# Patient Record
Sex: Female | Born: 1935 | Race: White | Hispanic: No | Marital: Married | Smoking: Never smoker
Health system: Southern US, Community
[De-identification: ages and names within clinical notes are randomized; demographics above are authoritative.]

## PROBLEM LIST (undated history)

## (undated) DIAGNOSIS — E119 Type 2 diabetes mellitus without complications: Secondary | ICD-10-CM

## (undated) DIAGNOSIS — H409 Unspecified glaucoma: Secondary | ICD-10-CM

## (undated) DIAGNOSIS — I519 Heart disease, unspecified: Secondary | ICD-10-CM

## (undated) DIAGNOSIS — E041 Nontoxic single thyroid nodule: Secondary | ICD-10-CM

## (undated) DIAGNOSIS — G459 Transient cerebral ischemic attack, unspecified: Secondary | ICD-10-CM

## (undated) DIAGNOSIS — K219 Gastro-esophageal reflux disease without esophagitis: Secondary | ICD-10-CM

## (undated) DIAGNOSIS — Z9889 Other specified postprocedural states: Secondary | ICD-10-CM

## (undated) DIAGNOSIS — Q211 Atrial septal defect: Secondary | ICD-10-CM

## (undated) DIAGNOSIS — Z8489 Family history of other specified conditions: Secondary | ICD-10-CM

## (undated) DIAGNOSIS — G43909 Migraine, unspecified, not intractable, without status migrainosus: Secondary | ICD-10-CM

## (undated) DIAGNOSIS — R112 Nausea with vomiting, unspecified: Secondary | ICD-10-CM

## (undated) DIAGNOSIS — I639 Cerebral infarction, unspecified: Secondary | ICD-10-CM

## (undated) DIAGNOSIS — I10 Essential (primary) hypertension: Secondary | ICD-10-CM

## (undated) DIAGNOSIS — R413 Other amnesia: Principal | ICD-10-CM

## (undated) DIAGNOSIS — I82409 Acute embolism and thrombosis of unspecified deep veins of unspecified lower extremity: Secondary | ICD-10-CM

## (undated) DIAGNOSIS — M1990 Unspecified osteoarthritis, unspecified site: Secondary | ICD-10-CM

## (undated) DIAGNOSIS — I253 Aneurysm of heart: Secondary | ICD-10-CM

## (undated) DIAGNOSIS — H269 Unspecified cataract: Secondary | ICD-10-CM

## (undated) DIAGNOSIS — E785 Hyperlipidemia, unspecified: Secondary | ICD-10-CM

## (undated) HISTORY — DX: Hyperlipidemia, unspecified: E78.5

## (undated) HISTORY — DX: Aneurysm of heart: I25.3

## (undated) HISTORY — DX: Other amnesia: R41.3

## (undated) HISTORY — PX: LUMBAR DISC SURGERY: SHX700

## (undated) HISTORY — DX: Essential (primary) hypertension: I10

## (undated) HISTORY — PX: BACK SURGERY: SHX140

## (undated) HISTORY — DX: Atrial septal defect: Q21.1

## (undated) HISTORY — PX: TUBAL LIGATION: SHX77

## (undated) HISTORY — PX: TONSILLECTOMY: SUR1361

## (undated) HISTORY — DX: Gastro-esophageal reflux disease without esophagitis: K21.9

## (undated) HISTORY — PX: COLONOSCOPY W/ BIOPSIES AND POLYPECTOMY: SHX1376

## (undated) HISTORY — DX: Transient cerebral ischemic attack, unspecified: G45.9

## (undated) HISTORY — DX: Heart disease, unspecified: I51.9

---

## 1998-02-25 ENCOUNTER — Other Ambulatory Visit: Admission: RE | Admit: 1998-02-25 | Discharge: 1998-02-25 | Payer: Self-pay | Admitting: *Deleted

## 1999-02-08 ENCOUNTER — Other Ambulatory Visit: Admission: RE | Admit: 1999-02-08 | Discharge: 1999-02-08 | Payer: Self-pay | Admitting: *Deleted

## 1999-02-11 ENCOUNTER — Encounter: Payer: Self-pay | Admitting: *Deleted

## 1999-02-11 ENCOUNTER — Ambulatory Visit (HOSPITAL_COMMUNITY): Admission: RE | Admit: 1999-02-11 | Discharge: 1999-02-11 | Payer: Self-pay | Admitting: *Deleted

## 2000-02-21 ENCOUNTER — Encounter: Payer: Self-pay | Admitting: *Deleted

## 2000-02-21 ENCOUNTER — Ambulatory Visit (HOSPITAL_COMMUNITY): Admission: RE | Admit: 2000-02-21 | Discharge: 2000-02-21 | Payer: Self-pay | Admitting: Family Medicine

## 2000-03-21 ENCOUNTER — Encounter: Admission: RE | Admit: 2000-03-21 | Discharge: 2000-06-19 | Payer: Self-pay | Admitting: Family Medicine

## 2001-02-22 ENCOUNTER — Encounter: Payer: Self-pay | Admitting: *Deleted

## 2001-02-22 ENCOUNTER — Ambulatory Visit (HOSPITAL_COMMUNITY): Admission: RE | Admit: 2001-02-22 | Discharge: 2001-02-22 | Payer: Self-pay | Admitting: *Deleted

## 2002-02-19 ENCOUNTER — Emergency Department (HOSPITAL_COMMUNITY): Admission: EM | Admit: 2002-02-19 | Discharge: 2002-02-19 | Payer: Self-pay | Admitting: Emergency Medicine

## 2002-02-19 ENCOUNTER — Encounter: Payer: Self-pay | Admitting: Emergency Medicine

## 2002-03-28 ENCOUNTER — Encounter: Payer: Self-pay | Admitting: *Deleted

## 2002-03-28 ENCOUNTER — Ambulatory Visit (HOSPITAL_COMMUNITY): Admission: RE | Admit: 2002-03-28 | Discharge: 2002-03-28 | Payer: Self-pay | Admitting: *Deleted

## 2002-04-15 ENCOUNTER — Other Ambulatory Visit: Admission: RE | Admit: 2002-04-15 | Discharge: 2002-04-15 | Payer: Self-pay | Admitting: *Deleted

## 2003-06-23 ENCOUNTER — Encounter: Admission: RE | Admit: 2003-06-23 | Discharge: 2003-06-23 | Payer: Self-pay | Admitting: Family Medicine

## 2003-08-25 ENCOUNTER — Other Ambulatory Visit: Admission: RE | Admit: 2003-08-25 | Discharge: 2003-08-25 | Payer: Self-pay | Admitting: Family Medicine

## 2004-05-31 ENCOUNTER — Encounter: Admission: RE | Admit: 2004-05-31 | Discharge: 2004-05-31 | Payer: Self-pay | Admitting: *Deleted

## 2004-10-05 ENCOUNTER — Ambulatory Visit (HOSPITAL_COMMUNITY): Admission: RE | Admit: 2004-10-05 | Discharge: 2004-10-05 | Payer: Self-pay | Admitting: Family Medicine

## 2006-06-12 ENCOUNTER — Ambulatory Visit (HOSPITAL_COMMUNITY): Admission: RE | Admit: 2006-06-12 | Discharge: 2006-06-12 | Payer: Self-pay | Admitting: Family Medicine

## 2007-09-06 ENCOUNTER — Ambulatory Visit (HOSPITAL_COMMUNITY): Admission: RE | Admit: 2007-09-06 | Discharge: 2007-09-06 | Payer: Self-pay | Admitting: Family Medicine

## 2009-03-19 ENCOUNTER — Encounter: Admission: RE | Admit: 2009-03-19 | Discharge: 2009-03-19 | Payer: Self-pay | Admitting: Internal Medicine

## 2009-04-27 ENCOUNTER — Encounter (INDEPENDENT_AMBULATORY_CARE_PROVIDER_SITE_OTHER): Payer: Self-pay | Admitting: Interventional Radiology

## 2009-04-27 ENCOUNTER — Encounter: Admission: RE | Admit: 2009-04-27 | Discharge: 2009-04-27 | Payer: Self-pay | Admitting: Internal Medicine

## 2009-04-27 ENCOUNTER — Other Ambulatory Visit: Admission: RE | Admit: 2009-04-27 | Discharge: 2009-04-27 | Payer: Self-pay | Admitting: Interventional Radiology

## 2009-08-23 ENCOUNTER — Ambulatory Visit (HOSPITAL_COMMUNITY): Admission: RE | Admit: 2009-08-23 | Discharge: 2009-08-23 | Payer: Self-pay | Admitting: Family Medicine

## 2009-09-30 ENCOUNTER — Encounter: Admission: RE | Admit: 2009-09-30 | Discharge: 2009-09-30 | Payer: Self-pay | Admitting: Internal Medicine

## 2009-12-20 ENCOUNTER — Ambulatory Visit: Payer: Self-pay | Admitting: Gastroenterology

## 2010-06-10 ENCOUNTER — Ambulatory Visit: Payer: Self-pay | Admitting: Gastroenterology

## 2010-06-14 ENCOUNTER — Ambulatory Visit: Payer: Self-pay | Admitting: Gastroenterology

## 2010-06-16 LAB — PATHOLOGY REPORT

## 2010-08-17 ENCOUNTER — Encounter
Admission: RE | Admit: 2010-08-17 | Discharge: 2010-08-17 | Payer: Self-pay | Source: Home / Self Care | Attending: Internal Medicine | Admitting: Internal Medicine

## 2010-09-07 ENCOUNTER — Other Ambulatory Visit (HOSPITAL_COMMUNITY): Payer: Self-pay | Admitting: Family Medicine

## 2010-09-07 DIAGNOSIS — Z1231 Encounter for screening mammogram for malignant neoplasm of breast: Secondary | ICD-10-CM

## 2010-09-14 ENCOUNTER — Ambulatory Visit (HOSPITAL_COMMUNITY): Payer: Medicare Other

## 2010-09-21 ENCOUNTER — Other Ambulatory Visit (HOSPITAL_COMMUNITY): Payer: Self-pay | Admitting: Internal Medicine

## 2010-09-21 DIAGNOSIS — E042 Nontoxic multinodular goiter: Secondary | ICD-10-CM

## 2010-09-29 ENCOUNTER — Ambulatory Visit: Payer: Self-pay | Admitting: Gastroenterology

## 2010-09-30 ENCOUNTER — Ambulatory Visit: Payer: Self-pay | Admitting: Gastroenterology

## 2010-10-03 ENCOUNTER — Ambulatory Visit (HOSPITAL_COMMUNITY): Payer: Medicare Other

## 2010-10-04 ENCOUNTER — Other Ambulatory Visit (HOSPITAL_COMMUNITY): Payer: Medicare Other

## 2011-02-15 ENCOUNTER — Other Ambulatory Visit (HOSPITAL_COMMUNITY): Payer: Self-pay | Admitting: Internal Medicine

## 2011-02-15 DIAGNOSIS — E042 Nontoxic multinodular goiter: Secondary | ICD-10-CM

## 2011-02-28 ENCOUNTER — Encounter (HOSPITAL_COMMUNITY)
Admission: RE | Admit: 2011-02-28 | Discharge: 2011-02-28 | Disposition: A | Discharge status: Home / Self Care | Payer: Medicare Other | Source: Ambulatory Visit | Attending: Internal Medicine | Admitting: Internal Medicine

## 2011-02-28 DIAGNOSIS — E042 Nontoxic multinodular goiter: Secondary | ICD-10-CM | POA: Insufficient documentation

## 2011-03-01 ENCOUNTER — Encounter (HOSPITAL_COMMUNITY)
Admission: RE | Admit: 2011-03-01 | Discharge: 2011-03-01 | Disposition: A | Discharge status: Home / Self Care | Payer: Medicare Other | Source: Ambulatory Visit | Attending: Internal Medicine | Admitting: Internal Medicine

## 2011-03-01 MED ORDER — SODIUM IODIDE I 131 CAPSULE
10.0000 | Freq: Once | INTRAVENOUS | Status: AC | PRN
  Administered 2011-03-01: 10 via ORAL

## 2011-03-01 MED ORDER — SODIUM PERTECHNETATE TC 99M INJECTION
10.0000 | Freq: Once | INTRAVENOUS | Status: AC | PRN
  Administered 2011-03-01: 10 via INTRAVENOUS

## 2011-05-11 ENCOUNTER — Emergency Department: Payer: Self-pay | Admitting: *Deleted

## 2011-10-13 ENCOUNTER — Encounter: Payer: Self-pay | Admitting: Cardiovascular Disease

## 2011-10-13 HISTORY — PX: US ECHOCARDIOGRAPHY: HXRAD669

## 2011-10-26 ENCOUNTER — Ambulatory Visit (HOSPITAL_COMMUNITY): Payer: Medicare Other | Attending: Cardiovascular Disease

## 2011-10-26 ENCOUNTER — Encounter: Payer: Self-pay | Admitting: *Deleted

## 2011-10-26 ENCOUNTER — Encounter (HOSPITAL_COMMUNITY): Payer: Self-pay

## 2011-10-26 ENCOUNTER — Other Ambulatory Visit: Payer: Self-pay

## 2011-10-26 ENCOUNTER — Ambulatory Visit (INDEPENDENT_AMBULATORY_CARE_PROVIDER_SITE_OTHER): Payer: Medicare Other | Admitting: Cardiovascular Disease

## 2011-10-26 ENCOUNTER — Other Ambulatory Visit (HOSPITAL_COMMUNITY): Payer: Self-pay | Admitting: Radiology

## 2011-10-26 ENCOUNTER — Encounter (HOSPITAL_COMMUNITY): Payer: Self-pay | Admitting: Cardiovascular Disease

## 2011-10-26 VITALS — BP 142/79 | HR 69 | Ht 65.0 in | Wt 146.8 lb

## 2011-10-26 DIAGNOSIS — Q211 Atrial septal defect: Secondary | ICD-10-CM | POA: Insufficient documentation

## 2011-10-26 DIAGNOSIS — I10 Essential (primary) hypertension: Secondary | ICD-10-CM | POA: Insufficient documentation

## 2011-10-26 DIAGNOSIS — Q2111 Secundum atrial septal defect: Secondary | ICD-10-CM

## 2011-10-26 DIAGNOSIS — E119 Type 2 diabetes mellitus without complications: Secondary | ICD-10-CM | POA: Insufficient documentation

## 2011-10-26 DIAGNOSIS — I253 Aneurysm of heart: Secondary | ICD-10-CM

## 2011-10-26 DIAGNOSIS — R55 Syncope and collapse: Secondary | ICD-10-CM | POA: Insufficient documentation

## 2011-10-26 DIAGNOSIS — E785 Hyperlipidemia, unspecified: Secondary | ICD-10-CM | POA: Insufficient documentation

## 2011-10-26 NOTE — Assessment & Plan Note (Addendum)
I reviewed the patient's echo images. She does have a very hypermobile interatrial septum consistent with an atrial septal aneurysm. There is an association between atrial septal aneurysm and stroke. This has to be considered in the context of the pace at age of 77 years old as well as her concurrent medical problems of diabetes and hypertension, both of which increase her risk of stroke. Fortunately she had no evidence of an embolic event on her MRI. I had the patient undergo a limited echo with bubble study to assess whether there may be intracardiac right-to-left shunting, and the study was positive with early bubbles entering the left heart. Because of this finding, I think the patient should undergo a transesophageal echocardiogram to rule out an atrial septal defect. If she only has a small PFO, I would not plan on any further intervention and would continue with medical therapy. However, she has a significant defect in her interatrial septum, we'll need to consider further treatment. Further plans pending the result of her TEE. I appreciate the opportunity to see this nice patient. I recommended that she continue her current medicines without changes.

## 2011-10-26 NOTE — Progress Notes (Signed)
HPI:  This is a very nice 77 year old woman referred for evaluation of an atrial septal aneurysm. Approximately 2 weeks ago the patient was in her normal state of health until she had an abrupt episode of confusion. She had woken not been taking a shower, then walked downstairs to her daughter's bedroom. That is lasting the patient remembers. Her husband states that the patient was sitting down with unintelligible speech. She did not have focal weakness or clumsiness, nor did she have a facial droop or lose consciousness. She was taken to the hospital and the first thing she remembers is waking up with hospital staff talking to her. She has never had an episode like this in the past. She has no history of stroke or TIA. She reports no visual disturbance. She does complain of a dull left frontal headache ever since the episode occurred. She's had no chest pain, palpitations, dyspnea, lightheadedness, or syncope. The patient has no history of cardiovascular disease, but she does have hypertension and diabetes. An MRI and MRA will was done as part of her evaluation and this showed no significant abnormalities. The patient had a 2-D echocardiogram performed and this demonstrated an atrial septal aneurysm. There were no valvular abnormalities or significant LV dysfunction seen. She presents today for further evaluation of her atrial septal aneurysm considering her recent neurologic event suspicious for a TIA. Of note, the patient has been taking aspirin regularly for many years.  Outpatient Encounter Prescriptions as of 10/26/2011  Medication Sig Dispense Refill  . aspirin 325 MG tablet Take 325 mg by mouth daily.      . Insulin Aspart (NOVOLOG [place]) Inject 12 Units into the skin 3 (three) times daily.      . Insulin Glargine (LANTUS [place]) Inject 25 Units into the skin daily.      Marland Kitchen lisinopril-hydrochlorothiazide (PRINZIDE,ZESTORETIC) 20-25 MG per tablet Take 1 tablet by mouth daily.      . Multiple  Vitamins-Minerals (MULTIVITAMIN PO) Take by mouth daily.      . pantoprazole (PROTONIX) 40 MG tablet Take 40 mg by mouth daily.      . simvastatin (ZOCOR) 40 MG tablet Take 40 mg by mouth every evening.      . ONE TOUCH ULTRA TEST test strip       . POTASSIUM PO Take by mouth daily.        Codeine  Past Medical History  Diagnosis Date  . Transient ischemic attack   . HTN (hypertension)   . Hyperlipidemia   . DM (diabetes mellitus)   . GERD (gastroesophageal reflux disease)     Past Surgical History  Procedure Date  . US echocardiography 10-13-11    EF 60-65% normal  . Back surgery     disc   . Tonsillectomy     History   Social History  . Marital Status: Married    Spouse Name: N/A    Number of Children: N/A  . Years of Education: N/A   Occupational History  . Not on file.   Social History Main Topics  . Smoking status: Never Smoker   . Smokeless tobacco: Not on file  . Alcohol Use:   . Drug Use:   . Sexually Active:    Other Topics Concern  . Not on file   Social History Narrative  . No narrative on file    History reviewed. No pertinent family history.  ROS: General: no fevers/chills/night sweats. Positive for generalized fatigue and positive for high stress  Eyes: no blurry vision, diplopia, or amaurosis ENT: no sore throat or hearing loss Resp: no cough, wheezing, or hemoptysis CV: no edema or palpitations GI: no abdominal pain, nausea, vomiting, diarrhea, or constipation GU: no dysuria, frequency, or hematuria Skin: no rash Neuro: numbness, tingling, or weakness of extremities. Positive for headache Musculoskeletal: no joint pain or swelling Heme: no bleeding, DVT, or easy bruising Endo: no polydipsia or polyuria  BP 142/79  Pulse 69  Ht 5\' 5"  (1.651 m)  Wt 66.588 kg (146 lb 12.8 oz)  BMI 24.43 kg/m2  PHYSICAL EXAM: Pt is alert and oriented, WD, WN, in no distress. HEENT: normal Neck: JVP normal. Carotid upstrokes normal without bruits.  No thyromegaly. Lungs: equal expansion, clear bilaterally CV: Apex is discrete and nondisplaced, RRR without murmur or gallop Abd: soft, NT, +BS, no bruit, no hepatosplenomegaly Back: no CVA tenderness Ext: no C/C/E        Femoral pulses 2+= without bruits        DP/PT pulses intact and = Skin: warm and dry without rash Neuro: CNII-XII intact             Strength intact = bilaterally  EKG:  Normal sinus rhythm heart rate 65 beats per minute, low voltage QRS, otherwise within normal limits.  ASSESSMENT AND PLAN:

## 2011-10-26 NOTE — Progress Notes (Signed)
Patient ID: Christy Simmons, female   DOB: [birth year], 77 y.o.   MRN: [id number] IV 22G (R) hand x 1, tolerated well for Echo Bubble Study. Gratia Disla,RN.

## 2011-10-26 NOTE — Patient Instructions (Signed)
Your physician has requested that you have an echocardiogram. Echocardiography is a painless test that uses sound waves to create images of your heart. It provides your doctor with information about the size and shape of your heart and how well your heart's chambers and valves are working. This procedure takes approximately one hour. There are no restrictions for this procedure.   

## 2011-10-26 NOTE — Progress Notes (Signed)
This encounter was created in error - please disregard.

## 2011-10-30 ENCOUNTER — Encounter (HOSPITAL_COMMUNITY): Payer: Self-pay | Admitting: *Deleted

## 2011-10-30 ENCOUNTER — Ambulatory Visit (HOSPITAL_COMMUNITY)
Admission: RE | Admit: 2011-10-30 | Discharge: 2011-10-30 | Disposition: A | Discharge status: Home / Self Care | Payer: Medicare Other | Source: Ambulatory Visit | Attending: Cardiology | Admitting: Cardiology

## 2011-10-30 ENCOUNTER — Encounter (HOSPITAL_COMMUNITY)
Admission: RE | Disposition: A | Discharge status: Home / Self Care | Payer: Self-pay | Source: Ambulatory Visit | Attending: Cardiology

## 2011-10-30 DIAGNOSIS — E785 Hyperlipidemia, unspecified: Secondary | ICD-10-CM | POA: Insufficient documentation

## 2011-10-30 DIAGNOSIS — G459 Transient cerebral ischemic attack, unspecified: Secondary | ICD-10-CM | POA: Insufficient documentation

## 2011-10-30 DIAGNOSIS — E119 Type 2 diabetes mellitus without complications: Secondary | ICD-10-CM | POA: Insufficient documentation

## 2011-10-30 DIAGNOSIS — I10 Essential (primary) hypertension: Secondary | ICD-10-CM | POA: Insufficient documentation

## 2011-10-30 DIAGNOSIS — I6789 Other cerebrovascular disease: Secondary | ICD-10-CM

## 2011-10-30 DIAGNOSIS — K219 Gastro-esophageal reflux disease without esophagitis: Secondary | ICD-10-CM | POA: Insufficient documentation

## 2011-10-30 HISTORY — PX: TEE WITHOUT CARDIOVERSION: SHX5443

## 2011-10-30 LAB — GLUCOSE, CAPILLARY

## 2011-10-30 SURGERY — ECHOCARDIOGRAM, TRANSESOPHAGEAL
Anesthesia: Moderate Sedation

## 2011-10-30 MED ORDER — FENTANYL CITRATE 0.05 MG/ML IJ SOLN
INTRAMUSCULAR | Status: DC | PRN
  Administered 2011-10-30: 25 ug via INTRAVENOUS

## 2011-10-30 MED ORDER — SODIUM CHLORIDE 0.9 % IV SOLN
250.0000 mL | INTRAVENOUS | Status: DC | PRN
  Administered 2011-10-30: 500 mL via INTRAVENOUS

## 2011-10-30 MED ORDER — FENTANYL CITRATE 0.05 MG/ML IJ SOLN: 250.0000 ug | Freq: Once | INTRAMUSCULAR | Status: DC

## 2011-10-30 MED ORDER — FENTANYL CITRATE 0.05 MG/ML IJ SOLN
INTRAMUSCULAR | Status: AC
  Filled 2011-10-30: qty 2

## 2011-10-30 MED ORDER — SODIUM CHLORIDE 0.9 % IV SOLN: 250.0000 mL | INTRAVENOUS | Status: DC | PRN

## 2011-10-30 MED ORDER — SODIUM CHLORIDE 0.9 % IJ SOLN: 3.0000 mL | Freq: Two times a day (BID) | INTRAMUSCULAR | Status: DC

## 2011-10-30 MED ORDER — SODIUM CHLORIDE 0.9 % IJ SOLN: 3.0000 mL | INTRAMUSCULAR | Status: DC | PRN

## 2011-10-30 MED ORDER — MIDAZOLAM HCL 10 MG/2ML IJ SOLN: 10.0000 mg | Freq: Once | INTRAMUSCULAR | Status: DC

## 2011-10-30 MED ORDER — SODIUM CHLORIDE 0.9 % IJ SOLN
3.0000 mL | Freq: Two times a day (BID) | INTRAMUSCULAR | Status: DC
Start: 2011-10-30 — End: 2011-10-30

## 2011-10-30 MED ORDER — DEXTROSE 50 % IV SOLN
INTRAVENOUS | Status: AC
  Filled 2011-10-30: qty 50

## 2011-10-30 MED ORDER — DEXTROSE 50 % IV SOLN
25.0000 mL | Freq: Once | INTRAVENOUS | Status: AC
  Administered 2011-10-30: 25 mL via INTRAVENOUS

## 2011-10-30 MED ORDER — BENZOCAINE 20 % MT SOLN: 1.0000 "application " | OROMUCOSAL | Status: DC | PRN

## 2011-10-30 MED ORDER — BUTAMBEN-TETRACAINE-BENZOCAINE 2-2-14 % EX AERO
INHALATION_SPRAY | CUTANEOUS | Status: DC | PRN
  Administered 2011-10-30: 2 via TOPICAL

## 2011-10-30 MED ORDER — MIDAZOLAM HCL 10 MG/2ML IJ SOLN
INTRAMUSCULAR | Status: AC
  Filled 2011-10-30: qty 2

## 2011-10-30 MED ORDER — MIDAZOLAM HCL 10 MG/2ML IJ SOLN
INTRAMUSCULAR | Status: DC | PRN
  Administered 2011-10-30: 1 mg via INTRAVENOUS

## 2011-10-30 NOTE — Progress Notes (Signed)
  Echocardiogram Echocardiogram Transesophageal has been performed.  Emelia Loron A 10/30/2011, 3:20 PM

## 2011-10-30 NOTE — H&P (View-Only) (Signed)
HPI:  This is a very nice 77 year old woman referred for evaluation of an atrial septal aneurysm. Approximately 2 weeks ago the patient was in her normal state of health until she had an abrupt episode of confusion. She had woken not been taking a shower, then walked downstairs to her daughter's bedroom. That is lasting the patient remembers. Her husband states that the patient was sitting down with unintelligible speech. She did not have focal weakness or clumsiness, nor did she have a facial droop or lose consciousness. She was taken to the hospital and the first thing she remembers is waking up with hospital staff talking to her. She has never had an episode like this in the past. She has no history of stroke or TIA. She reports no visual disturbance. She does complain of a dull left frontal headache ever since the episode occurred. She's had no chest pain, palpitations, dyspnea, lightheadedness, or syncope. The patient has no history of cardiovascular disease, but she does have hypertension and diabetes. An MRI and MRA will was done as part of her evaluation and this showed no significant abnormalities. The patient had a 2-D echocardiogram performed and this demonstrated an atrial septal aneurysm. There were no valvular abnormalities or significant LV dysfunction seen. She presents today for further evaluation of her atrial septal aneurysm considering her recent neurologic event suspicious for a TIA. Of note, the patient has been taking aspirin regularly for many years.  Outpatient Encounter Prescriptions as of 10/26/2011  Medication Sig Dispense Refill  . aspirin 325 MG tablet Take 325 mg by mouth daily.      . Insulin Aspart (NOVOLOG [place]) Inject 12 Units into the skin 3 (three) times daily.      . Insulin Glargine (LANTUS [place]) Inject 25 Units into the skin daily.      Marland Kitchen lisinopril-hydrochlorothiazide (PRINZIDE,ZESTORETIC) 20-25 MG per tablet Take 1 tablet by mouth daily.      . Multiple  Vitamins-Minerals (MULTIVITAMIN PO) Take by mouth daily.      . pantoprazole (PROTONIX) 40 MG tablet Take 40 mg by mouth daily.      . simvastatin (ZOCOR) 40 MG tablet Take 40 mg by mouth every evening.      . ONE TOUCH ULTRA TEST test strip       . POTASSIUM PO Take by mouth daily.        Codeine  Past Medical History  Diagnosis Date  . Transient ischemic attack   . HTN (hypertension)   . Hyperlipidemia   . DM (diabetes mellitus)   . GERD (gastroesophageal reflux disease)     Past Surgical History  Procedure Date  . US echocardiography 10-13-11    EF 60-65% normal  . Back surgery     disc   . Tonsillectomy     History   Social History  . Marital Status: Married    Spouse Name: N/A    Number of Children: N/A  . Years of Education: N/A   Occupational History  . Not on file.   Social History Main Topics  . Smoking status: Never Smoker   . Smokeless tobacco: Not on file  . Alcohol Use:   . Drug Use:   . Sexually Active:    Other Topics Concern  . Not on file   Social History Narrative  . No narrative on file    History reviewed. No pertinent family history.  ROS: General: no fevers/chills/night sweats. Positive for generalized fatigue and positive for high stress  Eyes: no blurry vision, diplopia, or amaurosis ENT: no sore throat or hearing loss Resp: no cough, wheezing, or hemoptysis CV: no edema or palpitations GI: no abdominal pain, nausea, vomiting, diarrhea, or constipation GU: no dysuria, frequency, or hematuria Skin: no rash Neuro: numbness, tingling, or weakness of extremities. Positive for headache Musculoskeletal: no joint pain or swelling Heme: no bleeding, DVT, or easy bruising Endo: no polydipsia or polyuria  BP 142/79  Pulse 69  Ht 5\' 5"  (1.651 m)  Wt 66.588 kg (146 lb 12.8 oz)  BMI 24.43 kg/m2  PHYSICAL EXAM: Pt is alert and oriented, WD, WN, in no distress. HEENT: normal Neck: JVP normal. Carotid upstrokes normal without bruits.  No thyromegaly. Lungs: equal expansion, clear bilaterally CV: Apex is discrete and nondisplaced, RRR without murmur or gallop Abd: soft, NT, +BS, no bruit, no hepatosplenomegaly Back: no CVA tenderness Ext: no C/C/E        Femoral pulses 2+= without bruits        DP/PT pulses intact and = Skin: warm and dry without rash Neuro: CNII-XII intact             Strength intact = bilaterally  EKG:  Normal sinus rhythm heart rate 65 beats per minute, low voltage QRS, otherwise within normal limits.  ASSESSMENT AND PLAN:

## 2011-10-30 NOTE — Procedures (Signed)
Procedure: TEE  Indication: TIA, positive bubble study on TTE.   Sedation: 1 mg IV Versed, 25 mcg IV Fentanyl.   Findings: See report in echo section for full details.   Normal LV size and systolic function, EF 55-60%.  Normal RV size and systolic function.  There was an atrial septal aneurysm with a moderate to large PFO with bidirectional shunting.    Will discuss with Dr. Excell Seltzer.   No complications.

## 2011-10-30 NOTE — Interval H&P Note (Signed)
History and Physical Interval Note:  10/30/2011 2:29 PM  Christy Simmons  has presented today for surgery, with the diagnosis of positive bubble study in office   The various methods of treatment have been discussed with the patient and family. After consideration of risks, benefits and other options for treatment, the patient has consented to  Procedure(s) (LRB): TRANSESOPHAGEAL ECHOCARDIOGRAM (TEE) (N/A) as a surgical intervention .  The patients' history has been reviewed, patient examined, no change in status, stable for surgery.  I have reviewed the patients' chart and labs.  Questions were answered to the patient's satisfaction.     Danyale Ridinger [employer]

## 2011-10-30 NOTE — Discharge Instructions (Signed)
Transesophageal Echocardiography A transesophageal echocardiogram (TEE) is a special type of test that produces images of the heart by sound waves (echocardiogram). This type of echocardiogram can obtain better images of the heart than a standard echocardiogram. A TEE is done by passing a flexible tube down the esophagus. The heart is located in front of the esophagus. Because the heart and esophagus are close to one another, your caregiver can take very clear, detailed pictures of the heart via ultrasound waves. WHY HAVE A TEE? Your caregiver may need more information based on your medical condition. A TEE is usually performed due to the following:  Your caregiver needs more information based on standard echocardiogram findings.   If you had a stroke, this might have happened because a clot formed in your heart. A TEE can visualize different areas of the heart and check for clots.   To check valve anatomy and function. Your caregiver will especially look at the mitral valve.   To check for redness, soreness, and swelling (inflammation) on the inside lining of the heart (endocarditis).   To evaluate the dividing wall (septum) of the heart and presence of a hole that did not close after birth (patent foramen ovale, PFO).   To help diagnose a tear in the wall of the aorta (aortic dissection).   During cardiac valve surgery, a TEE probe is placed. This allows the surgeon to assess the valve repair before closing the chest.  LET YOUR CAREGIVER KNOW ABOUT:   Swallowing difficulties.   An esophageal obstruction.   Use of aspirin or antiplatelet therapy.  RISKS AND COMPLICATIONS  Though extremely rare, an esophageal tear (rupture) is a potential complication. BEFORE THE PROCEDURE   Arrive at least 1 hour before the procedure or as told by your caregiver.   Do not eat or drink for 6 hours before the procedure or as told by your caregiver.   An intravenous (IV) access tube will be started in  the arm.  PROCEDURE   A medicine to help you relax (sedative) will be given through the IV.   A medicine that numbs the area (local anesthetic) may be sprayed to the back of the throat.   Your blood pressure, heart rate, and breathing (vital signs) will be monitored during the procedure.   The TEE probe is a long, flexible tube. It is about the width of an adult female's index finger. The tip of the probe is placed into the back of the mouth and you will be asked to swallow. This helps to pass the tip of the probe into the esophagus. Once the tip of the probe is in the correct area, your caregiver can take pictures of the heart.   A TEE is usually not a painful procedure. You may feel the probe press against the back of the throat. The probe does not enter the trachea and does not affect your breathing.   Your time spent at the hospital is usually less than 2 hours.  AFTER THE PROCEDURE   You will be in bed, resting until you have fully returned to consciousness.   When you first awaken, your throat may feel slightly sore and will probably still feel numb. This will improve slowly over time.   You will not be allowed to eat or drink until it is clear that numbness has improved.   Once you have been able to drink, urinate, and sit on the edge of the bed without feeling sick to your stomach (nauseous)   or dizzy, you may be cleared to dress and go home.   Do not drive yourself home. You have had medications that can continue to make you feel drowsy and can impair your reflexes.   You should have a friend or family member with you for the next 24 hours after your examination.  Obtaining the test results It is your responsibility to obtain your test results. Ask the lab or department performing the test when and how you will get your results. SEEK IMMEDIATE MEDICAL CARE IF:   There is chest pain.   You have a hard time breathing or have shortness of breath.   You cough or throw up (vomit)  blood.  MAKE SURE YOU:   Understand these instructions.   Will watch this condition.   Will get help right away if you is not doing well or gets worse.  Document Released: 10/07/2002 Document Revised: 07/06/2011 Document Reviewed: 12/29/2008 [employer] Patient Information 2012 [employer]. 

## 2011-10-31 ENCOUNTER — Encounter (HOSPITAL_COMMUNITY): Payer: Self-pay | Admitting: Cardiology

## 2011-11-01 ENCOUNTER — Telehealth: Payer: Self-pay | Admitting: Cardiovascular Disease

## 2011-11-01 NOTE — Telephone Encounter (Signed)
Please return call to patient regarding T.E.E, she can be reached at hm# [phone]

## 2011-11-01 NOTE — Telephone Encounter (Signed)
No answer at home number.

## 2011-11-01 NOTE — Telephone Encounter (Signed)
I reviewed the patient's TEE images. I agree that she has a large PFO with atrial septal aneurysm and bidirectional shunting at rest. I reviewed the results with her on the telephone. She has a strong desire to have the defect closed because of concerns over recurrent stroke risk. I will have her return next week for discussion of transcatheter closure.

## 2011-11-02 NOTE — Telephone Encounter (Signed)
I spoke with the pt and she would like to come into the office on 11/10/11 to discuss closure with Dr Excell Seltzer.

## 2011-11-02 NOTE — Telephone Encounter (Signed)
Left message to call back  

## 2011-11-02 NOTE — Telephone Encounter (Signed)
Fu call °Pt returning your call  °

## 2011-11-10 ENCOUNTER — Ambulatory Visit: Payer: Medicare Other | Admitting: Cardiovascular Disease

## 2011-11-10 ENCOUNTER — Encounter: Payer: Self-pay | Admitting: Cardiovascular Disease

## 2011-11-10 ENCOUNTER — Ambulatory Visit (INDEPENDENT_AMBULATORY_CARE_PROVIDER_SITE_OTHER): Payer: Medicare Other | Admitting: Cardiovascular Disease

## 2011-11-10 VITALS — BP 140/80 | HR 80 | Ht 65.0 in | Wt 150.0 lb

## 2011-11-10 DIAGNOSIS — Q211 Atrial septal defect: Secondary | ICD-10-CM

## 2011-11-10 MED ORDER — CLOPIDOGREL BISULFATE 75 MG PO TABS: 75.0000 mg | ORAL_TABLET | Freq: Every day | ORAL | Status: AC

## 2011-11-10 NOTE — Progress Notes (Signed)
   HPI:  77 year-old woman presents for follow-up evaluation. She had a TIA last month when she had abrupt onset of confusion and headache. As part of her workup, a 2D echo showed an atrial septal aneurysm. This was followed by a echo bubble study which was strongly positive for right-to-left intracardiac shunt. She has previously had a transcranial doppler bubble study which was strongly positive for intracardiac shunt. A TEE was performed on April 1st and showed a large PFO with bidirectional flow and positive bubble study. She returns for further discussion. She has had no further stroke or TIA symptoms. She has no chest pain, dyspnea, or other complaints.  Outpatient Encounter Prescriptions as of 11/10/2011  Medication Sig Dispense Refill  . aspirin 325 MG tablet Take 325 mg by mouth daily.      . Insulin Aspart (NOVOLOG [place]) Inject 12 Units into the skin 3 (three) times daily.      . Insulin Glargine (LANTUS [place]) Inject 25 Units into the skin daily.      Marland Kitchen lisinopril-hydrochlorothiazide (PRINZIDE,ZESTORETIC) 20-25 MG per tablet Take 1 tablet by mouth daily.      . Multiple Vitamins-Minerals (MULTIVITAMIN PO) Take by mouth daily.      . ONE TOUCH ULTRA TEST test strip       . pantoprazole (PROTONIX) 40 MG tablet Take 40 mg by mouth daily.      Marland Kitchen POTASSIUM PO Take by mouth daily.      . simvastatin (ZOCOR) 40 MG tablet Take 40 mg by mouth every evening.      . clopidogrel (PLAVIX) 75 MG tablet Take 1 tablet (75 mg total) by mouth daily.  30 tablet  6    Allergies  Allergen Reactions  . Codeine     Past Medical History  Diagnosis Date  . Transient ischemic attack   . HTN (hypertension)   . Hyperlipidemia   . DM (diabetes mellitus)   . GERD (gastroesophageal reflux disease)     ROS: Negative except as per HPI  BP 140/80  Pulse 80  Ht 5\' 5"  (1.651 m)  Wt 68.04 kg (150 lb)  BMI 24.96 kg/m2  PHYSICAL EXAM: Pt is alert and oriented, NAD HEENT: normal Neck: JVP - normal, carotids  2+= without bruits Lungs: CTA bilaterally CV: RRR without murmur or gallop Abd: soft, NT, Positive BS, no hepatomegaly Ext: no C/C/E, distal pulses intact and equal Skin: warm/dry no rash  ASSESSMENT AND PLAN:

## 2011-11-10 NOTE — Patient Instructions (Signed)
Dr Excell Seltzer has recommended PFO Closure.  Please follow instruction sheet provided.   Your physician recommends that you continue on your current medications as directed. Please refer to the Current Medication list given to you today.

## 2011-11-13 ENCOUNTER — Other Ambulatory Visit: Payer: Self-pay | Admitting: Cardiovascular Disease

## 2011-11-13 NOTE — Assessment & Plan Note (Signed)
I have reviewed the patient's TEE study images in detail. She has a large PFO with both left-to-right and right-to-left flow at rest. The bubble study is strongly positive and there is an associated atrial septal aneurysm. The patient and her family have a strong desire for the defect to be closed. I reviewed the data both for and against transcatheter PFO closure with them in depth, as well as the controversies in this area. They understand the association of PFO and stroke, but also that RCT's have failed to meet their primary endpoints. Despite that, this patient has a large defect and was on antiplatelet Rx with ASA and understandably she doesn't not wish to have a second event before undergoing closure. I believe transcatheter closure is appropriate under these circumstances in a patient who has failed medical therapy. I have reviewed the risks of transcatheter closure of an atrial septal communication including but not limited to infection, bleeding, cardiac perforation, tamponade, emergency surgery, embolization, stroke, MI, late device erosion, and death. She understands and agrees to proceed. She will start plavix prior to the procedure. All questions were answered.

## 2011-11-23 ENCOUNTER — Telehealth: Payer: Self-pay | Admitting: Cardiovascular Disease

## 2011-11-23 NOTE — Telephone Encounter (Signed)
I spoke with the pt and she would like to cancel her PFO closure on 12/04/11.  She said her daughter is in the hosptial and her husband has pneumonia and she feels it is not the right time for procedure.  I have contacted the cath lab and cancelled procedure.

## 2011-11-23 NOTE — Telephone Encounter (Signed)
Pt scheduled for surgery and has some questions

## 2011-11-27 ENCOUNTER — Other Ambulatory Visit: Payer: Medicare Other

## 2011-12-04 ENCOUNTER — Ambulatory Visit (HOSPITAL_COMMUNITY): Admit: 2011-12-04 | Payer: Medicare Other | Admitting: Cardiovascular Disease

## 2011-12-04 ENCOUNTER — Encounter (HOSPITAL_COMMUNITY): Payer: Self-pay

## 2011-12-04 SURGERY — PATENT FORAMEN OVALE CLOSURE
Anesthesia: LOCAL

## 2012-01-06 ENCOUNTER — Emergency Department: Payer: Self-pay | Admitting: *Deleted

## 2012-05-13 ENCOUNTER — Other Ambulatory Visit (HOSPITAL_COMMUNITY): Payer: Self-pay | Admitting: Family Medicine

## 2012-05-13 DIAGNOSIS — Z1231 Encounter for screening mammogram for malignant neoplasm of breast: Secondary | ICD-10-CM

## 2012-06-03 ENCOUNTER — Ambulatory Visit (HOSPITAL_COMMUNITY): Payer: Medicare Other

## 2012-06-11 ENCOUNTER — Ambulatory Visit (HOSPITAL_COMMUNITY)
Admission: RE | Admit: 2012-06-11 | Discharge: 2012-06-11 | Disposition: A | Discharge status: Home / Self Care | Payer: Medicare Other | Source: Ambulatory Visit | Attending: Family Medicine | Admitting: Family Medicine

## 2012-06-11 DIAGNOSIS — Z1231 Encounter for screening mammogram for malignant neoplasm of breast: Secondary | ICD-10-CM | POA: Insufficient documentation

## 2013-01-10 ENCOUNTER — Ambulatory Visit (INDEPENDENT_AMBULATORY_CARE_PROVIDER_SITE_OTHER): Payer: Medicare Other | Admitting: Neurology

## 2013-01-10 ENCOUNTER — Encounter: Payer: Self-pay | Admitting: Neurology

## 2013-01-10 VITALS — BP 130/70 | HR 64 | Ht 64.5 in | Wt 150.0 lb

## 2013-01-10 DIAGNOSIS — R413 Other amnesia: Secondary | ICD-10-CM

## 2013-01-10 HISTORY — DX: Other amnesia: R41.3

## 2013-01-10 NOTE — Progress Notes (Signed)
Reason for visit: Amnesia  Christy Simmons is a 78 y.o. female  History of present illness:  Christy Simmons is a 78 year old right-handed white female with a history of diabetes and hypertension. The patient describes 2 episodes that occurred about one year apart. The first episode occurred in April 2013. The patient was noted to have sudden onset of amnesia, with slurred speech, gait instability, and confusion. This episode lasted 3 hours or so, and fully resolved. The patient has no recollection of any events that occurred during this episode. The patient went to [facility] for an evaluation, and she was apparently found to have an atrial septal aneurysm with a PFO. The patient has been placed on aspirin, and she continues this at this point. The rest of the stroke workup is unavailable to me. The patient had a second event on 12/18/2012. This episode was identical to the first, associated with sudden onset of amnesia, slurred speech, staggering gait. The patient had a blood sugar checked at that time, and it was in the 60s. The patient was taken to the hospital, and she cleared after 4 and one half hours. The patient once again has no recollection of any events during this episode. The patient does not recall any headache, numbness or weakness of the face, arms, or legs. The patient denies any visual disturbance. The patient underwent a CT scan of the brain that was unremarkable. The patient is sent to this office for further evaluation.  Past Medical History  Diagnosis Date  . Transient ischemic attack   . HTN (hypertension)   . Hyperlipidemia   . DM (diabetes mellitus)   . GERD (gastroesophageal reflux disease)   . Heart disease   . Amnesia 01/10/2013  . Patent foramen ovale with atrial septal aneurysm     Past Surgical History  Procedure Laterality Date  . US echocardiography  10-13-11    EF 60-65% normal  . Back surgery      disc   . Tonsillectomy    . Tee without cardioversion   10/30/2011    Procedure: TRANSESOPHAGEAL ECHOCARDIOGRAM (TEE);  Surgeon: Laurey Morale, MD;  Location: [facility] ENDOSCOPY;  Service: Cardiovascular;  Laterality: N/A;    Family History  Problem Relation Age of Onset  . Stroke      No family history of such  . Congenital heart disease      No family history of such  . Anesthesia problems Neg Hx   . Diabetes Maternal Grandfather     Social history:  reports that she has never smoked. She does not have any smokeless tobacco history on file. She reports that she does not drink alcohol or use illicit drugs.  Medications:  Current Outpatient Prescriptions on File Prior to Visit  Medication Sig Dispense Refill  . aspirin 325 MG tablet Take 325 mg by mouth daily.      . Insulin Aspart (NOVOLOG [place]) Inject 12-14 Units into the skin 3 (three) times daily with meals. Pt is on a sliding scale      . Insulin Glargine (LANTUS [place]) Inject 25 Units into the skin at bedtime.       Marland Kitchen lisinopril-hydrochlorothiazide (PRINZIDE,ZESTORETIC) 20-25 MG per tablet Take 1 tablet by mouth daily.      . Multiple Vitamin (MULITIVITAMIN WITH MINERALS) TABS Take 1 tablet by mouth daily.      . simvastatin (ZOCOR) 40 MG tablet Take 40 mg by mouth at bedtime.        No  current facility-administered medications on file prior to visit.    Allergies:  Allergies  Allergen Reactions  . Codeine     Headache    ROS:  Out of a complete 14 system review of symptoms, the patient complains only of the following symptoms, and all other reviewed systems are negative.  Blurred vision Moles Shortness of breath Increased thirst Joint pain, achy muscles Amnesia, occasional headache, weakness, decreased sleep, decreased energy Insomnia, restless legs  Blood pressure 130/70, pulse 64, height 5' 4.5" (1.638 m), weight 150 lb (68.04 kg).  Physical Exam  General: The patient is alert and cooperative at the time of the examination.  Head: Pupils are equal, round, and reactive  to light. Discs are flat bilaterally.  Neck: The neck is supple, no carotid bruits are noted.  Respiratory: The respiratory examination is clear.  Cardiovascular: The cardiovascular examination reveals a regular rate and rhythm, no obvious murmurs or rubs are noted.  Skin: Extremities are without significant edema.  Neurologic Exam  Mental status:  Cranial nerves: Facial symmetry is present. There is good sensation of the face to pinprick and soft touch bilaterally. The strength of the facial muscles and the muscles to head turning and shoulder shrug are normal bilaterally. Speech is well enunciated, no aphasia or dysarthria is noted. Extraocular movements are full. Visual fields are full.  Motor: The motor testing reveals 5 over 5 strength of all 4 extremities. Good symmetric motor tone is noted throughout.  Sensory: Sensory testing is intact to pinprick, soft touch, vibration sensation, and position sense on all 4 extremities. No evidence of extinction is noted.  Coordination: Cerebellar testing reveals good finger-nose-finger and heel-to-shin bilaterally.  Gait and station: Gait is normal. Tandem gait is slightly unsteady. Romberg is negative. No drift is seen.  Reflexes: Deep tendon reflexes are symmetric and normal bilaterally, with the exception that the ankle jerk reflexes are depressed. Toes are downgoing bilaterally.   Assessment/Plan:  1. History of diabetes  2. Hypertension  3. Episodes of amnesia, altered sensorium   The events of amnesia are not consistent with transient global amnesia, and they would be unusual for a true TIA event. An episode of hypoglycemia could cause these events. The patient should be evaluated as well for seizures. The patient will be set up for MRI evaluation the brain, MRA of the head, and carotid Doppler study. An EEG study will be done. If the events recur, the possibility of treating empirically with antiepileptic medications will be  considered. The blood sugar during the event in May of 2014 was low, in the 60s.  Christy Palau MD 01/10/2013 5:52 PM  [facility] [address] [place], [place] [address]  Phone [phone] Fax [phone]

## 2013-01-15 ENCOUNTER — Ambulatory Visit (INDEPENDENT_AMBULATORY_CARE_PROVIDER_SITE_OTHER): Payer: Medicare Other | Admitting: Radiology

## 2013-01-15 ENCOUNTER — Telehealth: Payer: Self-pay | Admitting: Neurology

## 2013-01-15 DIAGNOSIS — R413 Other amnesia: Secondary | ICD-10-CM

## 2013-01-15 NOTE — Telephone Encounter (Signed)
I called patient. The EEG study was unremarkable. MRI of the brain, MRA, carotid Doppler study are all pending.

## 2013-01-15 NOTE — Procedures (Signed)
  History:  Christy Simmons is a 78 year old patient with a history of diabetes and hypertension. The patient has had at least 2 episodes one year apart associated with sudden onset of amnesia, slurred speech, gait instability, and confusion. The episodes last 3 or 4 hours, and fully resolve. The patient is being evaluated for possible seizures.  This is a routine EEG. No skull defects are noted. Medications include aspirin, insulin, Prinzide, multivitamins, and Zocor.  EEG classification: Normal awake and asleep  Description of the recording: The background rhythms of this recording consists of a fairly well modulated medium amplitude background activity of 8 Hz. As the record progresses, the patient initially is in the waking state, but appears to enter the early stage II sleep during the recording, with rudimentary sleep spindles and vertex sharp wave activity seen. During the wakeful state, photic stimulation is performed, and this results in a bilateral and symmetric photic driving response. Hyperventilation was not performed.  At no time during the recording does there appear to be evidence of spike or spike wave discharges or evidence of focal slowing. EKG monitor shows no evidence of cardiac rhythm abnormalities with a heart rate of 72.  Impression: This is a normal EEG recording in the waking and sleeping state. No evidence of ictal or interictal discharges were seen at any time during the recording.

## 2013-01-21 ENCOUNTER — Ambulatory Visit (INDEPENDENT_AMBULATORY_CARE_PROVIDER_SITE_OTHER): Payer: Medicare Other

## 2013-01-21 DIAGNOSIS — R413 Other amnesia: Secondary | ICD-10-CM

## 2013-01-21 DIAGNOSIS — R55 Syncope and collapse: Secondary | ICD-10-CM

## 2013-01-27 ENCOUNTER — Telehealth: Payer: Self-pay | Admitting: Neurology

## 2013-01-27 ENCOUNTER — Ambulatory Visit
Admission: RE | Admit: 2013-01-27 | Discharge: 2013-01-27 | Disposition: A | Discharge status: Home / Self Care | Payer: Medicare Other | Source: Ambulatory Visit | Attending: Neurology | Admitting: Neurology

## 2013-01-27 DIAGNOSIS — R413 Other amnesia: Secondary | ICD-10-CM

## 2013-01-27 NOTE — Telephone Encounter (Signed)
I called patient. The carotid Doppler study is unremarkable. The MRI of the brain was done today, the results are pending.

## 2013-01-29 ENCOUNTER — Telehealth: Payer: Self-pay | Admitting: Neurology

## 2013-01-29 NOTE — Telephone Encounter (Signed)
I called the patient. The MRI the brain shows minimal small vessel disease. MRA of the head is completely normal. The patient continues to have episodes of amnesia, and the blood sugar is not documented below, I would initiate treatment with an anticonvulsant medication.

## 2013-02-06 ENCOUNTER — Telehealth: Payer: Self-pay | Admitting: Neurology

## 2013-02-06 NOTE — Telephone Encounter (Signed)
MRI of the brain results have shown up in my box again, but the patient has already been called. I did not call the patient today.

## 2013-04-15 ENCOUNTER — Ambulatory Visit: Payer: Self-pay | Admitting: Orthopedic Surgery

## 2013-05-05 ENCOUNTER — Other Ambulatory Visit (HOSPITAL_COMMUNITY): Payer: Self-pay | Admitting: Family Medicine

## 2013-05-05 DIAGNOSIS — Z1231 Encounter for screening mammogram for malignant neoplasm of breast: Secondary | ICD-10-CM

## 2013-06-13 ENCOUNTER — Ambulatory Visit (HOSPITAL_COMMUNITY): Payer: Medicare Other

## 2013-06-16 ENCOUNTER — Ambulatory Visit (INDEPENDENT_AMBULATORY_CARE_PROVIDER_SITE_OTHER): Payer: Medicare Other | Admitting: Neurology

## 2013-06-16 ENCOUNTER — Telehealth: Payer: Self-pay | Admitting: Neurology

## 2013-06-16 ENCOUNTER — Encounter: Payer: Self-pay | Admitting: Neurology

## 2013-06-16 VITALS — BP 160/82 | HR 78 | Wt 135.0 lb

## 2013-06-16 DIAGNOSIS — R413 Other amnesia: Secondary | ICD-10-CM

## 2013-06-16 MED ORDER — LEVETIRACETAM 250 MG PO TABS: 250.0000 mg | ORAL_TABLET | Freq: Two times a day (BID) | ORAL | Status: DC

## 2013-06-16 NOTE — Progress Notes (Signed)
Reason for visit: Amnesia  Christy Simmons is an 78 y.o. female  History of present illness:  Christy Simmons is a 78 year old right-handed white female with a history of episodes of transient global amnesia. The patient has had 2 prior events, and she was seen through this office in June of 2014 for an event that occurred on 12/11/2012. The patient had a prior event in April 2013. A cerebrovascular workup was unremarkable, but the patient was found to have a patent foramen ovale. The patient had a normal EEG study, and cerebrovascular circulation studies were unremarkable. The patient has had another event that occurred yesterday. The patient recalls nothing from 8:30 PM until 11:30 PM. During that period time, the patient ran water in the bathtub, and the water overflowed. The patient apparently got out of her nightgown, and got into her pajamas, and she recalls nothing of this. The patient appeared to be talking and walking about normally. The patient denied any headache associated with this event, but she did have a mild headache earlier that day. The patient reports no focal numbness or weakness of the face, arms, or legs. The blood sugars were 200 just before she went to bed around 8:30 PM. The patient had an epidural steroid injection in the low back a week prior, and she had been taking Ultram for pain. The patient returns to this office for an evaluation. At this point, the patient feels back to baseline.  Past Medical History  Diagnosis Date  . Transient ischemic attack   . HTN (hypertension)   . Hyperlipidemia   . DM (diabetes mellitus)   . GERD (gastroesophageal reflux disease)   . Heart disease   . Amnesia 01/10/2013  . Patent foramen ovale with atrial septal aneurysm     Past Surgical History  Procedure Laterality Date  . US echocardiography  10-13-11    EF 60-65% normal  . Back surgery      disc   . Tonsillectomy    . Tee without cardioversion  10/30/2011    Procedure:  TRANSESOPHAGEAL ECHOCARDIOGRAM (TEE);  Surgeon: Laurey Morale, MD;  Location: [facility] ENDOSCOPY;  Service: Cardiovascular;  Laterality: N/A;    Family History  Problem Relation Age of Onset  . Stroke      No family history of such  . Congenital heart disease      No family history of such  . Anesthesia problems Neg Hx   . Diabetes Maternal Grandfather     Social history:  reports that she has never smoked. She has never used smokeless tobacco. She reports that she does not drink alcohol or use illicit drugs.    Allergies  Allergen Reactions  . Codeine     Headache    Medications:  Current Outpatient Prescriptions on File Prior to Visit  Medication Sig Dispense Refill  . aspirin 325 MG tablet Take 325 mg by mouth daily.      . brimonidine (ALPHAGAN) 0.2 % ophthalmic solution Apply 0.2 drops to eye as needed.      . Insulin Aspart (NOVOLOG [place]) Inject 12-14 Units into the skin 3 (three) times daily with meals. Pt is on a sliding scale      . Insulin Glargine (LANTUS [place]) Inject 25 Units into the skin at bedtime.       Marland Kitchen lisinopril-hydrochlorothiazide (PRINZIDE,ZESTORETIC) 20-25 MG per tablet Take 1 tablet by mouth daily.      . Multiple Vitamin (MULITIVITAMIN WITH MINERALS) TABS Take 1 tablet  by mouth daily.       No current facility-administered medications on file prior to visit.    ROS:  Out of a complete 14 system review of symptoms, the patient complains only of the following symptoms, and all other reviewed systems are negative.  Weight loss Transient global amnesia Low back pain  Blood pressure 160/82, pulse 78, weight 135 lb (61.236 kg).  Physical Exam  General: The patient is alert and cooperative at the time of the examination.  Skin: No significant peripheral edema is noted.   Neurologic Exam  Mental status: The Mini-Mental status examination done today shows a total score of 28/30.  Cranial nerves: Facial symmetry is present. Speech is normal, no aphasia  or dysarthria is noted. Extraocular movements are full. Visual fields are full.  Motor: The patient has good strength in all 4 extremities.  Sensory examination: Soft touch sensation on the face, arms, and legs is symmetric.  Coordination: The patient has good finger-nose-finger and heel-to-shin bilaterally.  Gait and station: The patient has a normal gait. Tandem gait is slightly unsteady. Romberg is negative. No drift is seen.  Reflexes: Deep tendon reflexes are symmetric.   Assessment/Plan:  1. Transient global amnesia  The patient has had 3 similar events of transient amnesia lasting several hours. The last event was associated with a blood sugar of 60, but this most recent event started shortly after she had checked her blood sugars, and they were 200. The possibility of seizures needs to be entertained. The patient will have a repeat EEG study, and she will be placed on low-dose Keppra. The patient will followup in 6 months.  Christy Palau MD 06/16/2013 8:57 PM  [facility] [address] [place], [place] [address]  Phone [phone] Fax [phone]

## 2013-06-16 NOTE — Patient Instructions (Signed)
Transient Global Amnesia Your exam shows you may have a rare problem that causes temporary amnesia, an inability to remember what has happened in the past several hours or day. Transient global amnesia (TGA) means you cannot remember recent events, even though you may look and act normally. There are no physical problems in TGA; your vision, strength, coordination, and sensations are all normal. TGA occurs most often in older patients, and in patients with high blood pressure. The exact cause of TGA is not known, although it is thought to be due to vascular disease in your brain. There is usually a complete return to normal memory capacity after an episode is over. About 20-30% of patients with TGA will have more than one episode, and some studies show a slight increased risk for stroke. Although no special treatment is needed, taking up to one adult aspirin daily reduces the risk of having a stroke. You should consider taking aspirin daily if you are not allergic to it. Medical evaluation may require specialized scans to check for stroke or other brain problems, an EEG (brain wave test), or blood tests. Avoid alcohol or any sedating medicines until you are completely recovered. Call your doctor right away if your memory is not fully recovered after 24 hours, or if you have any other serious problems including:  Severe headache, nausea, vomiting, fever, or other symptoms of an infection.  Weakness, numbness, difficulty with movement, or incoordination.  Blurred or double vision, unusual sleepiness, seizures, or fainting. Document Released: 08/24/2004 Document Revised: 10/09/2011 Document Reviewed: 07/17/2005 [facility] Patient Information 2014 [place], [place]. Transient Global Amnesia Your exam shows you may have a rare problem that causes temporary amnesia, an inability to remember what has happened in the past several hours or day. Transient global amnesia (TGA) means you cannot remember recent events,  even though you may look and act normally. There are no physical problems in TGA; your vision, strength, coordination, and sensations are all normal. TGA occurs most often in older patients, and in patients with high blood pressure. The exact cause of TGA is not known, although it is thought to be due to vascular disease in your brain. There is usually a complete return to normal memory capacity after an episode is over. About 20-30% of patients with TGA will have more than one episode, and some studies show a slight increased risk for stroke. Although no special treatment is needed, taking up to one adult aspirin daily reduces the risk of having a stroke. You should consider taking aspirin daily if you are not allergic to it. Medical evaluation may require specialized scans to check for stroke or other brain problems, an EEG (brain wave test), or blood tests. Avoid alcohol or any sedating medicines until you are completely recovered. Call your doctor right away if your memory is not fully recovered after 24 hours, or if you have any other serious problems including:  Severe headache, nausea, vomiting, fever, or other symptoms of an infection.  Weakness, numbness, difficulty with movement, or incoordination.  Blurred or double vision, unusual sleepiness, seizures, or fainting. Document Released: 08/24/2004 Document Revised: 10/09/2011 Document Reviewed: 07/17/2005 [facility] Patient Information 2014 [place], [place].

## 2013-06-17 ENCOUNTER — Ambulatory Visit (INDEPENDENT_AMBULATORY_CARE_PROVIDER_SITE_OTHER): Payer: Medicare Other

## 2013-06-17 DIAGNOSIS — R413 Other amnesia: Secondary | ICD-10-CM

## 2013-06-17 NOTE — Procedures (Signed)
    History:  Christy Simmons is a 78 year old patient with 3 separate episodes of transient global amnesia. Last such event occurred on 06/15/2013. The patient is being evaluated for possible seizures as an etiology for the events.  This is a routine EEG. No skull defects are noted. Medications include aspirin, insulin, lisinopril, hydrochlorothiazide, tramadol and multivitamins.   EEG classification: Normal awake and asleep  Description of the recording: The background rhythms of this recording consists of a fairly well modulated medium amplitude background activity of 8 Hz. As the record progresses, the patient initially is in the waking state, but appears to enter the early stage II sleep during the recording, with rudimentary sleep spindles and vertex sharp wave activity seen. During the wakeful state, photic stimulation is performed, and this results in a bilateral and symmetric photic driving response. Hyperventilation was also performed, and this results in a minimal buildup of the background rhythm activities without significant slowing seen. At no time during the recording does there appear to be evidence of spike or spike wave discharges or evidence of focal slowing. EKG monitor shows no evidence of cardiac rhythm abnormalities with a heart rate of 60.  Impression: This is a normal EEG recording in the waking and sleeping state. No evidence of ictal or interictal discharges were seen at any time during the recording.

## 2013-06-17 NOTE — Telephone Encounter (Signed)
I called the patient. The EEG study was unremarkable. The patient will start Keppra empirically, we will follow her over the next several months on this medication.

## 2013-07-22 ENCOUNTER — Telehealth: Payer: Self-pay | Admitting: Neurology

## 2013-07-22 NOTE — Telephone Encounter (Signed)
Spoke to patient and she started Keppra 250mg  twice a day about 3 weeks ago.  She has just seen the neurosurgeon and she will need back surgery, and they advised her to come off the seizure medication before surgery.  She would like to know how to wean off.  [phone]

## 2013-07-22 NOTE — Telephone Encounter (Signed)
The patient will be having back surgery in the near future. Apparently, her neurosurgeon start her on gabapentin, and one of her come off of the Keppra. I'm not sure why she has come off of Keppra, but the use of Keppra is empiric for the episodes of transient global amnesia. The patient will go to 1 tablet daily for a week, and then stop the medication. The patient could go back on Keppra 1 she is off of the gabapentin. Gabapentin is a seizure medication, but it is not a very potent seizure medication.

## 2013-07-23 ENCOUNTER — Ambulatory Visit: Payer: Medicare Other | Admitting: Neurology

## 2013-08-11 ENCOUNTER — Ambulatory Visit: Payer: Medicare Other | Admitting: Neurology

## 2013-08-12 ENCOUNTER — Encounter (HOSPITAL_COMMUNITY): Payer: Self-pay | Admitting: Pharmacy Technician

## 2013-08-12 ENCOUNTER — Other Ambulatory Visit: Payer: Self-pay | Admitting: Neurosurgery

## 2013-08-14 ENCOUNTER — Encounter (HOSPITAL_COMMUNITY)
Admission: RE | Admit: 2013-08-14 | Discharge: 2013-08-14 | Disposition: A | Discharge status: Home / Self Care | Payer: Medicare Other | Source: Ambulatory Visit | Attending: Neurosurgery | Admitting: Neurosurgery

## 2013-08-14 ENCOUNTER — Encounter (HOSPITAL_COMMUNITY): Payer: Self-pay

## 2013-08-14 ENCOUNTER — Encounter (HOSPITAL_COMMUNITY)
Admission: RE | Admit: 2013-08-14 | Discharge: 2013-08-14 | Disposition: A | Discharge status: Home / Self Care | Payer: Medicare Other | Source: Ambulatory Visit | Attending: Anesthesiology | Admitting: Anesthesiology

## 2013-08-14 DIAGNOSIS — Z01812 Encounter for preprocedural laboratory examination: Secondary | ICD-10-CM | POA: Insufficient documentation

## 2013-08-14 DIAGNOSIS — Z01818 Encounter for other preprocedural examination: Secondary | ICD-10-CM | POA: Insufficient documentation

## 2013-08-14 HISTORY — DX: Family history of other specified conditions: Z84.89

## 2013-08-14 HISTORY — DX: Unspecified glaucoma: H40.9

## 2013-08-14 HISTORY — DX: Other specified postprocedural states: R11.2

## 2013-08-14 HISTORY — DX: Unspecified cataract: H26.9

## 2013-08-14 HISTORY — DX: Other specified postprocedural states: Z98.890

## 2013-08-14 HISTORY — DX: Unspecified osteoarthritis, unspecified site: M19.90

## 2013-08-14 LAB — CBC
HCT: 38.2 % (ref 36.0–46.0)
Hemoglobin: 12.9 g/dL (ref 12.0–15.0)
MCH: 30.2 pg (ref 26.0–34.0)
MCHC: 33.8 g/dL (ref 30.0–36.0)
MCV: 89.5 fL (ref 78.0–100.0)
PLATELETS: 262 10*3/uL (ref 150–400)
RBC: 4.27 MIL/uL (ref 3.87–5.11)
RDW: 12.9 % (ref 11.5–15.5)
WBC: 9.6 10*3/uL (ref 4.0–10.5)

## 2013-08-14 LAB — BASIC METABOLIC PANEL
BUN: 20 mg/dL (ref 6–23)
CALCIUM: 9.7 mg/dL (ref 8.4–10.5)
CO2: 29 mEq/L (ref 19–32)
CREATININE: 0.92 mg/dL (ref 0.50–1.10)
Chloride: 98 mEq/L (ref 96–112)
GFR calc non Af Amer: 59 mL/min — ABNORMAL LOW (ref >90)
GFR, EST AFRICAN AMERICAN: 68 mL/min — AB (ref >90)
Glucose, Bld: 285 mg/dL — ABNORMAL HIGH (ref 70–99)
Potassium: 4.3 mEq/L (ref 3.7–5.3)
Sodium: 138 mEq/L (ref 137–147)

## 2013-08-14 LAB — SURGICAL PCR SCREEN
MRSA, PCR: NEGATIVE
STAPHYLOCOCCUS AUREUS: NEGATIVE

## 2013-08-14 NOTE — Progress Notes (Signed)
Pt denies SOB. Chest pain and being under the care of a cardiologist. 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 Pt PCP is Dr. Cyndi Bender, MD of [facility]. A request was made for recent EKG, any cardiac studies and latest office notes.

## 2013-08-14 NOTE — Pre-Procedure Instructions (Signed)
LASHAYLA ARMES  08/14/2013   Your procedure is scheduled on:  Monday, August 18, 2013 @ 1:00 PM  Report to [facility] Short Stay (use Main Entrance "A'') at 10:00AM.  Call this number if you have problems the morning of surgery: [phone]   Remember:   Do not eat food or drink liquids after midnight.   Take these medicines the morning of surgery with A SIP OF WATER: levETIRAcetam (KEPPRA) 250 MG tablet, brimonidine (ALPHAGAN) 0.2 % ophthalmic solution if needed: HYDROcodone-acetaminophen (NORCO/VICODIN) 5-325 MG per tablet  for moderate pain. Stop taking Aspirin, vitamins and herbal medications. Do not take any NSAIDs ie: Ibuprofen, Advil, Naproxen or any medication containing Aspirin.  Do not wear jewelry, make-up or nail polish.  Do not wear lotions, powders, or perfumes. You may wear deodorant.  Do not shave 48 hours prior to surgery.  Do not bring valuables to the hospital.  [facility] is not responsible for any belongings or valuables.               Contacts, dentures or bridgework may not be worn into surgery.  Leave suitcase in the car. After surgery it may be brought to your room.  For patients admitted to the hospital, discharge time is determined by your treatment team.               Patients discharged the day of surgery will not be allowed to drive home.  Name and phone number of your driver:   Special Instructions: Shower using CHG 2 nights before surgery and the night before surgery.  If you shower the day of surgery use CHG.  Use special wash - you have one bottle of CHG for all showers.  You should use approximately 1/3 of the bottle for each shower.   Please read over the following fact sheets that you were given: Pain Booklet, Coughing and Deep Breathing, MRSA Information and Surgical Site Infection Prevention

## 2013-08-15 ENCOUNTER — Encounter (HOSPITAL_COMMUNITY): Payer: Self-pay | Admitting: Vascular Surgery

## 2013-08-15 ENCOUNTER — Encounter (HOSPITAL_COMMUNITY): Payer: Self-pay

## 2013-08-15 ENCOUNTER — Telehealth: Payer: Self-pay | Admitting: Cardiovascular Disease

## 2013-08-15 NOTE — Progress Notes (Signed)
Anesthesia Chart Review:  Patient is a 79 year old female scheduled for L4-5 laminectomy with redo L5-S1 diskectomy on 08/18/13 by Dr. Arnoldo Morale.  History includes TIA '13 with work-up revealing an atrial septal aneurysm with moderate to large PFO (saw Dr. Burt Knack for transcatheter closure, but she ultimately declined), three episodes of transient global amnesia with last event 06/15/13 (normal EEG; being followed by neurologist Dr. Floyde Parkins), HLD, HTN, GERD, DM on insulin, glaucoma, arthritis, non-smoker, post-operative N/V, her brother had trouble waking up after anesthesia. PCP is Cyndi Bender, PA-C.  Her most recent EKG was requested from [facility], but is still pending.   TEE on 10/30/11 showed: Normal LV size and systolic function, EF 19-37%. Normal RV size and systolic function. There was an atrial septal aneurysm with a moderate to large PFO with bidirectional shunting. Trivial MR.  Carotid duplex on 01/21/13 was negative for hemodynamically significant extracranial carotid artery stenosis.  CXR on 08/14/13 showed no active cardiopulmonary disease.  Preoperative labs noted.  Non-fasting glucose 285.  CBC WNL.    I reviewed above with anesthesiologists Dr. Ola Spurr and Dr. Oletta Lamas.  Both agreed that it was be best for patient to get re-evaluated perioperatively by cardiology regarding management of PFO.  With three neurologic events since 2013, with last one being three months ago, perhaps patient would be more likely to reconsider her decision for repair--especially now with her wanting back surgery which could further increase her risk for CVA. I have notified Manuela Schwartz at Dr. Arnoldo Morale' office about need for cardiology evaluation and about patient's hyperglycemia.  She will discuss with Dr. Arnoldo Morale.  George Hugh [facility] Stay [facility]/Anesthesiology Phone [phone] 08/15/2013 11:26 AM

## 2013-08-15 NOTE — Telephone Encounter (Signed)
Pt called because she states needs back surgery and because the cardiologist had recommended for pt to have PFO closure in 2013 pt decided not to have it . The back surgery scheduled for next week has been cancelled because DR. Cooper need to re-evaluate pt. Pt has an appointment for 10/02/13 at 3:30 Pm pt would like to have an earlier appointment.

## 2013-08-15 NOTE — Telephone Encounter (Signed)
New problem    Pt need to speak to nurse concerning her having sx and she need to see dr cooper b/f sx. Please call pt

## 2013-08-15 NOTE — Telephone Encounter (Deleted)
Error

## 2013-08-18 ENCOUNTER — Encounter (HOSPITAL_COMMUNITY): Admission: RE | Payer: Self-pay | Source: Ambulatory Visit

## 2013-08-18 ENCOUNTER — Inpatient Hospital Stay (HOSPITAL_COMMUNITY): Admission: RE | Admit: 2013-08-18 | Payer: Medicare Other | Source: Ambulatory Visit | Admitting: Neurosurgery

## 2013-08-18 SURGERY — LUMBAR LAMINECTOMY/DECOMPRESSION MICRODISCECTOMY 2 LEVELS
Anesthesia: General

## 2013-08-29 ENCOUNTER — Encounter: Payer: Self-pay | Admitting: Nurse Practitioner

## 2013-08-29 ENCOUNTER — Ambulatory Visit (INDEPENDENT_AMBULATORY_CARE_PROVIDER_SITE_OTHER): Payer: Medicare Other | Admitting: Nurse Practitioner

## 2013-08-29 VITALS — BP 130/72 | HR 64 | Ht 66.0 in | Wt 133.8 lb

## 2013-08-29 DIAGNOSIS — Z01818 Encounter for other preprocedural examination: Secondary | ICD-10-CM

## 2013-08-29 NOTE — Patient Instructions (Addendum)
Stay on your current medicines but get back on your aspirin post op  We will send a note to Dr. Arnoldo Morale and Dr. Jannifer Franklin - you may proceed with having your surgery with our recommendations  We will see you back as needed  Call the [place] office at [phone] if you have any questions, problems or concerns.

## 2013-08-29 NOTE — Progress Notes (Signed)
Marylouise Stacks Date of Birth: 06-May-1935 Medical Record #[id number]  History of Present Illness: Ms. Lipsey is seen back today for a pre op visit. Seen for Dr. Burt Knack. She has a known atrial septal aneurysm - with bubble study in the past showing a strongly positive right to left intracardiac shunt and prior TEE showing a large PFO with bidirectional flow. She has had prior TIA - on Plavix. Closure was recommended back in 2013 but the patient decided to not proceed. Her other issues include DM, HTN, HLD and GERD.  She has not been seen back in this office since 2013.   Called earlier this month - wanting pre op clearance for back surgery but this was cancelled due to her cardiac issue by anesthesia. Thus added to my schedule for today.   Comes in today. Here alone. Doing ok. No chest pain. Not short of breath. Says she has been diagnosed with transient global amnesia. Given Keppra but now off due to surgery. Also off of her aspirin for surgery. Her surgery has been cancelled. No actual known stroke. Very limited by her back pain - "can't do anything". Does not wish to have her PFO closed.    Current Outpatient Prescriptions  Medication Sig Dispense Refill  . brimonidine (ALPHAGAN) 0.2 % ophthalmic solution Place 1 drop into both eyes 3 (three) times daily.       Marland Kitchen HYDROcodone-acetaminophen (NORCO/VICODIN) 5-325 MG per tablet Take 1 tablet by mouth every 6 (six) hours as needed for moderate pain.      Marland Kitchen insulin aspart (NOVOLOG) 100 UNIT/ML injection Inject 12-14 Units into the skin 2 (two) times daily. Per sliding scale *takes in am and at noon*      . insulin glargine (LANTUS) 100 UNIT/ML injection Inject 20 Units into the skin every morning.      . levETIRAcetam (KEPPRA) 250 MG tablet Take 250 mg by mouth 2 (two) times daily.      Marland Kitchen lisinopril-hydrochlorothiazide (PRINZIDE,ZESTORETIC) 20-25 MG per tablet Take 1 tablet by mouth daily.      . Multiple Vitamin (MULITIVITAMIN WITH MINERALS) TABS Take  1 tablet by mouth daily.      . traMADol (ULTRAM) 50 MG tablet        No current facility-administered medications for this visit.    Allergies  Allergen Reactions  . Gabapentin     Caused depression  . Codeine     Headache    Past Medical History  Diagnosis Date  . Transient ischemic attack   . HTN (hypertension)   . Hyperlipidemia   . DM (diabetes mellitus)   . GERD (gastroesophageal reflux disease)   . Heart disease     PFO  . Amnesia 01/10/2013  . Patent foramen ovale with atrial septal aneurysm   . Complication of anesthesia   . PONV (postoperative nausea and vomiting)   . Family history of anesthesia complication     "My brother has trouble waking up."  . Glaucoma     Hx: of  . Early cataracts, bilateral     Hx: of  . Arthritis     Past Surgical History  Procedure Laterality Date  . US echocardiography  10-13-11    EF 60-65% normal  . Back surgery      disc   . Tonsillectomy    . Tee without cardioversion  10/30/2011    Procedure: TRANSESOPHAGEAL ECHOCARDIOGRAM (TEE);  Surgeon: Larey Dresser, MD;  Location: [place];  Service: Cardiovascular;  Laterality:  N/A;  . Colonoscopy w/ biopsies and polypectomy      HX;of  . Tonsillectomy    . Tubal ligation      History  Smoking status  . Never Smoker   Smokeless tobacco  . Never Used    History  Alcohol Use No    Family History  Problem Relation Age of Onset  . Stroke      No family history of such  . Congenital heart disease      No family history of such  . Anesthesia problems Neg Hx   . Diabetes Maternal Grandfather     Review of Systems: The review of systems is per the HPI.  All other systems were reviewed and are negative.  Physical Exam: BP 130/72  Pulse 64  Ht 5\' 6"  (1.676 m)  Wt 133 lb 12.8 oz (60.691 kg)  BMI 21.61 kg/m2 Patient is very pleasant and in no acute distress. Skin is warm and dry. Color is normal.  HEENT is unremarkable. Normocephalic/atraumatic. PERRL. Sclera are  nonicteric. Neck is supple. No masses. No JVD. Lungs are clear. Cardiac exam shows a regular rate and rhythm. Abdomen is soft. Extremities are without edema. Gait and ROM are intact. No gross neurologic deficits noted.  LABORATORY DATA:  EKG today shows sinus rhythm today. Reviewed with Dr. Burt Knack today.   Lab Results  Component Value Date   WBC 9.6 08/14/2013   HGB 12.9 08/14/2013   HCT 38.2 08/14/2013   PLT 262 08/14/2013   GLUCOSE 285* 08/14/2013   NA 138 08/14/2013   K 4.3 08/14/2013   CL 98 08/14/2013   CREATININE 0.92 08/14/2013   BUN 20 08/14/2013   CO2 29 08/14/2013   Echo Study Conclusions from 2013  - Left ventricle: The cavity size was normal. There was mild concentric hypertrophy. Systolic function was normal. The estimated ejection fraction was in the range of 60% to 65%. Wall motion was normal; there were no regional wall motion abnormalities. - Aortic valve: There was no stenosis. - Mitral valve: Trivial regurgitation. - Left atrium: The atrium was mildly dilated. No evidence of thrombus in the atrial cavity or appendage. - Right atrium: No evidence of thrombus in the atrial cavity or appendage. - Atrial septum: Atrial septal aneurysm with moderate to large PFO. Positive bubble study.   Assessment / Plan:  1. Known large PFO - has declined prior closure back in 2013 - still does not wish to pursue. She has been seen by Dr. Burt Knack today. She has had no actual embolic event documented. There is valid concern warranted in light of her PFO for a DVT. She will need to have aggressive DVT prophylaxis post op. She is given the ok to proceed with her surgery. We will be available as needed. Have also asked her to resume her aspirin post op.   2. Back surgery - need for preop clearance - ok to proceed with aggressive DVT prophylaxis post op per Dr. Burt Knack.   3. HTN  4. HLD  5. Transient global amnesia - followed by Dr. Jannifer Franklin  Patient is agreeable to this plan and will call  if any problems develop in the interim.   Burtis Junes, RN, [place] [address] [place], [place]  [phone]

## 2013-09-02 ENCOUNTER — Other Ambulatory Visit: Payer: Self-pay | Admitting: Neurosurgery

## 2013-09-03 NOTE — Progress Notes (Signed)
Anesthesia follow-up:  See my note from 08/14/13.  Surgery was postponed at that time to allow for cardiology re-evaluation for her ASD/moderate to large PFO found during a work-up for TIA in 2013.  Patient has since been evaluated at [facility] by Truitt Merle, NP with Dr. Burt Knack on 08/29/13.  She still does not wish to proceed with PFO closure.  She was evaluated for three episodes of transient global amnesia (last documented 06/15/13) but apparently no definitive embolic event.  Aggressive post-operative DVT prophylaxis and resuming ASA post-operatively were recommended.  I have updated anesthesiologist Dr. Glennon Mac.  Patient will be at increased risk for perioperative CVA/DVT, but at this point declines PFO closure.  She is scheduled for PAT on 09/08/13 and will get repeat labs at that time.  Surgery has been rescheduled for 09/11/13.  George Hugh [facility]/Anesthesiology Phone [phone] 09/03/2013 1:38 PM

## 2013-09-05 ENCOUNTER — Encounter (HOSPITAL_COMMUNITY): Payer: Self-pay | Admitting: Pharmacy Technician

## 2013-09-05 NOTE — Pre-Procedure Instructions (Addendum)
Christy Simmons  09/05/2013   Your procedure is scheduled on:  09/11/13  Report to [facility] short stay admitting at 3 AM.  Call this number if you have problems the morning of surgery: [phone]   Remember:   Do not eat food or drink liquids after midnight.   Take these medicines the morning of surgery with A SIP OF WATER: eye drops,pain med if needed       STOP all herbel meds, nsaids (aleve,naproxen,advil,ibuprofen) now including vitamins ,aspirin     NO INSULIN AM OF SURGERY   Do not wear jewelry, make-up or nail polish.  Do not wear lotions, powders, or perfumes. You may wear deodorant.  Do not shave 48 hours prior to surgery. Men may shave face and neck.  Do not bring valuables to the hospital.  [facility] is not responsible                  for any belongings or valuables.               Contacts, dentures or bridgework may not be worn into surgery.  Leave suitcase in the car. After surgery it may be brought to your room.  For patients admitted to the hospital, discharge time is determined by your                treatment team.               Patients discharged the day of surgery will not be allowed to drive  home.  Name and phone number of your driver:   Special Instructions:  Special Instructions: [place] - Preparing for Surgery  Before surgery, you can play an important role.  Because skin is not sterile, your skin needs to be as free of germs as possible.  You can reduce the number of germs on you skin by washing with CHG (chlorahexidine gluconate) soap before surgery.  CHG is an antiseptic cleaner which kills germs and bonds with the skin to continue killing germs even after washing.  Please DO NOT use if you have an allergy to CHG or antibacterial soaps.  If your skin becomes reddened/irritated stop using the CHG and inform your nurse when you arrive at Short Stay.  Do not shave (including legs and underarms) for at least 48 hours prior to the first CHG shower.  You  may shave your face.  Please follow these instructions carefully:   1.  Shower with CHG Soap the night before surgery and the morning of Surgery.  2.  If you choose to wash your hair, wash your hair first as usual with your normal shampoo.  3.  After you shampoo, rinse your hair and body thoroughly to remove the Shampoo.  4.  Use CHG as you would any other liquid soap.  You can apply chg directly  to the skin and wash gently with scrungie or a clean washcloth.  5.  Apply the CHG Soap to your body ONLY FROM THE NECK DOWN.  Do not use on open wounds or open sores.  Avoid contact with your eyes ears, mouth and genitals (private parts).  Wash genitals (private parts)       with your normal soap.  6.  Wash thoroughly, paying special attention to the area where your surgery will be performed.  7.  Thoroughly rinse your body with warm water from the neck down.  8.  DO NOT shower/wash with your normal soap after using  and rinsing off the CHG Soap.  9.  Pat yourself dry with a clean towel.            10.  Wear clean pajamas.            11.  Place clean sheets on your bed the night of your first shower and do not sleep with pets.  Day of Surgery  Do not apply any lotions/deodorants the morning of surgery.  Please wear clean clothes to the hospital/surgery center.   Please read over the following fact sheets that you were given: Pain Booklet, Coughing and Deep Breathing, MRSA Information and Surgical Site Infection Prevention

## 2013-09-08 ENCOUNTER — Encounter (HOSPITAL_COMMUNITY): Payer: Self-pay

## 2013-09-08 ENCOUNTER — Encounter (HOSPITAL_COMMUNITY)
Admission: RE | Admit: 2013-09-08 | Discharge: 2013-09-08 | Disposition: A | Discharge status: Home / Self Care | Payer: Medicare Other | Source: Ambulatory Visit | Attending: Neurosurgery | Admitting: Neurosurgery

## 2013-09-08 ENCOUNTER — Other Ambulatory Visit (HOSPITAL_COMMUNITY): Payer: Self-pay | Admitting: *Deleted

## 2013-09-08 LAB — BASIC METABOLIC PANEL
BUN: 19 mg/dL (ref 6–23)
CHLORIDE: 100 meq/L (ref 96–112)
CO2: 29 mEq/L (ref 19–32)
CREATININE: 0.98 mg/dL (ref 0.50–1.10)
Calcium: 9.7 mg/dL (ref 8.4–10.5)
GFR calc non Af Amer: 54 mL/min — ABNORMAL LOW (ref >90)
GFR, EST AFRICAN AMERICAN: 63 mL/min — AB (ref >90)
GLUCOSE: 222 mg/dL — AB (ref 70–99)
POTASSIUM: 4.2 meq/L (ref 3.7–5.3)
Sodium: 139 mEq/L (ref 137–147)

## 2013-09-08 LAB — CBC
HEMATOCRIT: 36.3 % (ref 36.0–46.0)
HEMOGLOBIN: 11.9 g/dL — AB (ref 12.0–15.0)
MCH: 29.3 pg (ref 26.0–34.0)
MCHC: 32.8 g/dL (ref 30.0–36.0)
MCV: 89.4 fL (ref 78.0–100.0)
Platelets: 229 10*3/uL (ref 150–400)
RBC: 4.06 MIL/uL (ref 3.87–5.11)
RDW: 12.7 % (ref 11.5–15.5)
WBC: 12.9 10*3/uL — AB (ref 4.0–10.5)

## 2013-09-08 MED ORDER — CEFAZOLIN SODIUM-DEXTROSE 2-3 GM-% IV SOLR: 2.0000 g | INTRAVENOUS | Status: DC

## 2013-09-10 MED ORDER — CEFAZOLIN SODIUM-DEXTROSE 2-3 GM-% IV SOLR
2.0000 g | INTRAVENOUS | Status: AC
  Administered 2013-09-11: 2 g via INTRAVENOUS
  Filled 2013-09-10: qty 50

## 2013-09-11 ENCOUNTER — Encounter (HOSPITAL_COMMUNITY): Payer: Medicare Other | Admitting: Vascular Surgery

## 2013-09-11 ENCOUNTER — Ambulatory Visit (HOSPITAL_COMMUNITY): Payer: Medicare Other

## 2013-09-11 ENCOUNTER — Encounter (HOSPITAL_COMMUNITY)
Admission: RE | Disposition: A | Discharge status: Home / Self Care | Payer: Self-pay | Source: Ambulatory Visit | Attending: Neurosurgery

## 2013-09-11 ENCOUNTER — Inpatient Hospital Stay (HOSPITAL_COMMUNITY)
Admission: RE | Admit: 2013-09-11 | Discharge: 2013-09-13 | DRG: 519 | Disposition: A | Discharge status: Home / Self Care | Payer: Medicare Other | Source: Ambulatory Visit | Attending: Neurosurgery | Admitting: Neurosurgery

## 2013-09-11 ENCOUNTER — Ambulatory Visit (HOSPITAL_COMMUNITY): Payer: Medicare Other | Admitting: Anesthesiology

## 2013-09-11 ENCOUNTER — Encounter (HOSPITAL_COMMUNITY): Payer: Self-pay | Admitting: Anesthesiology

## 2013-09-11 DIAGNOSIS — M5126 Other intervertebral disc displacement, lumbar region: Secondary | ICD-10-CM | POA: Diagnosis present

## 2013-09-11 DIAGNOSIS — M48062 Spinal stenosis, lumbar region with neurogenic claudication: Secondary | ICD-10-CM | POA: Diagnosis present

## 2013-09-11 DIAGNOSIS — Z8673 Personal history of transient ischemic attack (TIA), and cerebral infarction without residual deficits: Secondary | ICD-10-CM

## 2013-09-11 DIAGNOSIS — R339 Retention of urine, unspecified: Secondary | ICD-10-CM | POA: Diagnosis present

## 2013-09-11 DIAGNOSIS — Z79899 Other long term (current) drug therapy: Secondary | ICD-10-CM

## 2013-09-11 DIAGNOSIS — I10 Essential (primary) hypertension: Secondary | ICD-10-CM | POA: Diagnosis present

## 2013-09-11 DIAGNOSIS — Z794 Long term (current) use of insulin: Secondary | ICD-10-CM

## 2013-09-11 DIAGNOSIS — E119 Type 2 diabetes mellitus without complications: Secondary | ICD-10-CM | POA: Diagnosis present

## 2013-09-11 DIAGNOSIS — Q211 Atrial septal defect: Secondary | ICD-10-CM

## 2013-09-11 DIAGNOSIS — Z01812 Encounter for preprocedural laboratory examination: Secondary | ICD-10-CM

## 2013-09-11 DIAGNOSIS — E785 Hyperlipidemia, unspecified: Secondary | ICD-10-CM | POA: Diagnosis present

## 2013-09-11 DIAGNOSIS — K219 Gastro-esophageal reflux disease without esophagitis: Secondary | ICD-10-CM | POA: Diagnosis present

## 2013-09-11 DIAGNOSIS — Z7982 Long term (current) use of aspirin: Secondary | ICD-10-CM

## 2013-09-11 DIAGNOSIS — Q2111 Secundum atrial septal defect: Secondary | ICD-10-CM

## 2013-09-11 HISTORY — PX: LUMBAR LAMINECTOMY/DECOMPRESSION MICRODISCECTOMY: SHX5026

## 2013-09-11 LAB — GLUCOSE, CAPILLARY
GLUCOSE-CAPILLARY: 175 mg/dL — AB (ref 70–99)
Glucose-Capillary: 171 mg/dL — ABNORMAL HIGH (ref 70–99)
Glucose-Capillary: 198 mg/dL — ABNORMAL HIGH (ref 70–99)

## 2013-09-11 SURGERY — LUMBAR LAMINECTOMY/DECOMPRESSION MICRODISCECTOMY 2 LEVELS
Anesthesia: General | Site: Back

## 2013-09-11 MED ORDER — HYDROMORPHONE HCL PF 1 MG/ML IJ SOLN
INTRAMUSCULAR | Status: AC
  Administered 2013-09-11 (×2): 0.5 mg
  Filled 2013-09-11: qty 1

## 2013-09-11 MED ORDER — DIAZEPAM 5 MG PO TABS
ORAL_TABLET | ORAL | Status: AC
  Administered 2013-09-11: 5 mg via ORAL
  Filled 2013-09-11: qty 1

## 2013-09-11 MED ORDER — OXYCODONE-ACETAMINOPHEN 5-325 MG PO TABS
ORAL_TABLET | ORAL | Status: AC
  Administered 2013-09-11: 2 via ORAL
  Filled 2013-09-11: qty 2

## 2013-09-11 MED ORDER — OXYCODONE HCL 5 MG PO TABS: 5.0000 mg | ORAL_TABLET | Freq: Once | ORAL | Status: DC | PRN

## 2013-09-11 MED ORDER — ACETAMINOPHEN 325 MG PO TABS: 650.0000 mg | ORAL_TABLET | ORAL | Status: DC | PRN

## 2013-09-11 MED ORDER — OXYCODONE-ACETAMINOPHEN 5-325 MG PO TABS
1.0000 | ORAL_TABLET | ORAL | Status: DC | PRN
  Administered 2013-09-11 – 2013-09-13 (×7): 2 via ORAL
  Filled 2013-09-11 (×7): qty 2

## 2013-09-11 MED ORDER — ADULT MULTIVITAMIN W/MINERALS CH
1.0000 | ORAL_TABLET | Freq: Every day | ORAL | Status: DC
  Administered 2013-09-11 – 2013-09-13 (×3): 1 via ORAL
  Filled 2013-09-11 (×3): qty 1

## 2013-09-11 MED ORDER — OXYCODONE HCL 5 MG/5ML PO SOLN: 5.0000 mg | Freq: Once | ORAL | Status: DC | PRN

## 2013-09-11 MED ORDER — BRIMONIDINE TARTRATE 0.2 % OP SOLN
1.0000 [drp] | Freq: Three times a day (TID) | OPHTHALMIC | Status: DC
  Administered 2013-09-11 – 2013-09-13 (×6): 1 [drp] via OPHTHALMIC
  Filled 2013-09-11: qty 5

## 2013-09-11 MED ORDER — LACTATED RINGERS IV SOLN
INTRAVENOUS | Status: DC
  Administered 2013-09-11: 17:00:00 via INTRAVENOUS

## 2013-09-11 MED ORDER — LACTATED RINGERS IV SOLN
INTRAVENOUS | Status: DC | PRN
  Administered 2013-09-11 (×2): via INTRAVENOUS

## 2013-09-11 MED ORDER — LIDOCAINE HCL (CARDIAC) 20 MG/ML IV SOLN
INTRAVENOUS | Status: DC | PRN
  Administered 2013-09-11: 100 mg via INTRAVENOUS

## 2013-09-11 MED ORDER — HYDROMORPHONE HCL PF 1 MG/ML IJ SOLN: 0.2500 mg | INTRAMUSCULAR | Status: DC | PRN

## 2013-09-11 MED ORDER — GLYCOPYRROLATE 0.2 MG/ML IJ SOLN
INTRAMUSCULAR | Status: DC | PRN
  Administered 2013-09-11: 0.6 mg via INTRAVENOUS

## 2013-09-11 MED ORDER — PHENOL 1.4 % MT LIQD: 1.0000 | OROMUCOSAL | Status: DC | PRN

## 2013-09-11 MED ORDER — LIDOCAINE HCL (CARDIAC) 20 MG/ML IV SOLN
INTRAVENOUS | Status: AC
  Filled 2013-09-11: qty 5

## 2013-09-11 MED ORDER — ONDANSETRON HCL 4 MG/2ML IJ SOLN: 4.0000 mg | INTRAMUSCULAR | Status: DC | PRN

## 2013-09-11 MED ORDER — EPHEDRINE SULFATE 50 MG/ML IJ SOLN
INTRAMUSCULAR | Status: AC
  Filled 2013-09-11: qty 1

## 2013-09-11 MED ORDER — FENTANYL CITRATE 0.05 MG/ML IJ SOLN
INTRAMUSCULAR | Status: AC
  Filled 2013-09-11: qty 5

## 2013-09-11 MED ORDER — MENTHOL 3 MG MT LOZG: 1.0000 | LOZENGE | OROMUCOSAL | Status: DC | PRN

## 2013-09-11 MED ORDER — ROCURONIUM BROMIDE 100 MG/10ML IV SOLN
INTRAVENOUS | Status: DC | PRN
  Administered 2013-09-11: 50 mg via INTRAVENOUS

## 2013-09-11 MED ORDER — GLYCOPYRROLATE 0.2 MG/ML IJ SOLN: INTRAMUSCULAR | Status: DC | PRN

## 2013-09-11 MED ORDER — PROPOFOL 10 MG/ML IV BOLUS
INTRAVENOUS | Status: AC
  Filled 2013-09-11: qty 20

## 2013-09-11 MED ORDER — HYDROCODONE-ACETAMINOPHEN 5-325 MG PO TABS
1.0000 | ORAL_TABLET | ORAL | Status: DC | PRN
  Administered 2013-09-13: 1 via ORAL
  Filled 2013-09-11: qty 1

## 2013-09-11 MED ORDER — CEFAZOLIN SODIUM-DEXTROSE 2-3 GM-% IV SOLR
2.0000 g | Freq: Three times a day (TID) | INTRAVENOUS | Status: AC
  Administered 2013-09-11 – 2013-09-12 (×2): 2 g via INTRAVENOUS
  Filled 2013-09-11 (×2): qty 50

## 2013-09-11 MED ORDER — NEOSTIGMINE METHYLSULFATE 1 MG/ML IJ SOLN
INTRAMUSCULAR | Status: DC | PRN
  Administered 2013-09-11: 3 mg via INTRAVENOUS

## 2013-09-11 MED ORDER — LACTATED RINGERS IV SOLN
INTRAVENOUS | Status: DC
  Administered 2013-09-11 – 2013-09-12 (×2): via INTRAVENOUS

## 2013-09-11 MED ORDER — BACITRACIN ZINC 500 UNIT/GM EX OINT
TOPICAL_OINTMENT | CUTANEOUS | Status: DC | PRN
  Administered 2013-09-11: 1 via TOPICAL

## 2013-09-11 MED ORDER — ONDANSETRON HCL 4 MG/2ML IJ SOLN: 4.0000 mg | Freq: Once | INTRAMUSCULAR | Status: DC | PRN

## 2013-09-11 MED ORDER — PHENYLEPHRINE 40 MCG/ML (10ML) SYRINGE FOR IV PUSH (FOR BLOOD PRESSURE SUPPORT)
PREFILLED_SYRINGE | INTRAVENOUS | Status: AC
  Filled 2013-09-11: qty 10

## 2013-09-11 MED ORDER — EPHEDRINE SULFATE 50 MG/ML IJ SOLN
INTRAMUSCULAR | Status: DC | PRN
  Administered 2013-09-11: 10 mg via INTRAVENOUS

## 2013-09-11 MED ORDER — ROCURONIUM BROMIDE 50 MG/5ML IV SOLN
INTRAVENOUS | Status: AC
  Filled 2013-09-11: qty 1

## 2013-09-11 MED ORDER — HYDROCHLOROTHIAZIDE 25 MG PO TABS
25.0000 mg | ORAL_TABLET | Freq: Every day | ORAL | Status: DC
  Administered 2013-09-12 – 2013-09-13 (×2): 25 mg via ORAL
  Filled 2013-09-11 (×2): qty 1

## 2013-09-11 MED ORDER — ALUM & MAG HYDROXIDE-SIMETH 200-200-20 MG/5ML PO SUSP: 30.0000 mL | Freq: Four times a day (QID) | ORAL | Status: DC | PRN

## 2013-09-11 MED ORDER — PHENYLEPHRINE HCL 10 MG/ML IJ SOLN
INTRAMUSCULAR | Status: DC | PRN
  Administered 2013-09-11: 120 ug via INTRAVENOUS

## 2013-09-11 MED ORDER — STERILE WATER FOR INJECTION IJ SOLN
INTRAMUSCULAR | Status: AC
  Filled 2013-09-11: qty 10

## 2013-09-11 MED ORDER — PHENYLEPHRINE HCL 10 MG/ML IJ SOLN
10.0000 mg | INTRAVENOUS | Status: DC | PRN
  Administered 2013-09-11: 25 ug/min via INTRAVENOUS

## 2013-09-11 MED ORDER — ONDANSETRON HCL 4 MG/2ML IJ SOLN
INTRAMUSCULAR | Status: DC | PRN
  Administered 2013-09-11 (×2): 4 mg via INTRAVENOUS

## 2013-09-11 MED ORDER — GLYCOPYRROLATE 0.2 MG/ML IJ SOLN
INTRAMUSCULAR | Status: AC
  Filled 2013-09-11: qty 3

## 2013-09-11 MED ORDER — DIAZEPAM 5 MG PO TABS
5.0000 mg | ORAL_TABLET | Freq: Four times a day (QID) | ORAL | Status: DC | PRN
  Administered 2013-09-11: 5 mg via ORAL
  Filled 2013-09-11: qty 1

## 2013-09-11 MED ORDER — DEXAMETHASONE SODIUM PHOSPHATE 10 MG/ML IJ SOLN: INTRAMUSCULAR | Status: DC | PRN

## 2013-09-11 MED ORDER — HEMOSTATIC AGENTS (NO CHARGE) OPTIME
TOPICAL | Status: DC | PRN
  Administered 2013-09-11: 1 via TOPICAL

## 2013-09-11 MED ORDER — SODIUM CHLORIDE 0.9 % IR SOLN
Status: DC | PRN
  Administered 2013-09-11: 13:00:00

## 2013-09-11 MED ORDER — LISINOPRIL 20 MG PO TABS
20.0000 mg | ORAL_TABLET | Freq: Every day | ORAL | Status: DC
  Administered 2013-09-12 – 2013-09-13 (×2): 20 mg via ORAL
  Filled 2013-09-11 (×2): qty 1

## 2013-09-11 MED ORDER — LISINOPRIL-HYDROCHLOROTHIAZIDE 20-25 MG PO TABS: 1.0000 | ORAL_TABLET | Freq: Every day | ORAL | Status: DC

## 2013-09-11 MED ORDER — THROMBIN 5000 UNITS EX SOLR
CUTANEOUS | Status: DC | PRN
  Administered 2013-09-11 (×2): 5000 [IU] via TOPICAL

## 2013-09-11 MED ORDER — 0.9 % SODIUM CHLORIDE (POUR BTL) OPTIME
TOPICAL | Status: DC | PRN
  Administered 2013-09-11: 1000 mL

## 2013-09-11 MED ORDER — ACETAMINOPHEN 650 MG RE SUPP: 650.0000 mg | RECTAL | Status: DC | PRN

## 2013-09-11 MED ORDER — FENTANYL CITRATE 0.05 MG/ML IJ SOLN
INTRAMUSCULAR | Status: DC | PRN
  Administered 2013-09-11: 50 ug via INTRAVENOUS
  Administered 2013-09-11 (×2): 100 ug via INTRAVENOUS

## 2013-09-11 MED ORDER — DOCUSATE SODIUM 100 MG PO CAPS
100.0000 mg | ORAL_CAPSULE | Freq: Two times a day (BID) | ORAL | Status: DC
  Administered 2013-09-11 – 2013-09-13 (×4): 100 mg via ORAL
  Filled 2013-09-11 (×4): qty 1

## 2013-09-11 MED ORDER — BUPIVACAINE-EPINEPHRINE PF 0.5-1:200000 % IJ SOLN
INTRAMUSCULAR | Status: DC | PRN
  Administered 2013-09-11: 10 mL via PERINEURAL

## 2013-09-11 MED ORDER — PROPOFOL 10 MG/ML IV BOLUS
INTRAVENOUS | Status: DC | PRN
  Administered 2013-09-11: 120 mg via INTRAVENOUS

## 2013-09-11 MED ORDER — INSULIN ASPART 100 UNIT/ML ~~LOC~~ SOLN
0.0000 [IU] | SUBCUTANEOUS | Status: DC
  Administered 2013-09-12: 15 [IU] via SUBCUTANEOUS
  Administered 2013-09-12: 4 [IU] via SUBCUTANEOUS
  Administered 2013-09-13: 7 [IU] via SUBCUTANEOUS
  Administered 2013-09-13: 4 [IU] via SUBCUTANEOUS

## 2013-09-11 MED ORDER — MEPERIDINE HCL 25 MG/ML IJ SOLN: 6.2500 mg | INTRAMUSCULAR | Status: DC | PRN

## 2013-09-11 MED ORDER — MORPHINE SULFATE 2 MG/ML IJ SOLN
1.0000 mg | INTRAMUSCULAR | Status: DC | PRN
  Administered 2013-09-12: 2 mg via INTRAVENOUS
  Administered 2013-09-12: 1 mg via INTRAVENOUS
  Filled 2013-09-11 (×2): qty 1

## 2013-09-11 SURGICAL SUPPLY — 64 items
APL SKNCLS STERI-STRIP NONHPOA (GAUZE/BANDAGES/DRESSINGS) ×1
APL SRG 60D 8 XTD TIP BNDBL (TIP) ×1
BAG DECANTER FOR FLEXI CONT (MISCELLANEOUS) ×3 IMPLANT
BENZOIN TINCTURE PRP APPL 2/3 (GAUZE/BANDAGES/DRESSINGS) ×3 IMPLANT
BLADE SURG ROTATE 9660 (MISCELLANEOUS) IMPLANT
BRUSH SCRUB EZ PLAIN DRY (MISCELLANEOUS) ×3 IMPLANT
BUR ACORN 6.0 (BURR) ×2 IMPLANT
BUR ACORN 6.0MM (BURR) ×1
BUR MATCHSTICK NEURO 3.0 LAGG (BURR) ×3 IMPLANT
CANISTER SUCT 3000ML (MISCELLANEOUS) ×3 IMPLANT
CLOSURE WOUND 1/2 X4 (GAUZE/BANDAGES/DRESSINGS) ×1
CONT SPEC 4OZ CLIKSEAL STRL BL (MISCELLANEOUS) ×3 IMPLANT
DRAPE LAPAROTOMY 100X72X124 (DRAPES) ×3 IMPLANT
DRAPE MICROSCOPE LEICA (MISCELLANEOUS) ×3 IMPLANT
DRAPE POUCH INSTRU U-SHP 10X18 (DRAPES) ×3 IMPLANT
DRAPE SURG 17X23 STRL (DRAPES) ×12 IMPLANT
DURASEAL APPLICATOR TIP (TIP) ×2 IMPLANT
DURASEAL SPINE SEALANT 3ML (MISCELLANEOUS) ×2 IMPLANT
ELECT BLADE 4.0 EZ CLEAN MEGAD (MISCELLANEOUS) ×3
ELECT REM PT RETURN 9FT ADLT (ELECTROSURGICAL) ×3
ELECTRODE BLDE 4.0 EZ CLN MEGD (MISCELLANEOUS) ×1 IMPLANT
ELECTRODE REM PT RTRN 9FT ADLT (ELECTROSURGICAL) ×1 IMPLANT
GAUZE SPONGE 4X4 16PLY XRAY LF (GAUZE/BANDAGES/DRESSINGS) IMPLANT
GLOVE BIO SURGEON STRL SZ8.5 (GLOVE) ×3 IMPLANT
GLOVE ECLIPSE 7.5 STRL STRAW (GLOVE) ×2 IMPLANT
GLOVE EXAM NITRILE LRG STRL (GLOVE) ×2 IMPLANT
GLOVE EXAM NITRILE MD LF STRL (GLOVE) IMPLANT
GLOVE EXAM NITRILE XL STR (GLOVE) IMPLANT
GLOVE EXAM NITRILE XS STR PU (GLOVE) IMPLANT
GLOVE INDICATOR 7.5 STRL GRN (GLOVE) ×2 IMPLANT
GLOVE INDICATOR 8.5 STRL (GLOVE) ×2 IMPLANT
GLOVE SS BIOGEL STRL SZ 8 (GLOVE) ×1 IMPLANT
GLOVE SUPERSENSE BIOGEL SZ 8 (GLOVE) ×2
GLOVE SURG SS PI 7.0 STRL IVOR (GLOVE) ×4 IMPLANT
GOWN BRE IMP SLV AUR LG STRL (GOWN DISPOSABLE) IMPLANT
GOWN BRE IMP SLV AUR XL STRL (GOWN DISPOSABLE) ×1 IMPLANT
GOWN STRL REIN 2XL LVL4 (GOWN DISPOSABLE) IMPLANT
GOWN STRL REUS W/ TWL LRG LVL3 (GOWN DISPOSABLE) IMPLANT
GOWN STRL REUS W/ TWL XL LVL3 (GOWN DISPOSABLE) IMPLANT
GOWN STRL REUS W/TWL LRG LVL3 (GOWN DISPOSABLE) ×6
GOWN STRL REUS W/TWL XL LVL3 (GOWN DISPOSABLE) ×3
KIT BASIN OR (CUSTOM PROCEDURE TRAY) ×3 IMPLANT
KIT ROOM TURNOVER OR (KITS) ×3 IMPLANT
NDL HYPO 21X1.5 SAFETY (NEEDLE) IMPLANT
NEEDLE HYPO 21X1.5 SAFETY (NEEDLE) IMPLANT
NEEDLE HYPO 22GX1.5 SAFETY (NEEDLE) ×3 IMPLANT
NS IRRIG 1000ML POUR BTL (IV SOLUTION) ×3 IMPLANT
PACK LAMINECTOMY NEURO (CUSTOM PROCEDURE TRAY) ×3 IMPLANT
PAD ARMBOARD 7.5X6 YLW CONV (MISCELLANEOUS) ×9 IMPLANT
PATTIES SURGICAL .5 X1 (DISPOSABLE) IMPLANT
RUBBERBAND STERILE (MISCELLANEOUS) ×6 IMPLANT
SPONGE GAUZE 4X4 12PLY (GAUZE/BANDAGES/DRESSINGS) ×3 IMPLANT
SPONGE SURGIFOAM ABS GEL SZ50 (HEMOSTASIS) ×3 IMPLANT
STRIP CLOSURE SKIN 1/2X4 (GAUZE/BANDAGES/DRESSINGS) ×2 IMPLANT
SUT PROLENE 6 0 BV (SUTURE) ×6 IMPLANT
SUT VIC AB 1 CT1 18XBRD ANBCTR (SUTURE) ×2 IMPLANT
SUT VIC AB 1 CT1 8-18 (SUTURE) ×6
SUT VIC AB 2-0 CP2 18 (SUTURE) ×6 IMPLANT
SYR 20CC LL (SYRINGE) IMPLANT
SYR 20ML ECCENTRIC (SYRINGE) ×3 IMPLANT
TAPE CLOTH SURG 4X10 WHT LF (GAUZE/BANDAGES/DRESSINGS) ×2 IMPLANT
TOWEL OR 17X24 6PK STRL BLUE (TOWEL DISPOSABLE) ×3 IMPLANT
TOWEL OR 17X26 10 PK STRL BLUE (TOWEL DISPOSABLE) ×3 IMPLANT
WATER STERILE IRR 1000ML POUR (IV SOLUTION) ×3 IMPLANT

## 2013-09-11 NOTE — Progress Notes (Signed)
Patient ID: Christy Simmons, female   DOB: February 06, 1935, 79 y.o.   MRN: [id number] Subjective:  The patient is alert and pleasant. She looks well. She is in no apparent distress.  Objective: Vital signs in last 24 hours: Temp:  [97.9 F (36.6 C)-98.1 F (36.7 C)] 97.9 F (36.6 C) (02/12 1412) Pulse Rate:  [61-66] 66 (02/12 1412) Resp:  [14-18] 14 (02/12 1412) BP: (119-145)/(51-69) 119/51 mmHg (02/12 1412) SpO2:  [100 %] 100 % (02/12 0837)  Intake/Output from previous day:   Intake/Output this shift: Total I/O In: 1000 [I.V.:1000] Out: -   Physical exam patient is alert and pleasant. She is moving her lower extremities well.  Lab Results: No results found for this basename: WBC, HGB, HCT, PLT,  in the last 72 hours BMET No results found for this basename: NA, K, CL, CO2, GLUCOSE, BUN, CREATININE, CALCIUM,  in the last 72 hours  Studies/Results: No results found.  Assessment/Plan: The patient is doing well.  LOS: 0 days     Christy Simmons D 09/11/2013, 2:22 PM

## 2013-09-11 NOTE — Op Note (Signed)
Brief history: The patient is a 79 year old white female who has a prior history of the lumbar ruptured disc. She has developed recurrent back, buttock, and leg pain consistent with neurogenic claudication. The patient failed medical management and was worked up with a lumbar MRI. This demonstrated spinal stenosis at L 34 and L4-5 with a recurrent ruptured disc at L5-S1. I discussed the various treatment options including surgery. The patient has weighed the risks, benefits, and alternatives surgery and decided proceed with a decompressive laminectomy and redo discectomy.  Preoperative diagnosis: Lumbar spinal stenosis, recurrent ruptured disc, lumbago, lumbar radiculopathy  Postoperative diagnosis: Same  Procedure: Redo right L5-S1 laminotomy foraminotomy, L4 laminectomy, bilateral L3 laminotomies to decompress the bilateral L4 and L5 nerve roots and the right S1 nerve root using microdissection  Surgeon: Dr. Earle Gell  Asst.: Dr. Erline Levine  Anesthesia: Gen. endotracheal  Estimated blood loss: 100 cc  Drains: None  Complications: None  Description of procedure: The patient was brought to the operating room by the anesthesia team. General endotracheal anesthesia was induced. The patient was turned to the prone position on the Wilson frame. The patient's lumbosacral region was then prepared with Betadine scrub and Betadine solution. Sterile drapes were applied.  I then injected the area to be incised with Marcaine with epinephrine solution. I then used a scalpel to make a linear midline incision over the L3-4, L4-5 and L5-S1 intervertebral disc space, incising through her old surgical scar. I then used electrocautery to perform a bilateral  subperiosteal dissection exposing the spinous process and lamina of L3, L4 and L5. We obtained intraoperative radiograph to confirm our location. I then inserted the [facility] retractor for exposure. I used the scalpel to incise the interspinous  ligament at L3-4 and L4-5. I used Leksell  to remove the spinous process of L4.  We then brought the operative microscope into the field. Under its magnification and illumination we completed the microdissection. I used a high-speed drill to perform bilateral laminotomies at L3, L4 and on the right at L5. I then used a Kerrison punches to complete the L4 laminectomy, widen the L3 laminotomies and widen the right L5 laminotomy. I removed the ligamentum flavum at L3-4 and L4-5 with a Kerrison punches. I removed the epidural scar tissue at L5-S1 with a Kerrison punch. We then used microdissection to free up the thecal sac and the bilateral L4 and L5 nerve root from the epidural tissue. I then used a Kerrison punch to perform a foraminotomy at about the bilateral L4 and L5 nerve root. We then using the nerve root retractor to gently retract the thecal sac and the right S1 nerve root medially. This exposed the intervertebral disc. This was bulging mildly but did not appear to be causing any significant neural compression. We therefore did not perform any and intervertebral discectomy.  I then palpated along the ventral surface of the thecal sac and along exit route of the bilateral L4 and L5 and right S1 nerve root and noted that the neural structures were well decompressed. This completed the decompression.  We then obtained hemostasis using bipolar electrocautery. We irrigated the wound out with bacitracin solution. We then removed the retractor. We then reapproximated the patient's thoracolumbar fascia with interrupted #1 Vicryl suture. We then reapproximated the patient's subcutaneous tissue with interrupted 2-0 Vicryl suture. We then reapproximated patient's skin with Steri-Strips and benzoin. The was then coated with bacitracin ointment. The drapes were removed. The patient was subsequently returned to the supine position  where they were extubated by the anesthesia team. The patient was then transported to  the postanesthesia care unit in stable condition. All sponge instrument and needle counts were reportedly correct at the end of this case.

## 2013-09-11 NOTE — Anesthesia Postprocedure Evaluation (Signed)
Anesthesia Post Note  Patient: Christy Simmons  Procedure(s) Performed: Procedure(s) (LRB): LUMBAR LAMINECTOMY/DECOMPRESSION MICRODISCECTOMY 2 LEVELS (N/A)  Anesthesia type: general  Patient location: PACU  Post pain: Pain level controlled  Post assessment: Patient's Cardiovascular Status Stable  Last Vitals:  Filed Vitals:   09/11/13 1600  BP: 150/61  Pulse: 63  Temp: 35.6 C  Resp: 16    Post vital signs: Reviewed and stable  Level of consciousness: sedated  Complications: No apparent anesthesia complications

## 2013-09-11 NOTE — Anesthesia Procedure Notes (Signed)
Procedure Name: Intubation Date/Time: 09/11/2013 11:38 AM Performed by: Daisy Lazar Pre-anesthesia Checklist: Patient identified, Patient being monitored, Emergency Drugs available and Suction available Patient Re-evaluated:Patient Re-evaluated prior to inductionOxygen Delivery Method: Circle system utilized Preoxygenation: Pre-oxygenation with 100% oxygen Intubation Type: IV induction Ventilation: Mask ventilation without difficulty Laryngoscope Size: Mac and 3 Grade View: Grade II Tube type: Oral Tube size: 7.5 mm Number of attempts: 1 Airway Equipment and Method: Stylet Placement Confirmation: ETT inserted through vocal cords under direct vision,  breath sounds checked- equal and bilateral and positive ETCO2 Secured at: 20 cm Tube secured with: Tape Dental Injury: Teeth and Oropharynx as per pre-operative assessment  Difficulty Due To: Difficulty was anticipated and Difficult Airway- due to limited oral opening

## 2013-09-11 NOTE — H&P (Signed)
Subjective: The patient is a 79 year old white female who's had prior back surgery by another physician many years ago. She has developed back pain with pain into her legs. She has failed medical management. She was worked up with a lumbar MRI which demonstrated spinal stenosis at L4-5 without ruptured disc at L5-S1. I discussed the various treatment options with the patient including surgery. She has weighed the risks, benefits, and alternatives surgery and decided proceed with a L4-5 laminectomy with L5-S1 discectomy.   Past Medical History  Diagnosis Date  . Transient ischemic attack   . HTN (hypertension)   . Hyperlipidemia   . DM (diabetes mellitus)   . GERD (gastroesophageal reflux disease)   . Heart disease     PFO  . Amnesia 01/10/2013  . Patent foramen ovale with atrial septal aneurysm   . Complication of anesthesia   . PONV (postoperative nausea and vomiting)   . Family history of anesthesia complication     "My brother has trouble waking up."  . Glaucoma     Hx: of  . Early cataracts, bilateral     Hx: of  . Arthritis     Past Surgical History  Procedure Laterality Date  . US echocardiography  10-13-11    EF 60-65% normal  . Back surgery      disc   . Tonsillectomy    . Tee without cardioversion  10/30/2011    Procedure: TRANSESOPHAGEAL ECHOCARDIOGRAM (TEE);  Surgeon: Larey Dresser, MD;  Location: [facility];  Service: Cardiovascular;  Laterality: N/A;  . Colonoscopy w/ biopsies and polypectomy      HX;of  . Tonsillectomy    . Tubal ligation      Allergies  Allergen Reactions  . Gabapentin     Caused depression  . Codeine     Headache    History  Substance Use Topics  . Smoking status: Never Smoker   . Smokeless tobacco: Never Used  . Alcohol Use: No    Family History  Problem Relation Age of Onset  . Stroke      No family history of such  . Congenital heart disease      No family history of such  . Anesthesia problems Neg Hx   . Diabetes  Maternal Grandfather    Prior to Admission medications   Medication Sig Start Date End Date Taking? Authorizing Provider  aluminum-magnesium hydroxide-simethicone (MAALOX) 242-353-61 MG/5ML SUSP Take 200 mLs by mouth 2 (two) times daily with a meal.   Yes Historical Provider, MD  aspirin 81 MG tablet Take 81 mg by mouth daily.   Yes Historical Provider, MD  brimonidine (ALPHAGAN) 0.2 % ophthalmic solution Place 1 drop into both eyes 3 (three) times daily.  11/18/12  Yes Historical Provider, MD  Cholecalciferol (VITAMIN D3) 1000 UNITS CAPS Take 1,000 Units by mouth daily.   Yes Historical Provider, MD  HYDROcodone-acetaminophen (NORCO/VICODIN) 5-325 MG per tablet Take 1 tablet by mouth every 6 (six) hours as needed for moderate pain.   Yes Historical Provider, MD  ibuprofen (ADVIL,MOTRIN) 200 MG tablet Take 200 mg by mouth every 6 (six) hours as needed.   Yes Historical Provider, MD  insulin aspart (NOVOLOG) 100 UNIT/ML injection Inject 12-14 Units into the skin 2 (two) times daily. Per sliding scale *takes in am and at noon*   Yes Historical Provider, MD  insulin glargine (LANTUS) 100 UNIT/ML injection Inject 20 Units into the skin every morning.   Yes Historical Provider, MD  lisinopril-hydrochlorothiazide Reita May)  20-25 MG per tablet Take 1 tablet by mouth daily.   Yes Historical Provider, MD  Multiple Vitamin (MULITIVITAMIN WITH MINERALS) TABS Take 1 tablet by mouth daily.   Yes Historical Provider, MD  traMADol (ULTRAM) 50 MG tablet Take 50 mg by mouth every 6 (six) hours as needed for moderate pain.  06/03/13  Yes Historical Provider, MD     Review of Systems  Positive ROS: As above  All other systems have been reviewed and were otherwise negative with the exception of those mentioned in the HPI and as above.  Objective: Vital signs in last 24 hours: Temp:  [98.1 F (36.7 C)] 98.1 F (36.7 C) (02/12 0837) Pulse Rate:  [61] 61 (02/12 0837) Resp:  [18] 18 (02/12 0837) BP:  (145)/(69) 145/69 mmHg (02/12 0837) SpO2:  [100 %] 100 % (02/12 0837)  General Appearance: Alert, cooperative, no distress, Head: Normocephalic, without obvious abnormality, atraumatic Eyes: PERRL, conjunctiva/corneas clear, EOM's intact,    Ears: Normal  Throat: Normal  Neck: Supple, symmetrical, trachea midline, no adenopathy; thyroid: No enlargement/tenderness/nodules; no carotid bruit or JVD Back: Symmetric, no curvature, ROM normal, no CVA tenderness. The patient's incision is well-healed. Lungs: Clear to auscultation bilaterally, respirations unlabored Heart: Regular rate and rhythm, no murmur, rub or gallop Abdomen: Soft, non-tender,, no masses, no organomegaly Extremities: Extremities normal, atraumatic, no cyanosis or edema Pulses: 2+ and symmetric all extremities Skin: Skin color, texture, turgor normal, no rashes or lesions  NEUROLOGIC:   Mental status: alert and oriented, no aphasia, good attention span, Fund of knowledge/ memory ok Motor Exam - grossly normal Sensory Exam - grossly normal Reflexes:  Coordination - grossly normal Gait - grossly normal Balance - grossly normal Cranial Nerves: I: smell Not tested  II: visual acuity  OS: Normal  OD: Normal   II: visual fields Full to confrontation  II: pupils Equal, round, reactive to light  III,VII: ptosis None  III,IV,VI: extraocular muscles  Full ROM  V: mastication Normal  V: facial light touch sensation  Normal  V,VII: corneal reflex  Present  VII: facial muscle function - upper  Normal  VII: facial muscle function - lower Normal  VIII: hearing Not tested  IX: soft palate elevation  Normal  IX,X: gag reflex Present  XI: trapezius strength  5/5  XI: sternocleidomastoid strength 5/5  XI: neck flexion strength  5/5  XII: tongue strength  Normal    Data Review Lab Results  Component Value Date   WBC 12.9* 09/08/2013   HGB 11.9* 09/08/2013   HCT 36.3 09/08/2013   MCV 89.4 09/08/2013   PLT 229 09/08/2013   Lab  Results  Component Value Date   NA 139 09/08/2013   K 4.2 09/08/2013   CL 100 09/08/2013   CO2 29 09/08/2013   BUN 19 09/08/2013   CREATININE 0.98 09/08/2013   GLUCOSE 222* 09/08/2013   No results found for this basename: INR, PROTIME    Assessment/Plan: L4-5 spinal stenosis L5-S1 herniated disc, lumbago, lumbar radiculopathy, neurogenic claudication : I discussed the situation with the patient. I reviewed her MR scan with her and pointed out the abnormalities. We have discussed the various treatment options including surgery. I described the surgical treatment option of an L4 decompressive laminectomy with a redo L5-S1 right discectomy. I described the surgery to her. I've shown her surgical models. We have discussed the risks, benefits, alternatives, and likelihood of achieving our goals with surgery. I've answered all the patient's questions. She has decided proceed with  surgery.   Zala Degrasse D 09/11/2013 10:53 AM

## 2013-09-11 NOTE — Preoperative (Signed)
Beta Blockers   Reason not to administer Beta Blockers:Not Applicable 

## 2013-09-11 NOTE — Transfer of Care (Signed)
Immediate Anesthesia Transfer of Care Note  Patient: Christy Simmons  Procedure(s) Performed: Procedure(s) with comments: LUMBAR LAMINECTOMY/DECOMPRESSION MICRODISCECTOMY 2 LEVELS (N/A) - Lumbar Four-Five Laminectomy and Foraminotomy with redo Lumbar Five-Sacral One diskectomy  Patient Location: PACU  Anesthesia Type:General  Level of Consciousness: awake, alert  and oriented  Airway & Oxygen Therapy: Patient Spontanous Breathing and Patient connected to nasal cannula oxygen  Post-op Assessment: Report given to PACU RN and Post -op Vital signs reviewed and stable  Post vital signs: Reviewed and stable  Complications: No apparent anesthesia complications

## 2013-09-11 NOTE — Anesthesia Preprocedure Evaluation (Signed)
Anesthesia Evaluation  Patient identified by MRN, date of birth, ID band Patient awake    Reviewed: Allergy & Precautions, H&P , NPO status , Patient's Chart, lab work & pertinent test results  History of Anesthesia Complications (+) PONV  Airway Mallampati: I TM Distance: >3 FB Neck ROM: Full    Dental   Pulmonary          Cardiovascular hypertension, Pt. on medications + Valvular Problems/Murmurs  Pt has ASD. Has been recommended to having it closed, but Pt does not want to.   Neuro/Psych    GI/Hepatic GERD-  Medicated and Controlled,  Endo/Other  diabetes, Type 2, Oral Hypoglycemic Agents  Renal/GU      Musculoskeletal   Abdominal   Peds  Hematology   Anesthesia Other Findings   Reproductive/Obstetrics                           Anesthesia Physical Anesthesia Plan  ASA: II  Anesthesia Plan: General   Post-op Pain Management:    Induction: Intravenous  Airway Management Planned: Oral ETT  Additional Equipment:   Intra-op Plan:   Post-operative Plan: Extubation in OR  Informed Consent: I have reviewed the patients History and Physical, chart, labs and discussed the procedure including the risks, benefits and alternatives for the proposed anesthesia with the patient or authorized representative who has indicated his/her understanding and acceptance.     Plan Discussed with: CRNA and Surgeon  Anesthesia Plan Comments:         Anesthesia Quick Evaluation

## 2013-09-12 ENCOUNTER — Encounter (HOSPITAL_COMMUNITY): Payer: Self-pay | Admitting: Neurosurgery

## 2013-09-12 LAB — GLUCOSE, CAPILLARY
GLUCOSE-CAPILLARY: 286 mg/dL — AB (ref 70–99)
GLUCOSE-CAPILLARY: 107 mg/dL — AB (ref 70–99)
Glucose-Capillary: 169 mg/dL — ABNORMAL HIGH (ref 70–99)
Glucose-Capillary: 173 mg/dL — ABNORMAL HIGH (ref 70–99)
Glucose-Capillary: 310 mg/dL — ABNORMAL HIGH (ref 70–99)
Glucose-Capillary: 310 mg/dL — ABNORMAL HIGH (ref 70–99)
Glucose-Capillary: 312 mg/dL — ABNORMAL HIGH (ref 70–99)

## 2013-09-12 MED ORDER — INSULIN GLARGINE 100 UNIT/ML ~~LOC~~ SOLN
20.0000 [IU] | Freq: Every morning | SUBCUTANEOUS | Status: DC
  Administered 2013-09-12 – 2013-09-13 (×2): 20 [IU] via SUBCUTANEOUS
  Filled 2013-09-12 (×3): qty 0.2

## 2013-09-12 NOTE — Progress Notes (Signed)
   CARE MANAGEMENT NOTE 09/12/2013  Patient:  Christy Simmons, Christy Simmons   Account Number:  [id number]  Date Initiated:  09/12/2013  Documentation initiated by:  Olga Coaster  Subjective/Objective Assessment:   ADMITTED FOR BACK SURGERY     Action/Plan:   CM FOLLOWING FOR DCP   Anticipated DC Date:  09/13/2013   Anticipated DC Plan:  [place] [facility]/ AWAITING PT/OT EVALS FOR DISPOSITION NEEDS WITH MD APPROVAL    DC Planning Services  CM consult              Status of service:  In process, will continue to follow Medicare Important Message given?  NA - LOS <3 / Initial given by admissions (If response is "NO", the following Medicare IM given date fields will be blank)  Per UR Regulation:  Reviewed for med. necessity/level of care/duration of stay  Comments:  2/13/2015Mindi Slicker RN,BSN,MHA [phone]

## 2013-09-12 NOTE — Progress Notes (Signed)
Patient ID: Christy Simmons, female   DOB: January 22, 1935, 79 y.o.   MRN: [id number] Subjective:  The patient is alert and pleasant. She looks well. She has some urinary retention last night.  Objective: Vital signs in last 24 hours: Temp:  [96 F (35.6 C)-98.9 F (37.2 C)] 98.9 F (37.2 C) (02/13 0546) Pulse Rate:  [61-75] 75 (02/13 0546) Resp:  [14-20] 18 (02/13 0546) BP: (106-150)/(49-90) 108/49 mmHg (02/13 0546) SpO2:  [99 %-100 %] 100 % (02/13 0546) Weight:  [64.4 kg (141 lb 15.6 oz)] 64.4 kg (141 lb 15.6 oz) (02/12 1600)  Intake/Output from previous day: 02/12 0701 - 02/13 0700 In: 1500 [I.V.:1500] Out: 905 [Urine:905] Intake/Output this shift:    Physical exam the patient is alert and pleasant. Her strength is grossly normal in her lower extremities. Her dressing is clean and dry.  Lab Results: No results found for this basename: WBC, HGB, HCT, PLT,  in the last 72 hours BMET No results found for this basename: NA, K, CL, CO2, GLUCOSE, BUN, CREATININE, CALCIUM,  in the last 72 hours  Studies/Results: Dg Lumbar Spine 1 View  09/11/2013   CLINICAL DATA:  79 year old female undergoing lumbar spine surgery. Initial encounter.  EXAM: LUMBAR SPINE - 1 VIEW  COMPARISON:  [facility] lumbar MRI 04/15/2013.  FINDINGS: Lumbar segmentation appears to be normal on the comparison and will be designated as such for this report.  Intraoperative portable cross-table lateral view of the lumbar spine labeled #1 at 1235 hrs. Surgical probe directed at the L5-S1 disc space.  IMPRESSION: Intraoperative localization at L5-S1.   Electronically Signed   By: Lars Pinks M.D.   On: 09/11/2013 14:57    Assessment/Plan: Postop day 1: The patient is doing well. We will mobilize her with PT and OT. She will likely go home tomorrow. I gave her her discharge instructions and answered all her questions.  Urinary retention: This seems to be resolving.  LOS: 1 day     Tyaire Odem  D 09/12/2013, 8:02 AM

## 2013-09-12 NOTE — Evaluation (Signed)
Occupational Therapy Evaluation Patient Details Name: Christy Simmons MRN: [id number] DOB: 05-24-1935 Today's Date: 09/12/2013 Time: 5366-4403 OT Time Calculation (min): 32 min  OT Assessment / Plan / Recommendation History of present illness Patient is 79 yo female s/p LUMBAR LAMINECTOMY/DECOMPRESSION MICRODISCECTOMY 2 LEVELS.   Clinical Impression   Pt admitted with above. She demonstrates the below listed deficits and will benefit from continued OT to maximize safety and independence with BADLs.  Currently, she requires mod - total A with BADLs as pain limited her ability to participate this pm.  She requires max cues to recall and maintain back precautions      OT Assessment  Patient needs continued OT Services    Follow Up Recommendations  Home health OT;Supervision/Assistance - 24 hour    Barriers to Discharge      Equipment Recommendations  Tub/shower seat    Recommendations for Other Services    Frequency  Min 2X/week    Precautions / Restrictions Precautions Precautions: Back Precaution Comments: Pt unable to state any back precautions.  Pt re-instructed in back precautions and needs mod A to adhere to them.  Restrictions Weight Bearing Restrictions: No   Pertinent Vitals/Pain     ADL  Eating/Feeding: Set up Where Assessed - Eating/Feeding: Chair Grooming: Wash/dry hands;Wash/dry face;Brushing hair;Teeth care;Minimal assistance Where Assessed - Grooming: Supported sitting Upper Body Bathing: Moderate assistance Where Assessed - Upper Body Bathing: Supported sitting Lower Body Bathing: Maximal assistance Where Assessed - Lower Body Bathing: Supported sit to stand Upper Body Dressing: Moderate assistance Where Assessed - Upper Body Dressing: Unsupported sitting;Supported sitting Lower Body Dressing: +1 Total assistance Where Assessed - Lower Body Dressing: Supported sit to Lobbyist: Moderate assistance Toilet Transfer Method: Sit to stand;Stand  pivot Toilet Transfer Equipment: Comfort height toilet Toileting - Clothing Manipulation and Hygiene: Maximal assistance Where Assessed - Toileting Clothing Manipulation and Hygiene: Standing Equipment Used: Rolling walker Transfers/Ambulation Related to ADLs: mod A for sit to stand; min a for ambulation with RW ADL Comments: Pt moving very slowly.  Complaint of back pain and something in throat.  Requires cues and encouragement to fully participate    OT Diagnosis: Generalized weakness;Acute pain  OT Problem List: Decreased strength;Decreased activity tolerance;Impaired balance (sitting and/or standing);Decreased safety awareness;Decreased knowledge of use of DME or AE;Decreased knowledge of precautions;Pain OT Treatment Interventions: Self-care/ADL training;DME and/or AE instruction;Patient/family education;Therapeutic activities   OT Goals(Current goals can be found in the care plan section) Acute Rehab OT Goals Patient Stated Goal: to go home with family OT Goal Formulation: With patient Time For Goal Achievement: 09/19/13 Potential to Achieve Goals: Good ADL Goals Pt Will Perform Grooming: with supervision;standing Pt Will Perform Upper Body Bathing: with supervision;sitting Pt Will Perform Lower Body Bathing: with supervision;sit to/from stand Pt Will Perform Upper Body Dressing: with supervision;sitting Pt Will Perform Lower Body Dressing: with supervision;sit to/from stand Pt Will Transfer to Toilet: with supervision;ambulating;regular height toilet;bedside commode;grab bars Pt Will Perform Toileting - Clothing Manipulation and hygiene: with supervision;sit to/from stand Additional ADL Goal #1: Pt will be independent with back precautions during BADLs and functional mobility   Visit Information  Last OT Received On: 09/12/13 Assistance Needed: +1 History of Present Illness: Patient is 79 yo female s/p LUMBAR LAMINECTOMY/DECOMPRESSION MICRODISCECTOMY 2 LEVELS.       Prior  [place] expects to be discharged to:: Private residence Living Arrangements: Spouse/significant other Type of Home: House Home Access: Stairs to enter [employer] of Steps:  2 Entrance Stairs-Rails: None Home Layout: One level Home Equipment: Cane - single point;Bedside commode Additional Comments: walk in and tub shower, standard Prior Function Level of Independence: Independent;Needs assistance ADL's / Homemaking Assistance Needed: She reports family has been assisting her with donning socks x 9 mos Communication Communication:  (reading glassed) Dominant Hand: Right         Vision/Perception     Cognition  Cognition Arousal/Alertness: Awake/alert Behavior During Therapy: Flat affect Overall Cognitive Status: Impaired/Different from baseline Area of Impairment: Memory Memory: Decreased recall of precautions General Comments: deficits may be due to medications    Extremity/Trunk Assessment Upper Extremity Assessment Upper Extremity Assessment: RUE deficits/detail RUE Deficits / Details: muscular pain bicep and deltoid region.  Pt instructed to perform AROM rt. UE and neck  Lower Extremity Assessment Lower Extremity Assessment: Defer to PT evaluation Cervical / Trunk Assessment Cervical / Trunk Assessment: Kyphotic     Mobility Bed Mobility Overal bed mobility: Needs Assistance Bed Mobility: Sidelying to Sit Sidelying to sit: Mod assist General bed mobility comments: Pt unable to recall technique from PT session.  Pt requires max verbal cues and assist for safe techniqu.  Pt lying in bed with torso twisted and in flexed position.  Pt and dtr instructed in precautions and positioning Transfers Overall transfer level: Needs assistance Equipment used: Rolling walker (2 wheeled) Transfers: Sit to/from Stand Sit to Stand: Mod assist General transfer comment: Verbal cues for hand placement.   Assist to move into standing  and assist to keep pt from bending as she attempts to maintain a very flexed posture     Exercise Other Exercises Other Exercises: Pt instructed to perform neck rotation and lateral flexion x 5;  Other Exercises: Shoulder flexion AAROM x 5   Balance Balance Overall balance assessment: Needs assistance Sitting-balance support: Bilateral upper extremity supported;Feet supported Sitting balance-Leahy Scale: Fair Standing balance support: Bilateral upper extremity supported Standing balance-Leahy Scale: Poor   End of Session OT - End of Session Equipment Utilized During Treatment: Rolling walker Activity Tolerance: Patient limited by pain Patient left: in chair;with call bell/phone within reach;with family/visitor present Nurse Communication: Mobility status;Patient requests pain meds  GO     Marqueze Ramcharan, Ellard Artis M 09/12/2013, 4:20 PM

## 2013-09-12 NOTE — Progress Notes (Signed)
Pt. Has been complaining of urinary urgency and inability to control a constant slow urination. Bladder scanner showed >400 ml. Pt was in and out catheterized and voided 468ml. She is resting comfortably now. Will continue to monitor. Antron Seth, Rande Brunt, RN

## 2013-09-12 NOTE — Evaluation (Signed)
Physical Therapy Evaluation Patient Details Name: Christy Simmons MRN: [id number] DOB: [birth year] Today's Date: 09/12/2013 Time: 4081-4481 PT Time Calculation (min): 25 min  PT Assessment / Plan / Recommendation History of Present Illness  Patient is 79 yo female s/p LUMBAR LAMINECTOMY/DECOMPRESSION MICRODISCECTOMY 2 LEVELS.  Clinical Impression  Patient demonstrates deficits in functional mobility as indicated below. Will benefit from continued skilled PT to address deficits and maximize function. Will see as indicated and progress as tolerated. Recommend supervision and assist from family and HHPT upon discharge.    PT Assessment  Patient needs continued PT services    Follow Up Recommendations  Home health PT;Supervision/Assistance - 24 hour    Does the patient have the potential to tolerate intense rehabilitation      Barriers to Discharge        Equipment Recommendations  Rolling walker with 5" wheels;3in1 (PT)    Recommendations for Other Services     Frequency Min 5X/week    Precautions / Restrictions Precautions Precautions: Back Precaution Comments: educated on movements for comfort Restrictions Weight Bearing Restrictions: No   Pertinent Vitals/Pain 7/10      Mobility  Bed Mobility Overal bed mobility: Needs Assistance Bed Mobility: Rolling;Sidelying to Sit Rolling: Min guard Sidelying to sit: Mod assist General bed mobility comments: VCs for positioning, assist to elevate to sitting and rotate hips to EOB Transfers Overall transfer level: Needs assistance Equipment used: Rolling walker (2 wheeled) Transfers: Sit to/from Stand Sit to Stand: Mod assist General transfer comment: VCs for hand placement assist for standing and stability Ambulation/Gait Ambulation/Gait assistance: Min guard Ambulation Distance (Feet): 12 Feet Assistive device: Rolling walker (2 wheeled) Gait Pattern/deviations: Step-to pattern;Decreased stride length Gait velocity: decreased     Exercises     PT Diagnosis: Difficulty walking;Abnormality of gait;Generalized weakness;Acute pain  PT Problem List: Decreased strength;Decreased range of motion;Decreased activity tolerance;Decreased balance;Decreased mobility;Pain PT Treatment Interventions: DME instruction;Gait training;Stair training;Functional mobility training;Therapeutic activities;Therapeutic exercise;Balance training;Patient/family education     PT Goals(Current goals can be found in the care plan section) Acute Rehab PT Goals Patient Stated Goal: to go home with family PT Goal Formulation: With patient/family Time For Goal Achievement: 09/26/13 Potential to Achieve Goals: Good  Visit Information  Last PT Received On: 09/12/13 Assistance Needed: +1 History of Present Illness: Patient is 79 yo female s/p LUMBAR LAMINECTOMY/DECOMPRESSION MICRODISCECTOMY 2 LEVELS.       Prior [place] expects to be discharged to:: Private residence Living Arrangements: Spouse/significant other Type of Home: House Home Access: Stairs to enter [employer] of Steps: 2 Entrance Stairs-Rails: None Home Layout: One level [place]: [place] - single point Additional Comments: walk in and tub shower, standard Prior Function Level of Independence: Independent Communication Communication:  (reading glassed) Dominant Hand: Right    Cognition  Cognition Arousal/Alertness: Awake/alert Behavior During Therapy: Flat affect Overall Cognitive Status: Within Functional Limits for tasks assessed    Extremity/Trunk Assessment Upper Extremity Assessment Upper Extremity Assessment: Defer to OT evaluation Lower Extremity Assessment Lower Extremity Assessment: Generalized weakness   Balance  Guarded  End of Session PT - End of Session Equipment Utilized During Treatment: Gait belt Activity Tolerance: Patient limited by fatigue;Patient limited by pain Patient left: in chair;with call  bell/phone within reach;with family/visitor present Nurse Communication: Mobility status  GP     [name] 09/12/2013, 12:03 PM [name], [place] DPT  [phone]

## 2013-09-13 LAB — GLUCOSE, CAPILLARY
GLUCOSE-CAPILLARY: 140 mg/dL — AB (ref 70–99)
GLUCOSE-CAPILLARY: 180 mg/dL — AB (ref 70–99)
Glucose-Capillary: 180 mg/dL — ABNORMAL HIGH (ref 70–99)
Glucose-Capillary: 202 mg/dL — ABNORMAL HIGH (ref 70–99)

## 2013-09-13 MED ORDER — OXYCODONE-ACETAMINOPHEN 5-325 MG PO TABS: 1.0000 | ORAL_TABLET | ORAL | Status: DC | PRN

## 2013-09-13 MED ORDER — DIAZEPAM 5 MG PO TABS: 5.0000 mg | ORAL_TABLET | Freq: Four times a day (QID) | ORAL | Status: DC | PRN

## 2013-09-13 NOTE — Progress Notes (Signed)
I have reviewed and agree with this note. [place], [place] [phone] 09/13/2013

## 2013-09-13 NOTE — Progress Notes (Signed)
Physical Therapy Treatment Patient Details Name: LYNNETTE POTE MRN: [id number] DOB: [birth year] Today's Date: 09/13/2013 Time: 9147-8295 PT Time Calculation (min): 19 min  PT Assessment / Plan / Recommendation  History of Present Illness Patient is 79 yo female s/p LUMBAR LAMINECTOMY/DECOMPRESSION MICRODISCECTOMY 2 LEVELS.   PT Comments   Pt with improved ambulation tolerance despite increased back pain and fatigue. Pt's daughter assisted and present to assist with stair negotiation. Pt safe to d/c home with 24/7 assist of family and use of RW once medically stable.   Follow Up Recommendations  Home health PT;Supervision/Assistance - 24 hour     Does the patient have the potential to tolerate intense rehabilitation     Barriers to Discharge        Equipment Recommendations  Rolling walker with 5" wheels;3in1 (PT)    Recommendations for Other Services    Frequency Min 5X/week   Progress towards PT Goals Progress towards PT goals: Progressing toward goals  Plan Current plan remains appropriate    Precautions / Restrictions Precautions Precautions: Back Precaution Comments: unable to recall precautions, reviewed 3/3 and had pt. and dtr. repeat Restrictions Weight Bearing Restrictions: No   Pertinent Vitals/Pain C/o surgical back pain but did not rate    Mobility  Bed Mobility General bed mobility comments: pt up in chair upon PT arrival Transfers Overall transfer level: Needs assistance Equipment used: Rolling walker (2 wheeled) Transfers: Sit to/from Stand Sit to Stand: Min guard;Min assist General transfer comment: v/c's for safe hand placement Ambulation/Gait Ambulation/Gait assistance: Min guard Ambulation Distance (Feet): 100 Feet Assistive device: Rolling walker (2 wheeled) Gait Pattern/deviations: Step-through pattern General Gait Details: v/c's to stand up straight Stairs: Yes Stairs assistance: +2 physical assistance Number of Stairs: 2 General stair comments:  2 to mimic home set up, bilat HHA due to no handrails at home    Exercises     PT Diagnosis:    PT Problem List:   PT Treatment Interventions:     PT Goals (current goals can now be found in the care plan section) Acute Rehab PT Goals Patient Stated Goal: to go home with family  Visit Information  Last PT Received On: 09/13/13 Assistance Needed: +1 History of Present Illness: Patient is 79 yo female s/p LUMBAR LAMINECTOMY/DECOMPRESSION MICRODISCECTOMY 2 LEVELS.    Subjective Data  Patient Stated Goal: to go home with family   Cognition  Cognition Arousal/Alertness: Awake/alert Behavior During Therapy: WFL for tasks assessed/performed Overall Cognitive Status: Within Functional Limits for tasks assessed    Balance  Balance Overall balance assessment: Needs assistance Standing balance support: Bilateral upper extremity supported Standing balance-Leahy Scale: Poor Standing balance comment: pt requires use of RW for safe standing/amb  End of Session PT - End of Session Equipment Utilized During Treatment: Gait belt Activity Tolerance: Patient limited by fatigue;Patient limited by pain Patient left: in chair;with call bell/phone within reach;with family/visitor present Nurse Communication: Mobility status   GP     Kingsley Callander 09/13/2013, 11:36 AM  Kittie Plater, PT, DPT Pager #: [phone] Office #: [phone]

## 2013-09-13 NOTE — Discharge Summary (Addendum)
  Physician Discharge Summary  Patient ID: Christy Simmons MRN: [id number] DOB/AGE: 77/20/1936 77 y.o.  Admit date: 09/11/2013 Discharge date: 09/13/2013  Admission Diagnoses: Lumbar stenosis with claudication  Discharge Diagnoses: Same Active Problems:   Lumbar stenosis with neurogenic claudication   Discharged Condition: Stable  Hospital Course:  Mrs. Christy Simmons is a 79 y.o. female admitted after elective multi-level decompression for claudication. Initially postoperatively she had significant back pain which improved on POD#2. She reports resolution of her preoperative leg pain. She was ambulating with PT/OT, voiding normally, with pain under control with oral meds. She was tolerating regular diet and therefore deemed stable for d/c on POD#2.  Treatments: Surgery - R L5-S1 laminotomy/foraminotomy, L4 laminectomy, bilateral L3 laminotomy  Discharge Exam: Blood pressure 125/65, pulse 66, temperature 98.1 F (36.7 C), temperature source Oral, resp. rate 20, height 5\' 5"  (1.651 m), weight 64.4 kg (141 lb 15.6 oz), SpO2 99.00%. Awake, alert, oriented Speech fluent, appropriate CN grossly intact 5/5 BUE/BLE slight proximal give away weakness BLE Wound c/d/i  Follow-up: Follow-up in Dr. Arnoldo Morale' office [facility] [phone]) in 2-3 weeks  Disposition: 01-Home or Self Care   Future Appointments Provider Department Dept Phone   12/17/2013 10:00 AM Kathrynn Ducking, MD [facility] [phone]       Medication List    STOP taking these medications       aspirin 81 MG tablet     HYDROcodone-acetaminophen 5-325 MG per tablet  Commonly known as:  NORCO/VICODIN     traMADol 50 MG tablet  Commonly known as:  ULTRAM      TAKE these medications       aluminum-magnesium hydroxide-simethicone 030-092-33 MG/5ML Susp  Commonly known as:  MAALOX  Take 200 mLs by mouth 2 (two) times daily with a meal.     brimonidine 0.2 % ophthalmic  solution  Commonly known as:  ALPHAGAN  Place 1 drop into both eyes 3 (three) times daily.     diazepam 5 MG tablet  Commonly known as:  VALIUM  Take 1 tablet (5 mg total) by mouth every 6 (six) hours as needed for muscle spasms.     ibuprofen 200 MG tablet  Commonly known as:  ADVIL,MOTRIN  Take 200 mg by mouth every 6 (six) hours as needed.     insulin aspart 100 UNIT/ML injection  Commonly known as:  novoLOG  Inject 12-14 Units into the skin 2 (two) times daily. Per sliding scale *takes in am and at noon*     insulin glargine 100 UNIT/ML injection  Commonly known as:  LANTUS  Inject 20 Units into the skin every morning.     lisinopril-hydrochlorothiazide 20-25 MG per tablet  Commonly known as:  PRINZIDE,ZESTORETIC  Take 1 tablet by mouth daily.     multivitamin with minerals Tabs tablet  Take 1 tablet by mouth daily.     oxyCODONE-acetaminophen 5-325 MG per tablet  Commonly known as:  PERCOCET/ROXICET  Take 1-2 tablets by mouth every 4 (four) hours as needed for moderate pain.     Vitamin D3 1000 UNITS Caps  Take 1,000 Units by mouth daily.         SignedConsuella Lose, C 09/13/2013, 9:32 AM

## 2013-09-13 NOTE — Plan of Care (Signed)
Problem: Acute Rehab OT Goals (only OT should resolve) Goal: Pt. Will Perform Lower Body Bathing Outcome: Not Met (add Reason) Family to assist

## 2013-09-13 NOTE — Progress Notes (Signed)
Occupational Therapy Treatment Patient Details Name: Christy Simmons MRN: [id number] DOB: [birth year] Today's Date: 09/13/2013 Time: 0211-1735 OT Time Calculation (min): 25 min  OT Assessment / Plan / Recommendation  History of present illness Patient is 79 yo female s/p LUMBAR LAMINECTOMY/DECOMPRESSION MICRODISCECTOMY 2 LEVELS.   OT comments  Pt. Eager for participation in skilled OT today.  dtr present and assisted with family education.  Dtr. Confirms pt. Will have assistance with LB adls and states a/e not needed.  Pt. Able to complete all toileting and sim. Shower stall transfer. MD present during session and agrees with d/c home this p.m. After PT. OT to sign off, goals met.  Follow Up Recommendations  Home health OT;Supervision/Assistance - 24 hour           Equipment Recommendations  Tub/shower seat        Frequency Min 2X/week   Progress towards OT Goals Progress towards OT goals: Goals met/education completed, patient discharged from [facility] Discharge plan remains appropriate    Precautions / Restrictions Precautions Precautions: Back Precaution Comments: unable to recall precautions, reviewed 3/3 and had pt. and dtr. repeat   Pertinent Vitals/Pain 5/10, repositioned rn present at end of session to provide meds    ADL  Grooming: Simulated;Supervision/safety Where Assessed - Grooming: Unsupported standing Toilet Transfer: Performed;Supervision/safety Toilet Transfer Method: Sit to stand;Stand pivot Science writer: Comfort height toilet;Grab bars Toileting - Clothing Manipulation and Hygiene: Performed;Supervision/safety Where Assessed - Best boy and Hygiene: Standing Tub/Shower Transfer: Simulated;Min guard Tub/Shower Transfer Method: Anterior-posterior;Ambulating Tub/Shower Transfer Equipment: Walk in shower Equipment Used: Rolling walker Transfers/Ambulation Related to ADLs: min guard a with cues for hand placement on arm rests ADL  Comments: moves slow but safely.  dtr. presents and confirms that pt. will have assistance with LB adls, no a/e needs.  completed sim. shower stall transfer min guard a.  dtr. present for entire session and feels good about pt. progess both agreeable to d/c home    OT Goals(current goals can now be found in the care plan section)    Visit Information  Last OT Received On: 09/13/13 History of Present Illness: Patient is 79 yo female s/p LUMBAR LAMINECTOMY/DECOMPRESSION MICRODISCECTOMY 2 LEVELS.          Prior Functioning       Cognition  Cognition Arousal/Alertness: Awake/alert Behavior During Therapy: WFL for tasks assessed/performed Overall Cognitive Status: Within Functional Limits for tasks assessed    Mobility  Transfers Overall transfer level: Needs assistance Equipment used: Rolling walker (2 wheeled) Transfers: Sit to/from Stand Sit to Stand: Min guard;Min assist General transfer comment: cues for hand placement on arm rests and cues for maintaining upright posture during ambulation, corrects with cues              End of Session OT - End of Session Equipment Utilized During Treatment: Rolling walker Activity Tolerance: Patient tolerated treatment well Patient left: in chair;with family/visitor present;with nursing/sitter in room       Janice Coffin, COTA/L 09/13/2013, 9:45 AM

## 2013-10-02 ENCOUNTER — Ambulatory Visit: Payer: Medicare Other | Admitting: Cardiovascular Disease

## 2013-10-29 ENCOUNTER — Other Ambulatory Visit (HOSPITAL_COMMUNITY): Payer: Self-pay | Admitting: Family Medicine

## 2013-10-29 DIAGNOSIS — Z780 Asymptomatic menopausal state: Secondary | ICD-10-CM

## 2013-11-11 ENCOUNTER — Ambulatory Visit (HOSPITAL_COMMUNITY)
Admission: RE | Admit: 2013-11-11 | Discharge: 2013-11-11 | Disposition: A | Discharge status: Home / Self Care | Payer: Medicare Other | Source: Ambulatory Visit | Attending: Family Medicine | Admitting: Family Medicine

## 2013-11-11 DIAGNOSIS — Z1231 Encounter for screening mammogram for malignant neoplasm of breast: Secondary | ICD-10-CM

## 2013-11-19 ENCOUNTER — Ambulatory Visit (HOSPITAL_COMMUNITY)
Admission: RE | Admit: 2013-11-19 | Discharge: 2013-11-19 | Disposition: A | Discharge status: Home / Self Care | Payer: Medicare Other | Source: Ambulatory Visit | Attending: Family Medicine | Admitting: Family Medicine

## 2013-11-19 DIAGNOSIS — Z780 Asymptomatic menopausal state: Secondary | ICD-10-CM | POA: Insufficient documentation

## 2013-11-19 DIAGNOSIS — Z13820 Encounter for screening for osteoporosis: Secondary | ICD-10-CM | POA: Insufficient documentation

## 2013-12-17 ENCOUNTER — Ambulatory Visit: Payer: Medicare Other | Admitting: Neurology

## 2014-01-13 ENCOUNTER — Ambulatory Visit: Payer: Self-pay | Admitting: Gastroenterology

## 2014-01-16 ENCOUNTER — Ambulatory Visit: Payer: Self-pay | Admitting: Gastroenterology

## 2014-01-16 LAB — PATHOLOGY REPORT

## 2014-01-19 ENCOUNTER — Ambulatory Visit: Payer: Self-pay | Admitting: Gastroenterology

## 2014-06-17 ENCOUNTER — Encounter: Payer: Self-pay | Admitting: Neurology

## 2014-06-23 ENCOUNTER — Encounter: Payer: Self-pay | Admitting: Neurology

## 2014-08-10 DIAGNOSIS — H4010X0 Unspecified open-angle glaucoma, stage unspecified: Secondary | ICD-10-CM | POA: Diagnosis not present

## 2014-08-27 DIAGNOSIS — M7551 Bursitis of right shoulder: Secondary | ICD-10-CM | POA: Diagnosis not present

## 2014-08-27 DIAGNOSIS — Z9181 History of falling: Secondary | ICD-10-CM | POA: Diagnosis not present

## 2014-09-02 DIAGNOSIS — Z0000 Encounter for general adult medical examination without abnormal findings: Secondary | ICD-10-CM | POA: Diagnosis not present

## 2014-09-02 DIAGNOSIS — E1139 Type 2 diabetes mellitus with other diabetic ophthalmic complication: Secondary | ICD-10-CM | POA: Diagnosis not present

## 2014-09-02 DIAGNOSIS — E039 Hypothyroidism, unspecified: Secondary | ICD-10-CM | POA: Diagnosis not present

## 2014-09-02 DIAGNOSIS — R0789 Other chest pain: Secondary | ICD-10-CM | POA: Diagnosis not present

## 2014-09-02 DIAGNOSIS — I10 Essential (primary) hypertension: Secondary | ICD-10-CM | POA: Diagnosis not present

## 2014-09-02 DIAGNOSIS — E780 Pure hypercholesterolemia: Secondary | ICD-10-CM | POA: Diagnosis not present

## 2014-09-04 DIAGNOSIS — I639 Cerebral infarction, unspecified: Secondary | ICD-10-CM

## 2014-09-04 HISTORY — DX: Cerebral infarction, unspecified: I63.9

## 2014-09-05 ENCOUNTER — Emergency Department (HOSPITAL_COMMUNITY): Payer: Medicare Other

## 2014-09-05 ENCOUNTER — Inpatient Hospital Stay (HOSPITAL_COMMUNITY)
Admission: EM | Admit: 2014-09-05 | Discharge: 2014-09-10 | DRG: 023 | Disposition: A | Discharge status: Rehabilitation Facility | Payer: Medicare Other | Attending: Neurology | Admitting: Neurology

## 2014-09-05 ENCOUNTER — Inpatient Hospital Stay (HOSPITAL_COMMUNITY): Payer: Medicare Other

## 2014-09-05 ENCOUNTER — Emergency Department (HOSPITAL_COMMUNITY): Payer: Medicare Other | Admitting: Certified Registered Nurse Anesthetist

## 2014-09-05 ENCOUNTER — Encounter (HOSPITAL_COMMUNITY)
Admission: EM | Disposition: A | Discharge status: Rehabilitation Facility | Payer: Medicare Other | Source: Home / Self Care | Attending: Neurology

## 2014-09-05 ENCOUNTER — Encounter (HOSPITAL_COMMUNITY): Payer: Self-pay | Admitting: Radiology

## 2014-09-05 DIAGNOSIS — R402362 Coma scale, best motor response, obeys commands, at arrival to emergency department: Secondary | ICD-10-CM | POA: Diagnosis not present

## 2014-09-05 DIAGNOSIS — R1313 Dysphagia, pharyngeal phase: Secondary | ICD-10-CM | POA: Diagnosis not present

## 2014-09-05 DIAGNOSIS — G8194 Hemiplegia, unspecified affecting left nondominant side: Secondary | ICD-10-CM | POA: Diagnosis present

## 2014-09-05 DIAGNOSIS — Z794 Long term (current) use of insulin: Secondary | ICD-10-CM

## 2014-09-05 DIAGNOSIS — G936 Cerebral edema: Secondary | ICD-10-CM | POA: Diagnosis not present

## 2014-09-05 DIAGNOSIS — S0990XA Unspecified injury of head, initial encounter: Secondary | ICD-10-CM | POA: Diagnosis not present

## 2014-09-05 DIAGNOSIS — M25551 Pain in right hip: Secondary | ICD-10-CM | POA: Diagnosis not present

## 2014-09-05 DIAGNOSIS — Z6820 Body mass index (BMI) 20.0-20.9, adult: Secondary | ICD-10-CM | POA: Diagnosis not present

## 2014-09-05 DIAGNOSIS — I6340 Cerebral infarction due to embolism of unspecified cerebral artery: Secondary | ICD-10-CM | POA: Diagnosis not present

## 2014-09-05 DIAGNOSIS — R748 Abnormal levels of other serum enzymes: Secondary | ICD-10-CM | POA: Diagnosis present

## 2014-09-05 DIAGNOSIS — K219 Gastro-esophageal reflux disease without esophagitis: Secondary | ICD-10-CM | POA: Diagnosis present

## 2014-09-05 DIAGNOSIS — G459 Transient cerebral ischemic attack, unspecified: Secondary | ICD-10-CM | POA: Diagnosis not present

## 2014-09-05 DIAGNOSIS — I82431 Acute embolism and thrombosis of right popliteal vein: Secondary | ICD-10-CM | POA: Diagnosis not present

## 2014-09-05 DIAGNOSIS — E785 Hyperlipidemia, unspecified: Secondary | ICD-10-CM | POA: Diagnosis not present

## 2014-09-05 DIAGNOSIS — E1142 Type 2 diabetes mellitus with diabetic polyneuropathy: Secondary | ICD-10-CM | POA: Diagnosis not present

## 2014-09-05 DIAGNOSIS — E440 Moderate protein-calorie malnutrition: Secondary | ICD-10-CM | POA: Insufficient documentation

## 2014-09-05 DIAGNOSIS — I619 Nontraumatic intracerebral hemorrhage, unspecified: Secondary | ICD-10-CM | POA: Insufficient documentation

## 2014-09-05 DIAGNOSIS — Z043 Encounter for examination and observation following other accident: Secondary | ICD-10-CM | POA: Diagnosis not present

## 2014-09-05 DIAGNOSIS — Z8673 Personal history of transient ischemic attack (TIA), and cerebral infarction without residual deficits: Secondary | ICD-10-CM | POA: Diagnosis not present

## 2014-09-05 DIAGNOSIS — S79911A Unspecified injury of right hip, initial encounter: Secondary | ICD-10-CM | POA: Diagnosis not present

## 2014-09-05 DIAGNOSIS — I618 Other nontraumatic intracerebral hemorrhage: Secondary | ICD-10-CM | POA: Diagnosis present

## 2014-09-05 DIAGNOSIS — R402142 Coma scale, eyes open, spontaneous, at arrival to emergency department: Secondary | ICD-10-CM | POA: Diagnosis present

## 2014-09-05 DIAGNOSIS — I69991 Dysphagia following unspecified cerebrovascular disease: Secondary | ICD-10-CM | POA: Diagnosis not present

## 2014-09-05 DIAGNOSIS — Q211 Atrial septal defect: Secondary | ICD-10-CM

## 2014-09-05 DIAGNOSIS — J9690 Respiratory failure, unspecified, unspecified whether with hypoxia or hypercapnia: Secondary | ICD-10-CM | POA: Diagnosis not present

## 2014-09-05 DIAGNOSIS — I10 Essential (primary) hypertension: Secondary | ICD-10-CM | POA: Diagnosis present

## 2014-09-05 DIAGNOSIS — I82409 Acute embolism and thrombosis of unspecified deep veins of unspecified lower extremity: Secondary | ICD-10-CM

## 2014-09-05 DIAGNOSIS — R1311 Dysphagia, oral phase: Secondary | ICD-10-CM | POA: Diagnosis not present

## 2014-09-05 DIAGNOSIS — R4182 Altered mental status, unspecified: Secondary | ICD-10-CM | POA: Diagnosis not present

## 2014-09-05 DIAGNOSIS — R0902 Hypoxemia: Secondary | ICD-10-CM

## 2014-09-05 DIAGNOSIS — J9601 Acute respiratory failure with hypoxia: Secondary | ICD-10-CM | POA: Diagnosis not present

## 2014-09-05 DIAGNOSIS — E119 Type 2 diabetes mellitus without complications: Secondary | ICD-10-CM | POA: Diagnosis not present

## 2014-09-05 DIAGNOSIS — R531 Weakness: Secondary | ICD-10-CM | POA: Diagnosis not present

## 2014-09-05 DIAGNOSIS — I639 Cerebral infarction, unspecified: Secondary | ICD-10-CM | POA: Diagnosis not present

## 2014-09-05 DIAGNOSIS — I82442 Acute embolism and thrombosis of left tibial vein: Secondary | ICD-10-CM | POA: Diagnosis present

## 2014-09-05 DIAGNOSIS — Z7982 Long term (current) use of aspirin: Secondary | ICD-10-CM

## 2014-09-05 DIAGNOSIS — I611 Nontraumatic intracerebral hemorrhage in hemisphere, cortical: Secondary | ICD-10-CM | POA: Diagnosis not present

## 2014-09-05 DIAGNOSIS — R402242 Coma scale, best verbal response, confused conversation, at arrival to emergency department: Secondary | ICD-10-CM | POA: Diagnosis present

## 2014-09-05 DIAGNOSIS — R0789 Other chest pain: Secondary | ICD-10-CM | POA: Diagnosis not present

## 2014-09-05 DIAGNOSIS — R0901 Asphyxia: Secondary | ICD-10-CM | POA: Diagnosis not present

## 2014-09-05 DIAGNOSIS — D72829 Elevated white blood cell count, unspecified: Secondary | ICD-10-CM | POA: Diagnosis present

## 2014-09-05 DIAGNOSIS — I253 Aneurysm of heart: Secondary | ICD-10-CM | POA: Diagnosis not present

## 2014-09-05 DIAGNOSIS — S199XXA Unspecified injury of neck, initial encounter: Secondary | ICD-10-CM | POA: Diagnosis not present

## 2014-09-05 DIAGNOSIS — R1310 Dysphagia, unspecified: Secondary | ICD-10-CM | POA: Diagnosis not present

## 2014-09-05 DIAGNOSIS — W19XXXA Unspecified fall, initial encounter: Secondary | ICD-10-CM

## 2014-09-05 DIAGNOSIS — I63411 Cerebral infarction due to embolism of right middle cerebral artery: Secondary | ICD-10-CM | POA: Diagnosis not present

## 2014-09-05 DIAGNOSIS — I6601 Occlusion and stenosis of right middle cerebral artery: Secondary | ICD-10-CM | POA: Diagnosis not present

## 2014-09-05 DIAGNOSIS — D649 Anemia, unspecified: Secondary | ICD-10-CM | POA: Diagnosis not present

## 2014-09-05 DIAGNOSIS — R29898 Other symptoms and signs involving the musculoskeletal system: Secondary | ICD-10-CM | POA: Diagnosis not present

## 2014-09-05 DIAGNOSIS — M1990 Unspecified osteoarthritis, unspecified site: Secondary | ICD-10-CM | POA: Diagnosis not present

## 2014-09-05 DIAGNOSIS — I69354 Hemiplegia and hemiparesis following cerebral infarction affecting left non-dominant side: Secondary | ICD-10-CM | POA: Diagnosis not present

## 2014-09-05 HISTORY — PX: RADIOLOGY WITH ANESTHESIA: SHX6223

## 2014-09-05 LAB — I-STAT ARTERIAL BLOOD GAS, ED
ACID-BASE DEFICIT: 5 mmol/L — AB (ref 0.0–2.0)
BICARBONATE: 19.8 meq/L — AB (ref 20.0–24.0)
O2 Saturation: 86 %
PO2 ART: 52 mmHg — AB (ref 80.0–100.0)
Patient temperature: 98.6
TCO2: 21 mmol/L (ref 0–100)
pCO2 arterial: 33.6 mmHg — ABNORMAL LOW (ref 35.0–45.0)
pH, Arterial: 7.378 (ref 7.350–7.450)

## 2014-09-05 LAB — I-STAT CHEM 8, ED
BUN: 32 mg/dL — ABNORMAL HIGH (ref 6–23)
CALCIUM ION: 1.16 mmol/L (ref 1.13–1.30)
CREATININE: 0.8 mg/dL (ref 0.50–1.10)
Chloride: 97 mmol/L (ref 96–112)
Glucose, Bld: 510 mg/dL — ABNORMAL HIGH (ref 70–99)
HCT: 45 % (ref 36.0–46.0)
HEMOGLOBIN: 15.3 g/dL — AB (ref 12.0–15.0)
Potassium: 4.5 mmol/L (ref 3.5–5.1)
SODIUM: 130 mmol/L — AB (ref 135–145)
TCO2: 20 mmol/L (ref 0–100)

## 2014-09-05 LAB — APTT: APTT: 28 s (ref 24–37)

## 2014-09-05 LAB — COMPREHENSIVE METABOLIC PANEL
ALT: 23 U/L (ref 0–35)
AST: 30 U/L (ref 0–37)
Albumin: 3.6 g/dL (ref 3.5–5.2)
Alkaline Phosphatase: 71 U/L (ref 39–117)
Anion gap: 12 (ref 5–15)
BUN: 27 mg/dL — ABNORMAL HIGH (ref 6–23)
CHLORIDE: 95 mmol/L — AB (ref 96–112)
CO2: 22 mmol/L (ref 19–32)
CREATININE: 1.02 mg/dL (ref 0.50–1.10)
Calcium: 9 mg/dL (ref 8.4–10.5)
GFR calc non Af Amer: 52 mL/min — ABNORMAL LOW (ref >90)
GFR, EST AFRICAN AMERICAN: 60 mL/min — AB (ref >90)
Glucose, Bld: 518 mg/dL — ABNORMAL HIGH (ref 70–99)
POTASSIUM: 4.4 mmol/L (ref 3.5–5.1)
Sodium: 129 mmol/L — ABNORMAL LOW (ref 135–145)
Total Bilirubin: 0.5 mg/dL (ref 0.3–1.2)
Total Protein: 6.3 g/dL (ref 6.0–8.3)

## 2014-09-05 LAB — POCT I-STAT 7, (LYTES, BLD GAS, ICA,H+H)
Acid-base deficit: 5 mmol/L — ABNORMAL HIGH (ref 0.0–2.0)
BICARBONATE: 19.9 meq/L — AB (ref 20.0–24.0)
Calcium, Ion: 1.22 mmol/L (ref 1.13–1.30)
HCT: 36 % (ref 36.0–46.0)
Hemoglobin: 12.2 g/dL (ref 12.0–15.0)
O2 Saturation: 92 %
PCO2 ART: 37.4 mmHg (ref 35.0–45.0)
PH ART: 7.333 — AB (ref 7.350–7.450)
PO2 ART: 69 mmHg — AB (ref 80.0–100.0)
Potassium: 3.8 mmol/L (ref 3.5–5.1)
Sodium: 130 mmol/L — ABNORMAL LOW (ref 135–145)
TCO2: 21 mmol/L (ref 0–100)

## 2014-09-05 LAB — PROTIME-INR
INR: 1.07 (ref 0.00–1.49)
PROTHROMBIN TIME: 14 s (ref 11.6–15.2)

## 2014-09-05 LAB — URINALYSIS, ROUTINE W REFLEX MICROSCOPIC
BILIRUBIN URINE: NEGATIVE
Hgb urine dipstick: NEGATIVE
Ketones, ur: 15 mg/dL — AB
Leukocytes, UA: NEGATIVE
Nitrite: NEGATIVE
PH: 6 (ref 5.0–8.0)
PROTEIN: NEGATIVE mg/dL
Specific Gravity, Urine: 1.023 (ref 1.005–1.030)
UROBILINOGEN UA: 0.2 mg/dL (ref 0.0–1.0)

## 2014-09-05 LAB — DIFFERENTIAL
Basophils Absolute: 0 10*3/uL (ref 0.0–0.1)
Basophils Relative: 0 % (ref 0–1)
Eosinophils Absolute: 0 10*3/uL (ref 0.0–0.7)
Eosinophils Relative: 0 % (ref 0–5)
LYMPHS ABS: 2.1 10*3/uL (ref 0.7–4.0)
Lymphocytes Relative: 9 % — ABNORMAL LOW (ref 12–46)
MONO ABS: 2 10*3/uL — AB (ref 0.1–1.0)
Monocytes Relative: 8 % (ref 3–12)
NEUTROS PCT: 83 % — AB (ref 43–77)
Neutro Abs: 19.4 10*3/uL — ABNORMAL HIGH (ref 1.7–7.7)

## 2014-09-05 LAB — CBC
HEMATOCRIT: 38.8 % (ref 36.0–46.0)
HEMOGLOBIN: 13.6 g/dL (ref 12.0–15.0)
MCH: 29.4 pg (ref 26.0–34.0)
MCHC: 35.1 g/dL (ref 30.0–36.0)
MCV: 84 fL (ref 78.0–100.0)
PLATELETS: 154 10*3/uL (ref 150–400)
RBC: 4.62 MIL/uL (ref 3.87–5.11)
RDW: 12.9 % (ref 11.5–15.5)
WBC: 23.6 10*3/uL — ABNORMAL HIGH (ref 4.0–10.5)

## 2014-09-05 LAB — TROPONIN I
Troponin I: 0.18 ng/mL — ABNORMAL HIGH (ref <0.031)
Troponin I: 1.15 ng/mL (ref <0.031)
Troponin I: 1.34 ng/mL (ref <0.031)

## 2014-09-05 LAB — URINE MICROSCOPIC-ADD ON

## 2014-09-05 LAB — RAPID URINE DRUG SCREEN, HOSP PERFORMED
Amphetamines: NOT DETECTED
BENZODIAZEPINES: NOT DETECTED
Barbiturates: NOT DETECTED
Cocaine: NOT DETECTED
Opiates: NOT DETECTED
TETRAHYDROCANNABINOL: NOT DETECTED

## 2014-09-05 LAB — I-STAT TROPONIN, ED: Troponin i, poc: 0.14 ng/mL (ref 0.00–0.08)

## 2014-09-05 LAB — ETHANOL

## 2014-09-05 LAB — TRIGLYCERIDES: Triglycerides: 203 mg/dL — ABNORMAL HIGH (ref <150)

## 2014-09-05 LAB — GLUCOSE, CAPILLARY
Glucose-Capillary: 357 mg/dL — ABNORMAL HIGH (ref 70–99)
Glucose-Capillary: 315 mg/dL — ABNORMAL HIGH (ref 70–99)
Glucose-Capillary: 298 mg/dL — ABNORMAL HIGH (ref 70–99)

## 2014-09-05 LAB — MRSA PCR SCREENING: MRSA by PCR: NEGATIVE

## 2014-09-05 LAB — CK: CK TOTAL: 89 U/L (ref 7–177)

## 2014-09-05 SURGERY — RADIOLOGY WITH ANESTHESIA
Anesthesia: General

## 2014-09-05 MED ORDER — IOHEXOL 300 MG/ML  SOLN
150.0000 mL | Freq: Once | INTRAMUSCULAR | Status: AC | PRN
  Administered 2014-09-05: 150 mL

## 2014-09-05 MED ORDER — SODIUM CHLORIDE 0.9 % IV BOLUS (SEPSIS)
1000.0000 mL | Freq: Once | INTRAVENOUS | Status: AC
  Administered 2014-09-05: 1000 mL via INTRAVENOUS

## 2014-09-05 MED ORDER — SUCCINYLCHOLINE CHLORIDE 20 MG/ML IJ SOLN
INTRAMUSCULAR | Status: DC | PRN
  Administered 2014-09-05: 120 mg via INTRAVENOUS

## 2014-09-05 MED ORDER — ROCURONIUM BROMIDE 100 MG/10ML IV SOLN
INTRAVENOUS | Status: DC | PRN
  Administered 2014-09-05 (×4): 10 mg via INTRAVENOUS
  Administered 2014-09-05 (×2): 20 mg via INTRAVENOUS

## 2014-09-05 MED ORDER — IOHEXOL 350 MG/ML SOLN
100.0000 mL | Freq: Once | INTRAVENOUS | Status: AC | PRN
  Administered 2014-09-05: 100 mL via INTRAVENOUS

## 2014-09-05 MED ORDER — HYDROMORPHONE HCL 1 MG/ML IJ SOLN: 0.2500 mg | INTRAMUSCULAR | Status: DC | PRN

## 2014-09-05 MED ORDER — FENTANYL CITRATE 0.05 MG/ML IJ SOLN
INTRAMUSCULAR | Status: DC | PRN
  Administered 2014-09-05: 50 ug via INTRAVENOUS
  Administered 2014-09-05 (×2): 100 ug via INTRAVENOUS
  Administered 2014-09-05 (×2): 50 ug via INTRAVENOUS

## 2014-09-05 MED ORDER — CETYLPYRIDINIUM CHLORIDE 0.05 % MT LIQD
7.0000 mL | Freq: Four times a day (QID) | OROMUCOSAL | Status: DC
  Administered 2014-09-05 – 2014-09-07 (×7): 7 mL via OROMUCOSAL

## 2014-09-05 MED ORDER — LIDOCAINE HCL 1 % IJ SOLN
INTRAMUSCULAR | Status: AC
  Filled 2014-09-05: qty 20

## 2014-09-05 MED ORDER — PROPOFOL 10 MG/ML IV EMUL
INTRAVENOUS | Status: AC
  Administered 2014-09-05: 30 ug/kg/min via INTRAVENOUS
  Filled 2014-09-05: qty 100

## 2014-09-05 MED ORDER — INSULIN ASPART 100 UNIT/ML ~~LOC~~ SOLN: 1.0000 [IU] | SUBCUTANEOUS | Status: DC

## 2014-09-05 MED ORDER — ACETAMINOPHEN 650 MG RE SUPP: 650.0000 mg | Freq: Four times a day (QID) | RECTAL | Status: DC | PRN

## 2014-09-05 MED ORDER — CHLORHEXIDINE GLUCONATE 0.12 % MT SOLN
15.0000 mL | Freq: Two times a day (BID) | OROMUCOSAL | Status: DC
  Administered 2014-09-05 – 2014-09-06 (×2): 15 mL via OROMUCOSAL
  Filled 2014-09-05 (×2): qty 15

## 2014-09-05 MED ORDER — LABETALOL HCL 5 MG/ML IV SOLN: 10.0000 mg | INTRAVENOUS | Status: DC | PRN

## 2014-09-05 MED ORDER — ALBUTEROL SULFATE (2.5 MG/3ML) 0.083% IN NEBU: 2.5000 mg | INHALATION_SOLUTION | RESPIRATORY_TRACT | Status: DC | PRN

## 2014-09-05 MED ORDER — DOCUSATE SODIUM 100 MG PO CAPS
100.0000 mg | ORAL_CAPSULE | Freq: Two times a day (BID) | ORAL | Status: DC | PRN
  Filled 2014-09-05: qty 1

## 2014-09-05 MED ORDER — ACETAMINOPHEN 650 MG RE SUPP: 650.0000 mg | RECTAL | Status: DC | PRN

## 2014-09-05 MED ORDER — ABCIXIMAB 2 MG/ML IV SOLN
0.2500 mg/kg | Freq: Once | INTRAVENOUS | Status: DC
Start: 2014-09-05 — End: 2014-09-06
  Filled 2014-09-05: qty 10

## 2014-09-05 MED ORDER — ONDANSETRON HCL 4 MG/2ML IJ SOLN: 4.0000 mg | Freq: Once | INTRAMUSCULAR | Status: AC | PRN

## 2014-09-05 MED ORDER — OXYCODONE HCL 5 MG/5ML PO SOLN: 5.0000 mg | Freq: Once | ORAL | Status: DC | PRN

## 2014-09-05 MED ORDER — SODIUM CHLORIDE 0.9 % IV SOLN
INTRAVENOUS | Status: DC | PRN
  Administered 2014-09-05: 12:00:00 via INTRAVENOUS

## 2014-09-05 MED ORDER — EPTIFIBATIDE 2 MG/ML IV SOLN
3.0000 mL | Freq: Once | INTRAVENOUS | Status: AC
  Administered 2014-09-05 (×3): 1 mL via INTRAVENOUS

## 2014-09-05 MED ORDER — SENNOSIDES-DOCUSATE SODIUM 8.6-50 MG PO TABS
1.0000 | ORAL_TABLET | Freq: Every evening | ORAL | Status: DC | PRN
  Filled 2014-09-05: qty 1

## 2014-09-05 MED ORDER — PROPOFOL 10 MG/ML IV EMUL
0.0000 ug/kg/min | INTRAVENOUS | Status: DC
  Administered 2014-09-05: 30 ug/kg/min via INTRAVENOUS
  Administered 2014-09-05: 35 ug/kg/min via INTRAVENOUS
  Administered 2014-09-06: 20 ug/kg/min via INTRAVENOUS
  Filled 2014-09-05 (×2): qty 100

## 2014-09-05 MED ORDER — ONDANSETRON HCL 4 MG/2ML IJ SOLN: 4.0000 mg | Freq: Four times a day (QID) | INTRAMUSCULAR | Status: DC | PRN

## 2014-09-05 MED ORDER — FENTANYL CITRATE 0.05 MG/ML IJ SOLN
50.0000 ug | INTRAMUSCULAR | Status: DC | PRN
  Administered 2014-09-05: 50 ug via INTRAVENOUS

## 2014-09-05 MED ORDER — STROKE: EARLY STAGES OF RECOVERY BOOK
Freq: Once | Status: AC
  Administered 2014-09-05: 16:00:00
  Filled 2014-09-05: qty 1

## 2014-09-05 MED ORDER — SODIUM CHLORIDE 0.9 % IV SOLN
INTRAVENOUS | Status: DC
  Administered 2014-09-05: 15:00:00 via INTRAVENOUS

## 2014-09-05 MED ORDER — LIDOCAINE HCL (CARDIAC) 20 MG/ML IV SOLN
INTRAVENOUS | Status: DC | PRN
  Administered 2014-09-05: 50 mg via INTRAVENOUS

## 2014-09-05 MED ORDER — PANTOPRAZOLE SODIUM 40 MG IV SOLR
40.0000 mg | Freq: Every day | INTRAVENOUS | Status: DC
  Administered 2014-09-05: 40 mg via INTRAVENOUS
  Filled 2014-09-05 (×2): qty 40

## 2014-09-05 MED ORDER — OXYCODONE HCL 5 MG PO TABS: 5.0000 mg | ORAL_TABLET | Freq: Once | ORAL | Status: DC | PRN

## 2014-09-05 MED ORDER — NITROGLYCERIN 1 MG/10 ML FOR IR/CATH LAB
INTRA_ARTERIAL | Status: AC
  Administered 2014-09-05: 13:00:00
  Filled 2014-09-05: qty 10

## 2014-09-05 MED ORDER — ACETAMINOPHEN 500 MG PO TABS: 1000.0000 mg | ORAL_TABLET | Freq: Four times a day (QID) | ORAL | Status: DC | PRN

## 2014-09-05 MED ORDER — PHENYLEPHRINE HCL 10 MG/ML IJ SOLN
10.0000 mg | INTRAVENOUS | Status: DC | PRN
  Administered 2014-09-05: 50 ug/min via INTRAVENOUS

## 2014-09-05 MED ORDER — FENTANYL CITRATE 0.05 MG/ML IJ SOLN
50.0000 ug | INTRAMUSCULAR | Status: DC | PRN
  Administered 2014-09-06 (×2): 50 ug via INTRAVENOUS
  Filled 2014-09-05 (×3): qty 2

## 2014-09-05 MED ORDER — SODIUM CHLORIDE 0.9 % IV SOLN
INTRAVENOUS | Status: DC
  Administered 2014-09-05: 3.3 [IU]/h via INTRAVENOUS
  Filled 2014-09-05: qty 2.5

## 2014-09-05 MED ORDER — PROPOFOL INFUSION 10 MG/ML OPTIME
INTRAVENOUS | Status: DC | PRN
  Administered 2014-09-05: 50 ug/kg/min via INTRAVENOUS

## 2014-09-05 MED ORDER — SODIUM CHLORIDE 0.9 % IV SOLN: INTRAVENOUS | Status: DC

## 2014-09-05 MED ORDER — PROPOFOL 10 MG/ML IV BOLUS
INTRAVENOUS | Status: DC | PRN
  Administered 2014-09-05: 150 mg via INTRAVENOUS
  Administered 2014-09-05: 30 mg via INTRAVENOUS

## 2014-09-05 MED ORDER — NICARDIPINE HCL IN NACL 20-0.86 MG/200ML-% IV SOLN
5.0000 mg/h | INTRAVENOUS | Status: DC
  Administered 2014-09-05: 7.5 mg/h via INTRAVENOUS
  Administered 2014-09-05: 5 mg/h via INTRAVENOUS
  Filled 2014-09-05 (×2): qty 200

## 2014-09-05 MED ORDER — SODIUM BICARBONATE 8.4 % IV SOLN
INTRAVENOUS | Status: DC | PRN
  Administered 2014-09-05: 50 meq via INTRAVENOUS

## 2014-09-05 MED ORDER — ACETAMINOPHEN 325 MG PO TABS: 650.0000 mg | ORAL_TABLET | ORAL | Status: DC | PRN

## 2014-09-05 NOTE — Code Documentation (Signed)
Code stroke called at 0739, Patient arrived to [name] ED via [facility].  Patient hypoxic and on NRB.  AS per husband, LSN 2000 09/04/2014 and he woke up at 4 am and she was not in the bed so he went to find her and found her down in the bathroom moaning, with left side weakness.  [id number].  CBG 500, sats 88-89% on NRB.  ABG done.  Patient to have CTA and perfusion scan.

## 2014-09-05 NOTE — Consult Note (Signed)
PULMONARY / CRITICAL CARE MEDICINE   Name: Christy Simmons MRN: [id number] DOB: 1935-06-03    ADMISSION DATE:  09/05/2014 CONSULTATION DATE:  09/05/14  REFERRING MD :  Leonie Man Towanda Malkin   CHIEF COMPLAINT:  Code Stroke   INITIAL PRESENTATION: 80 yo WF admitted with embolic CVA right basal ganglia. CTA showed right MCA occlusion s/p IR for revascularization . PCCM consulted for vent management .    STUDIES:  2/6 CT brain showed an area of well defined area of hypodensity around the right basal ganglia. 2/6 CTA brain > distal right M1 occlusion, abn perfusion right MCA  CT head 2/6 ~(1400) >R MCA revasularization/no hemorrahage.   SIGNIFICANT EVENTS: 2/6 s/p revascularization of occluded R. MCA    HISTORY OF PRESENT ILLNESS:   Christy Simmons is an 80 y.o. female with a past medical history significant for HTN, hyperlipidemia, DM, TIA, PFO with atrial septal aneurysm, brought in via EMS as a code stroke due to acute stroke symptoms on 2/6.  Patient was last known well around 8 pm last night on 2/5 . Husband stated that she hasn't been feeling well in the last month, and this morning he found her in the bathroom around 6 or 6:30 unable to move the left side and not fully responsive. He thinks that she got up to to the bathroom probably around 4 or 4:30 am. Upon arrival to the ED she was able to answer simple questions, and had NIHSS 16. CT brain showed an area of well defined area of hypodensity around the right basal ganglia. CTA brain > distal right M1 occlusion, abn perfusion right MCA . Pt was taken to IR . Underwent complete revascularziation of right MCA . Follow up CT head showed >R MCA revasularization/no hemorrahage.  Pt brought back to ICU on vent . PCCM consulted for vent management .     PAST MEDICAL HISTORY :   has a past medical history of Transient ischemic attack; HTN (hypertension); Hyperlipidemia; DM (diabetes mellitus); GERD (gastroesophageal reflux disease); Heart disease; Amnesia  (01/10/2013); Patent foramen ovale with atrial septal aneurysm; Complication of anesthesia; PONV (postoperative nausea and vomiting); Family history of anesthesia complication; Glaucoma; Early cataracts, bilateral; and Arthritis.  has past surgical history that includes US echocardiography (10-13-11); Back surgery; Tonsillectomy; TEE without cardioversion (10/30/2011); Colonoscopy w/ biopsies and polypectomy; Tonsillectomy; Tubal ligation; and Lumbar laminectomy/decompression microdiscectomy (N/A, 09/11/2013). Prior to Admission medications   Medication Sig Start Date End Date Taking? Authorizing Provider  aspirin EC 81 MG tablet Take 81 mg by mouth daily.   Yes Historical Provider, MD  brimonidine (ALPHAGAN) 0.2 % ophthalmic solution Place 1 drop into both eyes 2 (two) times daily.  11/18/12  Yes Historical Provider, MD  Calcium Citrate-Vitamin D 250-200 MG-UNIT TABS Take 1 tablet by mouth daily.   Yes Historical Provider, MD  Cholecalciferol (VITAMIN D3) 1000 UNITS CAPS Take 1,000 Units by mouth daily.   Yes Historical Provider, MD  esomeprazole (NEXIUM) 20 MG capsule Take 20 mg by mouth daily at 12 noon.   Yes Historical Provider, MD  ibuprofen (ADVIL,MOTRIN) 200 MG tablet Take 200 mg by mouth daily as needed for mild pain.    Yes Historical Provider, MD  insulin NPH Human (HUMULIN N,NOVOLIN N) 100 UNIT/ML injection Inject 15 Units into the skin. Inject 15 units twice daily   Yes Historical Provider, MD  insulin regular (NOVOLIN R,HUMULIN R) 100 units/mL injection Inject 10-20 Units into the skin 3 (three) times daily before meals. Take 10 units  with meals if glucose is <200. Take 20 units with meals if glucose is >200.   Yes Historical Provider, MD  lisinopril-hydrochlorothiazide (PRINZIDE,ZESTORETIC) 20-25 MG per tablet Take 1 tablet by mouth daily.   Yes Historical Provider, MD  Magnesium 200 MG TABS Take 400 mg by mouth daily.   Yes Historical Provider, MD  metoCLOPramide (REGLAN) 5 MG tablet Take 5  mg by mouth 4 (four) times daily -  before meals and at bedtime.    Yes Historical Provider, MD  Misc Natural Products (OSTEO BI-FLEX ADV JOINT SHIELD) TABS Take 1 tablet by mouth daily.   Yes Historical Provider, MD  Multiple Vitamin (MULITIVITAMIN WITH MINERALS) TABS Take 1 tablet by mouth daily.   Yes Historical Provider, MD  traMADol (ULTRAM) 50 MG tablet Take 50-100 mg by mouth 2 (two) times daily as needed for moderate pain.  11/20/13  Yes Historical Provider, MD  aluminum-magnesium hydroxide-simethicone (MAALOX) 732-202-54 MG/5ML SUSP Take 200 mLs by mouth 2 (two) times daily with a meal.    Historical Provider, MD  diazepam (VALIUM) 5 MG tablet Take 1 tablet (5 mg total) by mouth every 6 (six) hours as needed for muscle spasms. 09/13/13   Consuella Lose, MD  insulin aspart (NOVOLOG) 100 UNIT/ML injection Inject 12-14 Units into the skin 2 (two) times daily. Per sliding scale *takes in am and at noon*    Historical Provider, MD  insulin glargine (LANTUS) 100 UNIT/ML injection Inject 20 Units into the skin every morning.    Historical Provider, MD  oxyCODONE-acetaminophen (PERCOCET/ROXICET) 5-325 MG per tablet Take 1-2 tablets by mouth every 4 (four) hours as needed for moderate pain. 09/13/13   Consuella Lose, MD   Allergies  Allergen Reactions  . Gabapentin     Caused depression  . Codeine     Headache    FAMILY HISTORY:  indicated that her mother is deceased. She indicated that her father is deceased. She indicated that her maternal grandmother is deceased.  SOCIAL HISTORY:  reports that she has never smoked. She has never used smokeless tobacco. She reports that she does not drink alcohol or use illicit drugs.  REVIEW OF SYSTEMS:  Sedated on vent   SUBJECTIVE: Code stroke , on vent   VITAL SIGNS: Temp:  [97.1 F (36.2 C)] 97.1 F (36.2 C) (02/06 0842) Pulse Rate:  [88-105] 105 (02/06 1124) Resp:  [14-21] 16 (02/06 1124) BP: (133-167)/(73-90) 167/90 mmHg (02/06  1124) SpO2:  [83 %-98 %] 96 % (02/06 1124) FiO2 (%):  [100 %] 100 % (02/06 0920) Weight:  [52.164 kg (115 lb)] 52.164 kg (115 lb) (02/06 0739) HEMODYNAMICS:   VENTILATOR SETTINGS: Vent Mode:  [-]  FiO2 (%):  [100 %] 100 % INTAKE / OUTPUT:  Intake/Output Summary (Last 24 hours) at 09/05/14 1441 Last data filed at 09/05/14 1328  Gross per 24 hour  Intake    600 ml  Output   1000 ml  Net   -400 ml    PHYSICAL EXAMINATION: General:  Sedated on vent  Neuro:  Sedated  HEENT:  ETT  Cardiovascular:  RRR , no m/r/g  Lungs:  Decreased BS in bases  Abdomen:  Soft, BS hypoactive  Musculoskeletal:  Intact  Skin:  Clear   LABS:  CBC  Recent Labs Lab 09/05/14 0744 09/05/14 0755 09/05/14 1302  WBC 23.6*  --   --   HGB 13.6 15.3* 12.2  HCT 38.8 45.0 36.0  PLT 154  --   --    Coag's  Recent Labs Lab 09/05/14 0744  APTT 28  INR 1.07   BMET  Recent Labs Lab 09/05/14 0744 09/05/14 0755 09/05/14 1302  NA 129* 130* 130*  K 4.4 4.5 3.8  CL 95* 97  --   CO2 22  --   --   BUN 27* 32*  --   CREATININE 1.02 0.80  --   GLUCOSE 518* 510*  --    Electrolytes  Recent Labs Lab 09/05/14 0744  CALCIUM 9.0   Sepsis Markers No results for input(s): LATICACIDVEN, PROCALCITON, O2SATVEN in the last 168 hours. ABG  Recent Labs Lab 09/05/14 0821 09/05/14 1302  PHART 7.378 7.333*  PCO2ART 33.6* 37.4  PO2ART 52.0* 69.0*   Liver Enzymes  Recent Labs Lab 09/05/14 0744  AST 30  ALT 23  ALKPHOS 71  BILITOT 0.5  ALBUMIN 3.6   Cardiac Enzymes  Recent Labs Lab 09/05/14 0749  TROPONINI 0.18*   Glucose  Recent Labs Lab 09/05/14 1217  GLUCAP 357*    Imaging No results found.   ASSESSMENT / PLAN:  PULMONARY OETT 2/6 > A: Acute Respiratory Failure s/p acute stroke requiring intervention   P:   Full vent support  VAP  Daily WUA /SBT  Assess for wean as mental status improves.  Check cxr   CARDIOVASCULAR  A: HTN  Hyperlipidemia  PFO with  atrial septal aneurysm  Troponin elevation ? Demand   P:  Cardene IV  Labetolol As needed   2 D echo  Tr enzymes  Hold home b/p rx    RENAL A:   P:   Monitor and replace electrolytes as indicated.   GASTROINTESTINAL A:  NPO  P:   PPI  TF if not extubated 2/7    HEMATOLOGIC A:   P:  SCD  INFECTIOUS A:  Elevated WBC , no apparent source   P:   Monitor fever/wbc curve  Check cxr   ENDOCRINE A:  DM    P:   ICU SSI   NEUROLOGIC A: Right basal ganglia CVA -right MCA occlusion s/p thrombectomy w/ revascularization  P:   RASS goal: -1 to 0  Cont Propofol     FAMILY  - Updates: no family at bedside   - Inter-disciplinary family meet or Palliative Care meeting due by:  2/13     TODAY'S SUMMARY:       Pulmonary and [place] Pager: [phone]  09/05/2014, 2:41 PM

## 2014-09-05 NOTE — Progress Notes (Signed)
Pt brought to IR suit accompanied by RN and IR tech on non re-breather . She c/o pain to her hip and shoulder and appeared anxious. She was met by the MD and CRNA. Her husband later accompanied her in the suite and spoke with the MD.

## 2014-09-05 NOTE — ED Notes (Signed)
Pt. Transferred to CT scan accompanied by Ed, RN

## 2014-09-05 NOTE — ED Notes (Addendum)
Per EMS- pt had a physical on the 3rd that she passed. Pt got up to use the bathroom about two hours ago and husband noticed she never came back. He went into to check on her and she had fallen between the toilet and the wall and was moaning. Husband called EMS. Pt was noted to have some left sided deficits. PIV 18 G RAC placed. Unable to see out of left side.

## 2014-09-05 NOTE — Consult Note (Signed)
Referring Physician: ED    Chief Complaint: code stroke, left hemiparesis, decreased responsiveness.  HPI:                                                                                                                                         Christy Simmons is an 80 y.o. female with a past medical history significant for HTN, hyperlipidemia, DM, TIA, PFO with atrial septal aneurysm, brought in via EMS as a code stroke due to acute onset of the above symptoms.  Patient was last known well around 8 pm last night. Husband stated that she hasn't been feeling well in the last month, and this morning he found her in the bathroom around 6 or 6:30 unable to move the left side and not fully responsive. He thinks that she got up to to the bathroom probably around 4 or 4:30 am. Upon arrival to the ED she was able to answer simple questions, and had NIHSS 16. CT brain showed an area of well defined area of hypodensity around the right basal ganglia.   Date last known well: 09/04/14 Time last known well: 8 pm tPA Given: no, out of the window for IV tpa, but patient taken to the IR suite for endovascular treatment. NIHSS: 16   Past Medical History  Diagnosis Date  . Transient ischemic attack   . HTN (hypertension)   . Hyperlipidemia   . DM (diabetes mellitus)   . GERD (gastroesophageal reflux disease)   . Heart disease     PFO  . Amnesia 01/10/2013  . Patent foramen ovale with atrial septal aneurysm   . Complication of anesthesia   . PONV (postoperative nausea and vomiting)   . Family history of anesthesia complication     "My brother has trouble waking up."  . Glaucoma     Hx: of  . Early cataracts, bilateral     Hx: of  . Arthritis     Past Surgical History  Procedure Laterality Date  . US echocardiography  10-13-11    EF 60-65% normal  . Back surgery      disc   . Tonsillectomy    . Tee without cardioversion  10/30/2011    Procedure: TRANSESOPHAGEAL ECHOCARDIOGRAM (TEE);  Surgeon: Larey Dresser, MD;  Location: [facility];  Service: Cardiovascular;  Laterality: N/A;  . Colonoscopy w/ biopsies and polypectomy      HX;of  . Tonsillectomy    . Tubal ligation    . Lumbar laminectomy/decompression microdiscectomy N/A 09/11/2013    Procedure: LUMBAR LAMINECTOMY/DECOMPRESSION MICRODISCECTOMY 2 LEVELS;  Surgeon: Ophelia Charter, MD;  Location: [facility];  Service: Neurosurgery;  Laterality: N/A;  Lumbar Four-Five Laminectomy and Foraminotomy with redo Lumbar Five-Sacral One diskectomy    Family History  Problem Relation Age of Onset  . Stroke      No family history of such  . Congenital  heart disease      No family history of such  . Anesthesia problems Neg Hx   . Diabetes Maternal Grandfather   Family history: no brain tumors, brain aneurysms, or epilepsy Social History:  reports that she has never smoked. She has never used smokeless tobacco. She reports that she does not drink alcohol or use illicit drugs.  Allergies:  Allergies  Allergen Reactions  . Gabapentin     Caused depression  . Codeine     Headache    Medications:                                                                                                                           I have reviewed the patient's current medications.  ROS: unable to obtain due to mental status                                                                                                                                       History obtained from chart review and husband.   Physical exam: pleasant female in no apparent distress. Blood pressure 143/76, pulse 91, resp. rate 16, height 5\' 3"  (1.6 m), weight 52.164 kg (115 lb), SpO2 89 %. Head: normocephalic. Neck: supple, no bruits, no JVD. Cardiac: no murmurs. Lungs: clear. Abdomen: soft, no tender, no mass. Extremities: no edema. Skin: no rash Neurologic Examination:                                                                                                       General: Mental Status: Alert, awake, oriented to year-place-month. No dysarthria or aphasia.  Able to follow simple commands without difficulty. Cranial Nerves: II: Discs flat bilaterally; Visual fields grossly normal, pupils equal, round, reactive to light and accommodation III,IV, VI: ptosis not present, extra-ocular motions intact bilaterally V,VII: smile symmetric, facial light touch sensation normal bilaterally VIII: hearing normal bilaterally IX,X: gag reflex present XI: bilateral shoulder shrug XII: midline tongue  extension without atrophy or fasciculations Motor: Dense left hemiparesis Tone increased in the left side, arm>leg Sensory: Pinprick and light touch intact throughout, bilaterally Deep Tendon Reflexes:  Right: Upper Extremity   Left: Upper extremity   biceps (C-5 to C-6) 2/4   biceps (C-5 to C-6) 2/4 tricep (C7) 2/4    triceps (C7) 2/4 Brachioradialis (C6) 2/4  Brachioradialis (C6) 2/4  Lower Extremity Lower Extremity  quadriceps (L-2 to L-4) 2/4   quadriceps (L-2 to L-4) 2/4 Achilles (S1) 2/4   Achilles (S1) 2/4  Plantars: Right: downgoing   Left: downgoing Cerebellar: Unable to perform Gait:  No tested due to multiple leads and safety reasons.   Results for orders placed or performed during the hospital encounter of 09/05/14 (from the past 48 hour(s))  Protime-INR     Status: None   Collection Time: 09/05/14  7:44 AM  Result Value Ref Range   Prothrombin Time 14.0 11.6 - 15.2 seconds   INR 1.07 0.00 - 1.49  APTT     Status: None   Collection Time: 09/05/14  7:44 AM  Result Value Ref Range   aPTT 28 24 - 37 seconds  CBC     Status: Abnormal   Collection Time: 09/05/14  7:44 AM  Result Value Ref Range   WBC 23.6 (H) 4.0 - 10.5 K/uL   RBC 4.62 3.87 - 5.11 MIL/uL   Hemoglobin 13.6 12.0 - 15.0 g/dL   HCT 38.8 36.0 - 46.0 %   MCV 84.0 78.0 - 100.0 fL   MCH 29.4 26.0 - 34.0 pg   MCHC 35.1 30.0 - 36.0 g/dL   RDW 12.9 11.5 - 15.5 %   Platelets 154  150 - 400 K/uL  Differential     Status: Abnormal   Collection Time: 09/05/14  7:44 AM  Result Value Ref Range   Neutrophils Relative % 83 (H) 43 - 77 %   Neutro Abs 19.4 (H) 1.7 - 7.7 K/uL   Lymphocytes Relative 9 (L) 12 - 46 %   Lymphs Abs 2.1 0.7 - 4.0 K/uL   Monocytes Relative 8 3 - 12 %   Monocytes Absolute 2.0 (H) 0.1 - 1.0 K/uL   Eosinophils Relative 0 0 - 5 %   Eosinophils Absolute 0.0 0.0 - 0.7 K/uL   Basophils Relative 0 0 - 1 %   Basophils Absolute 0.0 0.0 - 0.1 K/uL  I-Stat Troponin, ED (not at [facility])     Status: Abnormal   Collection Time: 09/05/14  7:53 AM  Result Value Ref Range   Troponin i, poc 0.14 (HH) 0.00 - 0.08 ng/mL   Comment NOTIFIED PHYSICIAN    Comment 3            Comment: Due to the release kinetics of cTnI, a negative result within the first hours of the onset of symptoms does not rule out myocardial infarction with certainty. If myocardial infarction is still suspected, repeat the test at appropriate intervals.   I-Stat Chem 8, ED     Status: Abnormal   Collection Time: 09/05/14  7:55 AM  Result Value Ref Range   Sodium 130 (L) 135 - 145 mmol/L   Potassium 4.5 3.5 - 5.1 mmol/L   Chloride 97 96 - 112 mmol/L   BUN 32 (H) 6 - 23 mg/dL   Creatinine, Ser 0.80 0.50 - 1.10 mg/dL   Glucose, Bld 510 (H) 70 - 99 mg/dL   Calcium, Ion 1.16 1.13 - 1.30 mmol/L   TCO2 20  0 - 100 mmol/L   Hemoglobin 15.3 (H) 12.0 - 15.0 g/dL   HCT 45.0 36.0 - 46.0 %  I-Stat arterial blood gas, ED     Status: Abnormal   Collection Time: 09/05/14  8:21 AM  Result Value Ref Range   pH, Arterial 7.378 7.350 - 7.450   pCO2 arterial 33.6 (L) 35.0 - 45.0 mmHg   pO2, Arterial 52.0 (L) 80.0 - 100.0 mmHg   Bicarbonate 19.8 (L) 20.0 - 24.0 mEq/L   TCO2 21 0 - 100 mmol/L   O2 Saturation 86.0 %   Acid-base deficit 5.0 (H) 0.0 - 2.0 mmol/L   Patient temperature 98.6 F    Collection site RADIAL, ALLEN'S TEST ACCEPTABLE    Drawn by Operator    Sample type ARTERIAL    No results  found.   Assessment: 80 y.o. female with a constellation of symptoms consistent with a right brain stroke. NIHSS 16, CT brain demonstrated an area of hypodensity right basal ganglia. She is out of the window for IV tpa, but will approach this case as a possible woke up stroke and thus I am odering CTA/CTP to determine whether or not there is an area of penumbra that can be salvage. She will be admitted and complete stroke work up. Stroke team will resume care tomorrow.   Stroke Risk Factors - age, HTN, DM, hyperlipidemia,TIA, PFO with atrial septal aneurysm  Plan: 1. HgbA1c, fasting lipid panel 2. MRI, MRA  of the brain without contrast 3. Echocardiogram 4. Carotid dopplers 5. Prophylactic therapy-as per post IA thrombolysis/thrombectomy protocol 6. Risk factor modification 7. Telemetry monitoring 8. Frequent neuro checks 9. PT/OT SLP   Dorian Pod, MD [facility] [phone]  09/05/2014, 8:30 AM  Addendum:  CTA brain: Distal right M1 occlusion. CTP: abnormal perfusion throughout essentially the entirety of theright MCA territory. Much of this appears to represent oligemic brain with relatively preserved cerebral blood volume, however there is likely a developing region of core infarct involving the deep white matter. Had an ample discussion with husband and I proposed to pursue endovascular intervention trying to prevent stroke worsening. He agreed and I called our interventional neuroradiologist who will proceed accordingly. Admit to the NICU. Critical care attending informed.  Dorian Pod, MD

## 2014-09-05 NOTE — Progress Notes (Signed)
CRITICAL VALUE ALERT  Critical value received:  Troponin 1.15  Date of notification:  09/05/2014  Time of notification:  5072  Critical value read back:Yes.    Nurse who received alert:  Viona Gilmore, RN  MD notified (1st page):  Dr. Tamala Julian  Time of first page: 43  MD notified (2nd page):  Time of second page:  Responding MD:  Dr. Tamala Julian with [employer] (Left message with Luellen Pucker)  Time MD responded:  1800

## 2014-09-05 NOTE — Transfer of Care (Signed)
Immediate Anesthesia Transfer of Care Note  Patient: Christy Simmons  Procedure(s) Performed: Procedure(s): RADIOLOGY WITH ANESTHESIA (N/A)  Patient Location: ICU  Anesthesia Type:General  Level of Consciousness: sedated, unresponsive and Patient remains intubated per anesthesia plan  Airway & Oxygen Therapy: Patient remains intubated per anesthesia plan and Patient placed on Ventilator (see vital sign flow sheet for setting)  Post-op Assessment: Report given to RN and Post -op Vital signs reviewed and stable  Post vital signs: Reviewed and stable  Last Vitals:  Filed Vitals:   09/05/14 1124  BP: 167/90  Pulse: 105  Temp:   Resp: 16    Complications: No apparent anesthesia complications

## 2014-09-05 NOTE — Anesthesia Preprocedure Evaluation (Addendum)
Anesthesia Evaluation  Patient identified by MRN, date of birth, ID band Patient confused    Reviewed: Unable to perform ROS - Chart review onlyPreop documentation limited or incomplete due to emergent nature of procedure.  History of Anesthesia Complications (+) PONV  Airway   TM Distance: >3 FB     Dental  (+) Upper Dentures, Partial Lower   Pulmonary  breath sounds clear to auscultation        Cardiovascular hypertension, Pt. on medications + Peripheral Vascular Disease Rhythm:Regular Rate:Normal  Echo 2013: Study Conclusions  - Left ventricle: The cavity size was normal. There was mild concentric hypertrophy. Systolic function was normal. The estimated ejection fraction was in the range of 60% to 65%. Wall motion was normal; there were no regional wall motion abnormalities. - Aortic valve: There was no stenosis. - Mitral valve: Trivial regurgitation. - Left atrium: The atrium was mildly dilated. No evidence of thrombus in the atrial cavity or appendage. - Right atrium: No evidence of thrombus in the atrial cavity or appendage. - Atrial septum: Atrial septal aneurysm with moderate to large PFO. Positive bubble study.    Neuro/Psych CVA    GI/Hepatic GERD-  ,  Endo/Other  diabetes, Poorly Controlled, Insulin Dependent  Renal/GU      Musculoskeletal   Abdominal   Peds  Hematology   Anesthesia Other Findings Code stroke today  Reproductive/Obstetrics                           Anesthesia Physical Anesthesia Plan  ASA: IV and emergent  Anesthesia Plan: General   Post-op Pain Management:    Induction: Intravenous, Rapid sequence and Cricoid pressure planned  Airway Management Planned: Oral ETT  Additional Equipment: Arterial line  Intra-op Plan:   Post-operative Plan: Post-operative intubation/ventilation  Informed Consent: I have reviewed the patients  History and Physical, chart, labs and discussed the procedure including the risks, benefits and alternatives for the proposed anesthesia with the patient or authorized representative who has indicated his/her understanding and acceptance.   History available from chart only and Only emergency history available  Plan Discussed with:   Anesthesia Plan Comments:         Anesthesia Quick Evaluation

## 2014-09-05 NOTE — Anesthesia Postprocedure Evaluation (Signed)
  Anesthesia Post-op Note  Patient: Christy Simmons  Procedure(s) Performed: Procedure(s): RADIOLOGY WITH ANESTHESIA (N/A)  Patient Location: ICU  Anesthesia Type:General  Level of Consciousness: sedated and Patient remains intubated per anesthesia plan  Airway and Oxygen Therapy: Patient remains intubated per anesthesia plan and Patient placed on Ventilator (see vital sign flow sheet for setting)  Post-op Pain: none  Post-op Assessment: Post-op Vital signs reviewed  Post-op Vital Signs: Reviewed  Last Vitals:  Filed Vitals:   09/05/14 1446  BP: 120/94  Pulse: 70  Temp: 36.3 C  Resp: 20    Complications: No apparent anesthesia complications

## 2014-09-05 NOTE — Progress Notes (Signed)
First puncture to right groin:1225

## 2014-09-05 NOTE — ED Notes (Signed)
Pt. Transferred to IR after x-ray.  Report given to RN in IR.  Pt.'s husband and family taken to the Radiology Waiting Room  Pt. 's husband updated by Dr. In IR.  Pt. 's belongings with family.

## 2014-09-05 NOTE — Evaluation (Signed)
SLP Cancellation Note  Patient Details Name: Christy Simmons MRN: [id number] DOB: [birth year]   Cancelled treatment:       Reason Eval/Treat Not Completed: Patient at procedure or test/unavailable  Luanna Salk, [facility] SLP [phone]

## 2014-09-05 NOTE — Progress Notes (Signed)
Re-vascularization: 1306

## 2014-09-05 NOTE — ED Provider Notes (Signed)
CSN: [id number]     Arrival date & time 09/05/14  0736 History   First MD Initiated Contact with Patient 09/05/14 [phone]     Chief Complaint  Patient presents with  . Code Stroke   (Consider location/radiation/quality/duration/timing/severity/associated sxs/prior Treatment) HPI Comments: Per EMS- pt had a physical on the 3rd that she passed. Pt got up to use the bathroom about two hours ago and husband noticed she never came back. He went into to check on her and she had fallen between the toilet and the wall and was moaning. Husband called EMS. Pt was noted to have some left sided deficits. PIV 18 G RAC placed. Unable to see out of left side. Husband reports that she had a cortisone shot due to shoulder pain about a week to 1-1/2 weeks ago and has been having some elevated blood sugars.  500 above for several days since.  He does not think that she has had recent fevers, chills or coughing.  Patient is a 80 y.o. female presenting with Acute Neurological Problem. The history is provided by the patient. The history is limited by the condition of the patient and the absence of a caregiver. No language interpreter was used.  Cerebrovascular Accident This is a new problem. Episode onset: intially reported about 2 hrs, but with husband's hx likely around 8 hrs. The problem occurs constantly. The problem has not changed since onset.Pertinent negatives include no chest pain, no abdominal pain, no headaches and no shortness of breath. Nothing aggravates the symptoms. Nothing relieves the symptoms. She has tried nothing for the symptoms. The treatment provided no relief.    Past Medical History  Diagnosis Date  . Transient ischemic attack   . HTN (hypertension)   . Hyperlipidemia   . DM (diabetes mellitus)   . GERD (gastroesophageal reflux disease)   . Heart disease     PFO  . Amnesia 01/10/2013  . Patent foramen ovale with atrial septal aneurysm   . Complication of anesthesia   . PONV (postoperative  nausea and vomiting)   . Family history of anesthesia complication     "My brother has trouble waking up."  . Glaucoma     Hx: of  . Early cataracts, bilateral     Hx: of  . Arthritis    Past Surgical History  Procedure Laterality Date  . US echocardiography  10-13-11    EF 60-65% normal  . Back surgery      disc   . Tonsillectomy    . Tee without cardioversion  10/30/2011    Procedure: TRANSESOPHAGEAL ECHOCARDIOGRAM (TEE);  Surgeon: Larey Dresser, MD;  Location: [facility];  Service: Cardiovascular;  Laterality: N/A;  . Colonoscopy w/ biopsies and polypectomy      HX;of  . Tonsillectomy    . Tubal ligation    . Lumbar laminectomy/decompression microdiscectomy N/A 09/11/2013    Procedure: LUMBAR LAMINECTOMY/DECOMPRESSION MICRODISCECTOMY 2 LEVELS;  Surgeon: Ophelia Charter, MD;  Location: [facility];  Service: Neurosurgery;  Laterality: N/A;  Lumbar Four-Five Laminectomy and Foraminotomy with redo Lumbar Five-Sacral One diskectomy   Family History  Problem Relation Age of Onset  . Stroke      No family history of such  . Congenital heart disease      No family history of such  . Anesthesia problems Neg Hx   . Diabetes Maternal Grandfather    History  Substance Use Topics  . Smoking status: Never Smoker   . Smokeless tobacco: Never Used  .  Alcohol Use: No   OB History    No data available     Review of Systems  Unable to perform ROS: Mental status change  Constitutional: Negative for fever, chills, diaphoresis, activity change, appetite change and fatigue.  HENT: Negative for congestion, facial swelling, rhinorrhea and sore throat.   Eyes: Negative for photophobia and discharge.  Respiratory: Negative for cough, chest tightness and shortness of breath.   Cardiovascular: Negative for chest pain, palpitations and leg swelling.  Gastrointestinal: Negative for nausea, vomiting, abdominal pain and diarrhea.  Endocrine: Negative for polydipsia and polyuria.   Genitourinary: Negative for dysuria, frequency, difficulty urinating and pelvic pain.  Musculoskeletal: Negative for back pain, arthralgias, neck pain and neck stiffness.  Skin: Negative for color change and wound.  Allergic/Immunologic: Negative for immunocompromised state.  Neurological: Positive for weakness. Negative for facial asymmetry, numbness and headaches.  Hematological: Does not bruise/bleed easily.  Psychiatric/Behavioral: Negative for confusion and agitation.      Allergies  Gabapentin and Codeine  Home Medications   Prior to Admission medications   Medication Sig Start Date End Date Taking? Authorizing Provider  aspirin EC 81 MG tablet Take 81 mg by mouth daily.   Yes Historical Provider, MD  brimonidine (ALPHAGAN) 0.2 % ophthalmic solution Place 1 drop into both eyes 2 (two) times daily.  11/18/12  Yes Historical Provider, MD  Calcium Citrate-Vitamin D 250-200 MG-UNIT TABS Take 1 tablet by mouth daily.   Yes Historical Provider, MD  Cholecalciferol (VITAMIN D3) 1000 UNITS CAPS Take 1,000 Units by mouth daily.   Yes Historical Provider, MD  esomeprazole (NEXIUM) 20 MG capsule Take 20 mg by mouth daily.    Yes Historical Provider, MD  ibuprofen (ADVIL,MOTRIN) 200 MG tablet Take 200 mg by mouth daily as needed for mild pain.    Yes Historical Provider, MD  insulin glargine (LANTUS) 100 UNIT/ML injection Inject 20 Units into the skin daily.    Yes Historical Provider, MD  insulin lispro (HUMALOG) 100 UNIT/ML injection Inject into the skin 3 (three) times daily before meals. Per sliding scale - average 15 units   Yes Historical Provider, MD  lisinopril-hydrochlorothiazide (PRINZIDE,ZESTORETIC) 20-25 MG per tablet Take 1 tablet by mouth daily.   Yes Historical Provider, MD  Magnesium 200 MG TABS Take 400 mg by mouth daily.   Yes Historical Provider, MD  metoCLOPramide (REGLAN) 5 MG tablet Take 5 mg by mouth 4 (four) times daily -  before meals and at bedtime.    Yes Historical  Provider, MD  Misc Natural Products (OSTEO BI-FLEX ADV JOINT SHIELD) TABS Take 1 tablet by mouth daily.   Yes Historical Provider, MD  Multiple Vitamin (MULITIVITAMIN WITH MINERALS) TABS Take 1 tablet by mouth daily.   Yes Historical Provider, MD  traMADol (ULTRAM) 50 MG tablet Take 50-100 mg by mouth 2 (two) times daily as needed for moderate pain.  11/20/13  Yes Historical Provider, MD  diazepam (VALIUM) 5 MG tablet Take 1 tablet (5 mg total) by mouth every 6 (six) hours as needed for muscle spasms. Patient not taking: Reported on 09/05/2014 09/13/13   Consuella Lose, MD  oxyCODONE-acetaminophen (PERCOCET/ROXICET) 5-325 MG per tablet Take 1-2 tablets by mouth every 4 (four) hours as needed for moderate pain. Patient not taking: Reported on 09/05/2014 09/13/13   Consuella Lose, MD   BP 152/60 mmHg  Pulse 85  Temp(Src) 98.5 F (36.9 C) (Oral)  Resp 20  Ht 5\' 4"  (1.626 m)  Wt 119 lb 11.4 oz (54.3 kg)  BMI 20.54 kg/m2  SpO2 99% Physical Exam  Constitutional: She appears well-developed and well-nourished. She appears lethargic. No distress.  HENT:  Head: Normocephalic and atraumatic.  Mouth/Throat: No oropharyngeal exudate.  Multiple abrasions to face  Eyes: Pupils are equal, round, and reactive to light.  Neck: Normal range of motion. Neck supple.  Cardiovascular: Normal rate, regular rhythm and normal heart sounds.  Exam reveals no gallop and no friction rub.   No murmur heard. Pulmonary/Chest: Effort normal and breath sounds normal. No respiratory distress. She has no wheezes. She has no rales.  Abdominal: Soft. Bowel sounds are normal. She exhibits no distension and no mass. There is no tenderness. There is no rebound and no guarding.  Musculoskeletal: Normal range of motion. She exhibits no edema or tenderness.  Neurological: She appears lethargic. She exhibits abnormal muscle tone. Coordination abnormal. GCS eye subscore is 4. GCS verbal subscore is 4. GCS motor subscore is 6.   Looking to the right, left-sided hemiparesis.  Skin: Skin is warm and dry.  Psychiatric: She has a normal mood and affect.    ED Course  Procedures (including critical care time) Labs Review Labs Reviewed  CBC - Abnormal; Notable for the following:    WBC 23.6 (*)    All other components within normal limits  DIFFERENTIAL - Abnormal; Notable for the following:    Neutrophils Relative % 83 (*)    Neutro Abs 19.4 (*)    Lymphocytes Relative 9 (*)    Monocytes Absolute 2.0 (*)    All other components within normal limits  COMPREHENSIVE METABOLIC PANEL - Abnormal; Notable for the following:    Sodium 129 (*)    Chloride 95 (*)    Glucose, Bld 518 (*)    BUN 27 (*)    GFR calc non Af Amer 52 (*)    GFR calc Af Amer 60 (*)    All other components within normal limits  URINALYSIS, ROUTINE W REFLEX MICROSCOPIC - Abnormal; Notable for the following:    Glucose, UA >1000 (*)    Ketones, ur 15 (*)    All other components within normal limits  TROPONIN I - Abnormal; Notable for the following:    Troponin I 0.18 (*)    All other components within normal limits  GLUCOSE, CAPILLARY - Abnormal; Notable for the following:    Glucose-Capillary 357 (*)    All other components within normal limits  I-STAT CHEM 8, ED - Abnormal; Notable for the following:    Sodium 130 (*)    BUN 32 (*)    Glucose, Bld 510 (*)    Hemoglobin 15.3 (*)    All other components within normal limits  I-STAT TROPOININ, ED - Abnormal; Notable for the following:    Troponin i, poc 0.14 (*)    All other components within normal limits  I-STAT ARTERIAL BLOOD GAS, ED - Abnormal; Notable for the following:    pCO2 arterial 33.6 (*)    pO2, Arterial 52.0 (*)    Bicarbonate 19.8 (*)    Acid-base deficit 5.0 (*)    All other components within normal limits  POCT I-STAT 7, (LYTES, BLD GAS, ICA,H+H) - Abnormal; Notable for the following:    pH, Arterial 7.333 (*)    pO2, Arterial 69.0 (*)    Bicarbonate 19.9 (*)     Acid-base deficit 5.0 (*)    Sodium 130 (*)    All other components within normal limits  MRSA PCR SCREENING  ETHANOL  PROTIME-INR  APTT  URINE RAPID DRUG SCREEN (HOSP PERFORMED)  URINE MICROSCOPIC-ADD ON  CK  BLOOD GAS, ARTERIAL  TROPONIN I  TROPONIN I  TROPONIN I  TRIGLYCERIDES  I-STAT TROPOININ, ED  I-STAT CHEM 8, ED  I-STAT TROPOININ, ED  I-STAT TROPOININ, ED    Imaging Review Ct Angio Head W/cm &/or Wo Cm  09/05/2014   CLINICAL DATA:  Left-sided paralysis.  Acute stroke.  EXAM: CT ANGIOGRAPHY HEAD AND NECK  CT CEREBRAL PERFUSION  TECHNIQUE: Multidetector CT imaging of the head and neck was performed using the standard protocol during bolus administration of intravenous contrast. Multiplanar CT image reconstructions and MIPs were obtained to evaluate the vascular anatomy. Carotid stenosis measurements (when applicable) are obtained utilizing NASCET criteria, using the distal internal carotid diameter as the denominator. CT cerebral perfusion was performed during bolus IV contrast injection.  CONTRAST:  157mL OMNIPAQUE IOHEXOL 350 MG/ML SOLN  COMPARISON:  Head CT earlier today. Head MRI/ MRA 01/27/2013. Neck MRA 10/13/2011.  FINDINGS: CTA NECK  Aortic arch: 3 vessel aortic arch. The brachiocephalic and subclavian arteries are patent without stenosis.  Right carotid system: Mild, predominantly noncalcified plaque is present at the right carotid bifurcation. No common carotid or cervical internal carotid artery stenosis.  Left carotid system: Mild calcified and noncalcified plaque at the carotid bifurcation without common carotid or cervical internal carotid artery stenosis.  Vertebral arteries:Vertebral arteries are patent without stenosis. Left vertebral artery is mildly dominant.  Skeleton: Moderate to severe multilevel cervical disc degeneration.  Other neck: The thyroid gland is heterogeneous and diffusely enlarged, more so in the right lobe. Multiple nodules are present. Dominant  right-sided nodule measures 3.0 cm. There is a 2.0 cm nodule in the isthmus.  CTA HEAD  Anterior circulation: Internal carotid arteries are patent from skullbase to carotid termini. There is mild carotid siphon calcification bilaterally without significant stenosis. There is a distal M1 occlusion on the right. Contrast is seen within M2 and more distal MCA branch vessels, likely via collateral flow, however the number of MCA branch vessels is mildly diminished compared to the contralateral side. Left MCA and bilateral ACAs are unremarkable. No intracranial aneurysm is identified.  Posterior circulation: The intracranial vertebral arteries are patent to the basilar with the left being dominant. PICA origins and SCA origins are patent. Basilar artery is patent without stenosis. There are patent posterior communicating arteries bilaterally, right larger than left, and the right P1 segment is mildly hypoplastic. PCAs are otherwise unremarkable.  Venous sinuses: Patent.  Anatomic variants: Hypoplastic right P1.  Delayed phase: No abnormal brain parenchymal or meningeal enhancement. Asymmetric enhancement of right MCA branch vessels may reflect slow flow and collateral flow. Subtle asymmetric hypoattenuation in the right corona radiata and external capsule may reflect developing acute infarct.  CT PERFUSION  Cerebral perfusion images demonstrate abnormally prolonged mean transit time and time to peak throughout essentially the entirety of the right MCA territory with corresponding decreased cerebral blood flow. Cerebral blood volume is maintained throughout much of this region, with some cortical/subcortical regions demonstrating slightly elevated CBV compared to the contralateral side. The deep white matter of the right corona radiata and centrum semiovale, particularly posteriorly, as well as posterior temporal lobe demonstrate more abnormal MTT and CBF parameters than the remainder of the MCA territory as well as  slightly diminished CBV compared to the contralateral side and may reflect a developing core infarct in a deep watershed distribution. The basal ganglia are largely spared.  IMPRESSION: 1. Distal right  M1 occlusion. 2. Abnormal perfusion throughout essentially the entirety of the right MCA territory. Much of this appears to represent oligemic brain with relatively preserved cerebral blood volume, however there is likely a developing region of core infarct involving the deep white matter.  Preliminary results were discussed in person at the time of interpretation on 09/05/2014 at 10:45 am with Dr. Dorian Pod , who verbally acknowledged these results.   Electronically Signed   By: Logan Bores   On: 09/05/2014 11:35   Ct Head Wo Contrast  09/05/2014   CLINICAL DATA:  Stroke. Left-sided weakness. Right MCA occlusion status post cerebral angiogram and revascularization.  EXAM: CT HEAD WITHOUT CONTRAST  TECHNIQUE: Contiguous axial images were obtained from the base of the skull through the vertex without intravenous contrast.  COMPARISON:  Head CT earlier today  FINDINGS: There is some residual intravascular contrast present from recent catheter angiogram. There is mild contrast staining in the right basal ganglia. Subtly asymmetric hypoattenuation in the posterior right centrum semiovale and corona radiata is unchanged and could reflect early acute ischemic change or asymmetric chronic ischemia. Patchy hypodensities more anteriorly in the centrum semiovale/ corona radiata bilaterally are also unchanged and appear more chronic in nature. No definite acute large territory cortical infarct is identified. There is no evidence of acute intracranial hemorrhage, mass, midline shift, or extra-axial fluid collection. Mild generalized cerebral atrophy is noted.  Orbits are unremarkable. Visualized mastoid air cells are clear. Left sphenoid sinus fluid has mildly increased. Endotracheal and enteric tubes are partially  visualized.  IMPRESSION: Sequelae of interval cerebral angiography and right MCA revascularization. No acute hemorrhage.   Electronically Signed   By: Logan Bores   On: 09/05/2014 14:41   Ct Head Wo Contrast  09/05/2014   CLINICAL DATA:  Left-sided weakness beginning earlier this morning. Post fall.  EXAM: CT HEAD WITHOUT CONTRAST  CT CERVICAL SPINE WITHOUT CONTRAST  TECHNIQUE: Multidetector CT imaging of the head and cervical spine was performed following the standard protocol without intravenous contrast. Multiplanar CT image reconstructions of the cervical spine were also generated.  COMPARISON:  Head CT - 12/18/2012; brain MRI - 01/27/2013; thyroid ultrasound - 08/17/2010  FINDINGS: CT HEAD FINDINGS  Examination is minimally degraded due to obliquity. Interval development of an ill-defined area of hypoattenuation within the bilateral centrum semiovale, right greater than left (representative images 18 through 20, series 201). These areas appear at least subacute given lack of adjacent vasogenic edema. The gray-white differentiation is otherwise well maintained. Similar findings of atrophy with bifrontal sulcal prominence. Unchanged size and configuration of the ventricles and basilar cisterns given obliquity. No midline shift. Intracranial atherosclerosis.  There is minimal mucosal thickening within the bilateral sphenoid sinuses. The remaining paranasal sinuses mastoid air cells are normally aerated. No air-fluid levels. Regional soft tissues appear normal. No displaced calvarial fracture.  CT CERVICAL SPINE FINDINGS  C1 to the superior endplate of T3 is imaged.  Normal alignment of the cervical spine given the head being held in a minimal amount of left lateral flexion. No anterolisthesis or retrolisthesis. The bilateral facets are normally aligned. The dens is normally positioned between the lateral masses of C1. Moderate degenerative change of the atlantodental articulation  Cervical vertebral body  heights are preserved. Prevertebral soft tissues are normal.  There is moderate to severe multilevel cervical spine DDD, worse at C4-C5, C5-C6 and C6-C7 with disc space height loss, endplate irregularity and posteriorly directed disc osteophyte complexes at these locations.  There is asymmetric enlargement  of the right lobe of the thyroid and the thyroid isthmus with note made of an approximately 1.1 cm hyper attenuating nodule within the posterior aspect the right lobe of the thyroid (image 65, series 302) as well as an approximately 2.0 x 1.8 cm nodule within the thyroid isthmus (image 78).  Regional soft tissues appear otherwise normal. Limited visualization of lung apices is normal.  IMPRESSION: 1. Interval development of ill-defined hypo densities within the bilateral centrum semiovale, right greater than left, favored to represent progressive age-indeterminate though at least subacute microvascular ischemic disease continued since remote examination performed 11/2012. Otherwise, no definite acute intracranial process. 2. No fracture or static subluxation of the cervical spine. 3. Moderate to severe multilevel cervical spine DDD, worse at C4-C5, C5-C6 and C6-C7. 4. Asymmetric enlargement of the right lobe of the thyroid and thyroid isthmus with suspected thyroid nodules as detailed above. While these nodules appear grossly similar to compared to the 07/2010 thyroid ultrasound, further evaluation could be performed with repeat dedicated non emergent thyroid ultrasound as clinically indicated. Above findings discussed with Dr. Aram Beecham at 02/2012.   Electronically Signed   By: Sandi Mariscal M.D.   On: 09/05/2014 08:39   Ct Angio Neck W/cm &/or Wo/cm  09/05/2014   CLINICAL DATA:  Left-sided paralysis.  Acute stroke.  EXAM: CT ANGIOGRAPHY HEAD AND NECK  CT CEREBRAL PERFUSION  TECHNIQUE: Multidetector CT imaging of the head and neck was performed using the standard protocol during bolus administration of intravenous  contrast. Multiplanar CT image reconstructions and MIPs were obtained to evaluate the vascular anatomy. Carotid stenosis measurements (when applicable) are obtained utilizing NASCET criteria, using the distal internal carotid diameter as the denominator. CT cerebral perfusion was performed during bolus IV contrast injection.  CONTRAST:  128mL OMNIPAQUE IOHEXOL 350 MG/ML SOLN  COMPARISON:  Head CT earlier today. Head MRI/ MRA 01/27/2013. Neck MRA 10/13/2011.  FINDINGS: CTA NECK  Aortic arch: 3 vessel aortic arch. The brachiocephalic and subclavian arteries are patent without stenosis.  Right carotid system: Mild, predominantly noncalcified plaque is present at the right carotid bifurcation. No common carotid or cervical internal carotid artery stenosis.  Left carotid system: Mild calcified and noncalcified plaque at the carotid bifurcation without common carotid or cervical internal carotid artery stenosis.  Vertebral arteries:Vertebral arteries are patent without stenosis. Left vertebral artery is mildly dominant.  Skeleton: Moderate to severe multilevel cervical disc degeneration.  Other neck: The thyroid gland is heterogeneous and diffusely enlarged, more so in the right lobe. Multiple nodules are present. Dominant right-sided nodule measures 3.0 cm. There is a 2.0 cm nodule in the isthmus.  CTA HEAD  Anterior circulation: Internal carotid arteries are patent from skullbase to carotid termini. There is mild carotid siphon calcification bilaterally without significant stenosis. There is a distal M1 occlusion on the right. Contrast is seen within M2 and more distal MCA branch vessels, likely via collateral flow, however the number of MCA branch vessels is mildly diminished compared to the contralateral side. Left MCA and bilateral ACAs are unremarkable. No intracranial aneurysm is identified.  Posterior circulation: The intracranial vertebral arteries are patent to the basilar with the left being dominant. PICA  origins and SCA origins are patent. Basilar artery is patent without stenosis. There are patent posterior communicating arteries bilaterally, right larger than left, and the right P1 segment is mildly hypoplastic. PCAs are otherwise unremarkable.  Venous sinuses: Patent.  Anatomic variants: Hypoplastic right P1.  Delayed phase: No abnormal brain parenchymal or meningeal enhancement. Asymmetric  enhancement of right MCA branch vessels may reflect slow flow and collateral flow. Subtle asymmetric hypoattenuation in the right corona radiata and external capsule may reflect developing acute infarct.  CT PERFUSION  Cerebral perfusion images demonstrate abnormally prolonged mean transit time and time to peak throughout essentially the entirety of the right MCA territory with corresponding decreased cerebral blood flow. Cerebral blood volume is maintained throughout much of this region, with some cortical/subcortical regions demonstrating slightly elevated CBV compared to the contralateral side. The deep white matter of the right corona radiata and centrum semiovale, particularly posteriorly, as well as posterior temporal lobe demonstrate more abnormal MTT and CBF parameters than the remainder of the MCA territory as well as slightly diminished CBV compared to the contralateral side and may reflect a developing core infarct in a deep watershed distribution. The basal ganglia are largely spared.  IMPRESSION: 1. Distal right M1 occlusion. 2. Abnormal perfusion throughout essentially the entirety of the right MCA territory. Much of this appears to represent oligemic brain with relatively preserved cerebral blood volume, however there is likely a developing region of core infarct involving the deep white matter.  Preliminary results were discussed in person at the time of interpretation on 09/05/2014 at 10:45 am with Dr. Dorian Pod , who verbally acknowledged these results.   Electronically Signed   By: Logan Bores   On:  09/05/2014 11:35   Ct Cervical Spine Wo Contrast  09/05/2014   CLINICAL DATA:  Left-sided weakness beginning earlier this morning. Post fall.  EXAM: CT HEAD WITHOUT CONTRAST  CT CERVICAL SPINE WITHOUT CONTRAST  TECHNIQUE: Multidetector CT imaging of the head and cervical spine was performed following the standard protocol without intravenous contrast. Multiplanar CT image reconstructions of the cervical spine were also generated.  COMPARISON:  Head CT - 12/18/2012; brain MRI - 01/27/2013; thyroid ultrasound - 08/17/2010  FINDINGS: CT HEAD FINDINGS  Examination is minimally degraded due to obliquity. Interval development of an ill-defined area of hypoattenuation within the bilateral centrum semiovale, right greater than left (representative images 18 through 20, series 201). These areas appear at least subacute given lack of adjacent vasogenic edema. The gray-white differentiation is otherwise well maintained. Similar findings of atrophy with bifrontal sulcal prominence. Unchanged size and configuration of the ventricles and basilar cisterns given obliquity. No midline shift. Intracranial atherosclerosis.  There is minimal mucosal thickening within the bilateral sphenoid sinuses. The remaining paranasal sinuses mastoid air cells are normally aerated. No air-fluid levels. Regional soft tissues appear normal. No displaced calvarial fracture.  CT CERVICAL SPINE FINDINGS  C1 to the superior endplate of T3 is imaged.  Normal alignment of the cervical spine given the head being held in a minimal amount of left lateral flexion. No anterolisthesis or retrolisthesis. The bilateral facets are normally aligned. The dens is normally positioned between the lateral masses of C1. Moderate degenerative change of the atlantodental articulation  Cervical vertebral body heights are preserved. Prevertebral soft tissues are normal.  There is moderate to severe multilevel cervical spine DDD, worse at C4-C5, C5-C6 and C6-C7 with disc  space height loss, endplate irregularity and posteriorly directed disc osteophyte complexes at these locations.  There is asymmetric enlargement of the right lobe of the thyroid and the thyroid isthmus with note made of an approximately 1.1 cm hyper attenuating nodule within the posterior aspect the right lobe of the thyroid (image 65, series 302) as well as an approximately 2.0 x 1.8 cm nodule within the thyroid isthmus (image 78).  Regional soft  tissues appear otherwise normal. Limited visualization of lung apices is normal.  IMPRESSION: 1. Interval development of ill-defined hypo densities within the bilateral centrum semiovale, right greater than left, favored to represent progressive age-indeterminate though at least subacute microvascular ischemic disease continued since remote examination performed 11/2012. Otherwise, no definite acute intracranial process. 2. No fracture or static subluxation of the cervical spine. 3. Moderate to severe multilevel cervical spine DDD, worse at C4-C5, C5-C6 and C6-C7. 4. Asymmetric enlargement of the right lobe of the thyroid and thyroid isthmus with suspected thyroid nodules as detailed above. While these nodules appear grossly similar to compared to the 07/2010 thyroid ultrasound, further evaluation could be performed with repeat dedicated non emergent thyroid ultrasound as clinically indicated. Above findings discussed with Dr. Aram Beecham at 02/2012.   Electronically Signed   By: Sandi Mariscal M.D.   On: 09/05/2014 08:39   Dg Pelvis Portable  09/05/2014   CLINICAL DATA:  Fall with right hip pain.  EXAM: PORTABLE PELVIS 1-2 VIEWS  COMPARISON:  12/24/2012  FINDINGS: Note that the very lateral aspect of the right trochanteric region is not imaged. There are mild symmetric degenerative changes of the hips unchanged. No evidence of acute fracture or dislocation. There are degenerative changes of the spine. There is mild fecal retention over the rectum.  IMPRESSION: No acute  fracture or dislocation. Note that the lateral aspect of the trochanteric region of the right femur is not completely imaged. Recommend an additional cone-down view of the right hip for complete evaluation.   Electronically Signed   By: Marin Olp M.D.   On: 09/05/2014 10:57   Ct Cerebral Perfusion W/cm  09/05/2014   CLINICAL DATA:  Left-sided paralysis.  Acute stroke.  EXAM: CT ANGIOGRAPHY HEAD AND NECK  CT CEREBRAL PERFUSION  TECHNIQUE: Multidetector CT imaging of the head and neck was performed using the standard protocol during bolus administration of intravenous contrast. Multiplanar CT image reconstructions and MIPs were obtained to evaluate the vascular anatomy. Carotid stenosis measurements (when applicable) are obtained utilizing NASCET criteria, using the distal internal carotid diameter as the denominator. CT cerebral perfusion was performed during bolus IV contrast injection.  CONTRAST:  130mL OMNIPAQUE IOHEXOL 350 MG/ML SOLN  COMPARISON:  Head CT earlier today. Head MRI/ MRA 01/27/2013. Neck MRA 10/13/2011.  FINDINGS: CTA NECK  Aortic arch: 3 vessel aortic arch. The brachiocephalic and subclavian arteries are patent without stenosis.  Right carotid system: Mild, predominantly noncalcified plaque is present at the right carotid bifurcation. No common carotid or cervical internal carotid artery stenosis.  Left carotid system: Mild calcified and noncalcified plaque at the carotid bifurcation without common carotid or cervical internal carotid artery stenosis.  Vertebral arteries:Vertebral arteries are patent without stenosis. Left vertebral artery is mildly dominant.  Skeleton: Moderate to severe multilevel cervical disc degeneration.  Other neck: The thyroid gland is heterogeneous and diffusely enlarged, more so in the right lobe. Multiple nodules are present. Dominant right-sided nodule measures 3.0 cm. There is a 2.0 cm nodule in the isthmus.  CTA HEAD  Anterior circulation: Internal carotid  arteries are patent from skullbase to carotid termini. There is mild carotid siphon calcification bilaterally without significant stenosis. There is a distal M1 occlusion on the right. Contrast is seen within M2 and more distal MCA branch vessels, likely via collateral flow, however the number of MCA branch vessels is mildly diminished compared to the contralateral side. Left MCA and bilateral ACAs are unremarkable. No intracranial aneurysm is identified.  Posterior circulation: The  intracranial vertebral arteries are patent to the basilar with the left being dominant. PICA origins and SCA origins are patent. Basilar artery is patent without stenosis. There are patent posterior communicating arteries bilaterally, right larger than left, and the right P1 segment is mildly hypoplastic. PCAs are otherwise unremarkable.  Venous sinuses: Patent.  Anatomic variants: Hypoplastic right P1.  Delayed phase: No abnormal brain parenchymal or meningeal enhancement. Asymmetric enhancement of right MCA branch vessels may reflect slow flow and collateral flow. Subtle asymmetric hypoattenuation in the right corona radiata and external capsule may reflect developing acute infarct.  CT PERFUSION  Cerebral perfusion images demonstrate abnormally prolonged mean transit time and time to peak throughout essentially the entirety of the right MCA territory with corresponding decreased cerebral blood flow. Cerebral blood volume is maintained throughout much of this region, with some cortical/subcortical regions demonstrating slightly elevated CBV compared to the contralateral side. The deep white matter of the right corona radiata and centrum semiovale, particularly posteriorly, as well as posterior temporal lobe demonstrate more abnormal MTT and CBF parameters than the remainder of the MCA territory as well as slightly diminished CBV compared to the contralateral side and may reflect a developing core infarct in a deep watershed  distribution. The basal ganglia are largely spared.  IMPRESSION: 1. Distal right M1 occlusion. 2. Abnormal perfusion throughout essentially the entirety of the right MCA territory. Much of this appears to represent oligemic brain with relatively preserved cerebral blood volume, however there is likely a developing region of core infarct involving the deep white matter.  Preliminary results were discussed in person at the time of interpretation on 09/05/2014 at 10:45 am with Dr. Dorian Pod , who verbally acknowledged these results.   Electronically Signed   By: Logan Bores   On: 09/05/2014 11:35   Dg Chest Port 1 View  09/05/2014   CLINICAL DATA:  Code stroke. History of TIA, hypertension and diabetes.  EXAM: PORTABLE CHEST - 1 VIEW  COMPARISON:  08/14/2013; 10/13/2011  FINDINGS: Grossly unchanged cardiac silhouette and mediastinal contours. No focal parenchymal opacities. No pleural effusion or pneumothorax. No evidence of edema. No acute osseus abnormalities.  IMPRESSION: No acute cardiopulmonary disease.   Electronically Signed   By: Sandi Mariscal M.D.   On: 09/05/2014 08:53   Dg Hip Unilat With Pelvis 2-3 Views Right  09/05/2014   CLINICAL DATA:  80 year old female with right hip pain. Patient fell in bathroom earlier today.  EXAM: RIGHT HIP (WITH PELVIS) 2-3 VIEWS  COMPARISON:  None.  FINDINGS: No evidence of acute fracture or malalignment. The femoral head is located. The bones appear osteopenic. Foley catheter present within the bladder.  IMPRESSION: No evidence of acute fracture or malalignment   Electronically Signed   By: Jacqulynn Cadet M.D.   On: 09/05/2014 12:05     EKG Interpretation   Date/Time:  Saturday September 05 2014 07:48:42 EST Ventricular Rate:  89 PR Interval:  153 QRS Duration: 102 QT Interval:  376 QTC Calculation: 457 R Axis:   34 Text Interpretation:  Sinus rhythm Abnormal R-wave progression, early  transition Confirmed by Karington Zarazua  MD, Hennessey Cantrell ([id number]) on 09/05/2014 8:11:02  AM     CRITICAL CARE Performed by: Ernestina Patches, E Total critical care time: 30 Critical care time was exclusive of separately billable procedures and treating other patients. Critical care was necessary to treat or prevent imminent or life-threatening deterioration. Critical care was time spent personally by me on the following activities: development of treatment plan with  patient and/or surrogate as well as nursing, discussions with consultants, evaluation of patient's response to treatment, examination of patient, obtaining history from patient or surrogate, ordering and performing treatments and interventions, ordering and review of laboratory studies, ordering and review of radiographic studies, pulse oximetry and re-evaluation of patient's condition.  MDM   Final diagnoses:  Hypoxia  Stroke, acute, embolic  Fall  Right hip pain   Pt is a 80 y.o. female with Pmhx as above who presents as code CVA.  Initial report was that patient was last seen normal about 2 hours ago when she got up to go the bathroom in the middle of the night and had apparently fallen.  Husband went to find her and saw that she had fallen on her side between the toilet and the wall and was initially unresponsive.  EMS on the scene reported that she has a dense left-sided hemiparesis.  On initial exam.  She is GCS 14 with left-sided weakness.  She is also hypoxic.  The lungs are fairly clear.  She is abrasions on her face.  Patient sent to CT, which showed no acute findings.  Husband arrived for further history and it appears that she was last seen normal work closely to 8 hours ago.  Neurology will take patient for a perfusion study.  She incidentally has an elevated troponin without ischemic acute ischemic changes on EKG, as well as an elevated blood sugar.  Husband reports that she had a cortisone shot about a week ago and has had several days of high blood sugars in the mornings.  It sometimes greater than hand-held  reader upper limit.  She has not had any fevers, chills, coughing, trouble breathing or chest pain that the husband is aware of.   repeat examinations, her mental status is improving and I do not feel that she requires intubation at this point.  She remains hypoxic, although chest x-ray is clear.  Patient complaining of right hip pain, although x-ray show no evidence of acute fracture.  Patient had a CT perfusion study which showed a large penumbra and subsequently underwent IR, percutaneous from back to me and was admitted to the neuro ICU.     Ernestina Patches, MD 09/05/14 [phone]

## 2014-09-05 NOTE — ED Notes (Signed)
Abnormal labs given to RN Vicente Males

## 2014-09-05 NOTE — ED Notes (Signed)
Activated CODE Stroke

## 2014-09-05 NOTE — Progress Notes (Signed)
Procedure end time: 8592.

## 2014-09-05 NOTE — Procedures (Signed)
S/P rt vert artery angiogram and RT common carotid areriogram,followed by complete revascularization of occluded RT MCA prox with x1 pass with the Solitaire FR 64mm x 40 mm retrieval device and 5 mg of superselective INTEGRELIN. TICI 3 flow restored

## 2014-09-05 NOTE — Progress Notes (Signed)
   Chaplain checked in on husband of Code STROKE.  Provided emotional support.  Provided prayer with husband and patient. Please call as needed.    Luana Shu [phone]    09/05/14 0900  Clinical Encounter Type  Visited With Patient and family together  Visit Type Spiritual support;Initial

## 2014-09-06 ENCOUNTER — Inpatient Hospital Stay (HOSPITAL_COMMUNITY): Payer: Medicare Other

## 2014-09-06 DIAGNOSIS — G459 Transient cerebral ischemic attack, unspecified: Secondary | ICD-10-CM

## 2014-09-06 DIAGNOSIS — I639 Cerebral infarction, unspecified: Secondary | ICD-10-CM

## 2014-09-06 LAB — CBC WITH DIFFERENTIAL/PLATELET
Basophils Absolute: 0 10*3/uL (ref 0.0–0.1)
Basophils Relative: 0 % (ref 0–1)
EOS ABS: 0 10*3/uL (ref 0.0–0.7)
Eosinophils Relative: 0 % (ref 0–5)
HCT: 30 % — ABNORMAL LOW (ref 36.0–46.0)
HEMOGLOBIN: 10.4 g/dL — AB (ref 12.0–15.0)
LYMPHS PCT: 18 % (ref 12–46)
Lymphs Abs: 2.6 10*3/uL (ref 0.7–4.0)
MCH: 28.8 pg (ref 26.0–34.0)
MCHC: 34.7 g/dL (ref 30.0–36.0)
MCV: 83.1 fL (ref 78.0–100.0)
Monocytes Absolute: 1.5 10*3/uL — ABNORMAL HIGH (ref 0.1–1.0)
Monocytes Relative: 10 % (ref 3–12)
Neutro Abs: 10.5 10*3/uL — ABNORMAL HIGH (ref 1.7–7.7)
Neutrophils Relative %: 72 % (ref 43–77)
PLATELETS: 130 10*3/uL — AB (ref 150–400)
RBC: 3.61 MIL/uL — ABNORMAL LOW (ref 3.87–5.11)
RDW: 13.2 % (ref 11.5–15.5)
WBC: 14.6 10*3/uL — AB (ref 4.0–10.5)

## 2014-09-06 LAB — GLUCOSE, CAPILLARY
GLUCOSE-CAPILLARY: 104 mg/dL — AB (ref 70–99)
GLUCOSE-CAPILLARY: 111 mg/dL — AB (ref 70–99)
GLUCOSE-CAPILLARY: 133 mg/dL — AB (ref 70–99)
GLUCOSE-CAPILLARY: 146 mg/dL — AB (ref 70–99)
GLUCOSE-CAPILLARY: 172 mg/dL — AB (ref 70–99)
GLUCOSE-CAPILLARY: 281 mg/dL — AB (ref 70–99)
GLUCOSE-CAPILLARY: 386 mg/dL — AB (ref 70–99)
Glucose-Capillary: 110 mg/dL — ABNORMAL HIGH (ref 70–99)
Glucose-Capillary: 118 mg/dL — ABNORMAL HIGH (ref 70–99)
Glucose-Capillary: 121 mg/dL — ABNORMAL HIGH (ref 70–99)
Glucose-Capillary: 149 mg/dL — ABNORMAL HIGH (ref 70–99)
Glucose-Capillary: 205 mg/dL — ABNORMAL HIGH (ref 70–99)
Glucose-Capillary: 123 mg/dL — ABNORMAL HIGH (ref 70–99)
Glucose-Capillary: 55 mg/dL — ABNORMAL LOW (ref 70–99)

## 2014-09-06 LAB — BLOOD GAS, ARTERIAL
Acid-base deficit: 2.8 mmol/L — ABNORMAL HIGH (ref 0.0–2.0)
BICARBONATE: 20.3 meq/L (ref 20.0–24.0)
DRAWN BY: 40530
FIO2: 0.4 %
MECHVT: 500 mL
O2 Saturation: 97.6 %
PEEP: 5 cmH2O
PO2 ART: 93.7 mmHg (ref 80.0–100.0)
Patient temperature: 98.6
RATE: 20 resp/min
TCO2: 21.2 mmol/L (ref 0–100)
pCO2 arterial: 28.3 mmHg — ABNORMAL LOW (ref 35.0–45.0)
pH, Arterial: 7.47 — ABNORMAL HIGH (ref 7.350–7.450)

## 2014-09-06 LAB — LIPID PANEL
CHOLESTEROL: 180 mg/dL (ref 0–200)
HDL: 52 mg/dL (ref >39)
LDL Cholesterol: 101 mg/dL — ABNORMAL HIGH (ref 0–99)
TRIGLYCERIDES: 135 mg/dL (ref <150)
Total CHOL/HDL Ratio: 3.5 RATIO
VLDL: 27 mg/dL (ref 0–40)

## 2014-09-06 LAB — TROPONIN I: Troponin I: 1.5 ng/mL (ref <0.031)

## 2014-09-06 LAB — BASIC METABOLIC PANEL
Anion gap: 7 (ref 5–15)
BUN: 20 mg/dL (ref 6–23)
CALCIUM: 8.2 mg/dL — AB (ref 8.4–10.5)
CO2: 24 mmol/L (ref 19–32)
Chloride: 106 mmol/L (ref 96–112)
Creatinine, Ser: 0.84 mg/dL (ref 0.50–1.10)
GFR calc Af Amer: 76 mL/min — ABNORMAL LOW (ref >90)
GFR, EST NON AFRICAN AMERICAN: 65 mL/min — AB (ref >90)
Glucose, Bld: 129 mg/dL — ABNORMAL HIGH (ref 70–99)
POTASSIUM: 3.5 mmol/L (ref 3.5–5.1)
SODIUM: 137 mmol/L (ref 135–145)

## 2014-09-06 MED ORDER — INSULIN GLARGINE 100 UNIT/ML ~~LOC~~ SOLN
10.0000 [IU] | Freq: Two times a day (BID) | SUBCUTANEOUS | Status: DC
Start: 2014-09-06 — End: 2014-09-06
  Administered 2014-09-06 (×2): 10 [IU] via SUBCUTANEOUS
  Filled 2014-09-06 (×4): qty 0.1

## 2014-09-06 MED ORDER — POTASSIUM CHLORIDE IN NACL 20-0.9 MEQ/L-% IV SOLN
INTRAVENOUS | Status: DC
  Administered 2014-09-06 – 2014-09-10 (×6): via INTRAVENOUS
  Filled 2014-09-06 (×8): qty 1000

## 2014-09-06 MED ORDER — PHENOL 1.4 % MT LIQD
1.0000 | OROMUCOSAL | Status: DC | PRN
  Administered 2014-09-06 – 2014-09-09 (×5): 1 via OROMUCOSAL
  Filled 2014-09-06: qty 177

## 2014-09-06 MED ORDER — INSULIN ASPART 100 UNIT/ML ~~LOC~~ SOLN
0.0000 [IU] | SUBCUTANEOUS | Status: DC
  Administered 2014-09-06: 3 [IU] via SUBCUTANEOUS
  Administered 2014-09-06 – 2014-09-08 (×4): 2 [IU] via SUBCUTANEOUS
  Administered 2014-09-10 (×2): 3 [IU] via SUBCUTANEOUS

## 2014-09-06 MED ORDER — FENTANYL CITRATE 0.05 MG/ML IJ SOLN
12.5000 ug | INTRAMUSCULAR | Status: DC | PRN
  Administered 2014-09-06 – 2014-09-07 (×6): 25 ug via INTRAVENOUS
  Administered 2014-09-07: 12.5 ug via INTRAVENOUS
  Administered 2014-09-07 – 2014-09-08 (×7): 25 ug via INTRAVENOUS
  Administered 2014-09-08: 12.5 ug via INTRAVENOUS
  Administered 2014-09-08: 25 ug via INTRAVENOUS
  Filled 2014-09-06 (×17): qty 2

## 2014-09-06 NOTE — Progress Notes (Signed)
PT Cancellation Note  Patient Details Name: Christy Simmons MRN: [id number] DOB: 06-04-1935   Cancelled Treatment:    Reason Eval/Treat Not Completed: Patient not medically ready - pt remains intubated at this time.  PT will follow, check back next date and initiate mobility evaluation once medically appropriate.  Thank you,    Herbie Drape 09/06/2014, 9:58 AM

## 2014-09-06 NOTE — Progress Notes (Signed)
STROKE TEAM PROGRESS NOTE   HISTORY Christy Simmons is a 80 y.o. female with a past medical history significant for HTN, hyperlipidemia, DM, TIA, PFO with atrial septal aneurysm, brought in via EMS as a code stroke due to acute onset of left hemiparesis, decreased responsiveness. Patient was last known well around 8 pm last night. Husband stated that she hasn't been feeling well in the last month, and this morning he found her in the bathroom around 6 or 6:30 unable to move the left side and not fully responsive. He thinks that she got up to to the bathroom probably around 4 or 4:30 am. Upon arrival to the ED she was able to answer simple questions, and had NIHSS 16. CT brain showed an area of well defined area of hypodensity around the right basal ganglia.  Date last known well: 09/04/14 Time last known well: 8 pm tPA Given: no, out of the window for IV tpa, but patient taken to the IR suite for endovascular treatment. NIHSS: 16    SUBJECTIVE (INTERVAL HISTORY) Patient remains sedated and intubated. She apparently has been able to follow commands and overnight has been able to move the left side. Blood pressure has been adequately controlled. Arterial groin sheath is being removed at present.   OBJECTIVE Temp:  [97.1 F (36.2 C)-99.4 F (37.4 C)] 99.4 F (37.4 C) (02/07 0742) Pulse Rate:  [67-105] 73 (02/07 0830) Cardiac Rhythm:  [-] Normal sinus rhythm (02/06 2015) Resp:  [12-21] 16 (02/07 0830) BP: (68-167)/(42-94) 121/51 mmHg (02/07 0830) SpO2:  [96 %-100 %] 100 % (02/07 0830) Arterial Line BP: (85-175)/(39-86) 165/56 mmHg (02/07 0830) FiO2 (%):  [1 %-100 %] 40 % (02/07 0700) Weight:  [54.3 kg (119 lb 11.4 oz)-55.5 kg (122 lb 5.7 oz)] 55.5 kg (122 lb 5.7 oz) (02/07 0400)   Recent Labs Lab 09/06/14 0132 09/06/14 0231 09/06/14 0329 09/06/14 0415 09/06/14 0750  GLUCAP 121* 110* 133* 172* 104*    Recent Labs Lab 09/05/14 0744 09/05/14 0755 09/05/14 1302 09/06/14 0300  NA  129* 130* 130* 137  K 4.4 4.5 3.8 3.5  CL 95* 97  --  106  CO2 22  --   --  24  GLUCOSE 518* 510*  --  129*  BUN 27* 32*  --  20  CREATININE 1.02 0.80  --  0.84  CALCIUM 9.0  --   --  8.2*    Recent Labs Lab 09/05/14 0744  AST 30  ALT 23  ALKPHOS 71  BILITOT 0.5  PROT 6.3  ALBUMIN 3.6    Recent Labs Lab 09/05/14 0744 09/05/14 0755 09/05/14 1302 09/06/14 0300  WBC 23.6*  --   --  14.6*  NEUTROABS 19.4*  --   --  10.5*  HGB 13.6 15.3* 12.2 10.4*  HCT 38.8 45.0 36.0 30.0*  MCV 84.0  --   --  83.1  PLT 154  --   --  130*    Recent Labs Lab 09/05/14 0749 09/05/14 1508 09/05/14 2055 09/06/14 0300  CKTOTAL 89  --   --   --   TROPONINI 0.18* 1.15* 1.34* 1.50*    Recent Labs  09/05/14 0744  LABPROT 14.0  INR 1.07    Recent Labs  09/05/14 0820  COLORURINE YELLOW  LABSPEC 1.023  PHURINE 6.0  GLUCOSEU >1000*  HGBUR NEGATIVE  BILIRUBINUR NEGATIVE  KETONESUR 15*  PROTEINUR NEGATIVE  UROBILINOGEN 0.2  NITRITE NEGATIVE  LEUKOCYTESUR NEGATIVE       Component Value  Date/Time   CHOL 180 09/06/2014 0300   TRIG 135 09/06/2014 0300   HDL 52 09/06/2014 0300   CHOLHDL 3.5 09/06/2014 0300   VLDL 27 09/06/2014 0300   LDLCALC 101* 09/06/2014 0300   No results found for: HGBA1C    Component Value Date/Time   LABOPIA NONE DETECTED 09/05/2014 0820   COCAINSCRNUR NONE DETECTED 09/05/2014 0820   LABBENZ NONE DETECTED 09/05/2014 0820   AMPHETMU NONE DETECTED 09/05/2014 0820   THCU NONE DETECTED 09/05/2014 0820   LABBARB NONE DETECTED 09/05/2014 0820     Recent Labs Lab 09/05/14 0744  ETH <5    Ct Angio Head and Neck W/cm &/or Wo Cm 09/05/2014    1. Distal right M1 occlusion.  2. Abnormal perfusion throughout essentially the entirety of the right MCA territory. Much of this appears to represent oligemic brain with relatively preserved cerebral blood volume, however there is likely a developing region of core infarct involving the deep white matter.       Ct Head Wo Contrast 09/05/2014    Sequelae of interval cerebral angiography and right MCA revascularization. No acute hemorrhage.        Ct Head and cervical spine Wo Contrast 09/05/2014    1. Interval development of ill-defined hypo densities within the bilateral centrum semiovale, right greater than left, favored to represent progressive age-indeterminate though at least subacute microvascular ischemic disease continued since remote examination performed 11/2012. Otherwise, no definite acute intracranial process.  2. No fracture or static subluxation of the cervical spine.  3. Moderate to severe multilevel cervical spine DDD, worse at C4-C5, C5-C6 and C6-C7.  4. Asymmetric enlargement of the right lobe of the thyroid and thyroid isthmus with suspected thyroid nodules as detailed above. While these nodules appear grossly similar to compared to the 07/2010 thyroid ultrasound, further evaluation could be performed with repeat dedicated non emergent thyroid ultrasound as clinically indicated.     Dg Pelvis Portable 09/05/2014    No acute fracture or dislocation.  Note that the lateral aspect of the trochanteric region of the right femur is not completely imaged. Recommend an additional cone-down view of the right hip for complete evaluation.      Dg Hip Unilat With Pelvis 2-3 Views Right 09/05/2014    No evidence of acute fracture or malalignment        Ct Cerebral Perfusion W/cm 09/05/2014    1. Distal right M1 occlusion.  2. Abnormal perfusion throughout essentially the entirety of the right MCA territory. Much of this appears to represent oligemic brain with relatively preserved cerebral blood volume, however there is likely a developing region of core infarct involving the deep white matter.     Dg Chest Port 1 View 09/05/2014    No acute cardiopulmonary disease.      Dg Chest Port 1v Same Day 09/05/2014    No acute findings.     Interventional Radiology  Procedure 09/05/2014 S/P rt vert artery angiogram and RT common carotid areriogram,followed by complete revascularization of occluded RT MCA prox with x1 pass with the Solitaire FR 4mm x 40 mm retrieval device and 5 mg of superselective INTEGRELIN. TICI 3 flow restored    PHYSICAL EXAM Frail elderly Caucasian lady. Intubated. Sedated. Rt groin femoral sheath. . Afebrile. Head is nontraumatic. Neck is supple without bruit.    Cardiac exam no murmur or gallop. Lungs are clear to auscultation. Distal pulses are well felt. Neurological Exam : Patient is intubated and sedated on propofol drip. She is  drowsy but can be aroused and opens eyes partially. She follows midline commands and simple one-step commands in all 4 extremities. Slight right gaze preference but able to look to the left past midline. Pupils equal reactive. Fundi were not visualized. Left lower facial weakness. Tongue midline. Left hemiparesis but able to move left upper extremity against gravity. Mild withdrawal of the left lower extremity. Good antigravity movement in the right approximately. Right lower extremity exam limited due to groin arterial sheath. Right plantar is equivocal left upgoing. Sensation and coordination and gait cannot be tested. Reliably.     ASSESSMENT/PLAN Ms. Christy Simmons is a 80 y.o. female with history of HTN, hyperlipidemia, DM, TIA, PFO with atrial septal aneurysm, presenting with left hemiparesis, decreased responsiveness. She did not receive IV t-PA - out of the window for IV tpa, but patient taken to the IR suite for endovascular treatment..   Stroke:  Non-dominant. Right hemisphere from right MCA occlusion status post revascularization with 5 mg intra-arterial Integrilin and solitaire device  Resultant  left hemiparesis.  MRI  pending  MRA    Carotid Doppler - See CT angiogram of the neck.  2D Echo pending  LDL 101  HgbA1c pending  SCDs for VTE prophylaxis  Diet NPO time specified no  liquids  aspirin 81 mg orally every day prior to admission, now on no antithrombotic S/P ReoPro and Integrilin.  Ongoing aggressive stroke risk factor management  Therapy recommendations:  Pending  Disposition:  Pending  Hypertension  Home meds: Lisinopril-hydrochlorothiazide  BP low at times.   Hyperlipidemia  Home meds: No lipid lowering medications prior to admission.  LDL 101, goal < 70  Add statin once swallowing has been cleared.  Continue statin at discharge  Diabetes  HgbA1c pending, goal < 7.0  Uncontrolled  Other Stroke Risk Factors  Advanced age  Hx TIA  PFO- check LE venous dopplers for DVT   Other Active Problems  Anemia  Leukocytosis  Intubated  Elevated troponin levels  Other Pertinent History    Hospital day # [place] PA-C [facility] Pager [phone] 09/06/2014, 8:36 AM I have personally examined this patient, reviewed notes, independently viewed imaging studies, participated in medical decision making and plan of care. I have made any additions or clarifications directly to the above note. Agree with note above. She had an embolic right middle cerebral artery occlusion which has been successfully revascularized with mechanical embolectomy. She remains at risk for recurrent stroke and neurological worsening. Plan remove groin sheath. Extubate later today if tolerated. Strict control of blood pressure. Complete stroke workup.Check LE venous dopplers Long discussion with husband and daughter at the bedside regards to her stroke, risk for recurrence, stroke evaluation, treatment and answered questions.  This patient is critically ill and at significant risk of neurological worsening, death and care requires constant monitoring of vital signs, hemodynamics,respiratory and cardiac monitoring,review of multiple databases, neurological assessment, discussion with family, other specialists and medical decision making of  high complexity.I have made any additions or clarifications directly to the above note.  I spent 50 minutes of neurocritical care time  in the care of  this patient.  Antony Contras, MD Medical Director [facility] Pager: [phone] 09/06/2014 10:00 AM     To contact Stroke Continuity provider, please refer to [email or web address] After hours, contact General Neurology

## 2014-09-06 NOTE — Progress Notes (Signed)
9 Fr sheath removed with exoseal at 9:15. No complications. 10 lb sandbag applied.

## 2014-09-06 NOTE — Procedures (Signed)
Extubation Procedure Note  Patient Details:   Name: Christy Simmons DOB: 09-17-1934 MRN: [id number]   Airway Documentation:     Evaluation  O2 sats: Stable throughout procedure Complications: No apparent complications Patient did tolerate procedure well. Bilateral Breath Sounds: Clear Suctioning: Airway Yes   Pt placed on 4l [place] with O2 sats of 100%.  RN at bedside.  RT will continue to monitor  Closson, Deetta Perla 09/06/2014, 12:17 PM

## 2014-09-06 NOTE — Progress Notes (Signed)
Echocardiogram 2D Echocardiogram has been performed.  Doyle Askew 09/06/2014, 3:37 PM

## 2014-09-06 NOTE — Progress Notes (Signed)
PCCM NOTE  SUBJ: RASS 0. + F/C. Passed SBT. Extubated and tolerating  OBJ: Filed Vitals:   09/06/14 1155 09/06/14 1200 09/06/14 1230 09/06/14 1300  BP:  123/53 125/59 131/67  Pulse:  80 81 84  Temp: 100 F (37.8 C)     TempSrc: Axillary     Resp:  14 13 14   Height:      Weight:      SpO2:  100% 100% 100%    NAD + F/C Comfortable on PS 5 cm H2O with Vt > 600 cc HEENT: NCAT No JVD Chest clear Reg, no M noted NABS, soft No LE edema L hemiparesis  I have reviewed all of today's lab results. 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  After 5:30 PM or weekends, call [phone]

## 2014-09-06 NOTE — Progress Notes (Signed)
eLink Physician-Brief Progress Note Patient Name: Christy Simmons DOB: 11/01/1934 MRN: [id number]   Date of Service  09/06/2014  HPI/Events of Note  Transitioned off insulin gtt to lantus 10 units BID and q4 Hour SSI, moderate scale  eICU Interventions          Jackob [place] 09/06/2014, 3:34 AM

## 2014-09-06 NOTE — Progress Notes (Signed)
Pt's 3rd troponin was 1.5.  MD (Neuro-Stewart) notified via text message through [place].  Will continue to monitor.

## 2014-09-06 NOTE — Progress Notes (Signed)
SLP Cancellation Note  Patient Details Name: Christy Simmons MRN: [id number] DOB: 06-16-1935   Cancelled treatment:       Reason Eval/Treat Not Completed: Patient not medically ready. Pt remains intubated. Plan is for extubation today, but will still have femoral sheath and will need to lay flat. Will f/u tomorrow.   Herbie [place], [place] CCC-SLP [phone]  Lynann Beaver 09/06/2014, 8:19 AM

## 2014-09-06 NOTE — Progress Notes (Signed)
Subjective: Patient is intubated s/p right MCA revascularization 2/6  Allergies: Gabapentin and Codeine  Medications: Prior to Admission medications   Medication Sig Start Date End Date Taking? Authorizing Provider  aspirin EC 81 MG tablet Take 81 mg by mouth daily.   Yes Historical Provider, MD  brimonidine (ALPHAGAN) 0.2 % ophthalmic solution Place 1 drop into both eyes 2 (two) times daily.  11/18/12  Yes Historical Provider, MD  Calcium Citrate-Vitamin D 250-200 MG-UNIT TABS Take 1 tablet by mouth daily.   Yes Historical Provider, MD  Cholecalciferol (VITAMIN D3) 1000 UNITS CAPS Take 1,000 Units by mouth daily.   Yes Historical Provider, MD  esomeprazole (NEXIUM) 20 MG capsule Take 20 mg by mouth daily.    Yes Historical Provider, MD  ibuprofen (ADVIL,MOTRIN) 200 MG tablet Take 200 mg by mouth daily as needed for mild pain.    Yes Historical Provider, MD  insulin glargine (LANTUS) 100 UNIT/ML injection Inject 20 Units into the skin daily.    Yes Historical Provider, MD  insulin lispro (HUMALOG) 100 UNIT/ML injection Inject into the skin 3 (three) times daily before meals. Per sliding scale - average 15 units   Yes Historical Provider, MD  lisinopril-hydrochlorothiazide (PRINZIDE,ZESTORETIC) 20-25 MG per tablet Take 1 tablet by mouth daily.   Yes Historical Provider, MD  Magnesium 200 MG TABS Take 400 mg by mouth daily.   Yes Historical Provider, MD  metoCLOPramide (REGLAN) 5 MG tablet Take 5 mg by mouth 4 (four) times daily -  before meals and at bedtime.    Yes Historical Provider, MD  Misc Natural Products (OSTEO BI-FLEX ADV JOINT SHIELD) TABS Take 1 tablet by mouth daily.   Yes Historical Provider, MD  Multiple Vitamin (MULITIVITAMIN WITH MINERALS) TABS Take 1 tablet by mouth daily.   Yes Historical Provider, MD  traMADol (ULTRAM) 50 MG tablet Take 50-100 mg by mouth 2 (two) times daily as needed for moderate pain.  11/20/13  Yes Historical Provider, MD  diazepam (VALIUM) 5 MG tablet  Take 1 tablet (5 mg total) by mouth every 6 (six) hours as needed for muscle spasms. Patient not taking: Reported on 09/05/2014 09/13/13   Consuella Lose, MD  oxyCODONE-acetaminophen (PERCOCET/ROXICET) 5-325 MG per tablet Take 1-2 tablets by mouth every 4 (four) hours as needed for moderate pain. Patient not taking: Reported on 09/05/2014 09/13/13   Consuella Lose, MD    Review of Systems unable to be obtained, patient intubated/sedated   Vital Signs: BP 119/55 mmHg  Pulse 66  Temp(Src) 100 F (37.8 C) (Axillary)  Resp 20  Ht 5\' 4"  (1.626 m)  Wt 122 lb 5.7 oz (55.5 kg)  BMI 20.99 kg/m2  SpO2 100%  Physical Exam General: Intubated, opens eyes to verbal stimulus, family in room Ext: RCFA dressing C/D/I, soft, no signs of bleeding/hematoma, DP intact Neuro: Opens eyes to verbal stimulus, moves all 4 extremities with verbal stimulus  Imaging: Ct Angio Head W/cm &/or Wo Cm  09/05/2014   CLINICAL DATA:  Left-sided paralysis.  Acute stroke.  EXAM: CT ANGIOGRAPHY HEAD AND NECK  CT CEREBRAL PERFUSION  TECHNIQUE: Multidetector CT imaging of the head and neck was performed using the standard protocol during bolus administration of intravenous contrast. Multiplanar CT image reconstructions and MIPs were obtained to evaluate the vascular anatomy. Carotid stenosis measurements (when applicable) are obtained utilizing NASCET criteria, using the distal internal carotid diameter as the denominator. CT cerebral perfusion was performed during bolus IV contrast injection.  CONTRAST:  122mL OMNIPAQUE IOHEXOL 350  MG/ML SOLN  COMPARISON:  Head CT earlier today. Head MRI/ MRA 01/27/2013. Neck MRA 10/13/2011.  FINDINGS: CTA NECK  Aortic arch: 3 vessel aortic arch. The brachiocephalic and subclavian arteries are patent without stenosis.  Right carotid system: Mild, predominantly noncalcified plaque is present at the right carotid bifurcation. No common carotid or cervical internal carotid artery stenosis.  Left  carotid system: Mild calcified and noncalcified plaque at the carotid bifurcation without common carotid or cervical internal carotid artery stenosis.  Vertebral arteries:Vertebral arteries are patent without stenosis. Left vertebral artery is mildly dominant.  Skeleton: Moderate to severe multilevel cervical disc degeneration.  Other neck: The thyroid gland is heterogeneous and diffusely enlarged, more so in the right lobe. Multiple nodules are present. Dominant right-sided nodule measures 3.0 cm. There is a 2.0 cm nodule in the isthmus.  CTA HEAD  Anterior circulation: Internal carotid arteries are patent from skullbase to carotid termini. There is mild carotid siphon calcification bilaterally without significant stenosis. There is a distal M1 occlusion on the right. Contrast is seen within M2 and more distal MCA branch vessels, likely via collateral flow, however the number of MCA branch vessels is mildly diminished compared to the contralateral side. Left MCA and bilateral ACAs are unremarkable. No intracranial aneurysm is identified.  Posterior circulation: The intracranial vertebral arteries are patent to the basilar with the left being dominant. PICA origins and SCA origins are patent. Basilar artery is patent without stenosis. There are patent posterior communicating arteries bilaterally, right larger than left, and the right P1 segment is mildly hypoplastic. PCAs are otherwise unremarkable.  Venous sinuses: Patent.  Anatomic variants: Hypoplastic right P1.  Delayed phase: No abnormal brain parenchymal or meningeal enhancement. Asymmetric enhancement of right MCA branch vessels may reflect slow flow and collateral flow. Subtle asymmetric hypoattenuation in the right corona radiata and external capsule may reflect developing acute infarct.  CT PERFUSION  Cerebral perfusion images demonstrate abnormally prolonged mean transit time and time to peak throughout essentially the entirety of the right MCA territory  with corresponding decreased cerebral blood flow. Cerebral blood volume is maintained throughout much of this region, with some cortical/subcortical regions demonstrating slightly elevated CBV compared to the contralateral side. The deep white matter of the right corona radiata and centrum semiovale, particularly posteriorly, as well as posterior temporal lobe demonstrate more abnormal MTT and CBF parameters than the remainder of the MCA territory as well as slightly diminished CBV compared to the contralateral side and may reflect a developing core infarct in a deep watershed distribution. The basal ganglia are largely spared.  IMPRESSION: 1. Distal right M1 occlusion. 2. Abnormal perfusion throughout essentially the entirety of the right MCA territory. Much of this appears to represent oligemic brain with relatively preserved cerebral blood volume, however there is likely a developing region of core infarct involving the deep white matter.  Preliminary results were discussed in person at the time of interpretation on 09/05/2014 at 10:45 am with Dr. Dorian Pod , who verbally acknowledged these results.   Electronically Signed   By: Logan Bores   On: 09/05/2014 11:35   Ct Head Wo Contrast  09/05/2014   CLINICAL DATA:  Stroke. Left-sided weakness. Right MCA occlusion status post cerebral angiogram and revascularization.  EXAM: CT HEAD WITHOUT CONTRAST  TECHNIQUE: Contiguous axial images were obtained from the base of the skull through the vertex without intravenous contrast.  COMPARISON:  Head CT earlier today  FINDINGS: There is some residual intravascular contrast present from recent catheter  angiogram. There is mild contrast staining in the right basal ganglia. Subtly asymmetric hypoattenuation in the posterior right centrum semiovale and corona radiata is unchanged and could reflect early acute ischemic change or asymmetric chronic ischemia. Patchy hypodensities more anteriorly in the centrum semiovale/  corona radiata bilaterally are also unchanged and appear more chronic in nature. No definite acute large territory cortical infarct is identified. There is no evidence of acute intracranial hemorrhage, mass, midline shift, or extra-axial fluid collection. Mild generalized cerebral atrophy is noted.  Orbits are unremarkable. Visualized mastoid air cells are clear. Left sphenoid sinus fluid has mildly increased. Endotracheal and enteric tubes are partially visualized.  IMPRESSION: Sequelae of interval cerebral angiography and right MCA revascularization. No acute hemorrhage.   Electronically Signed   By: Logan Bores   On: 09/05/2014 14:41   Ct Head Wo Contrast  09/05/2014   CLINICAL DATA:  Left-sided weakness beginning earlier this morning. Post fall.  EXAM: CT HEAD WITHOUT CONTRAST  CT CERVICAL SPINE WITHOUT CONTRAST  TECHNIQUE: Multidetector CT imaging of the head and cervical spine was performed following the standard protocol without intravenous contrast. Multiplanar CT image reconstructions of the cervical spine were also generated.  COMPARISON:  Head CT - 12/18/2012; brain MRI - 01/27/2013; thyroid ultrasound - 08/17/2010  FINDINGS: CT HEAD FINDINGS  Examination is minimally degraded due to obliquity. Interval development of an ill-defined area of hypoattenuation within the bilateral centrum semiovale, right greater than left (representative images 18 through 20, series 201). These areas appear at least subacute given lack of adjacent vasogenic edema. The gray-white differentiation is otherwise well maintained. Similar findings of atrophy with bifrontal sulcal prominence. Unchanged size and configuration of the ventricles and basilar cisterns given obliquity. No midline shift. Intracranial atherosclerosis.  There is minimal mucosal thickening within the bilateral sphenoid sinuses. The remaining paranasal sinuses mastoid air cells are normally aerated. No air-fluid levels. Regional soft tissues appear normal.  No displaced calvarial fracture.  CT CERVICAL SPINE FINDINGS  C1 to the superior endplate of T3 is imaged.  Normal alignment of the cervical spine given the head being held in a minimal amount of left lateral flexion. No anterolisthesis or retrolisthesis. The bilateral facets are normally aligned. The dens is normally positioned between the lateral masses of C1. Moderate degenerative change of the atlantodental articulation  Cervical vertebral body heights are preserved. Prevertebral soft tissues are normal.  There is moderate to severe multilevel cervical spine DDD, worse at C4-C5, C5-C6 and C6-C7 with disc space height loss, endplate irregularity and posteriorly directed disc osteophyte complexes at these locations.  There is asymmetric enlargement of the right lobe of the thyroid and the thyroid isthmus with note made of an approximately 1.1 cm hyper attenuating nodule within the posterior aspect the right lobe of the thyroid (image 65, series 302) as well as an approximately 2.0 x 1.8 cm nodule within the thyroid isthmus (image 78).  Regional soft tissues appear otherwise normal. Limited visualization of lung apices is normal.  IMPRESSION: 1. Interval development of ill-defined hypo densities within the bilateral centrum semiovale, right greater than left, favored to represent progressive age-indeterminate though at least subacute microvascular ischemic disease continued since remote examination performed 11/2012. Otherwise, no definite acute intracranial process. 2. No fracture or static subluxation of the cervical spine. 3. Moderate to severe multilevel cervical spine DDD, worse at C4-C5, C5-C6 and C6-C7. 4. Asymmetric enlargement of the right lobe of the thyroid and thyroid isthmus with suspected thyroid nodules as detailed above. While these  nodules appear grossly similar to compared to the 07/2010 thyroid ultrasound, further evaluation could be performed with repeat dedicated non emergent thyroid ultrasound  as clinically indicated. Above findings discussed with Dr. Aram Beecham at 02/2012.   Electronically Signed   By: Sandi Mariscal M.D.   On: 09/05/2014 08:39   Ct Angio Neck W/cm &/or Wo/cm  09/05/2014   CLINICAL DATA:  Left-sided paralysis.  Acute stroke.  EXAM: CT ANGIOGRAPHY HEAD AND NECK  CT CEREBRAL PERFUSION  TECHNIQUE: Multidetector CT imaging of the head and neck was performed using the standard protocol during bolus administration of intravenous contrast. Multiplanar CT image reconstructions and MIPs were obtained to evaluate the vascular anatomy. Carotid stenosis measurements (when applicable) are obtained utilizing NASCET criteria, using the distal internal carotid diameter as the denominator. CT cerebral perfusion was performed during bolus IV contrast injection.  CONTRAST:  126mL OMNIPAQUE IOHEXOL 350 MG/ML SOLN  COMPARISON:  Head CT earlier today. Head MRI/ MRA 01/27/2013. Neck MRA 10/13/2011.  FINDINGS: CTA NECK  Aortic arch: 3 vessel aortic arch. The brachiocephalic and subclavian arteries are patent without stenosis.  Right carotid system: Mild, predominantly noncalcified plaque is present at the right carotid bifurcation. No common carotid or cervical internal carotid artery stenosis.  Left carotid system: Mild calcified and noncalcified plaque at the carotid bifurcation without common carotid or cervical internal carotid artery stenosis.  Vertebral arteries:Vertebral arteries are patent without stenosis. Left vertebral artery is mildly dominant.  Skeleton: Moderate to severe multilevel cervical disc degeneration.  Other neck: The thyroid gland is heterogeneous and diffusely enlarged, more so in the right lobe. Multiple nodules are present. Dominant right-sided nodule measures 3.0 cm. There is a 2.0 cm nodule in the isthmus.  CTA HEAD  Anterior circulation: Internal carotid arteries are patent from skullbase to carotid termini. There is mild carotid siphon calcification bilaterally without significant  stenosis. There is a distal M1 occlusion on the right. Contrast is seen within M2 and more distal MCA branch vessels, likely via collateral flow, however the number of MCA branch vessels is mildly diminished compared to the contralateral side. Left MCA and bilateral ACAs are unremarkable. No intracranial aneurysm is identified.  Posterior circulation: The intracranial vertebral arteries are patent to the basilar with the left being dominant. PICA origins and SCA origins are patent. Basilar artery is patent without stenosis. There are patent posterior communicating arteries bilaterally, right larger than left, and the right P1 segment is mildly hypoplastic. PCAs are otherwise unremarkable.  Venous sinuses: Patent.  Anatomic variants: Hypoplastic right P1.  Delayed phase: No abnormal brain parenchymal or meningeal enhancement. Asymmetric enhancement of right MCA branch vessels may reflect slow flow and collateral flow. Subtle asymmetric hypoattenuation in the right corona radiata and external capsule may reflect developing acute infarct.  CT PERFUSION  Cerebral perfusion images demonstrate abnormally prolonged mean transit time and time to peak throughout essentially the entirety of the right MCA territory with corresponding decreased cerebral blood flow. Cerebral blood volume is maintained throughout much of this region, with some cortical/subcortical regions demonstrating slightly elevated CBV compared to the contralateral side. The deep white matter of the right corona radiata and centrum semiovale, particularly posteriorly, as well as posterior temporal lobe demonstrate more abnormal MTT and CBF parameters than the remainder of the MCA territory as well as slightly diminished CBV compared to the contralateral side and may reflect a developing core infarct in a deep watershed distribution. The basal ganglia are largely spared.  IMPRESSION: 1. Distal right M1 occlusion.  2. Abnormal perfusion throughout essentially  the entirety of the right MCA territory. Much of this appears to represent oligemic brain with relatively preserved cerebral blood volume, however there is likely a developing region of core infarct involving the deep white matter.  Preliminary results were discussed in person at the time of interpretation on 09/05/2014 at 10:45 am with Dr. Dorian Pod , who verbally acknowledged these results.   Electronically Signed   By: Logan Bores   On: 09/05/2014 11:35   Ct Cervical Spine Wo Contrast  09/05/2014   CLINICAL DATA:  Left-sided weakness beginning earlier this morning. Post fall.  EXAM: CT HEAD WITHOUT CONTRAST  CT CERVICAL SPINE WITHOUT CONTRAST  TECHNIQUE: Multidetector CT imaging of the head and cervical spine was performed following the standard protocol without intravenous contrast. Multiplanar CT image reconstructions of the cervical spine were also generated.  COMPARISON:  Head CT - 12/18/2012; brain MRI - 01/27/2013; thyroid ultrasound - 08/17/2010  FINDINGS: CT HEAD FINDINGS  Examination is minimally degraded due to obliquity. Interval development of an ill-defined area of hypoattenuation within the bilateral centrum semiovale, right greater than left (representative images 18 through 20, series 201). These areas appear at least subacute given lack of adjacent vasogenic edema. The gray-white differentiation is otherwise well maintained. Similar findings of atrophy with bifrontal sulcal prominence. Unchanged size and configuration of the ventricles and basilar cisterns given obliquity. No midline shift. Intracranial atherosclerosis.  There is minimal mucosal thickening within the bilateral sphenoid sinuses. The remaining paranasal sinuses mastoid air cells are normally aerated. No air-fluid levels. Regional soft tissues appear normal. No displaced calvarial fracture.  CT CERVICAL SPINE FINDINGS  C1 to the superior endplate of T3 is imaged.  Normal alignment of the cervical spine given the head being  held in a minimal amount of left lateral flexion. No anterolisthesis or retrolisthesis. The bilateral facets are normally aligned. The dens is normally positioned between the lateral masses of C1. Moderate degenerative change of the atlantodental articulation  Cervical vertebral body heights are preserved. Prevertebral soft tissues are normal.  There is moderate to severe multilevel cervical spine DDD, worse at C4-C5, C5-C6 and C6-C7 with disc space height loss, endplate irregularity and posteriorly directed disc osteophyte complexes at these locations.  There is asymmetric enlargement of the right lobe of the thyroid and the thyroid isthmus with note made of an approximately 1.1 cm hyper attenuating nodule within the posterior aspect the right lobe of the thyroid (image 65, series 302) as well as an approximately 2.0 x 1.8 cm nodule within the thyroid isthmus (image 78).  Regional soft tissues appear otherwise normal. Limited visualization of lung apices is normal.  IMPRESSION: 1. Interval development of ill-defined hypo densities within the bilateral centrum semiovale, right greater than left, favored to represent progressive age-indeterminate though at least subacute microvascular ischemic disease continued since remote examination performed 11/2012. Otherwise, no definite acute intracranial process. 2. No fracture or static subluxation of the cervical spine. 3. Moderate to severe multilevel cervical spine DDD, worse at C4-C5, C5-C6 and C6-C7. 4. Asymmetric enlargement of the right lobe of the thyroid and thyroid isthmus with suspected thyroid nodules as detailed above. While these nodules appear grossly similar to compared to the 07/2010 thyroid ultrasound, further evaluation could be performed with repeat dedicated non emergent thyroid ultrasound as clinically indicated. Above findings discussed with Dr. Aram Beecham at 02/2012.   Electronically Signed   By: Sandi Mariscal M.D.   On: 09/05/2014 08:39   Dg Pelvis  Portable  09/05/2014   CLINICAL DATA:  Fall with right hip pain.  EXAM: PORTABLE PELVIS 1-2 VIEWS  COMPARISON:  12/24/2012  FINDINGS: Note that the very lateral aspect of the right trochanteric region is not imaged. There are mild symmetric degenerative changes of the hips unchanged. No evidence of acute fracture or dislocation. There are degenerative changes of the spine. There is mild fecal retention over the rectum.  IMPRESSION: No acute fracture or dislocation. Note that the lateral aspect of the trochanteric region of the right femur is not completely imaged. Recommend an additional cone-down view of the right hip for complete evaluation.   Electronically Signed   By: Marin Olp M.D.   On: 09/05/2014 10:57   Ct Cerebral Perfusion W/cm  09/05/2014   CLINICAL DATA:  Left-sided paralysis.  Acute stroke.  EXAM: CT ANGIOGRAPHY HEAD AND NECK  CT CEREBRAL PERFUSION  TECHNIQUE: Multidetector CT imaging of the head and neck was performed using the standard protocol during bolus administration of intravenous contrast. Multiplanar CT image reconstructions and MIPs were obtained to evaluate the vascular anatomy. Carotid stenosis measurements (when applicable) are obtained utilizing NASCET criteria, using the distal internal carotid diameter as the denominator. CT cerebral perfusion was performed during bolus IV contrast injection.  CONTRAST:  155mL OMNIPAQUE IOHEXOL 350 MG/ML SOLN  COMPARISON:  Head CT earlier today. Head MRI/ MRA 01/27/2013. Neck MRA 10/13/2011.  FINDINGS: CTA NECK  Aortic arch: 3 vessel aortic arch. The brachiocephalic and subclavian arteries are patent without stenosis.  Right carotid system: Mild, predominantly noncalcified plaque is present at the right carotid bifurcation. No common carotid or cervical internal carotid artery stenosis.  Left carotid system: Mild calcified and noncalcified plaque at the carotid bifurcation without common carotid or cervical internal carotid artery stenosis.   Vertebral arteries:Vertebral arteries are patent without stenosis. Left vertebral artery is mildly dominant.  Skeleton: Moderate to severe multilevel cervical disc degeneration.  Other neck: The thyroid gland is heterogeneous and diffusely enlarged, more so in the right lobe. Multiple nodules are present. Dominant right-sided nodule measures 3.0 cm. There is a 2.0 cm nodule in the isthmus.  CTA HEAD  Anterior circulation: Internal carotid arteries are patent from skullbase to carotid termini. There is mild carotid siphon calcification bilaterally without significant stenosis. There is a distal M1 occlusion on the right. Contrast is seen within M2 and more distal MCA branch vessels, likely via collateral flow, however the number of MCA branch vessels is mildly diminished compared to the contralateral side. Left MCA and bilateral ACAs are unremarkable. No intracranial aneurysm is identified.  Posterior circulation: The intracranial vertebral arteries are patent to the basilar with the left being dominant. PICA origins and SCA origins are patent. Basilar artery is patent without stenosis. There are patent posterior communicating arteries bilaterally, right larger than left, and the right P1 segment is mildly hypoplastic. PCAs are otherwise unremarkable.  Venous sinuses: Patent.  Anatomic variants: Hypoplastic right P1.  Delayed phase: No abnormal brain parenchymal or meningeal enhancement. Asymmetric enhancement of right MCA branch vessels may reflect slow flow and collateral flow. Subtle asymmetric hypoattenuation in the right corona radiata and external capsule may reflect developing acute infarct.  CT PERFUSION  Cerebral perfusion images demonstrate abnormally prolonged mean transit time and time to peak throughout essentially the entirety of the right MCA territory with corresponding decreased cerebral blood flow. Cerebral blood volume is maintained throughout much of this region, with some cortical/subcortical  regions demonstrating slightly elevated CBV compared to the contralateral  side. The deep white matter of the right corona radiata and centrum semiovale, particularly posteriorly, as well as posterior temporal lobe demonstrate more abnormal MTT and CBF parameters than the remainder of the MCA territory as well as slightly diminished CBV compared to the contralateral side and may reflect a developing core infarct in a deep watershed distribution. The basal ganglia are largely spared.  IMPRESSION: 1. Distal right M1 occlusion. 2. Abnormal perfusion throughout essentially the entirety of the right MCA territory. Much of this appears to represent oligemic brain with relatively preserved cerebral blood volume, however there is likely a developing region of core infarct involving the deep white matter.  Preliminary results were discussed in person at the time of interpretation on 09/05/2014 at 10:45 am with Dr. Dorian Pod , who verbally acknowledged these results.   Electronically Signed   By: Logan Bores   On: 09/05/2014 11:35   Dg Chest Port 1 View  09/05/2014   CLINICAL DATA:  Code stroke. History of TIA, hypertension and diabetes.  EXAM: PORTABLE CHEST - 1 VIEW  COMPARISON:  08/14/2013; 10/13/2011  FINDINGS: Grossly unchanged cardiac silhouette and mediastinal contours. No focal parenchymal opacities. No pleural effusion or pneumothorax. No evidence of edema. No acute osseus abnormalities.  IMPRESSION: No acute cardiopulmonary disease.   Electronically Signed   By: Sandi Mariscal M.D.   On: 09/05/2014 08:53   Dg Chest Port 1v Same Day  09/05/2014   CLINICAL DATA:  Respiratory failure.  EXAM: PORTABLE CHEST - 1 VIEW SAME DAY  COMPARISON:  09/05/2014 at 0838 hr.  FINDINGS: Endotracheal tube is in satisfactory position, terminating 5.1 cm above the carina. Nasogastric tube is followed into the stomach. Lungs are clear. A skin fold projects over the upper left hemi thorax. No pleural fluid.  IMPRESSION: No acute  findings.   Electronically Signed   By: Lorin Picket M.D.   On: 09/05/2014 19:00   Dg Hip Unilat With Pelvis 2-3 Views Right  09/05/2014   CLINICAL DATA:  80 year old female with right hip pain. Patient fell in bathroom earlier today.  EXAM: RIGHT HIP (WITH PELVIS) 2-3 VIEWS  COMPARISON:  None.  FINDINGS: No evidence of acute fracture or malalignment. The femoral head is located. The bones appear osteopenic. Foley catheter present within the bladder.  IMPRESSION: No evidence of acute fracture or malalignment   Electronically Signed   By: Jacqulynn Cadet M.D.   On: 09/05/2014 12:05    Labs:  CBC:  Recent Labs  09/08/13 0828 09/05/14 0744 09/05/14 0755 09/05/14 1302 09/06/14 0300  WBC 12.9* 23.6*  --   --  14.6*  HGB 11.9* 13.6 15.3* 12.2 10.4*  HCT 36.3 38.8 45.0 36.0 30.0*  PLT 229 154  --   --  130*    COAGS:  Recent Labs  09/05/14 0744  INR 1.07  APTT 28    BMP:  Recent Labs  09/08/13 0828 09/05/14 0744 09/05/14 0755 09/05/14 1302 09/06/14 0300  NA 139 129* 130* 130* 137  K 4.2 4.4 4.5 3.8 3.5  CL 100 95* 97  --  106  CO2 29 22  --   --  24  GLUCOSE 222* 518* 510*  --  129*  BUN 19 27* 32*  --  20  CALCIUM 9.7 9.0  --   --  8.2*  CREATININE 0.98 1.02 0.80  --  0.84  GFRNONAA 54* 52*  --   --  65*  GFRAA 63* 60*  --   --  76*    LIVER FUNCTION TESTS:  Recent Labs  09/05/14 0744  BILITOT 0.5  AST 30  ALT 23  ALKPHOS 71  PROT 6.3  ALBUMIN 3.6    Assessment and Plan: CVA S/p cerebral arteriogram and complete revascularization of occluded Right MCA Intubated, opens eyes to commands, moves all four extremities Await MRI today Plans per Neurology     I spent a total of 15 minutes face to face in clinical consultation/evaluation, greater than 50% of which was counseling/coordinating care   Signed: Hedy Jacob 09/06/2014, 12:06 PM

## 2014-09-06 NOTE — Progress Notes (Signed)
Pt's 2nd troponin was 1.34.  MD (Neuro-Stewart) notified.  No new orders given.  Will continue to monitor.

## 2014-09-07 ENCOUNTER — Encounter (HOSPITAL_COMMUNITY): Payer: Self-pay | Admitting: Interventional Radiology

## 2014-09-07 DIAGNOSIS — I82409 Acute embolism and thrombosis of unspecified deep veins of unspecified lower extremity: Secondary | ICD-10-CM

## 2014-09-07 DIAGNOSIS — I6340 Cerebral infarction due to embolism of unspecified cerebral artery: Secondary | ICD-10-CM

## 2014-09-07 DIAGNOSIS — E440 Moderate protein-calorie malnutrition: Secondary | ICD-10-CM | POA: Insufficient documentation

## 2014-09-07 DIAGNOSIS — G936 Cerebral edema: Secondary | ICD-10-CM | POA: Insufficient documentation

## 2014-09-07 LAB — GLUCOSE, CAPILLARY
GLUCOSE-CAPILLARY: 142 mg/dL — AB (ref 70–99)
Glucose-Capillary: 134 mg/dL — ABNORMAL HIGH (ref 70–99)

## 2014-09-07 LAB — HEMOGLOBIN A1C
Hgb A1c MFr Bld: 12 % — ABNORMAL HIGH (ref 4.8–5.6)
MEAN PLASMA GLUCOSE: 298 mg/dL

## 2014-09-07 MED ORDER — ASPIRIN EC 81 MG PO TBEC
81.0000 mg | DELAYED_RELEASE_TABLET | Freq: Every day | ORAL | Status: DC
  Administered 2014-09-07: 81 mg via ORAL
  Filled 2014-09-07 (×2): qty 1

## 2014-09-07 MED ORDER — OXYCODONE-ACETAMINOPHEN 5-325 MG PO TABS
1.0000 | ORAL_TABLET | ORAL | Status: DC | PRN
  Administered 2014-09-08 – 2014-09-10 (×4): 2 via ORAL
  Filled 2014-09-07 (×5): qty 2

## 2014-09-07 MED ORDER — CALCIUM CARBONATE-VITAMIN D 500-200 MG-UNIT PO TABS
1.0000 | ORAL_TABLET | Freq: Every day | ORAL | Status: DC
  Administered 2014-09-07 – 2014-09-10 (×2): 1 via ORAL
  Filled 2014-09-07 (×3): qty 1

## 2014-09-07 MED ORDER — BRIMONIDINE TARTRATE 0.2 % OP SOLN
1.0000 [drp] | Freq: Two times a day (BID) | OPHTHALMIC | Status: DC
  Administered 2014-09-07 – 2014-09-10 (×6): 1 [drp] via OPHTHALMIC
  Filled 2014-09-07 (×2): qty 5

## 2014-09-07 MED ORDER — DIAZEPAM 5 MG PO TABS: 5.0000 mg | ORAL_TABLET | Freq: Four times a day (QID) | ORAL | Status: DC | PRN

## 2014-09-07 MED ORDER — METOCLOPRAMIDE HCL 5 MG PO TABS
5.0000 mg | ORAL_TABLET | Freq: Three times a day (TID) | ORAL | Status: DC
  Administered 2014-09-07 – 2014-09-10 (×12): 5 mg via ORAL
  Filled 2014-09-07 (×15): qty 1

## 2014-09-07 MED ORDER — ADULT MULTIVITAMIN W/MINERALS CH
1.0000 | ORAL_TABLET | Freq: Every day | ORAL | Status: DC
  Administered 2014-09-07 – 2014-09-10 (×3): 1 via ORAL
  Filled 2014-09-07 (×3): qty 1

## 2014-09-07 MED ORDER — INSULIN GLARGINE 100 UNIT/ML ~~LOC~~ SOLN
20.0000 [IU] | Freq: Every day | SUBCUTANEOUS | Status: DC
  Administered 2014-09-08 – 2014-09-10 (×3): 20 [IU] via SUBCUTANEOUS
  Filled 2014-09-07 (×4): qty 0.2

## 2014-09-07 MED ORDER — TRAMADOL HCL 50 MG PO TABS
50.0000 mg | ORAL_TABLET | Freq: Two times a day (BID) | ORAL | Status: DC | PRN
  Administered 2014-09-10: 100 mg via ORAL
  Filled 2014-09-07: qty 2

## 2014-09-07 MED ORDER — CALCIUM CITRATE-VITAMIN D 250-200 MG-UNIT PO TABS: 1.0000 | ORAL_TABLET | Freq: Every day | ORAL | Status: DC

## 2014-09-07 NOTE — Consult Note (Signed)
Physical Medicine and Rehabilitation Consult Reason for Consult: Right MCA infarct Referring Physician: Dr. Leonie Man   HPI: Christy Simmons is a 80 y.o. right handed female with history of hypertension, diabetes mellitus and peripheral neuropathy, PFO with atrial septal aneurysm maintained on aspirin 81 mg daily. Independent prior to admission living with her husband. Presented 09/05/2014 with left-sided weakness and altered mental status. CT of the head show well-defined area of hypodensity around the right basal ganglia. Patient did not receive TPA. CT angiogram head and neck showed distal right M1 occlusion, abnormal perfusion throughout essentially the entirety of the right MCA territory. Underwent complete revascularization of occluded right MCA per interventional radiology. MRI of the brain shows multiple areas of acute infarct in the right MCA territory involving the right insula, posterior basal ganglia and right parietal lobe indicating of a cerebral MRI. Echocardiogram with ejection fraction of 51% grade 1 diastolic dysfunction. Patient did require intubation followed by critical care and extubated without difficulty 09/07/2014. She currently remains nothing by mouth. Neurology follow-up with workup ongoing. Await formal physical occupational therapy evaluations. M.D. has requested physical medicine rehabilitation consult.   Review of Systems  Gastrointestinal: Positive for constipation.       GERD  Musculoskeletal: Positive for myalgias.  Neurological: Positive for headaches.  All other systems reviewed and are negative.  Past Medical History  Diagnosis Date  . Transient ischemic attack   . HTN (hypertension)   . Hyperlipidemia   . DM (diabetes mellitus)   . GERD (gastroesophageal reflux disease)   . Heart disease     PFO  . Amnesia 01/10/2013  . Patent foramen ovale with atrial septal aneurysm   . Complication of anesthesia   . PONV (postoperative nausea and vomiting)   .  Family history of anesthesia complication     "My brother has trouble waking up."  . Glaucoma     Hx: of  . Early cataracts, bilateral     Hx: of  . Arthritis    Past Surgical History  Procedure Laterality Date  . US echocardiography  10-13-11    EF 60-65% normal  . Back surgery      disc   . Tonsillectomy    . Tee without cardioversion  10/30/2011    Procedure: TRANSESOPHAGEAL ECHOCARDIOGRAM (TEE);  Surgeon: Larey Dresser, MD;  Location: [facility];  Service: Cardiovascular;  Laterality: N/A;  . Colonoscopy w/ biopsies and polypectomy      HX;of  . Tonsillectomy    . Tubal ligation    . Lumbar laminectomy/decompression microdiscectomy N/A 09/11/2013    Procedure: LUMBAR LAMINECTOMY/DECOMPRESSION MICRODISCECTOMY 2 LEVELS;  Surgeon: Ophelia Charter, MD;  Location: [facility];  Service: Neurosurgery;  Laterality: N/A;  Lumbar Four-Five Laminectomy and Foraminotomy with redo Lumbar Five-Sacral One diskectomy   Family History  Problem Relation Age of Onset  . Stroke      No family history of such  . Congenital heart disease      No family history of such  . Anesthesia problems Neg Hx   . Diabetes Maternal Grandfather    Social History:  reports that she has never smoked. She has never used smokeless tobacco. She reports that she does not drink alcohol or use illicit drugs. Allergies:  Allergies  Allergen Reactions  . Gabapentin Other (See Comments)    Caused depression  . Codeine     Headache   Medications Prior to Admission  Medication Sig Dispense Refill  .  aspirin EC 81 MG tablet Take 81 mg by mouth daily.    . brimonidine (ALPHAGAN) 0.2 % ophthalmic solution Place 1 drop into both eyes 2 (two) times daily.     . Calcium Citrate-Vitamin D 250-200 MG-UNIT TABS Take 1 tablet by mouth daily.    . Cholecalciferol (VITAMIN D3) 1000 UNITS CAPS Take 1,000 Units by mouth daily.    Marland Kitchen esomeprazole (NEXIUM) 20 MG capsule Take 20 mg by mouth daily.     Marland Kitchen ibuprofen  (ADVIL,MOTRIN) 200 MG tablet Take 200 mg by mouth daily as needed for mild pain.     Marland Kitchen insulin glargine (LANTUS) 100 UNIT/ML injection Inject 20 Units into the skin daily.     . insulin lispro (HUMALOG) 100 UNIT/ML injection Inject into the skin 3 (three) times daily before meals. Per sliding scale - average 15 units    . lisinopril-hydrochlorothiazide (PRINZIDE,ZESTORETIC) 20-25 MG per tablet Take 1 tablet by mouth daily.    . Magnesium 200 MG TABS Take 400 mg by mouth daily.    . metoCLOPramide (REGLAN) 5 MG tablet Take 5 mg by mouth 4 (four) times daily -  before meals and at bedtime.     . Misc Natural Products (OSTEO BI-FLEX ADV JOINT SHIELD) TABS Take 1 tablet by mouth daily.    . Multiple Vitamin (MULITIVITAMIN WITH MINERALS) TABS Take 1 tablet by mouth daily.    . traMADol (ULTRAM) 50 MG tablet Take 50-100 mg by mouth 2 (two) times daily as needed for moderate pain.     . diazepam (VALIUM) 5 MG tablet Take 1 tablet (5 mg total) by mouth every 6 (six) hours as needed for muscle spasms. (Patient not taking: Reported on 09/05/2014) 60 tablet 0  . oxyCODONE-acetaminophen (PERCOCET/ROXICET) 5-325 MG per tablet Take 1-2 tablets by mouth every 4 (four) hours as needed for moderate pain. (Patient not taking: Reported on 09/05/2014) 60 tablet 0    Home: Home Living Family/patient expects to be discharged to:: Private residence Living Arrangements: Spouse/significant other Type of Home: House Home Access: Stairs to enter Technical brewer of Steps: 2 Entrance Stairs-Rails: None Home Layout: One level Home Equipment: Environmental consultant - 2 wheels, Cane - single point, Bedside commode Additional Comments: walk in shower and tub shower  Functional History:   Functional Status:  Mobility:          ADL:    Cognition: Cognition Orientation Level: Oriented X4    Blood pressure 119/55, pulse 76, temperature 98 F (36.7 C), temperature source Oral, resp. rate 12, height 5\' 4"  (1.626 m), weight  55.5 kg (122 lb 5.7 oz), SpO2 100 %. Physical Exam  HENT:  Left facial weakness  Eyes:  Pupils reactive to light without nystagmus  Neck: Normal range of motion. Neck supple. No thyromegaly present.  Cardiovascular:  Cardiac rate control  Respiratory: Effort normal and breath sounds normal.  Limited inspiratory effort  GI: Soft. Bowel sounds are normal. She exhibits no distension.  Neurological:  Pt appears fatigued but focuses with cueing. She was able to state her first and last name as well as age and date of birth. Followed simple one-step commands. Her voice is but a whisper but intelligible. Left facial droop. Moves all 4's but seems to be faster with right side---very inconsistent due to her fatigue  Skin: Skin is warm and dry.  Psychiatric:  flat    Results for orders placed or performed during the hospital encounter of 09/05/14 (from the past 24 hour(s))  Glucose, capillary  Status: Abnormal   Collection Time: 09/06/14 11:08 AM  Result Value Ref Range   Glucose-Capillary 111 (H) 70 - 99 mg/dL  Glucose, capillary     Status: Abnormal   Collection Time: 09/06/14  3:53 PM  Result Value Ref Range   Glucose-Capillary 123 (H) 70 - 99 mg/dL   Comment 1 Notify RN    Comment 2 Documented in Chart   Glucose, capillary     Status: Abnormal   Collection Time: 09/06/14  7:58 PM  Result Value Ref Range   Glucose-Capillary 55 (L) 70 - 99 mg/dL   Comment 1 Notify RN    Comment 2 Documented in Chart   Glucose, capillary     Status: Abnormal   Collection Time: 09/06/14 11:08 PM  Result Value Ref Range   Glucose-Capillary 142 (H) 70 - 99 mg/dL   Comment 1 Notify RN    Comment 2 Documented in Chart   Glucose, capillary     Status: Abnormal   Collection Time: 09/07/14  3:56 AM  Result Value Ref Range   Glucose-Capillary 134 (H) 70 - 99 mg/dL   Ct Angio Head W/cm &/or Wo Cm  09/05/2014   CLINICAL DATA:  Left-sided paralysis.  Acute stroke.  EXAM: CT ANGIOGRAPHY HEAD AND NECK  CT  CEREBRAL PERFUSION  TECHNIQUE: Multidetector CT imaging of the head and neck was performed using the standard protocol during bolus administration of intravenous contrast. Multiplanar CT image reconstructions and MIPs were obtained to evaluate the vascular anatomy. Carotid stenosis measurements (when applicable) are obtained utilizing NASCET criteria, using the distal internal carotid diameter as the denominator. CT cerebral perfusion was performed during bolus IV contrast injection.  CONTRAST:  153mL OMNIPAQUE IOHEXOL 350 MG/ML SOLN  COMPARISON:  Head CT earlier today. Head MRI/ MRA 01/27/2013. Neck MRA 10/13/2011.  FINDINGS: CTA NECK  Aortic arch: 3 vessel aortic arch. The brachiocephalic and subclavian arteries are patent without stenosis.  Right carotid system: Mild, predominantly noncalcified plaque is present at the right carotid bifurcation. No common carotid or cervical internal carotid artery stenosis.  Left carotid system: Mild calcified and noncalcified plaque at the carotid bifurcation without common carotid or cervical internal carotid artery stenosis.  Vertebral arteries:Vertebral arteries are patent without stenosis. Left vertebral artery is mildly dominant.  Skeleton: Moderate to severe multilevel cervical disc degeneration.  Other neck: The thyroid gland is heterogeneous and diffusely enlarged, more so in the right lobe. Multiple nodules are present. Dominant right-sided nodule measures 3.0 cm. There is a 2.0 cm nodule in the isthmus.  CTA HEAD  Anterior circulation: Internal carotid arteries are patent from skullbase to carotid termini. There is mild carotid siphon calcification bilaterally without significant stenosis. There is a distal M1 occlusion on the right. Contrast is seen within M2 and more distal MCA branch vessels, likely via collateral flow, however the number of MCA branch vessels is mildly diminished compared to the contralateral side. Left MCA and bilateral ACAs are unremarkable. No  intracranial aneurysm is identified.  Posterior circulation: The intracranial vertebral arteries are patent to the basilar with the left being dominant. PICA origins and SCA origins are patent. Basilar artery is patent without stenosis. There are patent posterior communicating arteries bilaterally, right larger than left, and the right P1 segment is mildly hypoplastic. PCAs are otherwise unremarkable.  Venous sinuses: Patent.  Anatomic variants: Hypoplastic right P1.  Delayed phase: No abnormal brain parenchymal or meningeal enhancement. Asymmetric enhancement of right MCA branch vessels may reflect slow flow and collateral  flow. Subtle asymmetric hypoattenuation in the right corona radiata and external capsule may reflect developing acute infarct.  CT PERFUSION  Cerebral perfusion images demonstrate abnormally prolonged mean transit time and time to peak throughout essentially the entirety of the right MCA territory with corresponding decreased cerebral blood flow. Cerebral blood volume is maintained throughout much of this region, with some cortical/subcortical regions demonstrating slightly elevated CBV compared to the contralateral side. The deep white matter of the right corona radiata and centrum semiovale, particularly posteriorly, as well as posterior temporal lobe demonstrate more abnormal MTT and CBF parameters than the remainder of the MCA territory as well as slightly diminished CBV compared to the contralateral side and may reflect a developing core infarct in a deep watershed distribution. The basal ganglia are largely spared.  IMPRESSION: 1. Distal right M1 occlusion. 2. Abnormal perfusion throughout essentially the entirety of the right MCA territory. Much of this appears to represent oligemic brain with relatively preserved cerebral blood volume, however there is likely a developing region of core infarct involving the deep white matter.  Preliminary results were discussed in person at the time of  interpretation on 09/05/2014 at 10:45 am with Dr. Dorian Pod , who verbally acknowledged these results.   Electronically Signed   By: Logan Bores   On: 09/05/2014 11:35   Ct Head Wo Contrast  09/05/2014   CLINICAL DATA:  Stroke. Left-sided weakness. Right MCA occlusion status post cerebral angiogram and revascularization.  EXAM: CT HEAD WITHOUT CONTRAST  TECHNIQUE: Contiguous axial images were obtained from the base of the skull through the vertex without intravenous contrast.  COMPARISON:  Head CT earlier today  FINDINGS: There is some residual intravascular contrast present from recent catheter angiogram. There is mild contrast staining in the right basal ganglia. Subtly asymmetric hypoattenuation in the posterior right centrum semiovale and corona radiata is unchanged and could reflect early acute ischemic change or asymmetric chronic ischemia. Patchy hypodensities more anteriorly in the centrum semiovale/ corona radiata bilaterally are also unchanged and appear more chronic in nature. No definite acute large territory cortical infarct is identified. There is no evidence of acute intracranial hemorrhage, mass, midline shift, or extra-axial fluid collection. Mild generalized cerebral atrophy is noted.  Orbits are unremarkable. Visualized mastoid air cells are clear. Left sphenoid sinus fluid has mildly increased. Endotracheal and enteric tubes are partially visualized.  IMPRESSION: Sequelae of interval cerebral angiography and right MCA revascularization. No acute hemorrhage.   Electronically Signed   By: Logan Bores   On: 09/05/2014 14:41   Ct Angio Neck W/cm &/or Wo/cm  09/05/2014   CLINICAL DATA:  Left-sided paralysis.  Acute stroke.  EXAM: CT ANGIOGRAPHY HEAD AND NECK  CT CEREBRAL PERFUSION  TECHNIQUE: Multidetector CT imaging of the head and neck was performed using the standard protocol during bolus administration of intravenous contrast. Multiplanar CT image reconstructions and MIPs were obtained to  evaluate the vascular anatomy. Carotid stenosis measurements (when applicable) are obtained utilizing NASCET criteria, using the distal internal carotid diameter as the denominator. CT cerebral perfusion was performed during bolus IV contrast injection.  CONTRAST:  151mL OMNIPAQUE IOHEXOL 350 MG/ML SOLN  COMPARISON:  Head CT earlier today. Head MRI/ MRA 01/27/2013. Neck MRA 10/13/2011.  FINDINGS: CTA NECK  Aortic arch: 3 vessel aortic arch. The brachiocephalic and subclavian arteries are patent without stenosis.  Right carotid system: Mild, predominantly noncalcified plaque is present at the right carotid bifurcation. No common carotid or cervical internal carotid artery stenosis.  Left carotid  system: Mild calcified and noncalcified plaque at the carotid bifurcation without common carotid or cervical internal carotid artery stenosis.  Vertebral arteries:Vertebral arteries are patent without stenosis. Left vertebral artery is mildly dominant.  Skeleton: Moderate to severe multilevel cervical disc degeneration.  Other neck: The thyroid gland is heterogeneous and diffusely enlarged, more so in the right lobe. Multiple nodules are present. Dominant right-sided nodule measures 3.0 cm. There is a 2.0 cm nodule in the isthmus.  CTA HEAD  Anterior circulation: Internal carotid arteries are patent from skullbase to carotid termini. There is mild carotid siphon calcification bilaterally without significant stenosis. There is a distal M1 occlusion on the right. Contrast is seen within M2 and more distal MCA branch vessels, likely via collateral flow, however the number of MCA branch vessels is mildly diminished compared to the contralateral side. Left MCA and bilateral ACAs are unremarkable. No intracranial aneurysm is identified.  Posterior circulation: The intracranial vertebral arteries are patent to the basilar with the left being dominant. PICA origins and SCA origins are patent. Basilar artery is patent without  stenosis. There are patent posterior communicating arteries bilaterally, right larger than left, and the right P1 segment is mildly hypoplastic. PCAs are otherwise unremarkable.  Venous sinuses: Patent.  Anatomic variants: Hypoplastic right P1.  Delayed phase: No abnormal brain parenchymal or meningeal enhancement. Asymmetric enhancement of right MCA branch vessels may reflect slow flow and collateral flow. Subtle asymmetric hypoattenuation in the right corona radiata and external capsule may reflect developing acute infarct.  CT PERFUSION  Cerebral perfusion images demonstrate abnormally prolonged mean transit time and time to peak throughout essentially the entirety of the right MCA territory with corresponding decreased cerebral blood flow. Cerebral blood volume is maintained throughout much of this region, with some cortical/subcortical regions demonstrating slightly elevated CBV compared to the contralateral side. The deep white matter of the right corona radiata and centrum semiovale, particularly posteriorly, as well as posterior temporal lobe demonstrate more abnormal MTT and CBF parameters than the remainder of the MCA territory as well as slightly diminished CBV compared to the contralateral side and may reflect a developing core infarct in a deep watershed distribution. The basal ganglia are largely spared.  IMPRESSION: 1. Distal right M1 occlusion. 2. Abnormal perfusion throughout essentially the entirety of the right MCA territory. Much of this appears to represent oligemic brain with relatively preserved cerebral blood volume, however there is likely a developing region of core infarct involving the deep white matter.  Preliminary results were discussed in person at the time of interpretation on 09/05/2014 at 10:45 am with Dr. Dorian Pod , who verbally acknowledged these results.   Electronically Signed   By: Logan Bores   On: 09/05/2014 11:35   Mr Brain Wo Contrast  09/06/2014   CLINICAL DATA:   Stroke. Right MCA occlusion with clot removal 09/05/2014.  EXAM: MRI HEAD WITHOUT CONTRAST  TECHNIQUE: Multiplanar, multiecho pulse sequences of the brain and surrounding structures were obtained without intravenous contrast.  COMPARISON:  CT 09/05/2014  FINDINGS: Multiple areas of acute infarct are identified. Scattered areas of acute infarct in the right MCA territory involving the right insula, right posterior basal ganglia, and right parietal lobe. Mild amount of hemorrhage in the right posterior putamen.  Small areas of acute infarct in the left parietal cortex. Acute infarct in the PICA territory bilaterally. Small area of acute infarct in the left occipital lobe.  This pattern suggests cerebral emboli.  Ventricle size is normal. No shift of the  midline structures. Negative for mass lesion.  Air-fluid level in the sphenoid sinus. Maxillary sinuses are clear bilaterally.  IMPRESSION: Multiple areas of acute infarct are present indicative of cerebral emboli.  Postop re- canalization of the right MCA with a small amount of hemorrhage in the right putamen. There are scattered areas of infarct involving the right insula and right parietal lobe however there is no lobar right MCA infarct.   Electronically Signed   By: Franchot Gallo M.D.   On: 09/06/2014 18:33   Dg Pelvis Portable  09/05/2014   CLINICAL DATA:  Fall with right hip pain.  EXAM: PORTABLE PELVIS 1-2 VIEWS  COMPARISON:  12/24/2012  FINDINGS: Note that the very lateral aspect of the right trochanteric region is not imaged. There are mild symmetric degenerative changes of the hips unchanged. No evidence of acute fracture or dislocation. There are degenerative changes of the spine. There is mild fecal retention over the rectum.  IMPRESSION: No acute fracture or dislocation. Note that the lateral aspect of the trochanteric region of the right femur is not completely imaged. Recommend an additional cone-down view of the right hip for complete evaluation.    Electronically Signed   By: Marin Olp M.D.   On: 09/05/2014 10:57   Ct Cerebral Perfusion W/cm  09/05/2014   CLINICAL DATA:  Left-sided paralysis.  Acute stroke.  EXAM: CT ANGIOGRAPHY HEAD AND NECK  CT CEREBRAL PERFUSION  TECHNIQUE: Multidetector CT imaging of the head and neck was performed using the standard protocol during bolus administration of intravenous contrast. Multiplanar CT image reconstructions and MIPs were obtained to evaluate the vascular anatomy. Carotid stenosis measurements (when applicable) are obtained utilizing NASCET criteria, using the distal internal carotid diameter as the denominator. CT cerebral perfusion was performed during bolus IV contrast injection.  CONTRAST:  181mL OMNIPAQUE IOHEXOL 350 MG/ML SOLN  COMPARISON:  Head CT earlier today. Head MRI/ MRA 01/27/2013. Neck MRA 10/13/2011.  FINDINGS: CTA NECK  Aortic arch: 3 vessel aortic arch. The brachiocephalic and subclavian arteries are patent without stenosis.  Right carotid system: Mild, predominantly noncalcified plaque is present at the right carotid bifurcation. No common carotid or cervical internal carotid artery stenosis.  Left carotid system: Mild calcified and noncalcified plaque at the carotid bifurcation without common carotid or cervical internal carotid artery stenosis.  Vertebral arteries:Vertebral arteries are patent without stenosis. Left vertebral artery is mildly dominant.  Skeleton: Moderate to severe multilevel cervical disc degeneration.  Other neck: The thyroid gland is heterogeneous and diffusely enlarged, more so in the right lobe. Multiple nodules are present. Dominant right-sided nodule measures 3.0 cm. There is a 2.0 cm nodule in the isthmus.  CTA HEAD  Anterior circulation: Internal carotid arteries are patent from skullbase to carotid termini. There is mild carotid siphon calcification bilaterally without significant stenosis. There is a distal M1 occlusion on the right. Contrast is seen within M2 and  more distal MCA branch vessels, likely via collateral flow, however the number of MCA branch vessels is mildly diminished compared to the contralateral side. Left MCA and bilateral ACAs are unremarkable. No intracranial aneurysm is identified.  Posterior circulation: The intracranial vertebral arteries are patent to the basilar with the left being dominant. PICA origins and SCA origins are patent. Basilar artery is patent without stenosis. There are patent posterior communicating arteries bilaterally, right larger than left, and the right P1 segment is mildly hypoplastic. PCAs are otherwise unremarkable.  Venous sinuses: Patent.  Anatomic variants: Hypoplastic right P1.  Delayed phase:  No abnormal brain parenchymal or meningeal enhancement. Asymmetric enhancement of right MCA branch vessels may reflect slow flow and collateral flow. Subtle asymmetric hypoattenuation in the right corona radiata and external capsule may reflect developing acute infarct.  CT PERFUSION  Cerebral perfusion images demonstrate abnormally prolonged mean transit time and time to peak throughout essentially the entirety of the right MCA territory with corresponding decreased cerebral blood flow. Cerebral blood volume is maintained throughout much of this region, with some cortical/subcortical regions demonstrating slightly elevated CBV compared to the contralateral side. The deep white matter of the right corona radiata and centrum semiovale, particularly posteriorly, as well as posterior temporal lobe demonstrate more abnormal MTT and CBF parameters than the remainder of the MCA territory as well as slightly diminished CBV compared to the contralateral side and may reflect a developing core infarct in a deep watershed distribution. The basal ganglia are largely spared.  IMPRESSION: 1. Distal right M1 occlusion. 2. Abnormal perfusion throughout essentially the entirety of the right MCA territory. Much of this appears to represent oligemic  brain with relatively preserved cerebral blood volume, however there is likely a developing region of core infarct involving the deep white matter.  Preliminary results were discussed in person at the time of interpretation on 09/05/2014 at 10:45 am with Dr. Dorian Pod , who verbally acknowledged these results.   Electronically Signed   By: Logan Bores   On: 09/05/2014 11:35   Dg Chest Port 1v Same Day  09/05/2014   CLINICAL DATA:  Respiratory failure.  EXAM: PORTABLE CHEST - 1 VIEW SAME DAY  COMPARISON:  09/05/2014 at 0838 hr.  FINDINGS: Endotracheal tube is in satisfactory position, terminating 5.1 cm above the carina. Nasogastric tube is followed into the stomach. Lungs are clear. A skin fold projects over the upper left hemi thorax. No pleural fluid.  IMPRESSION: No acute findings.   Electronically Signed   By: Lorin Picket M.D.   On: 09/05/2014 19:00   Dg Hip Unilat With Pelvis 2-3 Views Right  09/05/2014   CLINICAL DATA:  80 year old female with right hip pain. Patient fell in bathroom earlier today.  EXAM: RIGHT HIP (WITH PELVIS) 2-3 VIEWS  COMPARISON:  None.  FINDINGS: No evidence of acute fracture or malalignment. The femoral head is located. The bones appear osteopenic. Foley catheter present within the bladder.  IMPRESSION: No evidence of acute fracture or malalignment   Electronically Signed   By: Jacqulynn Cadet M.D.   On: 09/05/2014 12:05    Assessment/Plan: Diagnosis: right basal ganglia infarct 1. Does the need for close, 24 hr/day medical supervision in concert with the patient's rehab needs make it unreasonable for this patient to be served in a less intensive setting? Yes 2. Co-Morbidities requiring supervision/potential complications: acute respiratory failure, dysphagia 3. Due to bladder management, bowel management, safety, skin/wound care, disease management, medication administration, pain management and patient education, does the patient require 24 hr/day rehab nursing?  Yes 4. Does the patient require coordinated care of a physician, rehab nurse, PT (1-2 hrs/day, 5 days/week), OT (1-2 hrs/day, 5 days/week) and SLP (1-2 hrs/day, 5 days/week) to address physical and functional deficits in the context of the above medical diagnosis(es)? Yes Addressing deficits in the following areas: balance, endurance, locomotion, strength, transferring, bowel/bladder control, bathing, dressing, feeding, grooming, toileting, cognition, speech, swallowing and psychosocial support 5. Can the patient actively participate in an intensive therapy program of at least 3 hrs of therapy per day at least 5 days per week? Yes  6. The potential for patient to make measurable gains while on inpatient rehab is excellent 7. Anticipated functional outcomes upon discharge from inpatient rehab are supervision  with PT, supervision with OT, supervision with SLP. 8. Estimated rehab length of stay to reach the above functional goals is: 15-20 days 9. Does the patient have adequate social supports and living environment to accommodate these discharge functional goals? Yes 10. Anticipated D/C setting: Home 11. Anticipated post D/C treatments: HH therapy and outpt therapies 12. Overall Rehab/Functional Prognosis: excellent  RECOMMENDATIONS: This patient's condition is appropriate for continued rehabilitative care in the following setting: CIR Patient has agreed to participate in recommended program. Yes Note that insurance prior authorization may be required for reimbursement for recommended care.  Comment: Rehab Admissions Coordinator to follow up.  Thanks,  Meredith Staggers, MD, Mellody Drown     09/07/2014

## 2014-09-07 NOTE — Progress Notes (Signed)
INITIAL NUTRITION ASSESSMENT  DOCUMENTATION CODES Per approved criteria  -Non-severe (moderate) malnutrition in the context of chronic illness   INTERVENTION: Supplement diet as appropriate  If unable to pass a swallow recommend: Initiate Osmolite 1.2 @ 20 ml/hr via nasoenteric feeding tube and increase by 10 ml every 4 hours to goal rate of 50 ml/hr.   Tube feeding regimen provides 1440 kcal, 67 grams of protein, and 984 ml of H2O.   NUTRITION DIAGNOSIS: Malnutrition related to chronic illness as evidenced by mild/moderate muscle depletion and intake of < 75% of her needs for >/= 1 month.  Goal: Pt to meet >/= 90% of their estimated nutrition needs   Monitor:  Diet advancement, po intake, weight trends  Reason for Assessment: Pt identified as at nutrition risk on the Malnutrition Screen Tool  80 y.o. female  Admitting Dx: Stroke  ASSESSMENT: Pt admitted with right MCA occlusion s/p revascularization.   Pt extubated but continues to have a NG tube in place.  Pt failed beside swallow eval today with MBS planned for 2/9.  Per pt she has been losing weight over the last year. She had been weighing 141 lb at that time. She has been feeling sick and has nausea and sometimes vomits after meals. She takes a little green pill to help her.  Pt has lost 13% of her body weight in the last year.  She is not specific about what she usually eats at home but both pt and daughter report that it is usually very little.  Pt does sometimes drink Boost that her husband purchases for her.   Nutrition Focused Physical Exam:  Subcutaneous Fat:  Orbital Region: WDL Upper Arm Region: WDL Thoracic and Lumbar Region: WDL  Muscle:  Temple Region: severe depletion Clavicle Bone Region: mild/moderate depletion Clavicle and Acromion Bone Region: mild/moderate depletion Scapular Bone Region: NA Dorsal Hand: WDL Patellar Region: WDL Anterior Thigh Region: mild/moderate depletion Posterior Calf  Region: mild/moderate depletion  Edema: not present   Height: Ht Readings from Last 1 Encounters:  09/05/14 5\' 4"  (1.626 m)    Weight: Wt Readings from Last 1 Encounters:  09/06/14 122 lb 5.7 oz (55.5 kg)    Ideal Body Weight: 54.5 kg   % Ideal Body Weight: 102%  Wt Readings from Last 10 Encounters:  09/06/14 122 lb 5.7 oz (55.5 kg)  09/11/13 141 lb 15.6 oz (64.4 kg)  08/29/13 133 lb 12.8 oz (60.691 kg)  06/16/13 135 lb (61.236 kg)  01/10/13 150 lb (68.04 kg)  11/10/11 150 lb (68.04 kg)  10/26/11 146 lb 12.8 oz (66.588 kg)    Usual Body Weight: 141 lb   % Usual Body Weight: 87%  BMI:  Body mass index is 20.99 kg/(m^2).  Estimated Nutritional Needs: Kcal: 1350-1600 Protein: 65-75 grams Fluid: > 1.5 L/day  Skin: incision   Diet Order: Diet NPO time specified  EDUCATION NEEDS: -No education needs identified at this time   Intake/Output Summary (Last 24 hours) at 09/07/14 1437 Last data filed at 09/07/14 1200  Gross per 24 hour  Intake   1772 ml  Output   1590 ml  Net    182 ml    Last BM: 2/6   Labs:   Recent Labs Lab 09/05/14 0744 09/05/14 0755 09/05/14 1302 09/06/14 0300  NA 129* 130* 130* 137  K 4.4 4.5 3.8 3.5  CL 95* 97  --  106  CO2 22  --   --  24  BUN 27* 32*  --  20  CREATININE 1.02 0.80  --  0.84  CALCIUM 9.0  --   --  8.2*  GLUCOSE 518* 510*  --  129*    CBG (last 3)   Recent Labs  09/06/14 1958 09/06/14 2308 09/07/14 0356  GLUCAP 55* 142* 134*    Scheduled Meds: . aspirin EC  81 mg Oral Daily  . brimonidine  1 drop Both Eyes BID  . calcium-vitamin D  1 tablet Oral Q breakfast  . insulin aspart  0-15 Units Subcutaneous 6 times per day  . insulin glargine  20 Units Subcutaneous Daily  . metoCLOPramide  5 mg Oral TID AC & HS  . multivitamin with minerals  1 tablet Oral Daily    Continuous Infusions: . 0.9 % NaCl with KCl 20 mEq / L 75 mL/hr at 09/07/14 0800    Past Medical History  Diagnosis Date  . Transient  ischemic attack   . HTN (hypertension)   . Hyperlipidemia   . DM (diabetes mellitus)   . GERD (gastroesophageal reflux disease)   . Heart disease     PFO  . Amnesia 01/10/2013  . Patent foramen ovale with atrial septal aneurysm   . Complication of anesthesia   . PONV (postoperative nausea and vomiting)   . Family history of anesthesia complication     "My brother has trouble waking up."  . Glaucoma     Hx: of  . Early cataracts, bilateral     Hx: of  . Arthritis     Past Surgical History  Procedure Laterality Date  . US echocardiography  10-13-11    EF 60-65% normal  . Back surgery      disc   . Tonsillectomy    . Tee without cardioversion  10/30/2011    Procedure: TRANSESOPHAGEAL ECHOCARDIOGRAM (TEE);  Surgeon: Larey Dresser, MD;  Location: [facility];  Service: Cardiovascular;  Laterality: N/A;  . Colonoscopy w/ biopsies and polypectomy      HX;of  . Tonsillectomy    . Tubal ligation    . Lumbar laminectomy/decompression microdiscectomy N/A 09/11/2013    Procedure: LUMBAR LAMINECTOMY/DECOMPRESSION MICRODISCECTOMY 2 LEVELS;  Surgeon: Ophelia Charter, MD;  Location: [facility];  Service: Neurosurgery;  Laterality: N/A;  Lumbar Four-Five Laminectomy and Foraminotomy with redo Lumbar Five-Sacral One diskectomy  . Radiology with anesthesia N/A 09/05/2014    Procedure: RADIOLOGY WITH ANESTHESIA;  Surgeon: Rob Hickman, MD;  Location: [facility];  Service: Radiology;  Laterality: N/A;    Maylon Peppers RD, [place], [place] Pager [phone] After Hours Pager

## 2014-09-07 NOTE — Progress Notes (Addendum)
STROKE TEAM PROGRESS NOTE   HISTORY Christy Simmons is a 80 y.o. female with a past medical history significant for HTN, hyperlipidemia, DM, TIA, PFO with atrial septal aneurysm, brought in via EMS as a code stroke due to acute onset of left hemiparesis, decreased responsiveness. Patient was last known well around 8 pm last night. Husband stated that she hasn't been feeling well in the last month, and this morning he found her in the bathroom around 6 or 6:30 unable to move the left side and not fully responsive. He thinks that she got up to to the bathroom probably around 4 or 4:30 am. Upon arrival to the ED she was able to answer simple questions, and had NIHSS 16. CT brain showed an area of well defined area of hypodensity around the right basal ganglia.  Date last known well: 09/04/14 Time last known well: 8 pm tPA Given: no, out of the window for IV tpa, but patient taken to the IR suite for endovascular treatment. NIHSS: 16    SUBJECTIVE (INTERVAL HISTORY) Patient extubated yesterday and doing well. She still has left hemiparesis. Blood pressure has been adequately controlled. MRI scan shows patchy right MCA branch infarcts with small hemorrhagic transformation in the right basal ganglia but no significant midline shift..  She had 2 prior episodes of transient confusion and memory loss in 2014 and saw Dr. Jannifer Franklin and these were felt to be transient global amnesia and neurovascular workup at that time was negative. She has known history of atrial septal aneurysm but atrial fibrillation has not been noted   OBJECTIVE Temp:  [97.8 F (36.6 C)-100 F (37.8 C)] 98 F (36.7 C) (02/08 0800) Pulse Rate:  [65-85] 76 (02/08 0900) Cardiac Rhythm:  [-] Normal sinus rhythm (02/08 0800) Resp:  [12-20] 12 (02/08 0900) BP: (99-144)/(50-73) 119/55 mmHg (02/08 0800) SpO2:  [91 %-100 %] 100 % (02/08 0900) FiO2 (%):  [40 %] 40 % (02/07 1120)   Recent Labs Lab 09/06/14 1108 09/06/14 1553  09/06/14 1958 09/06/14 2308 09/07/14 0356  GLUCAP 111* 123* 55* 142* 134*    Recent Labs Lab 09/05/14 0744 09/05/14 0755 09/05/14 1302 09/06/14 0300  NA 129* 130* 130* 137  K 4.4 4.5 3.8 3.5  CL 95* 97  --  106  CO2 22  --   --  24  GLUCOSE 518* 510*  --  129*  BUN 27* 32*  --  20  CREATININE 1.02 0.80  --  0.84  CALCIUM 9.0  --   --  8.2*    Recent Labs Lab 09/05/14 0744  AST 30  ALT 23  ALKPHOS 71  BILITOT 0.5  PROT 6.3  ALBUMIN 3.6    Recent Labs Lab 09/05/14 0744 09/05/14 0755 09/05/14 1302 09/06/14 0300  WBC 23.6*  --   --  14.6*  NEUTROABS 19.4*  --   --  10.5*  HGB 13.6 15.3* 12.2 10.4*  HCT 38.8 45.0 36.0 30.0*  MCV 84.0  --   --  83.1  PLT 154  --   --  130*    Recent Labs Lab 09/05/14 0749 09/05/14 1508 09/05/14 2055 09/06/14 0300  CKTOTAL 89  --   --   --   TROPONINI 0.18* 1.15* 1.34* 1.50*    Recent Labs  09/05/14 0744  LABPROT 14.0  INR 1.07    Recent Labs  09/05/14 0820  COLORURINE YELLOW  LABSPEC 1.023  PHURINE 6.0  GLUCOSEU >1000*  HGBUR NEGATIVE  BILIRUBINUR NEGATIVE  KETONESUR 15*  PROTEINUR NEGATIVE  UROBILINOGEN 0.2  NITRITE NEGATIVE  LEUKOCYTESUR NEGATIVE       Component Value Date/Time   CHOL 180 09/06/2014 0300   TRIG 135 09/06/2014 0300   HDL 52 09/06/2014 0300   CHOLHDL 3.5 09/06/2014 0300   VLDL 27 09/06/2014 0300   LDLCALC 101* 09/06/2014 0300   No results found for: HGBA1C    Component Value Date/Time   LABOPIA NONE DETECTED 09/05/2014 0820   COCAINSCRNUR NONE DETECTED 09/05/2014 0820   LABBENZ NONE DETECTED 09/05/2014 0820   AMPHETMU NONE DETECTED 09/05/2014 0820   THCU NONE DETECTED 09/05/2014 0820   LABBARB NONE DETECTED 09/05/2014 0820     Recent Labs Lab 09/05/14 0744  ETH <5    Ct Angio Head and Neck W/cm &/or Wo Cm 09/05/2014    1. Distal right M1 occlusion.  2. Abnormal perfusion throughout essentially the entirety of the right MCA territory. Much of this appears to represent  oligemic brain with relatively preserved cerebral blood volume, however there is likely a developing region of core infarct involving the deep white matter.      Ct Head Wo Contrast 09/05/2014    Sequelae of interval cerebral angiography and right MCA revascularization. No acute hemorrhage.        Ct Head and cervical spine Wo Contrast 09/05/2014    1. Interval development of ill-defined hypo densities within the bilateral centrum semiovale, right greater than left, favored to represent progressive age-indeterminate though at least subacute microvascular ischemic disease continued since remote examination performed 11/2012. Otherwise, no definite acute intracranial process.  2. No fracture or static subluxation of the cervical spine.  3. Moderate to severe multilevel cervical spine DDD, worse at C4-C5, C5-C6 and C6-C7.  4. Asymmetric enlargement of the right lobe of the thyroid and thyroid isthmus with suspected thyroid nodules as detailed above. While these nodules appear grossly similar to compared to the 07/2010 thyroid ultrasound, further evaluation could be performed with repeat dedicated non emergent thyroid ultrasound as clinically indicated.     Dg Pelvis Portable 09/05/2014    No acute fracture or dislocation.  Note that the lateral aspect of the trochanteric region of the right femur is not completely imaged. Recommend an additional cone-down view of the right hip for complete evaluation.      Dg Hip Unilat With Pelvis 2-3 Views Right 09/05/2014    No evidence of acute fracture or malalignment        Ct Cerebral Perfusion W/cm 09/05/2014    1. Distal right M1 occlusion.  2. Abnormal perfusion throughout essentially the entirety of the right MCA territory. Much of this appears to represent oligemic brain with relatively preserved cerebral blood volume, however there is likely a developing region of core infarct involving the deep white matter.     Dg Chest Port 1 View 09/05/2014     No acute cardiopulmonary disease.      Dg Chest Port 1v Same Day 09/05/2014    No acute findings.     Interventional Radiology Procedure 09/05/2014 S/P rt vert artery angiogram and RT common carotid areriogram,followed by complete revascularization of occluded RT MCA prox with x1 pass with the Solitaire FR 8mm x 40 mm retrieval device and 5 mg of superselective INTEGRELIN. TICI 3 flow restored    PHYSICAL EXAM Frail elderly Caucasian lady.  . Afebrile. Head is nontraumatic. Neck is supple without bruit.    Cardiac exam no murmur or gallop. Lungs are clear to auscultation. Distal  pulses are well felt. Neurological Exam : Patient is is awake and alert. Speech is slow and hesitant but clearly without dysarthria or aphasia. She follows midline commands and simple one-step commands in all 4 extremities. Slight right gaze preference but able to look to the left past midline. Pupils equal reactive. Fundi were not visualized. Left lower facial weakness. Tongue midline. Left hemiparesis 3/5 strength. With distal greater than proximal weakness Right lower extremity exam limited due to hip pain Right plantar is equivocal left upgoing. Sensation and coordination mildly impaired on the left . Gait deferred     ASSESSMENT/PLAN Ms. UNITA DETAMORE is a 80 y.o. female with history of HTN, hyperlipidemia, DM, TIA, PFO with atrial septal aneurysm, presenting with left hemiparesis, decreased responsiveness. She did not receive IV t-PA - out of the window for IV tpa, but patient taken to the IR suite for endovascular treatment..   Stroke:  Non-dominant. Right hemisphere from right MCA occlusion status post revascularization with 5 mg intra-arterial Integrilin and solitaire device  Resultant  left hemiparesis. MRI  Multiple areas of acute infarct are present indicative of cerebral Emboli. re- canalization of the right MCA with a small amount of hemorrhage in the right putamen. There are scattered areas of infarct  involving the right insula and right parietal lobe however there is no lobar right MCA infarct.  Carotid Doppler - See CT angiogram of the neck.  2D Echo pending  LDL 101  HgbA1c pending  SCDs for VTE prophylaxis  Diet NPO time specified no liquids  aspirin 81 mg orally every day prior to admission, she did not receive antithrombotic S/P ReoPro and Integrilin by end of hospital D#2 given hemorrhage in the putamen on MRI  Ongoing aggressive stroke risk factor management  Therapy recommendations:  CLR  Disposition:  CLR  Hypertension  Home meds: Lisinopril-hydrochlorothiazide  BP low at times.   Hyperlipidemia  Home meds: No lipid lowering medications prior to admission.  LDL 101, goal < 70  Add statin once swallowing has been cleared.  Continue statin at discharge  Diabetes  HgbA1c pending, goal < 7.0  Uncontrolled  Other Stroke Risk Factors  Advanced age  Hx TIA  PFO- check LE venous dopplers for DVT   Other Active Problems  Anemia  Leukocytosis  Intubated  Elevated troponin levels  Other Pertinent History    Hospital day # 2   Mobilize out of bed. Physical therapy occupational therapy and speech therapy consults. Rehabilitation consult. Strict control of blood pressure. Complete stroke workup.Check LE venous dopplers if negative will need TEE and loop recorder tomorrow. Transfer to stroke unit floor today  Long discussion with daughter at the bedside regards to her stroke, risk for recurrence, stroke evaluation, treatment and answered questions.  This patient is critically ill and at significant risk of neurological worsening, death and care requires constant monitoring of vital signs, hemodynamics,respiratory and cardiac monitoring,review of multiple databases, neurological assessment, discussion with family, other specialists and medical decision making of high complexity.I have made any additions or clarifications directly to the above  note.  I spent 30 minutes of neurocritical care time  in the care of  this patient.  Antony Contras, MD Medical Director [facility] Pager: [phone] 09/07/2014 9:41 AM     To contact Stroke Continuity provider, please refer to [email or web address] After hours, contact General Neurology

## 2014-09-07 NOTE — Evaluation (Signed)
Physical Therapy Evaluation Patient Details Name: Christy Simmons MRN: [id number] DOB: 06/16/1935 Today's Date: 09/07/2014   History of Present Illness  80 y.o. female with history of HTN, hyperlipidemia, DM, TIA, PFO with atrial septal aneurysm, presenting with left hemiparesis, decreased responsiveness. pt s/p right hemisphere from right MCA occlusion status post revascularization.  Clinical Impression  Patient demonstrates deficits in functional mobility as indicated below. Will need continued skilled PT to address deficits and maximize function. Will see as indicated and progress as tolerated. At this time, feel it is imperative for patient to continue with intense comprehensive therapies post acute care to maximize functional recovery. OF NOTE: BP 119/55 and SpO2 supine 87% on 4 liters, increased to 5 liters and positioned OOB in chair Spo2 to 98% on 5 liters.   Follow Up Recommendations CIR;Supervision/Assistance - 24 hour    Equipment Recommendations  Other (comment) (tbd)    Recommendations for Other Services Rehab consult     Precautions / Restrictions Precautions Precautions: Fall Restrictions Weight Bearing Restrictions: No      Mobility  Bed Mobility Overal bed mobility: Needs Assistance Bed Mobility: Supine to Sit     Supine to sit: Max assist     General bed mobility comments: patient required assist for BLE movement, assist for posterior trunk support and elevation to upright.  Transfers Overall transfer level: Needs assistance Equipment used: 2 person hand held assist Transfers: Sit to/from [facility] Sit to Stand: Mod assist;+2 physical assistance Stand pivot transfers: Mod assist;+2 physical assistance       General transfer comment: Assist for elevation to upright, Bilateral LE knee blocking and assist to initiate pivotal steps to chair. Increased time to perform.  Ambulation/Gait                Stairs             Wheelchair Mobility    Modified Rankin (Stroke Patients Only) Modified Rankin (Stroke Patients Only) Pre-Morbid Rankin Score: No symptoms Modified Rankin: Severe disability     Balance Overall balance assessment: Needs assistance;History of Falls Sitting-balance support: Feet supported Sitting balance-Leahy Scale: Poor Sitting balance - Comments: patient requries increased cues and support for upright posture Postural control: Posterior lean Standing balance support: During functional activity Standing balance-Leahy Scale: Zero                               Pertinent Vitals/Pain Pain Assessment: 0-10 Pain Score: 6  Pain Location: right hip and sheath insertion site Pain Descriptors / Indicators: Aching;Discomfort;Grimacing Pain Intervention(s): Limited activity within patient's tolerance;Monitored during session;Premedicated before session;Repositioned    Home Living Family/patient expects to be discharged to:: Private residence Living Arrangements: Spouse/significant other   Type of Home: House Home Access: Stairs to enter Entrance Stairs-Rails: None Entrance Stairs-Number of Steps: 2 Home Layout: One level Home Equipment: Environmental consultant - 2 wheels;Cane - single point;Bedside commode Additional Comments: walk in shower and tub shower    Prior Function Level of Independence: Independent               Hand Dominance   Dominant Hand: Right    Extremity/Trunk Assessment   Upper Extremity Assessment: Defer to OT evaluation           Lower Extremity Assessment: RLE deficits/detail;LLE deficits/detail RLE Deficits / Details: weakness bilaterally less then 3 gross movements (limited by pain) LLE Deficits / Details: weakness bilaterally less then 3 gross movements, 2/5 (  left sided weakness > right)     Communication      Cognition Arousal/Alertness: Lethargic Behavior During Therapy: Flat affect Overall Cognitive Status: Impaired/Different from  baseline Area of Impairment: Memory     Memory: Decreased short-term memory (no recall of events)              General Comments      Exercises        Assessment/Plan    PT Assessment Patient needs continued PT services  PT Diagnosis Difficulty walking;Abnormality of gait;Hemiplegia non-dominant side   PT Problem List Decreased strength;Decreased range of motion;Decreased activity tolerance;Decreased balance;Decreased mobility;Decreased coordination;Cardiopulmonary status limiting activity;Pain  PT Treatment Interventions DME instruction;Gait training;Stair training;Functional mobility training;Therapeutic activities;Therapeutic exercise;Balance training;Patient/family education   PT Goals (Current goals can be found in the Care Plan section) Acute Rehab PT Goals Patient Stated Goal: to go home PT Goal Formulation: With patient/family Time For Goal Achievement: 09/21/14 Potential to Achieve Goals: Good    Frequency Min 3X/week   Barriers to discharge        Co-evaluation               End of Session Equipment Utilized During Treatment: Gait belt;Oxygen Activity Tolerance: Patient limited by lethargy Patient left: in chair;with call bell/phone within reach;with family/visitor present (nsg and MD in room for rounding) Nurse Communication: Mobility status         Time: 9150-5697 PT Time Calculation (min) (ACUTE ONLY): 29 min   Charges:   PT Evaluation $Initial PT Evaluation Tier I: 1 Procedure     PT G CodesDuncan Dull 2014-09-20, 10:04 AM [name], PT DPT  [phone]

## 2014-09-07 NOTE — Progress Notes (Signed)
VASCULAR LAB PRELIMINARY  PRELIMINARY  PRELIMINARY  PRELIMINARY  Bilateral lower extremity venous duplex  completed.    Preliminary report:  Right:  Non occlusive DVT noted in the popliteal vein.  No evidence of superficial thrombosis.  No Baker's cyst.  Left: DVT noted in the PTV.  No evidence of superficial thrombosis.  Baker's cyst in left popliteal fossa.   Analee Montee, RVT 09/07/2014, 11:17 AM

## 2014-09-07 NOTE — Evaluation (Signed)
Clinical/Bedside Swallow Evaluation Patient Details  Name: Christy Simmons MRN: [id number] Date of Birth: May 31, 1935  Today's Date: 09/07/2014 Time: SLP Start Time (ACUTE ONLY): 6301 SLP Stop Time (ACUTE ONLY): 1433 SLP Time Calculation (min) (ACUTE ONLY): 13 min  Past Medical History:  Past Medical History  Diagnosis Date  . Transient ischemic attack   . HTN (hypertension)   . Hyperlipidemia   . DM (diabetes mellitus)   . GERD (gastroesophageal reflux disease)   . Heart disease     PFO  . Amnesia 01/10/2013  . Patent foramen ovale with atrial septal aneurysm   . Complication of anesthesia   . PONV (postoperative nausea and vomiting)   . Family history of anesthesia complication     "My brother has trouble waking up."  . Glaucoma     Hx: of  . Early cataracts, bilateral     Hx: of  . Arthritis    Past Surgical History:  Past Surgical History  Procedure Laterality Date  . US echocardiography  10-13-11    EF 60-65% normal  . Back surgery      disc   . Tonsillectomy    . Tee without cardioversion  10/30/2011    Procedure: TRANSESOPHAGEAL ECHOCARDIOGRAM (TEE);  Surgeon: Larey Dresser, MD;  Location: [facility];  Service: Cardiovascular;  Laterality: N/A;  . Colonoscopy w/ biopsies and polypectomy      HX;of  . Tonsillectomy    . Tubal ligation    . Lumbar laminectomy/decompression microdiscectomy N/A 09/11/2013    Procedure: LUMBAR LAMINECTOMY/DECOMPRESSION MICRODISCECTOMY 2 LEVELS;  Surgeon: Ophelia Charter, MD;  Location: [facility];  Service: Neurosurgery;  Laterality: N/A;  Lumbar Four-Five Laminectomy and Foraminotomy with redo Lumbar Five-Sacral One diskectomy  . Radiology with anesthesia N/A 09/05/2014    Procedure: RADIOLOGY WITH ANESTHESIA;  Surgeon: Rob Hickman, MD;  Location: [facility];  Service: Radiology;  Laterality: N/A;   HPI:  80 y.o. female with a past medical history significant for HTN, hyperlipidemia, DM, TIA, PFO with atrial septal aneurysm admitted  with left hemiparesis and decreased responsiveness. MRI showed Multiple areas of acute infarct are present indicative of cerebral emboli, right MCA branch infarcts including the right insula and basal ganglia with small hemorrhagic transformation in the right basal ganglis. In addition, infarcts in the left parietal lobe, bilateral PICA and left occipital lobe.  Underwent cerebral angiogram with complete revascularization of occluded RT MCA 2/6. Intubated 2/6-2/7.  Pt currently NPO with NG tube.     Assessment / Plan / Recommendation Clinical Impression  Pt presents with oral/pharyngeal dysphagia characterized by delayed oral transit, delayed swallow trigger and decreased hyo-laryngeal excursion.  Pt also presented with double swallow to clear thin and puree boluses.  Increased wheezing and delayed throat clear and sneeze were noted with intake concerning for aspiration.  Rx keep the pt NPO pending results of MBS tomorrow.      Aspiration Risk  Moderate    Diet Recommendation NPO   Medication Administration: Via alternative means    Other  Recommendations Recommended Consults: MBS Oral Care Recommendations: Oral care Q4 per protocol   Follow Up Recommendations       Frequency and Duration min 2x/week  2 weeks   Pertinent Vitals/Pain Pt complained of a sore throat.      SLP Swallow Goals     Swallow Study Prior Functional Status  Type of Home: House    General Date of Onset: 09/05/14 HPI: 80 y.o. female with a  past medical history significant for HTN, hyperlipidemia, DM, TIA, PFO with atrial septal aneurysm admitted with left hemiparesis and decreased responsiveness. MRI showed Multiple areas of acute infarct are present indicative of cerebral emboli, right MCA branch infarcts including the right insula and basal ganglia with small hemorrhagic transformation in the right basal ganglis. In addition, infarcts in the left parietal lobe, bilateral PICA and left occipital lobe.  Underwent  cerebral angiogram with complete revascularization of occluded RT MCA 2/6. Intubated 2/6-2/7.  Pt currently NPO with NG tube.   Type of Study: Bedside swallow evaluation Diet Prior to this Study: NPO Respiratory Status: Nasal cannula History of Recent Intubation: Yes Length of Intubations (days): 1 days Date extubated: 09/06/14 Behavior/Cognition: Alert;Cooperative;Pleasant mood Oral Cavity - Dentition: Adequate natural dentition Self-Feeding Abilities: Needs assist Patient Positioning: Upright in bed Baseline Vocal Quality: Clear Volitional Cough: Weak Volitional Swallow: Able to elicit    Oral/Motor/Sensory Function Overall Oral Motor/Sensory Function: Impaired Labial ROM: Reduced left Labial Symmetry: Abnormal symmetry left Labial Strength: Reduced Lingual ROM: Reduced left Lingual Symmetry: Abnormal symmetry left Lingual Strength: Reduced (most notable on the left) Facial ROM: Reduced left Facial Symmetry: Left drooping eyelid;Left droop Facial Strength: Reduced   Ice Chips Ice chips: Impaired Presentation: Spoon Pharyngeal Phase Impairments: Suspected delayed Swallow;Decreased hyoid-laryngeal movement (increased wheezing after swallow)   Thin Liquid Thin Liquid: Impaired Presentation: Spoon Pharyngeal  Phase Impairments: Suspected delayed Swallow;Decreased hyoid-laryngeal movement;Multiple swallows (increased wheezing with intake)    Nectar Thick Nectar Thick Liquid: Not tested   Honey Thick Honey Thick Liquid: Not tested   Puree Puree: Impaired Presentation: Spoon Oral Phase Impairments: Reduced lingual movement/coordination Oral Phase Functional Implications: Prolonged oral transit Pharyngeal Phase Impairments: Suspected delayed Swallow;Decreased hyoid-laryngeal movement;Multiple swallows (increased wheezing with intake)   Solid   GO    Solid: Not tested      Shelly Flatten, MA, CCC-SLP Acute Rehab SLP [phone]  Lamar Sprinkles 09/07/2014,2:56  PM

## 2014-09-07 NOTE — Progress Notes (Signed)
Referring Physician(s): Sethi  Subjective:  CVA R MCA clot retrieval and IA Integrilin 2/6 Revascularization R MCA  Allergies: Gabapentin and Codeine  Medications: Prior to Admission medications   Medication Sig Start Date End Date Taking? Authorizing Provider  aspirin EC 81 MG tablet Take 81 mg by mouth daily.   Yes Historical Provider, MD  brimonidine (ALPHAGAN) 0.2 % ophthalmic solution Place 1 drop into both eyes 2 (two) times daily.  11/18/12  Yes Historical Provider, MD  Calcium Citrate-Vitamin D 250-200 MG-UNIT TABS Take 1 tablet by mouth daily.   Yes Historical Provider, MD  Cholecalciferol (VITAMIN D3) 1000 UNITS CAPS Take 1,000 Units by mouth daily.   Yes Historical Provider, MD  esomeprazole (NEXIUM) 20 MG capsule Take 20 mg by mouth daily.    Yes Historical Provider, MD  ibuprofen (ADVIL,MOTRIN) 200 MG tablet Take 200 mg by mouth daily as needed for mild pain.    Yes Historical Provider, MD  insulin glargine (LANTUS) 100 UNIT/ML injection Inject 20 Units into the skin daily.    Yes Historical Provider, MD  insulin lispro (HUMALOG) 100 UNIT/ML injection Inject into the skin 3 (three) times daily before meals. Per sliding scale - average 15 units   Yes Historical Provider, MD  lisinopril-hydrochlorothiazide (PRINZIDE,ZESTORETIC) 20-25 MG per tablet Take 1 tablet by mouth daily.   Yes Historical Provider, MD  Magnesium 200 MG TABS Take 400 mg by mouth daily.   Yes Historical Provider, MD  metoCLOPramide (REGLAN) 5 MG tablet Take 5 mg by mouth 4 (four) times daily -  before meals and at bedtime.    Yes Historical Provider, MD  Misc Natural Products (OSTEO BI-FLEX ADV JOINT SHIELD) TABS Take 1 tablet by mouth daily.   Yes Historical Provider, MD  Multiple Vitamin (MULITIVITAMIN WITH MINERALS) TABS Take 1 tablet by mouth daily.   Yes Historical Provider, MD  traMADol (ULTRAM) 50 MG tablet Take 50-100 mg by mouth 2 (two) times daily as needed for moderate pain.  11/20/13  Yes  Historical Provider, MD  diazepam (VALIUM) 5 MG tablet Take 1 tablet (5 mg total) by mouth every 6 (six) hours as needed for muscle spasms. Patient not taking: Reported on 09/05/2014 09/13/13   Consuella Lose, MD  oxyCODONE-acetaminophen (PERCOCET/ROXICET) 5-325 MG per tablet Take 1-2 tablets by mouth every 4 (four) hours as needed for moderate pain. Patient not taking: Reported on 09/05/2014 09/13/13   Consuella Lose, MD    Review of Systems  Vital Signs: BP 119/55 mmHg  Pulse 74  Temp(Src) 98 F (36.7 C) (Oral)  Resp 11  Ht 5\' 4"  (1.626 m)  Wt 55.5 kg (122 lb 5.7 oz)  BMI 20.99 kg/m2  SpO2 100%  Physical Exam  Constitutional: She appears well-developed.  Eyes: EOM are normal.  Musculoskeletal: Normal range of motion.  Moves all 4s = strength and sensation Rt groin sl tender; no bleeding No hematoma Rt foot 2+ pulses  Neurological: She is alert.  Skin: Skin is warm and dry.  Psychiatric:  Pleasant Answers most questions appropriately    Imaging: Ct Angio Head W/cm &/or Wo Cm  09/05/2014   CLINICAL DATA:  Left-sided paralysis.  Acute stroke.  EXAM: CT ANGIOGRAPHY HEAD AND NECK  CT CEREBRAL PERFUSION  TECHNIQUE: Multidetector CT imaging of the head and neck was performed using the standard protocol during bolus administration of intravenous contrast. Multiplanar CT image reconstructions and MIPs were obtained to evaluate the vascular anatomy. Carotid stenosis measurements (when applicable) are obtained utilizing NASCET  criteria, using the distal internal carotid diameter as the denominator. CT cerebral perfusion was performed during bolus IV contrast injection.  CONTRAST:  128mL OMNIPAQUE IOHEXOL 350 MG/ML SOLN  COMPARISON:  Head CT earlier today. Head MRI/ MRA 01/27/2013. Neck MRA 10/13/2011.  FINDINGS: CTA NECK  Aortic arch: 3 vessel aortic arch. The brachiocephalic and subclavian arteries are patent without stenosis.  Right carotid system: Mild, predominantly noncalcified  plaque is present at the right carotid bifurcation. No common carotid or cervical internal carotid artery stenosis.  Left carotid system: Mild calcified and noncalcified plaque at the carotid bifurcation without common carotid or cervical internal carotid artery stenosis.  Vertebral arteries:Vertebral arteries are patent without stenosis. Left vertebral artery is mildly dominant.  Skeleton: Moderate to severe multilevel cervical disc degeneration.  Other neck: The thyroid gland is heterogeneous and diffusely enlarged, more so in the right lobe. Multiple nodules are present. Dominant right-sided nodule measures 3.0 cm. There is a 2.0 cm nodule in the isthmus.  CTA HEAD  Anterior circulation: Internal carotid arteries are patent from skullbase to carotid termini. There is mild carotid siphon calcification bilaterally without significant stenosis. There is a distal M1 occlusion on the right. Contrast is seen within M2 and more distal MCA branch vessels, likely via collateral flow, however the number of MCA branch vessels is mildly diminished compared to the contralateral side. Left MCA and bilateral ACAs are unremarkable. No intracranial aneurysm is identified.  Posterior circulation: The intracranial vertebral arteries are patent to the basilar with the left being dominant. PICA origins and SCA origins are patent. Basilar artery is patent without stenosis. There are patent posterior communicating arteries bilaterally, right larger than left, and the right P1 segment is mildly hypoplastic. PCAs are otherwise unremarkable.  Venous sinuses: Patent.  Anatomic variants: Hypoplastic right P1.  Delayed phase: No abnormal brain parenchymal or meningeal enhancement. Asymmetric enhancement of right MCA branch vessels may reflect slow flow and collateral flow. Subtle asymmetric hypoattenuation in the right corona radiata and external capsule may reflect developing acute infarct.  CT PERFUSION  Cerebral perfusion images  demonstrate abnormally prolonged mean transit time and time to peak throughout essentially the entirety of the right MCA territory with corresponding decreased cerebral blood flow. Cerebral blood volume is maintained throughout much of this region, with some cortical/subcortical regions demonstrating slightly elevated CBV compared to the contralateral side. The deep white matter of the right corona radiata and centrum semiovale, particularly posteriorly, as well as posterior temporal lobe demonstrate more abnormal MTT and CBF parameters than the remainder of the MCA territory as well as slightly diminished CBV compared to the contralateral side and may reflect a developing core infarct in a deep watershed distribution. The basal ganglia are largely spared.  IMPRESSION: 1. Distal right M1 occlusion. 2. Abnormal perfusion throughout essentially the entirety of the right MCA territory. Much of this appears to represent oligemic brain with relatively preserved cerebral blood volume, however there is likely a developing region of core infarct involving the deep white matter.  Preliminary results were discussed in person at the time of interpretation on 09/05/2014 at 10:45 am with Dr. Dorian Pod , who verbally acknowledged these results.   Electronically Signed   By: Logan Bores   On: 09/05/2014 11:35   Ct Head Wo Contrast  09/05/2014   CLINICAL DATA:  Stroke. Left-sided weakness. Right MCA occlusion status post cerebral angiogram and revascularization.  EXAM: CT HEAD WITHOUT CONTRAST  TECHNIQUE: Contiguous axial images were obtained from the base of  the skull through the vertex without intravenous contrast.  COMPARISON:  Head CT earlier today  FINDINGS: There is some residual intravascular contrast present from recent catheter angiogram. There is mild contrast staining in the right basal ganglia. Subtly asymmetric hypoattenuation in the posterior right centrum semiovale and corona radiata is unchanged and could  reflect early acute ischemic change or asymmetric chronic ischemia. Patchy hypodensities more anteriorly in the centrum semiovale/ corona radiata bilaterally are also unchanged and appear more chronic in nature. No definite acute large territory cortical infarct is identified. There is no evidence of acute intracranial hemorrhage, mass, midline shift, or extra-axial fluid collection. Mild generalized cerebral atrophy is noted.  Orbits are unremarkable. Visualized mastoid air cells are clear. Left sphenoid sinus fluid has mildly increased. Endotracheal and enteric tubes are partially visualized.  IMPRESSION: Sequelae of interval cerebral angiography and right MCA revascularization. No acute hemorrhage.   Electronically Signed   By: Logan Bores   On: 09/05/2014 14:41   Ct Head Wo Contrast  09/05/2014   CLINICAL DATA:  Left-sided weakness beginning earlier this morning. Post fall.  EXAM: CT HEAD WITHOUT CONTRAST  CT CERVICAL SPINE WITHOUT CONTRAST  TECHNIQUE: Multidetector CT imaging of the head and cervical spine was performed following the standard protocol without intravenous contrast. Multiplanar CT image reconstructions of the cervical spine were also generated.  COMPARISON:  Head CT - 12/18/2012; brain MRI - 01/27/2013; thyroid ultrasound - 08/17/2010  FINDINGS: CT HEAD FINDINGS  Examination is minimally degraded due to obliquity. Interval development of an ill-defined area of hypoattenuation within the bilateral centrum semiovale, right greater than left (representative images 18 through 20, series 201). These areas appear at least subacute given lack of adjacent vasogenic edema. The gray-white differentiation is otherwise well maintained. Similar findings of atrophy with bifrontal sulcal prominence. Unchanged size and configuration of the ventricles and basilar cisterns given obliquity. No midline shift. Intracranial atherosclerosis.  There is minimal mucosal thickening within the bilateral sphenoid sinuses.  The remaining paranasal sinuses mastoid air cells are normally aerated. No air-fluid levels. Regional soft tissues appear normal. No displaced calvarial fracture.  CT CERVICAL SPINE FINDINGS  C1 to the superior endplate of T3 is imaged.  Normal alignment of the cervical spine given the head being held in a minimal amount of left lateral flexion. No anterolisthesis or retrolisthesis. The bilateral facets are normally aligned. The dens is normally positioned between the lateral masses of C1. Moderate degenerative change of the atlantodental articulation  Cervical vertebral body heights are preserved. Prevertebral soft tissues are normal.  There is moderate to severe multilevel cervical spine DDD, worse at C4-C5, C5-C6 and C6-C7 with disc space height loss, endplate irregularity and posteriorly directed disc osteophyte complexes at these locations.  There is asymmetric enlargement of the right lobe of the thyroid and the thyroid isthmus with note made of an approximately 1.1 cm hyper attenuating nodule within the posterior aspect the right lobe of the thyroid (image 65, series 302) as well as an approximately 2.0 x 1.8 cm nodule within the thyroid isthmus (image 78).  Regional soft tissues appear otherwise normal. Limited visualization of lung apices is normal.  IMPRESSION: 1. Interval development of ill-defined hypo densities within the bilateral centrum semiovale, right greater than left, favored to represent progressive age-indeterminate though at least subacute microvascular ischemic disease continued since remote examination performed 11/2012. Otherwise, no definite acute intracranial process. 2. No fracture or static subluxation of the cervical spine. 3. Moderate to severe multilevel cervical spine DDD, worse  at C4-C5, C5-C6 and C6-C7. 4. Asymmetric enlargement of the right lobe of the thyroid and thyroid isthmus with suspected thyroid nodules as detailed above. While these nodules appear grossly similar to  compared to the 07/2010 thyroid ultrasound, further evaluation could be performed with repeat dedicated non emergent thyroid ultrasound as clinically indicated. Above findings discussed with Dr. Aram Beecham at 02/2012.   Electronically Signed   By: Sandi Mariscal M.D.   On: 09/05/2014 08:39   Ct Angio Neck W/cm &/or Wo/cm  09/05/2014   CLINICAL DATA:  Left-sided paralysis.  Acute stroke.  EXAM: CT ANGIOGRAPHY HEAD AND NECK  CT CEREBRAL PERFUSION  TECHNIQUE: Multidetector CT imaging of the head and neck was performed using the standard protocol during bolus administration of intravenous contrast. Multiplanar CT image reconstructions and MIPs were obtained to evaluate the vascular anatomy. Carotid stenosis measurements (when applicable) are obtained utilizing NASCET criteria, using the distal internal carotid diameter as the denominator. CT cerebral perfusion was performed during bolus IV contrast injection.  CONTRAST:  190mL OMNIPAQUE IOHEXOL 350 MG/ML SOLN  COMPARISON:  Head CT earlier today. Head MRI/ MRA 01/27/2013. Neck MRA 10/13/2011.  FINDINGS: CTA NECK  Aortic arch: 3 vessel aortic arch. The brachiocephalic and subclavian arteries are patent without stenosis.  Right carotid system: Mild, predominantly noncalcified plaque is present at the right carotid bifurcation. No common carotid or cervical internal carotid artery stenosis.  Left carotid system: Mild calcified and noncalcified plaque at the carotid bifurcation without common carotid or cervical internal carotid artery stenosis.  Vertebral arteries:Vertebral arteries are patent without stenosis. Left vertebral artery is mildly dominant.  Skeleton: Moderate to severe multilevel cervical disc degeneration.  Other neck: The thyroid gland is heterogeneous and diffusely enlarged, more so in the right lobe. Multiple nodules are present. Dominant right-sided nodule measures 3.0 cm. There is a 2.0 cm nodule in the isthmus.  CTA HEAD  Anterior circulation: Internal  carotid arteries are patent from skullbase to carotid termini. There is mild carotid siphon calcification bilaterally without significant stenosis. There is a distal M1 occlusion on the right. Contrast is seen within M2 and more distal MCA branch vessels, likely via collateral flow, however the number of MCA branch vessels is mildly diminished compared to the contralateral side. Left MCA and bilateral ACAs are unremarkable. No intracranial aneurysm is identified.  Posterior circulation: The intracranial vertebral arteries are patent to the basilar with the left being dominant. PICA origins and SCA origins are patent. Basilar artery is patent without stenosis. There are patent posterior communicating arteries bilaterally, right larger than left, and the right P1 segment is mildly hypoplastic. PCAs are otherwise unremarkable.  Venous sinuses: Patent.  Anatomic variants: Hypoplastic right P1.  Delayed phase: No abnormal brain parenchymal or meningeal enhancement. Asymmetric enhancement of right MCA branch vessels may reflect slow flow and collateral flow. Subtle asymmetric hypoattenuation in the right corona radiata and external capsule may reflect developing acute infarct.  CT PERFUSION  Cerebral perfusion images demonstrate abnormally prolonged mean transit time and time to peak throughout essentially the entirety of the right MCA territory with corresponding decreased cerebral blood flow. Cerebral blood volume is maintained throughout much of this region, with some cortical/subcortical regions demonstrating slightly elevated CBV compared to the contralateral side. The deep white matter of the right corona radiata and centrum semiovale, particularly posteriorly, as well as posterior temporal lobe demonstrate more abnormal MTT and CBF parameters than the remainder of the MCA territory as well as slightly diminished CBV compared to the  contralateral side and may reflect a developing core infarct in a deep watershed  distribution. The basal ganglia are largely spared.  IMPRESSION: 1. Distal right M1 occlusion. 2. Abnormal perfusion throughout essentially the entirety of the right MCA territory. Much of this appears to represent oligemic brain with relatively preserved cerebral blood volume, however there is likely a developing region of core infarct involving the deep white matter.  Preliminary results were discussed in person at the time of interpretation on 09/05/2014 at 10:45 am with Dr. Dorian Pod , who verbally acknowledged these results.   Electronically Signed   By: Logan Bores   On: 09/05/2014 11:35   Ct Cervical Spine Wo Contrast  09/05/2014   CLINICAL DATA:  Left-sided weakness beginning earlier this morning. Post fall.  EXAM: CT HEAD WITHOUT CONTRAST  CT CERVICAL SPINE WITHOUT CONTRAST  TECHNIQUE: Multidetector CT imaging of the head and cervical spine was performed following the standard protocol without intravenous contrast. Multiplanar CT image reconstructions of the cervical spine were also generated.  COMPARISON:  Head CT - 12/18/2012; brain MRI - 01/27/2013; thyroid ultrasound - 08/17/2010  FINDINGS: CT HEAD FINDINGS  Examination is minimally degraded due to obliquity. Interval development of an ill-defined area of hypoattenuation within the bilateral centrum semiovale, right greater than left (representative images 18 through 20, series 201). These areas appear at least subacute given lack of adjacent vasogenic edema. The gray-white differentiation is otherwise well maintained. Similar findings of atrophy with bifrontal sulcal prominence. Unchanged size and configuration of the ventricles and basilar cisterns given obliquity. No midline shift. Intracranial atherosclerosis.  There is minimal mucosal thickening within the bilateral sphenoid sinuses. The remaining paranasal sinuses mastoid air cells are normally aerated. No air-fluid levels. Regional soft tissues appear normal. No displaced calvarial  fracture.  CT CERVICAL SPINE FINDINGS  C1 to the superior endplate of T3 is imaged.  Normal alignment of the cervical spine given the head being held in a minimal amount of left lateral flexion. No anterolisthesis or retrolisthesis. The bilateral facets are normally aligned. The dens is normally positioned between the lateral masses of C1. Moderate degenerative change of the atlantodental articulation  Cervical vertebral body heights are preserved. Prevertebral soft tissues are normal.  There is moderate to severe multilevel cervical spine DDD, worse at C4-C5, C5-C6 and C6-C7 with disc space height loss, endplate irregularity and posteriorly directed disc osteophyte complexes at these locations.  There is asymmetric enlargement of the right lobe of the thyroid and the thyroid isthmus with note made of an approximately 1.1 cm hyper attenuating nodule within the posterior aspect the right lobe of the thyroid (image 65, series 302) as well as an approximately 2.0 x 1.8 cm nodule within the thyroid isthmus (image 78).  Regional soft tissues appear otherwise normal. Limited visualization of lung apices is normal.  IMPRESSION: 1. Interval development of ill-defined hypo densities within the bilateral centrum semiovale, right greater than left, favored to represent progressive age-indeterminate though at least subacute microvascular ischemic disease continued since remote examination performed 11/2012. Otherwise, no definite acute intracranial process. 2. No fracture or static subluxation of the cervical spine. 3. Moderate to severe multilevel cervical spine DDD, worse at C4-C5, C5-C6 and C6-C7. 4. Asymmetric enlargement of the right lobe of the thyroid and thyroid isthmus with suspected thyroid nodules as detailed above. While these nodules appear grossly similar to compared to the 07/2010 thyroid ultrasound, further evaluation could be performed with repeat dedicated non emergent thyroid ultrasound as clinically  indicated. Above  findings discussed with Dr. Aram Beecham at 02/2012.   Electronically Signed   By: Sandi Mariscal M.D.   On: 09/05/2014 08:39   Mr Brain Wo Contrast  09/06/2014   CLINICAL DATA:  Stroke. Right MCA occlusion with clot removal 09/05/2014.  EXAM: MRI HEAD WITHOUT CONTRAST  TECHNIQUE: Multiplanar, multiecho pulse sequences of the brain and surrounding structures were obtained without intravenous contrast.  COMPARISON:  CT 09/05/2014  FINDINGS: Multiple areas of acute infarct are identified. Scattered areas of acute infarct in the right MCA territory involving the right insula, right posterior basal ganglia, and right parietal lobe. Mild amount of hemorrhage in the right posterior putamen.  Small areas of acute infarct in the left parietal cortex. Acute infarct in the PICA territory bilaterally. Small area of acute infarct in the left occipital lobe.  This pattern suggests cerebral emboli.  Ventricle size is normal. No shift of the midline structures. Negative for mass lesion.  Air-fluid level in the sphenoid sinus. Maxillary sinuses are clear bilaterally.  IMPRESSION: Multiple areas of acute infarct are present indicative of cerebral emboli.  Postop re- canalization of the right MCA with a small amount of hemorrhage in the right putamen. There are scattered areas of infarct involving the right insula and right parietal lobe however there is no lobar right MCA infarct.   Electronically Signed   By: Franchot Gallo M.D.   On: 09/06/2014 18:33   Dg Pelvis Portable  09/05/2014   CLINICAL DATA:  Fall with right hip pain.  EXAM: PORTABLE PELVIS 1-2 VIEWS  COMPARISON:  12/24/2012  FINDINGS: Note that the very lateral aspect of the right trochanteric region is not imaged. There are mild symmetric degenerative changes of the hips unchanged. No evidence of acute fracture or dislocation. There are degenerative changes of the spine. There is mild fecal retention over the rectum.  IMPRESSION: No acute fracture or  dislocation. Note that the lateral aspect of the trochanteric region of the right femur is not completely imaged. Recommend an additional cone-down view of the right hip for complete evaluation.   Electronically Signed   By: Marin Olp M.D.   On: 09/05/2014 10:57   Ct Cerebral Perfusion W/cm  09/05/2014   CLINICAL DATA:  Left-sided paralysis.  Acute stroke.  EXAM: CT ANGIOGRAPHY HEAD AND NECK  CT CEREBRAL PERFUSION  TECHNIQUE: Multidetector CT imaging of the head and neck was performed using the standard protocol during bolus administration of intravenous contrast. Multiplanar CT image reconstructions and MIPs were obtained to evaluate the vascular anatomy. Carotid stenosis measurements (when applicable) are obtained utilizing NASCET criteria, using the distal internal carotid diameter as the denominator. CT cerebral perfusion was performed during bolus IV contrast injection.  CONTRAST:  131mL OMNIPAQUE IOHEXOL 350 MG/ML SOLN  COMPARISON:  Head CT earlier today. Head MRI/ MRA 01/27/2013. Neck MRA 10/13/2011.  FINDINGS: CTA NECK  Aortic arch: 3 vessel aortic arch. The brachiocephalic and subclavian arteries are patent without stenosis.  Right carotid system: Mild, predominantly noncalcified plaque is present at the right carotid bifurcation. No common carotid or cervical internal carotid artery stenosis.  Left carotid system: Mild calcified and noncalcified plaque at the carotid bifurcation without common carotid or cervical internal carotid artery stenosis.  Vertebral arteries:Vertebral arteries are patent without stenosis. Left vertebral artery is mildly dominant.  Skeleton: Moderate to severe multilevel cervical disc degeneration.  Other neck: The thyroid gland is heterogeneous and diffusely enlarged, more so in the right lobe. Multiple nodules are present. Dominant right-sided nodule measures  3.0 cm. There is a 2.0 cm nodule in the isthmus.  CTA HEAD  Anterior circulation: Internal carotid arteries are  patent from skullbase to carotid termini. There is mild carotid siphon calcification bilaterally without significant stenosis. There is a distal M1 occlusion on the right. Contrast is seen within M2 and more distal MCA branch vessels, likely via collateral flow, however the number of MCA branch vessels is mildly diminished compared to the contralateral side. Left MCA and bilateral ACAs are unremarkable. No intracranial aneurysm is identified.  Posterior circulation: The intracranial vertebral arteries are patent to the basilar with the left being dominant. PICA origins and SCA origins are patent. Basilar artery is patent without stenosis. There are patent posterior communicating arteries bilaterally, right larger than left, and the right P1 segment is mildly hypoplastic. PCAs are otherwise unremarkable.  Venous sinuses: Patent.  Anatomic variants: Hypoplastic right P1.  Delayed phase: No abnormal brain parenchymal or meningeal enhancement. Asymmetric enhancement of right MCA branch vessels may reflect slow flow and collateral flow. Subtle asymmetric hypoattenuation in the right corona radiata and external capsule may reflect developing acute infarct.  CT PERFUSION  Cerebral perfusion images demonstrate abnormally prolonged mean transit time and time to peak throughout essentially the entirety of the right MCA territory with corresponding decreased cerebral blood flow. Cerebral blood volume is maintained throughout much of this region, with some cortical/subcortical regions demonstrating slightly elevated CBV compared to the contralateral side. The deep white matter of the right corona radiata and centrum semiovale, particularly posteriorly, as well as posterior temporal lobe demonstrate more abnormal MTT and CBF parameters than the remainder of the MCA territory as well as slightly diminished CBV compared to the contralateral side and may reflect a developing core infarct in a deep watershed distribution. The basal  ganglia are largely spared.  IMPRESSION: 1. Distal right M1 occlusion. 2. Abnormal perfusion throughout essentially the entirety of the right MCA territory. Much of this appears to represent oligemic brain with relatively preserved cerebral blood volume, however there is likely a developing region of core infarct involving the deep white matter.  Preliminary results were discussed in person at the time of interpretation on 09/05/2014 at 10:45 am with Dr. Dorian Pod , who verbally acknowledged these results.   Electronically Signed   By: Logan Bores   On: 09/05/2014 11:35   Dg Chest Port 1 View  09/05/2014   CLINICAL DATA:  Code stroke. History of TIA, hypertension and diabetes.  EXAM: PORTABLE CHEST - 1 VIEW  COMPARISON:  08/14/2013; 10/13/2011  FINDINGS: Grossly unchanged cardiac silhouette and mediastinal contours. No focal parenchymal opacities. No pleural effusion or pneumothorax. No evidence of edema. No acute osseus abnormalities.  IMPRESSION: No acute cardiopulmonary disease.   Electronically Signed   By: Sandi Mariscal M.D.   On: 09/05/2014 08:53   Dg Chest Port 1v Same Day  09/05/2014   CLINICAL DATA:  Respiratory failure.  EXAM: PORTABLE CHEST - 1 VIEW SAME DAY  COMPARISON:  09/05/2014 at 0838 hr.  FINDINGS: Endotracheal tube is in satisfactory position, terminating 5.1 cm above the carina. Nasogastric tube is followed into the stomach. Lungs are clear. A skin fold projects over the upper left hemi thorax. No pleural fluid.  IMPRESSION: No acute findings.   Electronically Signed   By: Lorin Picket M.D.   On: 09/05/2014 19:00   Dg Hip Unilat With Pelvis 2-3 Views Right  09/05/2014   CLINICAL DATA:  80 year old female with right hip pain. Patient fell in  bathroom earlier today.  EXAM: RIGHT HIP (WITH PELVIS) 2-3 VIEWS  COMPARISON:  None.  FINDINGS: No evidence of acute fracture or malalignment. The femoral head is located. The bones appear osteopenic. Foley catheter present within the bladder.   IMPRESSION: No evidence of acute fracture or malalignment   Electronically Signed   By: Jacqulynn Cadet M.D.   On: 09/05/2014 12:05    Labs:  CBC:  Recent Labs  09/08/13 0828 09/05/14 0744 09/05/14 0755 09/05/14 1302 09/06/14 0300  WBC 12.9* 23.6*  --   --  14.6*  HGB 11.9* 13.6 15.3* 12.2 10.4*  HCT 36.3 38.8 45.0 36.0 30.0*  PLT 229 154  --   --  130*    COAGS:  Recent Labs  09/05/14 0744  INR 1.07  APTT 28    BMP:  Recent Labs  09/08/13 0828 09/05/14 0744 09/05/14 0755 09/05/14 1302 09/06/14 0300  NA 139 129* 130* 130* 137  K 4.2 4.4 4.5 3.8 3.5  CL 100 95* 97  --  106  CO2 29 22  --   --  24  GLUCOSE 222* 518* 510*  --  129*  BUN 19 27* 32*  --  20  CALCIUM 9.7 9.0  --   --  8.2*  CREATININE 0.98 1.02 0.80  --  0.84  GFRNONAA 54* 52*  --   --  65*  GFRAA 63* 60*  --   --  76*    LIVER FUNCTION TESTS:  Recent Labs  09/05/14 0744  BILITOT 0.5  AST 30  ALT 23  ALKPHOS 71  PROT 6.3  ALBUMIN 3.6    Assessment and Plan:  CVA R MCA revascularization 2/6 Clot retrieval/Integrilin Doing well    I spent a total of 15 minutes face to face in clinical consultation/evaluation, greater than 50% of which was counseling/coordinating care for R MCA revascularization  Signed: Gerda Yin A 09/07/2014, 11:02 AM

## 2014-09-07 NOTE — Progress Notes (Signed)
PCCM NOTE  SUBJ: Speech ok, c/o rt shoulder & hip pain Lethargic with fentnayl Breathing ok post extubation Off cardene  OBJ: Filed Vitals:   09/07/14 0500 09/07/14 0600 09/07/14 0700 09/07/14 0800  BP: 120/55 111/55 115/51 119/55  Pulse: 69 71 65 73  Temp:    98 F (36.7 C)  TempSrc:    Oral  Resp: 15 15 16 16   Height:      Weight:      SpO2: 91% 97% 100% 92%    NAD HEENT: NCAT No JVD Chest clear Reg, no M noted NABS, soft No LE edema L hemiparesis  I have reviewed all of today's lab results. Relevant abnormalities are discussed in the A/P section  CXR: NACPD  IMPRESSION: Large R hemispheric CVA with L hemiplegia, MRI 2/7 >> Scattered areas of acute infarct in the right MCA territory , Postop re- canalization of the right MCA with a small amount of hemorrhage in the right putamen. VDRF - intubated for IR procedure Mildly elevated Trop I Documented PFO  PLAN/REC: Extubated Swallow eval & advance PT consult Rest of acute CVA care per Stroke team  PCCM to sign off  Happy birthday !  Kara Mead MD. [facility] Flood. [facility] Pager [phone] If no response call [phone]

## 2014-09-07 NOTE — Progress Notes (Signed)
PT Cancellation Note  Patient Details Name: Christy Simmons MRN: [id number] DOB: 1935/03/31   Cancelled Treatment:    Reason Eval/Treat Not Completed: Patient not medically ready, on bedrest, will check back when orders updated.    [name] 09/07/2014, 7:49 AM [name], PT DPT  [phone]

## 2014-09-07 NOTE — Evaluation (Signed)
Occupational Therapy Evaluation Patient Details Name: Christy Simmons MRN: [id number] DOB: 1935-06-10 Today's Date: 09/07/2014    History of Present Illness 80 y.o. female with history of HTN, hyperlipidemia, DM, TIA, PFO with atrial septal aneurysm, presenting with left hemiparesis, decreased responsiveness. pt s/p right hemisphere from right MCA occlusion status post revascularization.   Clinical Impression   This 80 yo female admitted with above presents to acute OT with lethargy, decreased balance, decreased mobility, decrease of left side all affecting her ability to take care of herself at an Independent level as she was pta.She will benefit from continued acute OT with follow up on CIR to get to a S level or better.    Follow Up Recommendations  CIR    Equipment Recommendations   (TBD next venue)       Precautions / Restrictions Precautions Precautions: Fall Restrictions Weight Bearing Restrictions: No      Mobility Bed Mobility Overal bed mobility: Needs Assistance Bed Mobility: Supine to Sit     Supine to sit: Max assist     General bed mobility comments: patient required assist for BLE movement, assist for posterior trunk support and elevation to upright.  Transfers Overall transfer level: Needs assistance Equipment used: 2 person hand held assist Transfers: Sit to/from [facility] Sit to Stand: Mod assist;+2 physical assistance Stand pivot transfers: Mod assist;+2 physical assistance       General transfer comment: Assist for elevation to upright, Bilateral LE knee blocking and assist to initiate pivotal steps to chair. Increased time to perform.    Balance Overall balance assessment: Needs assistance;History of Falls Sitting-balance support: Feet supported;Single extremity supported Sitting balance-Leahy Scale: Poor Sitting balance - Comments: patient requries increased cues and support for upright posture Postural control: Posterior  lean Standing balance support: During functional activity Standing balance-Leahy Scale: Zero                              ADL Overall ADL's : Needs assistance/impaired Eating/Feeding: NPO   Grooming: Maximal assistance;Sitting (supported)   Upper Body Bathing: Maximal assistance;Sitting (supported)   Lower Body Bathing: Maximal assistance (with +2 mod A sit<>stand)   Upper Body Dressing : Total assistance;Sitting (supported )   Lower Body Dressing: Total assistance (with +2 mod A sit<>stand)   Toilet Transfer: Moderate assistance;+2 for physical assistance;Stand-pivot   Toileting- Clothing Manipulation and Hygiene: Total assistance (with +2 mod A sit<>stand)               Vision Eye Alignment: Within Functional Limits Alignment/Gaze Preference: Within Defined Limits Ocular Range of Motion: Within Functional Limits Tracking/Visual Pursuits: Able to track stimulus in all quads without difficulty   Convergence: Within functional limits                Pertinent Vitals/Pain Pain Assessment: 0-10 Pain Score: 6  Pain Location: right hip and sheath insertion site Pain Descriptors / Indicators: Aching;Discomfort;Grimacing Pain Intervention(s): Limited activity within patient's tolerance;Monitored during session;Premedicated before session;Repositioned     Hand Dominance Right   Extremity/Trunk Assessment Upper Extremity Assessment Upper Extremity Assessment: LUE deficits/detail LUE Deficits / Details: grossly Brunnstrum 3-4/7 LUE Coordination: decreased fine motor;decreased gross motor   Lower Extremity Assessment Lower Extremity Assessment: RLE deficits/detail;LLE deficits/detail RLE Deficits / Details: weakness bilaterally less then 3 gross movements (limited by pain) RLE Coordination: decreased fine motor;decreased gross motor LLE Deficits / Details: weakness bilaterally less then 3 gross movements, 2/5 (left sided  weakness > right) LLE  Coordination: decreased fine motor;decreased gross motor       Communication Communication Communication:  (talks in low tone)   Cognition Arousal/Alertness: Lethargic Behavior During Therapy: Flat affect Overall Cognitive Status: Impaired/Different from baseline Area of Impairment: Memory     Memory: Decreased short-term memory (last thing she remembers is going to bed, not getting up to BR)                        [place] expects to be discharged to:: Private residence Living Arrangements: Spouse/significant other   Type of Home: House Home Access: Stairs to enter Technical brewer of Steps: 2 Entrance Stairs-Rails: None Home Layout: One level     Bathroom Shower/Tub: Occupational psychologist: [place]: Environmental consultant - 2 wheels;Cane - single point;Bedside commode   Additional Comments: walk in shower and tub shower      Prior Functioning/Environment Level of Independence: Independent             OT Diagnosis: Generalized weakness;Cognitive deficits;Acute pain;Hemiplegia non-dominant side   OT Problem List: Decreased strength;Decreased range of motion;Decreased activity tolerance;Impaired balance (sitting and/or standing);Decreased coordination;Decreased cognition;Decreased knowledge of use of DME or AE;Impaired sensation;Impaired tone;Impaired UE functional use;Pain   OT Treatment/Interventions: Self-care/ADL training;Neuromuscular education;DME and/or AE instruction;Cognitive remediation/compensation;Therapeutic activities;Balance training;Patient/family education;Therapeutic exercise    OT Goals(Current goals can be found in the care plan section) Acute Rehab OT Goals Patient Stated Goal: to go home OT Goal Formulation: With patient Time For Goal Achievement: 09/21/14 Potential to Achieve Goals: Good  OT Frequency: Min 3X/week           Co-evaluation PT/OT/SLP Co-Evaluation/Treatment: Yes Reason for  Co-Treatment: Complexity of the patient's impairments (multi-system involvement)   OT goals addressed during session: ADL's and self-care;Strengthening/ROM      End of Session Equipment Utilized During Treatment: Gait belt Nurse Communication: Mobility status  Activity Tolerance: Patient limited by lethargy;Patient limited by fatigue Patient left: in chair;with call bell/phone within reach;with family/visitor present   Time: 5015-8682 OT Time Calculation (min): 29 min Charges:  OT General Charges $OT Visit: 1 Procedure OT Evaluation $Initial OT Evaluation Tier I: 1 Procedure  Almon Register [phone] 09/07/2014, 11:44 AM

## 2014-09-07 NOTE — Progress Notes (Signed)
UR completed.  Reiss Mowrey, RN BSN MHA CCM Trauma/Neuro ICU Case Manager [phone]  

## 2014-09-08 ENCOUNTER — Inpatient Hospital Stay (HOSPITAL_COMMUNITY): Payer: Medicare Other

## 2014-09-08 LAB — GLUCOSE, CAPILLARY
GLUCOSE-CAPILLARY: 144 mg/dL — AB (ref 70–99)
GLUCOSE-CAPILLARY: 133 mg/dL — AB (ref 70–99)
GLUCOSE-CAPILLARY: 112 mg/dL — AB (ref 70–99)
GLUCOSE-CAPILLARY: 135 mg/dL — AB (ref 70–99)
Glucose-Capillary: 102 mg/dL — ABNORMAL HIGH (ref 70–99)
Glucose-Capillary: 103 mg/dL — ABNORMAL HIGH (ref 70–99)
Glucose-Capillary: 107 mg/dL — ABNORMAL HIGH (ref 70–99)
Glucose-Capillary: 137 mg/dL — ABNORMAL HIGH (ref 70–99)
Glucose-Capillary: 141 mg/dL — ABNORMAL HIGH (ref 70–99)

## 2014-09-08 MED ORDER — HYDROMORPHONE HCL 1 MG/ML IJ SOLN
1.0000 mg | INTRAMUSCULAR | Status: DC | PRN
  Administered 2014-09-08 – 2014-09-09 (×3): 1 mg via INTRAVENOUS
  Filled 2014-09-08 (×3): qty 1

## 2014-09-08 MED ORDER — HYDROMORPHONE HCL 1 MG/ML IJ SOLN
1.0000 mg | Freq: Once | INTRAMUSCULAR | Status: AC
  Administered 2014-09-08: 1 mg via INTRAVENOUS

## 2014-09-08 MED ORDER — HYDROMORPHONE HCL 1 MG/ML IJ SOLN
INTRAMUSCULAR | Status: AC
  Filled 2014-09-08: qty 1

## 2014-09-08 MED ORDER — RESOURCE THICKENUP CLEAR PO POWD
ORAL | Status: DC | PRN
  Filled 2014-09-08 (×2): qty 125

## 2014-09-08 NOTE — Progress Notes (Signed)
STROKE TEAM PROGRESS NOTE   HISTORY Christy Simmons is a 80 y.o. female with a past medical history significant for HTN, hyperlipidemia, DM, TIA, PFO with atrial septal aneurysm, brought in via EMS as a code stroke due to acute onset of left hemiparesis, decreased responsiveness. Patient was last known well around 8 pm last night. Husband stated that she hasn't been feeling well in the last month, and this morning he found her in the bathroom around 6 or 6:30 unable to move the left side and not fully responsive. He thinks that she got up to to the bathroom probably around 4 or 4:30 am. Upon arrival to the ED she was able to answer simple questions, and had NIHSS 16. CT brain showed an area of well defined area of hypodensity around the right basal ganglia.  Date last known well: 09/04/14 Time last known well: 8 pm tPA Given: no, out of the window for IV tpa, but patient taken to the IR suite for endovascular treatment. NIHSS: 16    SUBJECTIVE (INTERVAL HISTORY) Patient  Passed swallow eval. LE venous doppler shows DVT in popliteal vein but not a anticoagulation candidate due to hemorrhagic transformation  She still has left hemiparesis. Blood pressure has been adequately controlled. MRI scan shows patchy right MCA branch infarcts with small hemorrhagic transformation in the right basal ganglia but no significant midline shift..   OBJECTIVE Temp:  [97.7 F (36.5 C)-99.1 F (37.3 C)] 99.1 F (37.3 C) (02/09 1437) Pulse Rate:  [65-78] 78 (02/09 1437) Cardiac Rhythm:  [-] Normal sinus rhythm (02/09 0800) Resp:  [20] 20 (02/09 1437) BP: (108-158)/(41-74) 124/53 mmHg (02/09 1437) SpO2:  [94 %-100 %] 94 % (02/09 1437)   Recent Labs Lab 09/06/14 1958 09/06/14 2028 09/06/14 2308 09/07/14 0356 09/08/14 1156  GLUCAP 55* 102* 142* 134* 135*    Recent Labs Lab 09/05/14 0744 09/05/14 0755 09/05/14 1302 09/06/14 0300  NA 129* 130* 130* 137  K 4.4 4.5 3.8 3.5  CL 95* 97  --  106  CO2 22   --   --  24  GLUCOSE 518* 510*  --  129*  BUN 27* 32*  --  20  CREATININE 1.02 0.80  --  0.84  CALCIUM 9.0  --   --  8.2*    Recent Labs Lab 09/05/14 0744  AST 30  ALT 23  ALKPHOS 71  BILITOT 0.5  PROT 6.3  ALBUMIN 3.6    Recent Labs Lab 09/05/14 0744 09/05/14 0755 09/05/14 1302 09/06/14 0300  WBC 23.6*  --   --  14.6*  NEUTROABS 19.4*  --   --  10.5*  HGB 13.6 15.3* 12.2 10.4*  HCT 38.8 45.0 36.0 30.0*  MCV 84.0  --   --  83.1  PLT 154  --   --  130*    Recent Labs Lab 09/05/14 0749 09/05/14 1508 09/05/14 2055 09/06/14 0300  CKTOTAL 89  --   --   --   TROPONINI 0.18* 1.15* 1.34* 1.50*   No results for input(s): LABPROT, INR in the last 72 hours. No results for input(s): COLORURINE, LABSPEC, [place], GLUCOSEU, HGBUR, BILIRUBINUR, KETONESUR, PROTEINUR, UROBILINOGEN, NITRITE, LEUKOCYTESUR in the last 72 hours.  Invalid input(s): APPERANCEUR     Component Value Date/Time   CHOL 180 09/06/2014 0300   TRIG 135 09/06/2014 0300   HDL 52 09/06/2014 0300   CHOLHDL 3.5 09/06/2014 0300   VLDL 27 09/06/2014 0300   LDLCALC 101* 09/06/2014 0300   Lab  Results  Component Value Date   HGBA1C 12.0* 09/06/2014      Component Value Date/Time   LABOPIA NONE DETECTED 09/05/2014 0820   COCAINSCRNUR NONE DETECTED 09/05/2014 0820   LABBENZ NONE DETECTED 09/05/2014 0820   AMPHETMU NONE DETECTED 09/05/2014 0820   THCU NONE DETECTED 09/05/2014 0820   LABBARB NONE DETECTED 09/05/2014 0820     Recent Labs Lab 09/05/14 0744  ETH <5    Ct Angio Head and Neck W/cm &/or Wo Cm 09/05/2014    1. Distal right M1 occlusion.  2. Abnormal perfusion throughout essentially the entirety of the right MCA territory. Much of this appears to represent oligemic brain with relatively preserved cerebral blood volume, however there is likely a developing region of core infarct involving the deep white matter.      Ct Head Wo Contrast 09/05/2014    Sequelae of interval cerebral angiography  and right MCA revascularization. No acute hemorrhage.        Ct Head and cervical spine Wo Contrast 09/05/2014    1. Interval development of ill-defined hypo densities within the bilateral centrum semiovale, right greater than left, favored to represent progressive age-indeterminate though at least subacute microvascular ischemic disease continued since remote examination performed 11/2012. Otherwise, no definite acute intracranial process.  2. No fracture or static subluxation of the cervical spine.  3. Moderate to severe multilevel cervical spine DDD, worse at C4-C5, C5-C6 and C6-C7.  4. Asymmetric enlargement of the right lobe of the thyroid and thyroid isthmus with suspected thyroid nodules as detailed above. While these nodules appear grossly similar to compared to the 07/2010 thyroid ultrasound, further evaluation could be performed with repeat dedicated non emergent thyroid ultrasound as clinically indicated.     Dg Pelvis Portable 09/05/2014    No acute fracture or dislocation.  Note that the lateral aspect of the trochanteric region of the right femur is not completely imaged. Recommend an additional cone-down view of the right hip for complete evaluation.      Dg Hip Unilat With Pelvis 2-3 Views Right 09/05/2014    No evidence of acute fracture or malalignment        Ct Cerebral Perfusion W/cm 09/05/2014    1. Distal right M1 occlusion.  2. Abnormal perfusion throughout essentially the entirety of the right MCA territory. Much of this appears to represent oligemic brain with relatively preserved cerebral blood volume, however there is likely a developing region of core infarct involving the deep white matter.     Dg Chest Port 1 View 09/05/2014    No acute cardiopulmonary disease.      Dg Chest Port 1v Same Day 09/05/2014    No acute findings.     Interventional Radiology Procedure 09/05/2014 S/P rt vert artery angiogram and RT common carotid areriogram,followed by complete  revascularization of occluded RT MCA prox with x1 pass with the Solitaire FR 105mm x 40 mm retrieval device and 5 mg of superselective INTEGRELIN. TICI 3 flow restored    PHYSICAL EXAM Frail elderly Caucasian lady.  . Afebrile. Head is nontraumatic. Neck is supple without bruit.    Cardiac exam no murmur or gallop. Lungs are clear to auscultation. Distal pulses are well felt. Neurological Exam : Patient is is awake and alert. Speech is slow and hesitant but clearly without dysarthria or aphasia. She follows midline commands and simple one-step commands in all 4 extremities. Slight right gaze preference but able to look to the left past midline. Pupils equal reactive. Fundi were  not visualized. Left lower facial weakness. Tongue midline. Left hemiparesis 3/5 strength. With distal greater than proximal weakness Right lower extremity exam limited due to hip pain Right plantar is equivocal left upgoing. Sensation and coordination mildly impaired on the left . Gait deferred     ASSESSMENT/PLAN Christy Simmons is a 80 y.o. female with history of HTN, hyperlipidemia, DM, TIA, PFO with atrial septal aneurysm, presenting with left hemiparesis, decreased responsiveness. She did not receive IV t-PA - out of the window for IV tpa, but patient taken to the IR suite for endovascular treatment..   Stroke:  Non-dominant. Right hemisphere from right MCA occlusion status post revascularization with 5 mg intra-arterial Integrilin and solitaire device from paradoxical embolism from LE DVT and atrial septal defect  Resultant  left hemiparesis. MRI  Multiple areas of acute infarct are present indicative of cerebral Emboli. re- canalization of the right MCA with a small amount of hemorrhage in the right putamen. There are scattered areas of infarct involving the right insula and right parietal lobe however there is no lobar right MCA infarct.  Carotid Doppler - See CT angiogram of the neck.  2D Echo pending  LDL  101  HgbA1c 12  SCDs for VTE prophylaxis  DIET - DYS 1 no liquids  aspirin 81 mg orally every day prior to admission, now on no antithrombotic  .  Ongoing aggressive stroke risk factor management  Therapy recommendations:  CLR  Disposition:  CLR  Hypertension  Home meds: Lisinopril-hydrochlorothiazide  BP low at times.   Hyperlipidemia  Home meds: No lipid lowering medications prior to admission.  LDL 101, goal < 70  Add statin once swallowing has been cleared.  Continue statin at discharge  Diabetes  HgbA1c pending, goal < 7.0  Uncontrolled  Other Stroke Risk Factors  Advanced age  Hx TIA  PFO- check LE venous dopplers for DVT   Other Active Problems  Anemia  Leukocytosis  Intubated  Elevated troponin levels  DVT right popliteal vein but at risk for anticoagulation due to hemorrhagic infarct- repeat head CT in am IR  consult for IVC filter  Other Pertinent History    Hospital day # 3   Mobilize out of bed. Continue ongoing Physical therapy occupational therapy and speech therapy consults. Rehabilitation consult. Strict control of blood pressure.     Antony Contras, MD Medical Director [facility] Pager: [phone] 09/08/2014 3:30 PM     To contact Stroke Continuity provider, please refer to [email or web address] After hours, contact General Neurology

## 2014-09-08 NOTE — Progress Notes (Signed)
Physical Therapy Treatment Patient Details Name: Christy Simmons MRN: [id number] DOB: 1934/12/24 Today's Date: 09/08/2014    History of Present Illness 80 y.o. female with history of HTN, hyperlipidemia, DM, TIA, PFO with atrial septal aneurysm, presenting with left hemiparesis, decreased responsiveness. pt s/p right hemisphere from right MCA occlusion status post revascularization.    PT Comments    Pt. Was lethargic , but able to follow commands with increased time. Pt. Reported 4/10 at beginning of session which increased to 5/10. Progressed to ambulation for the first time, which she responded to well. Would benefit from postural control to correct posterior and left lean, standing balance and gait training next session. Would be a good candidate for CIR for strengthening because of her postural, balance and ambulation deficits.   Follow Up Recommendations  CIR;Supervision/Assistance - 24 hour     Equipment Recommendations  Other (comment)    Recommendations for Other Services Rehab consult     Precautions / Restrictions Precautions Precautions: Fall Restrictions Weight Bearing Restrictions: No    Mobility  Bed Mobility Overal bed mobility: Needs Assistance Bed Mobility: Rolling;Sidelying to Sit Rolling: Min assist;+2 for physical assistance Sidelying to sit: Mod assist;+2 for physical assistance       General bed mobility comments: patient able to get BLE off EOB alone; rolling min A to get initiated then pt. used bed rail to complete; mod A for posterior trunk and positioning of BLE to come to upright.  Transfers Overall transfer level: Needs assistance Equipment used: 2 person hand held assist Transfers: Sit to/from [facility] Sit to Stand: Mod assist;+2 physical assistance Stand pivot transfers: Mod assist;+2 physical assistance       General transfer comment: Assist for elevation to upright, Bilateral LE knee blocking and assist to initiate pivotal  steps to chair. Increased time to perform.  Ambulation/Gait Ambulation/Gait assistance: Mod assist;+2 physical assistance Ambulation Distance (Feet): 4 Feet Assistive device: 2 person hand held assist Gait Pattern/deviations: Shuffle;Step-to pattern;Decreased stride length;Trunk flexed Gait velocity: decreased Gait velocity interpretation: Below normal speed for age/gender General Gait Details: no buckling of LEs, slow, guarded gait to chair   Stairs            Wheelchair Mobility    Modified Rankin (Stroke Patients Only) Modified Rankin (Stroke Patients Only) Pre-Morbid Rankin Score: No symptoms Modified Rankin: Moderately severe disability     Balance Overall balance assessment: Needs assistance;History of Falls Sitting-balance support: No upper extremity supported;Feet supported Sitting balance-Leahy Scale: Poor Sitting balance - Comments: patient requries increased cues and support for upright posture Postural control: Posterior lean Standing balance support: During functional activity Standing balance-Leahy Scale: Zero                      Cognition Arousal/Alertness: Lethargic Behavior During Therapy: Flat affect Overall Cognitive Status: Impaired/Different from baseline Area of Impairment: Following commands       Following Commands: Follows one step commands with increased time       General Comments: drowsy during session.     Exercises Other Exercises Other Exercises: PNF D1 and D2 UE x 5    General Comments General comments (skin integrity, edema, etc.): scratchy dry throat and chapped lips. Applied vaseline to lips and moist swab to her mouth      Pertinent Vitals/Pain Pain Assessment: Faces Pain Score: 5  Faces Pain Scale: Hurts even more Pain Location: right hip and lateal lower back Pain Descriptors / Indicators: Grimacing;Aching;Discomfort Pain Intervention(s): Monitored  during session;Patient requesting pain meds-RN  notified;Limited activity within patient's tolerance    Home Living                      Prior Function            PT Goals (current goals can now be found in the care plan section) Progress towards PT goals: Progressing toward goals    Frequency  Min 3X/week    PT Plan Current plan remains appropriate    Co-evaluation             End of Session Equipment Utilized During Treatment: Gait belt Activity Tolerance: Patient limited by lethargy Patient left: in chair;with call bell/phone within reach;with family/visitor present;with chair alarm set     Time: 1326-1410 PT Time Calculation (min) (ACUTE ONLY): 44 min  Charges:  $Therapeutic Activity: 23-37 mins                    G Codes:      Jodi Geralds , [place] 09/08/2014, 2:55 PM

## 2014-09-08 NOTE — Progress Notes (Signed)
I met with pt, spouse, and family at bedside. We discussed a possible inpt rehab admission pending insurance approval and bed availability when pt medically ready. I will begin authorization today. [phone]

## 2014-09-08 NOTE — Procedures (Signed)
Objective Swallowing Evaluation: Modified Barium Swallowing Study  Patient Details  Name: Christy Simmons MRN: [id number] Date of Birth: [birth year]  Today's Date: 09/08/2014 Time: SLP Start Time (ACUTE ONLY): 1045-SLP Stop Time (ACUTE ONLY): 1105 SLP Time Calculation (min) (ACUTE ONLY): 20 min  Past Medical History:  Past Medical History  Diagnosis Date  . Transient ischemic attack   . HTN (hypertension)   . Hyperlipidemia   . DM (diabetes mellitus)   . GERD (gastroesophageal reflux disease)   . Heart disease     PFO  . Amnesia 01/10/2013  . Patent foramen ovale with atrial septal aneurysm   . Complication of anesthesia   . PONV (postoperative nausea and vomiting)   . Family history of anesthesia complication     "My brother has trouble waking up."  . Glaucoma     Hx: of  . Early cataracts, bilateral     Hx: of  . Arthritis    Past Surgical History:  Past Surgical History  Procedure Laterality Date  . US echocardiography  10-13-11    EF 60-65% normal  . Back surgery      disc   . Tonsillectomy    . Tee without cardioversion  10/30/2011    Procedure: TRANSESOPHAGEAL ECHOCARDIOGRAM (TEE);  Surgeon: Larey Dresser, MD;  Location: [facility];  Service: Cardiovascular;  Laterality: N/A;  . Colonoscopy w/ biopsies and polypectomy      HX;of  . Tonsillectomy    . Tubal ligation    . Lumbar laminectomy/decompression microdiscectomy N/A 09/11/2013    Procedure: LUMBAR LAMINECTOMY/DECOMPRESSION MICRODISCECTOMY 2 LEVELS;  Surgeon: Ophelia Charter, MD;  Location: [facility];  Service: Neurosurgery;  Laterality: N/A;  Lumbar Four-Five Laminectomy and Foraminotomy with redo Lumbar Five-Sacral One diskectomy  . Radiology with anesthesia N/A 09/05/2014    Procedure: RADIOLOGY WITH ANESTHESIA;  Surgeon: Rob Hickman, MD;  Location: [facility];  Service: Radiology;  Laterality: N/A;   HPI:  HPI: 80 y.o. female with a past medical history significant for HTN, hyperlipidemia, DM, TIA, PFO  with atrial septal aneurysm admitted with left hemiparesis and decreased responsiveness. MRI showed Multiple areas of acute infarct are present indicative of cerebral emboli, right MCA branch infarcts including the right insula and basal ganglia with small hemorrhagic transformation in the right basal ganglis. In addition, infarcts in the left parietal lobe, bilateral PICA and left occipital lobe.  Underwent cerebral angiogram with complete revascularization of occluded RT MCA 2/6. Intubated 2/6-2/7. MBS recommended following bedside swallow assessment.    No Data Recorded  Assessment / Plan / Recommendation CHL IP CLINICAL IMPRESSIONS 09/08/2014  Dysphagia Diagnosis Mild oral phase dysphagia;Moderate oral phase dysphagia;Mild pharyngeal phase dysphagia  Clinical impression Pt exhibits a mild-moderate oral dyspahgia due to motor weakness leading to mild lingual residue and decreased mandibular excursion and delayed transit with solid and mechanical soft textures. Pharyngeal phase characterized by primarily motor impairments of decreased laryngeal elevation and incomplete closure with penetration of nectar barium (decreased sensation). No significant pharyngeal residue. Pt's demonstrating fatigue and decreased endurance throughout study increasing aspiration risk. Throat clearing present during when barium was not observed in laryngeal vestibule. Recommend Dys 1 diet and honey thick liquids, full supervision, crush pills, no straws. ST will continue intervention.       CHL IP TREATMENT RECOMMENDATION 09/08/2014  Treatment Plan Recommendations Therapy as outlined in treatment plan below     CHL IP DIET RECOMMENDATION 09/08/2014  Diet Recommendations Dysphagia 1 (Puree);Honey-thick liquid  Liquid Administration via  Cup;No straw  Medication Administration Crushed with puree  Compensations Slow rate;Small sips/bites  Postural Changes and/or Swallow Maneuvers Seated upright 90 degrees;Upright 30-60 min after  meal     CHL IP OTHER RECOMMENDATIONS 09/08/2014  Recommended Consults (None)  Oral Care Recommendations Oral care BID  Other Recommendations Order thickener from pharmacy     CHL IP FOLLOW UP RECOMMENDATIONS 09/08/2014  Follow up Recommendations (No Data)     CHL IP FREQUENCY AND DURATION 09/08/2014  Speech Therapy Frequency (ACUTE ONLY) min 2x/week  Treatment Duration 2 weeks     Pertinent Vitals/Pain Pt reported no pain since received pain meds this morning    SLP Swallow Goals No flowsheet data found.  No flowsheet data found.    CHL IP REASON FOR REFERRAL 09/08/2014  Reason for Referral Objectively evaluate swallowing function     CHL IP ORAL PHASE 09/08/2014  Lips (None)  Tongue (None)  Mucous membranes (None)  Nutritional status (None)  Other (None)  Oxygen therapy (None)  Oral Phase Impaired  Oral - Pudding Teaspoon (None)  Oral - Pudding Cup (None)  Oral - Honey Teaspoon (None)  Oral - Honey Cup Lingual/palatal residue  Oral - Honey Syringe (None)  Oral - Nectar Teaspoon (None)  Oral - Nectar Cup (None)  Oral - Nectar Straw (None)  Oral - Nectar Syringe (None)  Oral - Ice Chips (None)  Oral - Thin Teaspoon (None)  Oral - Thin Cup (None)  Oral - Thin Straw (None)  Oral - Thin Syringe (None)  Oral - Puree (None)  Oral - Mechanical Soft Delayed oral transit;Weak lingual manipulation  Oral - Regular Weak lingual manipulation;Delayed oral transit  Oral - Multi-consistency (None)  Oral - Pill (None)  Oral Phase - Comment (None)      CHL IP PHARYNGEAL PHASE 09/08/2014  Pharyngeal Phase Impaired  Pharyngeal - Pudding Teaspoon (None)  Penetration/Aspiration details (pudding teaspoon) (None)  Pharyngeal - Pudding Cup (None)  Penetration/Aspiration details (pudding cup) (None)  Pharyngeal - Honey Teaspoon (None)  Penetration/Aspiration details (honey teaspoon) (None)  Pharyngeal - Honey Cup [facility]  Penetration/Aspiration details (honey cup) (None)  Pharyngeal - Honey  Syringe (None)  Penetration/Aspiration details (honey syringe) (None)  Pharyngeal - Nectar Teaspoon WFL  Penetration/Aspiration details (nectar teaspoon) (None)  Pharyngeal - Nectar Cup Penetration/Aspiration during swallow;Reduced laryngeal elevation;Reduced airway/laryngeal closure  Penetration/Aspiration details (nectar cup) Material enters airway, remains ABOVE vocal cords and not ejected out  Pharyngeal - Nectar Straw (None)  Penetration/Aspiration details (nectar straw) (None)  Pharyngeal - Nectar Syringe (None)  Penetration/Aspiration details (nectar syringe) (None)  Pharyngeal - Ice Chips (None)  Penetration/Aspiration details (ice chips) (None)  Pharyngeal - Thin Teaspoon (None)  Penetration/Aspiration details (thin teaspoon) (None)  Pharyngeal - Thin Cup (None)  Penetration/Aspiration details (thin cup) (None)  Pharyngeal - Thin Straw (None)  Penetration/Aspiration details (thin straw) (None)  Pharyngeal - Thin Syringe (None)  Penetration/Aspiration details (thin syringe') (None)  Pharyngeal - Puree Delayed swallow initiation;Premature spillage to valleculae  Penetration/Aspiration details (puree) (None)  Pharyngeal - Mechanical Soft WFL  Penetration/Aspiration details (mechanical soft) (None)  Pharyngeal - Regular WFL  Penetration/Aspiration details (regular) (None)  Pharyngeal - Multi-consistency (None)  Penetration/Aspiration details (multi-consistency) (None)  Pharyngeal - Pill (None)  Penetration/Aspiration details (pill) (None)  Pharyngeal Comment (None)     CHL IP CERVICAL ESOPHAGEAL PHASE 09/08/2014  Cervical Esophageal Phase WFL  Pudding Teaspoon (None)  Pudding Cup (None)  Honey Teaspoon (None)  Honey Cup (None)  Honey Syringe (None)  Nectar Teaspoon (None)  Nectar Cup (None)  Nectar Straw (None)  Nectar Syringe (None)  Thin Teaspoon (None)  Thin Cup (None)  Thin Straw (None)  Thin Syringe (None)  Cervical Esophageal Comment (None)    No flowsheet  data found.         Houston Siren 09/08/2014, 12:24 PM   Orbie Pyo [place] Pager [phone]  Orbie Pyo [place] [phone]

## 2014-09-08 NOTE — Progress Notes (Signed)
Speech Language Pathology    Patient Details Name: Christy Simmons MRN: [id number] DOB: [birth year] Today's Date: 09/08/2014 Time:  -     MBS scheduled for 10:30 today.  Orbie Pyo [place] [phone]

## 2014-09-08 NOTE — Progress Notes (Signed)
Patient and family c/o uncontrolled pain last night. Recd order for Dilaudid 1 mg IV, and to remove NGT for MBS. Family hollaring at [facility] demanding to see Medical director etc. IV Dilaudid given over 10 minutes.

## 2014-09-09 ENCOUNTER — Inpatient Hospital Stay (HOSPITAL_COMMUNITY): Payer: Medicare Other

## 2014-09-09 ENCOUNTER — Encounter (HOSPITAL_COMMUNITY): Payer: Self-pay

## 2014-09-09 DIAGNOSIS — I619 Nontraumatic intracerebral hemorrhage, unspecified: Secondary | ICD-10-CM | POA: Insufficient documentation

## 2014-09-09 HISTORY — PX: OTHER SURGICAL HISTORY: SHX169

## 2014-09-09 LAB — GLUCOSE, CAPILLARY
GLUCOSE-CAPILLARY: 122 mg/dL — AB (ref 70–99)
GLUCOSE-CAPILLARY: 190 mg/dL — AB (ref 70–99)
GLUCOSE-CAPILLARY: 48 mg/dL — AB (ref 70–99)
GLUCOSE-CAPILLARY: 86 mg/dL (ref 70–99)
Glucose-Capillary: 71 mg/dL (ref 70–99)
Glucose-Capillary: 115 mg/dL — ABNORMAL HIGH (ref 70–99)
Glucose-Capillary: 123 mg/dL — ABNORMAL HIGH (ref 70–99)
Glucose-Capillary: 74 mg/dL (ref 70–99)
Glucose-Capillary: 101 mg/dL — ABNORMAL HIGH (ref 70–99)
Glucose-Capillary: 257 mg/dL — ABNORMAL HIGH (ref 70–99)

## 2014-09-09 MED ORDER — IOHEXOL 300 MG/ML  SOLN
100.0000 mL | Freq: Once | INTRAMUSCULAR | Status: AC | PRN
  Administered 2014-09-09: 40 mL via INTRAVENOUS

## 2014-09-09 MED ORDER — GLUCERNA SHAKE PO LIQD
237.0000 mL | Freq: Three times a day (TID) | ORAL | Status: DC
  Administered 2014-09-10: 237 mL via ORAL

## 2014-09-09 MED ORDER — ASPIRIN 81 MG PO CHEW
81.0000 mg | CHEWABLE_TABLET | Freq: Every day | ORAL | Status: DC
  Administered 2014-09-09 – 2014-09-10 (×2): 81 mg via ORAL
  Filled 2014-09-09 (×2): qty 1

## 2014-09-09 MED ORDER — FENTANYL CITRATE 0.05 MG/ML IJ SOLN
INTRAMUSCULAR | Status: AC
  Filled 2014-09-09: qty 2

## 2014-09-09 MED ORDER — DEXTROSE 50 % IV SOLN
INTRAVENOUS | Status: AC
  Administered 2014-09-09: 25 mL
  Filled 2014-09-09: qty 50

## 2014-09-09 MED ORDER — LIDOCAINE HCL 1 % IJ SOLN
INTRAMUSCULAR | Status: AC
  Filled 2014-09-09: qty 20

## 2014-09-09 NOTE — Procedures (Signed)
Successful placement of IVC filter (Celect filter).  No immediate complication.  See Imaging report.

## 2014-09-09 NOTE — H&P (Signed)
Reason for Consult: Hemorrhagic stroke with RLE DVT Chief Complaint: Chief Complaint  Patient presents with  . Code Stroke   Referring Physician(s): Neurology   History of Present Illness: Christy Simmons is a 80 y.o. female with a recent hemorrhagic stroke and found to have b/l DVT therefore unable to be on anticoagulation. LE venous duplex done on 2/8 revealed Right non occlusive DVT in popliteal vein and Left DVT noted in PTV. IR received request for IVC filter. The patient c/o right sided neck pain today. The patient is in agreement with IVC filter. Family is with the patient today and also agrees to move forward with the procedure.   Past Medical History  Diagnosis Date  . Transient ischemic attack   . HTN (hypertension)   . Hyperlipidemia   . DM (diabetes mellitus)   . GERD (gastroesophageal reflux disease)   . Heart disease     PFO  . Amnesia 01/10/2013  . Patent foramen ovale with atrial septal aneurysm   . Complication of anesthesia   . PONV (postoperative nausea and vomiting)   . Family history of anesthesia complication     "My brother has trouble waking up."  . Glaucoma     Hx: of  . Early cataracts, bilateral     Hx: of  . Arthritis     Past Surgical History  Procedure Laterality Date  . US echocardiography  10-13-11    EF 60-65% normal  . Back surgery      disc   . Tonsillectomy    . Tee without cardioversion  10/30/2011    Procedure: TRANSESOPHAGEAL ECHOCARDIOGRAM (TEE);  Surgeon: Larey Dresser, MD;  Location: [facility];  Service: Cardiovascular;  Laterality: N/A;  . Colonoscopy w/ biopsies and polypectomy      HX;of  . Tonsillectomy    . Tubal ligation    . Lumbar laminectomy/decompression microdiscectomy N/A 09/11/2013    Procedure: LUMBAR LAMINECTOMY/DECOMPRESSION MICRODISCECTOMY 2 LEVELS;  Surgeon: Ophelia Charter, MD;  Location: [facility];  Service: Neurosurgery;  Laterality: N/A;  Lumbar Four-Five Laminectomy and Foraminotomy with redo Lumbar  Five-Sacral One diskectomy  . Radiology with anesthesia N/A 09/05/2014    Procedure: RADIOLOGY WITH ANESTHESIA;  Surgeon: Rob Hickman, MD;  Location: [facility];  Service: Radiology;  Laterality: N/A;    Allergies: Gabapentin and Codeine  Medications: Prior to Admission medications   Medication Sig Start Date End Date Taking? Authorizing Provider  aspirin EC 81 MG tablet Take 81 mg by mouth daily.   Yes Historical Provider, MD  brimonidine (ALPHAGAN) 0.2 % ophthalmic solution Place 1 drop into both eyes 2 (two) times daily.  11/18/12  Yes Historical Provider, MD  Calcium Citrate-Vitamin D 250-200 MG-UNIT TABS Take 1 tablet by mouth daily.   Yes Historical Provider, MD  Cholecalciferol (VITAMIN D3) 1000 UNITS CAPS Take 1,000 Units by mouth daily.   Yes Historical Provider, MD  esomeprazole (NEXIUM) 20 MG capsule Take 20 mg by mouth daily.    Yes Historical Provider, MD  ibuprofen (ADVIL,MOTRIN) 200 MG tablet Take 200 mg by mouth daily as needed for mild pain.    Yes Historical Provider, MD  insulin glargine (LANTUS) 100 UNIT/ML injection Inject 20 Units into the skin daily.    Yes Historical Provider, MD  insulin lispro (HUMALOG) 100 UNIT/ML injection Inject into the skin 3 (three) times daily before meals. Per sliding scale - average 15 units   Yes Historical Provider, MD  lisinopril-hydrochlorothiazide (PRINZIDE,ZESTORETIC) 20-25 MG per  tablet Take 1 tablet by mouth daily.   Yes Historical Provider, MD  Magnesium 200 MG TABS Take 400 mg by mouth daily.   Yes Historical Provider, MD  metoCLOPramide (REGLAN) 5 MG tablet Take 5 mg by mouth 4 (four) times daily -  before meals and at bedtime.    Yes Historical Provider, MD  Misc Natural Products (OSTEO BI-FLEX ADV JOINT SHIELD) TABS Take 1 tablet by mouth daily.   Yes Historical Provider, MD  Multiple Vitamin (MULITIVITAMIN WITH MINERALS) TABS Take 1 tablet by mouth daily.   Yes Historical Provider, MD  traMADol (ULTRAM) 50 MG tablet Take 50-100  mg by mouth 2 (two) times daily as needed for moderate pain.  11/20/13  Yes Historical Provider, MD  diazepam (VALIUM) 5 MG tablet Take 1 tablet (5 mg total) by mouth every 6 (six) hours as needed for muscle spasms. Patient not taking: Reported on 09/05/2014 09/13/13   Consuella Lose, MD  oxyCODONE-acetaminophen (PERCOCET/ROXICET) 5-325 MG per tablet Take 1-2 tablets by mouth every 4 (four) hours as needed for moderate pain. Patient not taking: Reported on 09/05/2014 09/13/13   Consuella Lose, MD    Family History  Problem Relation Age of Onset  . Stroke      No family history of such  . Congenital heart disease      No family history of such  . Anesthesia problems Neg Hx   . Diabetes Maternal Grandfather     History   Social History  . Marital Status: Married    Spouse Name: N/A  . Number of Children: 5  . Years of Education: 14   Occupational History  .     Social History Main Topics  . Smoking status: Never Smoker   . Smokeless tobacco: Never Used  . Alcohol Use: No  . Drug Use: No  . Sexual Activity: Not on file   Other Topics Concern  . None   Social History Narrative    Review of Systems  Vital Signs: BP 140/66 mmHg  Pulse 80  Temp(Src) 98.6 F (37 C) (Oral)  Resp 18  Ht 5\' 4"  (1.626 m)  Wt 122 lb 5.7 oz (55.5 kg)  BMI 20.99 kg/m2  SpO2 93%  Physical Exam  Constitutional: No distress.  Neck: No tracheal deviation present.  Cardiovascular: Normal rate and regular rhythm.  Exam reveals no friction rub.   No murmur heard. Pulmonary/Chest: Effort normal and breath sounds normal. No respiratory distress. She has no wheezes. She has no rales.  Abdominal: Soft. Bowel sounds are normal. She exhibits no distension. There is no tenderness.  Neurological:  Awake, follows simple commands, NAD  Skin: She is not diaphoretic.   Imaging: Ct Angio Head W/cm &/or Wo Cm  09/05/2014   CLINICAL DATA:  Left-sided paralysis.  Acute stroke.  EXAM: CT ANGIOGRAPHY HEAD  AND NECK  CT CEREBRAL PERFUSION  TECHNIQUE: Multidetector CT imaging of the head and neck was performed using the standard protocol during bolus administration of intravenous contrast. Multiplanar CT image reconstructions and MIPs were obtained to evaluate the vascular anatomy. Carotid stenosis measurements (when applicable) are obtained utilizing NASCET criteria, using the distal internal carotid diameter as the denominator. CT cerebral perfusion was performed during bolus IV contrast injection.  CONTRAST:  167mL OMNIPAQUE IOHEXOL 350 MG/ML SOLN  COMPARISON:  Head CT earlier today. Head MRI/ MRA 01/27/2013. Neck MRA 10/13/2011.  FINDINGS: CTA NECK  Aortic arch: 3 vessel aortic arch. The brachiocephalic and subclavian arteries are patent without  stenosis.  Right carotid system: Mild, predominantly noncalcified plaque is present at the right carotid bifurcation. No common carotid or cervical internal carotid artery stenosis.  Left carotid system: Mild calcified and noncalcified plaque at the carotid bifurcation without common carotid or cervical internal carotid artery stenosis.  Vertebral arteries:Vertebral arteries are patent without stenosis. Left vertebral artery is mildly dominant.  Skeleton: Moderate to severe multilevel cervical disc degeneration.  Other neck: The thyroid gland is heterogeneous and diffusely enlarged, more so in the right lobe. Multiple nodules are present. Dominant right-sided nodule measures 3.0 cm. There is a 2.0 cm nodule in the isthmus.  CTA HEAD  Anterior circulation: Internal carotid arteries are patent from skullbase to carotid termini. There is mild carotid siphon calcification bilaterally without significant stenosis. There is a distal M1 occlusion on the right. Contrast is seen within M2 and more distal MCA branch vessels, likely via collateral flow, however the number of MCA branch vessels is mildly diminished compared to the contralateral side. Left MCA and bilateral ACAs are  unremarkable. No intracranial aneurysm is identified.  Posterior circulation: The intracranial vertebral arteries are patent to the basilar with the left being dominant. PICA origins and SCA origins are patent. Basilar artery is patent without stenosis. There are patent posterior communicating arteries bilaterally, right larger than left, and the right P1 segment is mildly hypoplastic. PCAs are otherwise unremarkable.  Venous sinuses: Patent.  Anatomic variants: Hypoplastic right P1.  Delayed phase: No abnormal brain parenchymal or meningeal enhancement. Asymmetric enhancement of right MCA branch vessels may reflect slow flow and collateral flow. Subtle asymmetric hypoattenuation in the right corona radiata and external capsule may reflect developing acute infarct.  CT PERFUSION  Cerebral perfusion images demonstrate abnormally prolonged mean transit time and time to peak throughout essentially the entirety of the right MCA territory with corresponding decreased cerebral blood flow. Cerebral blood volume is maintained throughout much of this region, with some cortical/subcortical regions demonstrating slightly elevated CBV compared to the contralateral side. The deep white matter of the right corona radiata and centrum semiovale, particularly posteriorly, as well as posterior temporal lobe demonstrate more abnormal MTT and CBF parameters than the remainder of the MCA territory as well as slightly diminished CBV compared to the contralateral side and may reflect a developing core infarct in a deep watershed distribution. The basal ganglia are largely spared.  IMPRESSION: 1. Distal right M1 occlusion. 2. Abnormal perfusion throughout essentially the entirety of the right MCA territory. Much of this appears to represent oligemic brain with relatively preserved cerebral blood volume, however there is likely a developing region of core infarct involving the deep white matter.  Preliminary results were discussed in person  at the time of interpretation on 09/05/2014 at 10:45 am with Dr. Dorian Pod , who verbally acknowledged these results.   Electronically Signed   By: Logan Bores   On: 09/05/2014 11:35   Ct Head Wo Contrast  09/09/2014   CLINICAL DATA:  Followup intracranial hemorrhage.  EXAM: CT HEAD WITHOUT CONTRAST  TECHNIQUE: Contiguous axial images were obtained from the base of the skull through the vertex without intravenous contrast.  COMPARISON:  09/05/2014  FINDINGS: Skull and Sinuses:Progressive opacification of the left sphenoid sinus, likely related or exacerbated by a nasogastric tube that was previously noted  Orbits: No acute abnormality.  Brain: Many of the recently demonstrated acute infarcts have become visible due to cytotoxic edema, with cm or smaller infarcts noted in the bilateral cerebellum, bilateral centrum semiovale and subcortical  white matter. The largest infarct, located in the right putamen, is again noted with unchanged amorphous hemorrhage measuring up to 13 mm. There is no evidence of new infarct. No hydrocephalus, mass lesion, or shift.  IMPRESSION: 1. Expected evolution of embolic cerebral and cerebellar infarcts. Hemorrhagic conversion in the right putamen is stable; no evidence of new ischemia. 2. Progressive left sphenoid sinusitis, likely related to prior nasogastric tube.   Electronically Signed   By: Monte Fantasia M.D.   On: 09/09/2014 08:22   Ct Head Wo Contrast  09/05/2014   CLINICAL DATA:  Stroke. Left-sided weakness. Right MCA occlusion status post cerebral angiogram and revascularization.  EXAM: CT HEAD WITHOUT CONTRAST  TECHNIQUE: Contiguous axial images were obtained from the base of the skull through the vertex without intravenous contrast.  COMPARISON:  Head CT earlier today  FINDINGS: There is some residual intravascular contrast present from recent catheter angiogram. There is mild contrast staining in the right basal ganglia. Subtly asymmetric hypoattenuation in the  posterior right centrum semiovale and corona radiata is unchanged and could reflect early acute ischemic change or asymmetric chronic ischemia. Patchy hypodensities more anteriorly in the centrum semiovale/ corona radiata bilaterally are also unchanged and appear more chronic in nature. No definite acute large territory cortical infarct is identified. There is no evidence of acute intracranial hemorrhage, mass, midline shift, or extra-axial fluid collection. Mild generalized cerebral atrophy is noted.  Orbits are unremarkable. Visualized mastoid air cells are clear. Left sphenoid sinus fluid has mildly increased. Endotracheal and enteric tubes are partially visualized.  IMPRESSION: Sequelae of interval cerebral angiography and right MCA revascularization. No acute hemorrhage.   Electronically Signed   By: Logan Bores   On: 09/05/2014 14:41   Ct Head Wo Contrast  09/05/2014   CLINICAL DATA:  Left-sided weakness beginning earlier this morning. Post fall.  EXAM: CT HEAD WITHOUT CONTRAST  CT CERVICAL SPINE WITHOUT CONTRAST  TECHNIQUE: Multidetector CT imaging of the head and cervical spine was performed following the standard protocol without intravenous contrast. Multiplanar CT image reconstructions of the cervical spine were also generated.  COMPARISON:  Head CT - 12/18/2012; brain MRI - 01/27/2013; thyroid ultrasound - 08/17/2010  FINDINGS: CT HEAD FINDINGS  Examination is minimally degraded due to obliquity. Interval development of an ill-defined area of hypoattenuation within the bilateral centrum semiovale, right greater than left (representative images 18 through 20, series 201). These areas appear at least subacute given lack of adjacent vasogenic edema. The gray-white differentiation is otherwise well maintained. Similar findings of atrophy with bifrontal sulcal prominence. Unchanged size and configuration of the ventricles and basilar cisterns given obliquity. No midline shift. Intracranial atherosclerosis.   There is minimal mucosal thickening within the bilateral sphenoid sinuses. The remaining paranasal sinuses mastoid air cells are normally aerated. No air-fluid levels. Regional soft tissues appear normal. No displaced calvarial fracture.  CT CERVICAL SPINE FINDINGS  C1 to the superior endplate of T3 is imaged.  Normal alignment of the cervical spine given the head being held in a minimal amount of left lateral flexion. No anterolisthesis or retrolisthesis. The bilateral facets are normally aligned. The dens is normally positioned between the lateral masses of C1. Moderate degenerative change of the atlantodental articulation  Cervical vertebral body heights are preserved. Prevertebral soft tissues are normal.  There is moderate to severe multilevel cervical spine DDD, worse at C4-C5, C5-C6 and C6-C7 with disc space height loss, endplate irregularity and posteriorly directed disc osteophyte complexes at these locations.  There is asymmetric  enlargement of the right lobe of the thyroid and the thyroid isthmus with note made of an approximately 1.1 cm hyper attenuating nodule within the posterior aspect the right lobe of the thyroid (image 65, series 302) as well as an approximately 2.0 x 1.8 cm nodule within the thyroid isthmus (image 78).  Regional soft tissues appear otherwise normal. Limited visualization of lung apices is normal.  IMPRESSION: 1. Interval development of ill-defined hypo densities within the bilateral centrum semiovale, right greater than left, favored to represent progressive age-indeterminate though at least subacute microvascular ischemic disease continued since remote examination performed 11/2012. Otherwise, no definite acute intracranial process. 2. No fracture or static subluxation of the cervical spine. 3. Moderate to severe multilevel cervical spine DDD, worse at C4-C5, C5-C6 and C6-C7. 4. Asymmetric enlargement of the right lobe of the thyroid and thyroid isthmus with suspected thyroid  nodules as detailed above. While these nodules appear grossly similar to compared to the 07/2010 thyroid ultrasound, further evaluation could be performed with repeat dedicated non emergent thyroid ultrasound as clinically indicated. Above findings discussed with Dr. Aram Beecham at 02/2012.   Electronically Signed   By: Sandi Mariscal M.D.   On: 09/05/2014 08:39   Ct Angio Neck W/cm &/or Wo/cm  09/05/2014   CLINICAL DATA:  Left-sided paralysis.  Acute stroke.  EXAM: CT ANGIOGRAPHY HEAD AND NECK  CT CEREBRAL PERFUSION  TECHNIQUE: Multidetector CT imaging of the head and neck was performed using the standard protocol during bolus administration of intravenous contrast. Multiplanar CT image reconstructions and MIPs were obtained to evaluate the vascular anatomy. Carotid stenosis measurements (when applicable) are obtained utilizing NASCET criteria, using the distal internal carotid diameter as the denominator. CT cerebral perfusion was performed during bolus IV contrast injection.  CONTRAST:  129mL OMNIPAQUE IOHEXOL 350 MG/ML SOLN  COMPARISON:  Head CT earlier today. Head MRI/ MRA 01/27/2013. Neck MRA 10/13/2011.  FINDINGS: CTA NECK  Aortic arch: 3 vessel aortic arch. The brachiocephalic and subclavian arteries are patent without stenosis.  Right carotid system: Mild, predominantly noncalcified plaque is present at the right carotid bifurcation. No common carotid or cervical internal carotid artery stenosis.  Left carotid system: Mild calcified and noncalcified plaque at the carotid bifurcation without common carotid or cervical internal carotid artery stenosis.  Vertebral arteries:Vertebral arteries are patent without stenosis. Left vertebral artery is mildly dominant.  Skeleton: Moderate to severe multilevel cervical disc degeneration.  Other neck: The thyroid gland is heterogeneous and diffusely enlarged, more so in the right lobe. Multiple nodules are present. Dominant right-sided nodule measures 3.0 cm. There is a 2.0  cm nodule in the isthmus.  CTA HEAD  Anterior circulation: Internal carotid arteries are patent from skullbase to carotid termini. There is mild carotid siphon calcification bilaterally without significant stenosis. There is a distal M1 occlusion on the right. Contrast is seen within M2 and more distal MCA branch vessels, likely via collateral flow, however the number of MCA branch vessels is mildly diminished compared to the contralateral side. Left MCA and bilateral ACAs are unremarkable. No intracranial aneurysm is identified.  Posterior circulation: The intracranial vertebral arteries are patent to the basilar with the left being dominant. PICA origins and SCA origins are patent. Basilar artery is patent without stenosis. There are patent posterior communicating arteries bilaterally, right larger than left, and the right P1 segment is mildly hypoplastic. PCAs are otherwise unremarkable.  Venous sinuses: Patent.  Anatomic variants: Hypoplastic right P1.  Delayed phase: No abnormal brain parenchymal or meningeal enhancement.  Asymmetric enhancement of right MCA branch vessels may reflect slow flow and collateral flow. Subtle asymmetric hypoattenuation in the right corona radiata and external capsule may reflect developing acute infarct.  CT PERFUSION  Cerebral perfusion images demonstrate abnormally prolonged mean transit time and time to peak throughout essentially the entirety of the right MCA territory with corresponding decreased cerebral blood flow. Cerebral blood volume is maintained throughout much of this region, with some cortical/subcortical regions demonstrating slightly elevated CBV compared to the contralateral side. The deep white matter of the right corona radiata and centrum semiovale, particularly posteriorly, as well as posterior temporal lobe demonstrate more abnormal MTT and CBF parameters than the remainder of the MCA territory as well as slightly diminished CBV compared to the contralateral  side and may reflect a developing core infarct in a deep watershed distribution. The basal ganglia are largely spared.  IMPRESSION: 1. Distal right M1 occlusion. 2. Abnormal perfusion throughout essentially the entirety of the right MCA territory. Much of this appears to represent oligemic brain with relatively preserved cerebral blood volume, however there is likely a developing region of core infarct involving the deep white matter.  Preliminary results were discussed in person at the time of interpretation on 09/05/2014 at 10:45 am with Dr. Dorian Pod , who verbally acknowledged these results.   Electronically Signed   By: Logan Bores   On: 09/05/2014 11:35   Ct Cervical Spine Wo Contrast  09/05/2014   CLINICAL DATA:  Left-sided weakness beginning earlier this morning. Post fall.  EXAM: CT HEAD WITHOUT CONTRAST  CT CERVICAL SPINE WITHOUT CONTRAST  TECHNIQUE: Multidetector CT imaging of the head and cervical spine was performed following the standard protocol without intravenous contrast. Multiplanar CT image reconstructions of the cervical spine were also generated.  COMPARISON:  Head CT - 12/18/2012; brain MRI - 01/27/2013; thyroid ultrasound - 08/17/2010  FINDINGS: CT HEAD FINDINGS  Examination is minimally degraded due to obliquity. Interval development of an ill-defined area of hypoattenuation within the bilateral centrum semiovale, right greater than left (representative images 18 through 20, series 201). These areas appear at least subacute given lack of adjacent vasogenic edema. The gray-white differentiation is otherwise well maintained. Similar findings of atrophy with bifrontal sulcal prominence. Unchanged size and configuration of the ventricles and basilar cisterns given obliquity. No midline shift. Intracranial atherosclerosis.  There is minimal mucosal thickening within the bilateral sphenoid sinuses. The remaining paranasal sinuses mastoid air cells are normally aerated. No air-fluid levels.  Regional soft tissues appear normal. No displaced calvarial fracture.  CT CERVICAL SPINE FINDINGS  C1 to the superior endplate of T3 is imaged.  Normal alignment of the cervical spine given the head being held in a minimal amount of left lateral flexion. No anterolisthesis or retrolisthesis. The bilateral facets are normally aligned. The dens is normally positioned between the lateral masses of C1. Moderate degenerative change of the atlantodental articulation  Cervical vertebral body heights are preserved. Prevertebral soft tissues are normal.  There is moderate to severe multilevel cervical spine DDD, worse at C4-C5, C5-C6 and C6-C7 with disc space height loss, endplate irregularity and posteriorly directed disc osteophyte complexes at these locations.  There is asymmetric enlargement of the right lobe of the thyroid and the thyroid isthmus with note made of an approximately 1.1 cm hyper attenuating nodule within the posterior aspect the right lobe of the thyroid (image 65, series 302) as well as an approximately 2.0 x 1.8 cm nodule within the thyroid isthmus (image 78).  Regional  soft tissues appear otherwise normal. Limited visualization of lung apices is normal.  IMPRESSION: 1. Interval development of ill-defined hypo densities within the bilateral centrum semiovale, right greater than left, favored to represent progressive age-indeterminate though at least subacute microvascular ischemic disease continued since remote examination performed 11/2012. Otherwise, no definite acute intracranial process. 2. No fracture or static subluxation of the cervical spine. 3. Moderate to severe multilevel cervical spine DDD, worse at C4-C5, C5-C6 and C6-C7. 4. Asymmetric enlargement of the right lobe of the thyroid and thyroid isthmus with suspected thyroid nodules as detailed above. While these nodules appear grossly similar to compared to the 07/2010 thyroid ultrasound, further evaluation could be performed with repeat  dedicated non emergent thyroid ultrasound as clinically indicated. Above findings discussed with Dr. Aram Beecham at 02/2012.   Electronically Signed   By: Sandi Mariscal M.D.   On: 09/05/2014 08:39   Mr Brain Wo Contrast  09/06/2014   CLINICAL DATA:  Stroke. Right MCA occlusion with clot removal 09/05/2014.  EXAM: MRI HEAD WITHOUT CONTRAST  TECHNIQUE: Multiplanar, multiecho pulse sequences of the brain and surrounding structures were obtained without intravenous contrast.  COMPARISON:  CT 09/05/2014  FINDINGS: Multiple areas of acute infarct are identified. Scattered areas of acute infarct in the right MCA territory involving the right insula, right posterior basal ganglia, and right parietal lobe. Mild amount of hemorrhage in the right posterior putamen.  Small areas of acute infarct in the left parietal cortex. Acute infarct in the PICA territory bilaterally. Small area of acute infarct in the left occipital lobe.  This pattern suggests cerebral emboli.  Ventricle size is normal. No shift of the midline structures. Negative for mass lesion.  Air-fluid level in the sphenoid sinus. Maxillary sinuses are clear bilaterally.  IMPRESSION: Multiple areas of acute infarct are present indicative of cerebral emboli.  Postop re- canalization of the right MCA with a small amount of hemorrhage in the right putamen. There are scattered areas of infarct involving the right insula and right parietal lobe however there is no lobar right MCA infarct.   Electronically Signed   By: Franchot Gallo M.D.   On: 09/06/2014 18:33   Dg Pelvis Portable  09/05/2014   CLINICAL DATA:  Fall with right hip pain.  EXAM: PORTABLE PELVIS 1-2 VIEWS  COMPARISON:  12/24/2012  FINDINGS: Note that the very lateral aspect of the right trochanteric region is not imaged. There are mild symmetric degenerative changes of the hips unchanged. No evidence of acute fracture or dislocation. There are degenerative changes of the spine. There is mild fecal retention  over the rectum.  IMPRESSION: No acute fracture or dislocation. Note that the lateral aspect of the trochanteric region of the right femur is not completely imaged. Recommend an additional cone-down view of the right hip for complete evaluation.   Electronically Signed   By: Marin Olp M.D.   On: 09/05/2014 10:57   Ct Cerebral Perfusion W/cm  09/05/2014   CLINICAL DATA:  Left-sided paralysis.  Acute stroke.  EXAM: CT ANGIOGRAPHY HEAD AND NECK  CT CEREBRAL PERFUSION  TECHNIQUE: Multidetector CT imaging of the head and neck was performed using the standard protocol during bolus administration of intravenous contrast. Multiplanar CT image reconstructions and MIPs were obtained to evaluate the vascular anatomy. Carotid stenosis measurements (when applicable) are obtained utilizing NASCET criteria, using the distal internal carotid diameter as the denominator. CT cerebral perfusion was performed during bolus IV contrast injection.  CONTRAST:  142mL OMNIPAQUE IOHEXOL 350 MG/ML SOLN  COMPARISON:  Head CT earlier today. Head MRI/ MRA 01/27/2013. Neck MRA 10/13/2011.  FINDINGS: CTA NECK  Aortic arch: 3 vessel aortic arch. The brachiocephalic and subclavian arteries are patent without stenosis.  Right carotid system: Mild, predominantly noncalcified plaque is present at the right carotid bifurcation. No common carotid or cervical internal carotid artery stenosis.  Left carotid system: Mild calcified and noncalcified plaque at the carotid bifurcation without common carotid or cervical internal carotid artery stenosis.  Vertebral arteries:Vertebral arteries are patent without stenosis. Left vertebral artery is mildly dominant.  Skeleton: Moderate to severe multilevel cervical disc degeneration.  Other neck: The thyroid gland is heterogeneous and diffusely enlarged, more so in the right lobe. Multiple nodules are present. Dominant right-sided nodule measures 3.0 cm. There is a 2.0 cm nodule in the isthmus.  CTA HEAD   Anterior circulation: Internal carotid arteries are patent from skullbase to carotid termini. There is mild carotid siphon calcification bilaterally without significant stenosis. There is a distal M1 occlusion on the right. Contrast is seen within M2 and more distal MCA branch vessels, likely via collateral flow, however the number of MCA branch vessels is mildly diminished compared to the contralateral side. Left MCA and bilateral ACAs are unremarkable. No intracranial aneurysm is identified.  Posterior circulation: The intracranial vertebral arteries are patent to the basilar with the left being dominant. PICA origins and SCA origins are patent. Basilar artery is patent without stenosis. There are patent posterior communicating arteries bilaterally, right larger than left, and the right P1 segment is mildly hypoplastic. PCAs are otherwise unremarkable.  Venous sinuses: Patent.  Anatomic variants: Hypoplastic right P1.  Delayed phase: No abnormal brain parenchymal or meningeal enhancement. Asymmetric enhancement of right MCA branch vessels may reflect slow flow and collateral flow. Subtle asymmetric hypoattenuation in the right corona radiata and external capsule may reflect developing acute infarct.  CT PERFUSION  Cerebral perfusion images demonstrate abnormally prolonged mean transit time and time to peak throughout essentially the entirety of the right MCA territory with corresponding decreased cerebral blood flow. Cerebral blood volume is maintained throughout much of this region, with some cortical/subcortical regions demonstrating slightly elevated CBV compared to the contralateral side. The deep white matter of the right corona radiata and centrum semiovale, particularly posteriorly, as well as posterior temporal lobe demonstrate more abnormal MTT and CBF parameters than the remainder of the MCA territory as well as slightly diminished CBV compared to the contralateral side and may reflect a developing core  infarct in a deep watershed distribution. The basal ganglia are largely spared.  IMPRESSION: 1. Distal right M1 occlusion. 2. Abnormal perfusion throughout essentially the entirety of the right MCA territory. Much of this appears to represent oligemic brain with relatively preserved cerebral blood volume, however there is likely a developing region of core infarct involving the deep white matter.  Preliminary results were discussed in person at the time of interpretation on 09/05/2014 at 10:45 am with Dr. Dorian Pod , who verbally acknowledged these results.   Electronically Signed   By: Logan Bores   On: 09/05/2014 11:35   Dg Chest Port 1 View  09/05/2014   CLINICAL DATA:  Code stroke. History of TIA, hypertension and diabetes.  EXAM: PORTABLE CHEST - 1 VIEW  COMPARISON:  08/14/2013; 10/13/2011  FINDINGS: Grossly unchanged cardiac silhouette and mediastinal contours. No focal parenchymal opacities. No pleural effusion or pneumothorax. No evidence of edema. No acute osseus abnormalities.  IMPRESSION: No acute cardiopulmonary disease.   Electronically Signed  By: Sandi Mariscal M.D.   On: 09/05/2014 08:53   Dg Chest Port 1v Same Day  09/05/2014   CLINICAL DATA:  Respiratory failure.  EXAM: PORTABLE CHEST - 1 VIEW SAME DAY  COMPARISON:  09/05/2014 at 0838 hr.  FINDINGS: Endotracheal tube is in satisfactory position, terminating 5.1 cm above the carina. Nasogastric tube is followed into the stomach. Lungs are clear. A skin fold projects over the upper left hemi thorax. No pleural fluid.  IMPRESSION: No acute findings.   Electronically Signed   By: Lorin Picket M.D.   On: 09/05/2014 19:00   Dg Swallowing Func-speech Pathology  09/08/2014   Orbie Pyo [place], CCC-SLP     09/08/2014 12:26 PM  Objective Swallowing Evaluation: Modified Barium Swallowing Study   Patient Details  Name: Christy Simmons MRN: [id number] Date of Birth: 1934-10-23  Today's Date: 09/08/2014 Time: SLP Start Time (ACUTE ONLY): 1045-SLP Stop Time  (ACUTE  ONLY): 1105 SLP Time Calculation (min) (ACUTE ONLY): 20 min  Past Medical History:  Past Medical History  Diagnosis Date  . Transient ischemic attack   . HTN (hypertension)   . Hyperlipidemia   . DM (diabetes mellitus)   . GERD (gastroesophageal reflux disease)   . Heart disease     PFO  . Amnesia 01/10/2013  . Patent foramen ovale with atrial septal aneurysm   . Complication of anesthesia   . PONV (postoperative nausea and vomiting)   . Family history of anesthesia complication     "My brother has trouble waking up."  . Glaucoma     Hx: of  . Early cataracts, bilateral     Hx: of  . Arthritis    Past Surgical History:  Past Surgical History  Procedure Laterality Date  . US echocardiography  10-13-11    EF 60-65% normal  . Back surgery      disc   . Tonsillectomy    . Tee without cardioversion  10/30/2011    Procedure: TRANSESOPHAGEAL ECHOCARDIOGRAM (TEE);  Surgeon:  Larey Dresser, MD;  Location: [place];  Service:  Cardiovascular;  Laterality: N/A;  . Colonoscopy w/ biopsies and polypectomy      HX;of  . Tonsillectomy    . Tubal ligation    . Lumbar laminectomy/decompression microdiscectomy N/A 09/11/2013    Procedure: LUMBAR LAMINECTOMY/DECOMPRESSION MICRODISCECTOMY 2  LEVELS;  Surgeon: Ophelia Charter, MD;  Location: [facility];   Service: Neurosurgery;  Laterality: N/A;  Lumbar Four-Five  Laminectomy and Foraminotomy with redo Lumbar Five-Sacral One  diskectomy  . Radiology with anesthesia N/A 09/05/2014    Procedure: RADIOLOGY WITH ANESTHESIA;  Surgeon: Rob Hickman, MD;  Location: [place];  Service: Radiology;   Laterality: N/A;   HPI:  HPI: 80 y.o. female with a past medical history significant for  HTN, hyperlipidemia, DM, TIA, PFO with atrial septal aneurysm  admitted with left hemiparesis and decreased responsiveness. MRI  showed Multiple areas of acute infarct are present indicative of  cerebral emboli, right MCA branch infarcts including the right  insula and basal ganglia with small  hemorrhagic transformation in  the right basal ganglis. In addition, infarcts in the left  parietal lobe, bilateral PICA and left occipital lobe.  Underwent  cerebral angiogram with complete revascularization of occluded RT  MCA 2/6. Intubated 2/6-2/7. MBS recommended following bedside  swallow assessment.    No Data Recorded  Assessment / Plan / Recommendation CHL IP CLINICAL IMPRESSIONS 09/08/2014 Dysphagia Diagnosis Mild  oral phase dysphagia;Moderate oral  phase dysphagia;Mild  pharyngeal phase dysphagia  Clinical impression Pt exhibits a mild-moderate oral dyspahgia  due to motor weakness leading to mild lingual residue and  decreased mandibular excursion and delayed transit with solid and  mechanical soft textures. Pharyngeal phase characterized by  primarily motor impairments of decreased laryngeal elevation and  incomplete closure with penetration of nectar barium (decreased  sensation). No significant pharyngeal residue. Pt's demonstrating  fatigue and decreased endurance throughout study increasing  aspiration risk. Throat clearing present during when barium was  not observed in laryngeal vestibule. Recommend Dys 1 diet and  honey thick liquids, full supervision, crush pills, no straws. ST  will continue intervention.       CHL IP TREATMENT RECOMMENDATION 09/08/2014  Treatment Plan Recommendations Therapy as outlined in treatment  plan below     CHL IP DIET RECOMMENDATION 09/08/2014  Diet Recommendations Dysphagia 1 (Puree);Honey-thick liquid  Liquid Administration via Cup;No straw  Medication Administration Crushed with puree  Compensations Slow rate;Small sips/bites  Postural Changes and/or Swallow Maneuvers Seated upright 90  degrees;Upright 30-60 min after meal     CHL IP OTHER RECOMMENDATIONS 09/08/2014  Recommended Consults (None)  Oral Care Recommendations Oral care BID  Other Recommendations Order thickener from pharmacy     CHL IP FOLLOW UP RECOMMENDATIONS 09/08/2014  Follow up Recommendations (No Data)      CHL IP FREQUENCY AND DURATION 09/08/2014  Speech Therapy Frequency (ACUTE ONLY) min 2x/week  Treatment Duration 2 weeks     Pertinent Vitals/Pain Pt reported no pain since received pain  meds this morning    SLP Swallow Goals No flowsheet data found.  No flowsheet data found.    CHL IP REASON FOR REFERRAL 09/08/2014  Reason for Referral Objectively evaluate swallowing function     CHL IP ORAL PHASE 09/08/2014  Lips (None)  Tongue (None)  Mucous membranes (None)  Nutritional status (None)  Other (None)  Oxygen therapy (None)  Oral Phase Impaired  Oral - Pudding Teaspoon (None)  Oral - Pudding Cup (None)  Oral - Honey Teaspoon (None)  Oral - Honey Cup Lingual/palatal residue  Oral - Honey Syringe (None)  Oral - Nectar Teaspoon (None)  Oral - Nectar Cup (None)  Oral - Nectar Straw (None)  Oral - Nectar Syringe (None)  Oral - Ice Chips (None)  Oral - Thin Teaspoon (None)  Oral - Thin Cup (None)  Oral - Thin Straw (None)  Oral - Thin Syringe (None)  Oral - Puree (None)  Oral - Mechanical Soft Delayed oral transit;Weak lingual  manipulation  Oral - Regular Weak lingual manipulation;Delayed oral transit  Oral - Multi-consistency (None)  Oral - Pill (None)  Oral Phase - Comment (None)      CHL IP PHARYNGEAL PHASE 09/08/2014  Pharyngeal Phase Impaired  Pharyngeal - Pudding Teaspoon (None)  Penetration/Aspiration details (pudding teaspoon) (None)  Pharyngeal - Pudding Cup (None)  Penetration/Aspiration details (pudding cup) (None) Pharyngeal -  Honey Teaspoon (None)  Penetration/Aspiration details (honey teaspoon) (None)  Pharyngeal - Honey Cup [facility]  Penetration/Aspiration details (honey cup) (None)  Pharyngeal - Honey Syringe (None)  Penetration/Aspiration details (honey syringe) (None)  Pharyngeal - Nectar Teaspoon WFL  Penetration/Aspiration details (nectar teaspoon) (None)  Pharyngeal - Nectar Cup Penetration/Aspiration during  swallow;Reduced laryngeal elevation;Reduced airway/laryngeal  closure  Penetration/Aspiration details  (nectar cup) Material enters  airway, remains ABOVE vocal cords and not ejected out  Pharyngeal - Nectar Straw (None)  Penetration/Aspiration details (nectar straw) (None)  Pharyngeal - Nectar Syringe (None)  Penetration/Aspiration details (  nectar syringe) (None)  Pharyngeal - Ice Chips (None)  Penetration/Aspiration details (ice chips) (None)  Pharyngeal - Thin Teaspoon (None)  Penetration/Aspiration details (thin teaspoon) (None) Pharyngeal  - Thin Cup (None)  Penetration/Aspiration details (thin cup) (None)  Pharyngeal - Thin Straw (None)  Penetration/Aspiration details (thin straw) (None)  Pharyngeal - Thin Syringe (None)  Penetration/Aspiration details (thin syringe') (None)  Pharyngeal - Puree Delayed swallow initiation;Premature spillage  to valleculae  Penetration/Aspiration details (puree) (None)  Pharyngeal - Mechanical Soft WFL  Penetration/Aspiration details (mechanical soft) (None)  Pharyngeal - Regular WFL  Penetration/Aspiration details (regular) (None)  Pharyngeal - Multi-consistency (None)  Penetration/Aspiration details (multi-consistency) (None)  Pharyngeal - Pill (None)  Penetration/Aspiration details (pill) (None)  Pharyngeal Comment (None)     CHL IP CERVICAL ESOPHAGEAL PHASE 09/08/2014  Cervical Esophageal Phase WFL  Pudding Teaspoon (None)  Pudding Cup (None)  Honey Teaspoon (None)  Honey Cup (None)  Honey Syringe (None)  Nectar Teaspoon (None)  Nectar Cup (None)  Nectar Straw (None)  Nectar Syringe (None) Thin Teaspoon (None)  Thin Cup (None)  Thin Straw (None)  Thin Syringe (None)  Cervical Esophageal Comment (None)    No flowsheet data found.         Houston Siren 09/08/2014, 12:24 PM   Orbie Pyo [place].Ed [employer] Pager [phone]  Orbie Pyo [place].Ed CCC-SLP Pager [phone]        Ir Percutaneous Art Thrombectomy/infusion Intracranial Inc Diag Angio  09/08/2014   CLINICAL DATA:  Acute onset of left-sided weakness, right gaze deviation.  EXAM: RIGHT COMMON CAROTID ARTERY AND  RIGHT VERTEBRAL ARTERY ARTERIOGRAMS FOLLOWED BY ENDOVASCULAR COMPLETE REVASCULARIZATION OF THE OCCLUDED RIGHT MIDDLE CEREBRAL ARTERY M1 SEGMENT USING SUPERSELECTIVE INTRACRANIAL INTRA-ARTERIAL 6 MG OF INTEGRILIN AND ONE PASS WITH THE SOLITAIRE FR STENT RETRIEVAL DEVICE. IR PERCUTANEOUS ART THORMBECTOMY/INFUSION INTRACRANIAL INCLUDE DIAG ANGIO  PROCEDURE: Contrast: 134mL OMNIPAQUE IOHEXOL 300 MG/ML  SOLN  Anesthesia/Sedation:  General anesthesia.  Medications: As per general anesthesia.  Following a full explanation of the procedure along with the potential associated complications, an informed witnessed consent was obtained from patient's husband.  The procedure, the risks of intracranial hemorrhage of 10-15%, worsening neurological deficit, need for ventilator dependency, and inability to revascularize and death were all discussed in detail with the patient's husband. Informed consent was obtained.  The patient was put under general anesthesia by the Department of Anesthesiology at [facility].  The right groin was prepped and draped in the usual sterile fashion. Thereafter using modified Seldinger technique, transfemoral access into the right common femoral artery was obtained without difficulty. Over a 0.035 inch guidewire, a 5 French Pinnacle sheath was inserted. Through this, and also over a 0.035 inch guidewire, a 5 French JB1 catheter was advanced to the aortic arch region and selectively positioned in the innominate artery and the right common carotid artery.  There were no acute complications. The patient tolerated the procedure well.  FINDINGS: The origin of the right vertebral artery is normal.  There is moderate narrowing of the right vertebral artery just distal to its origin, however. More distally the vessel is seen to opacify to the cranial skull base. Opacification is seen of the right vertebrobasilar junction although the right posterior-inferior cerebral artery is suboptimally visualized.   The right common carotid arteriogram demonstrates the right external carotid artery and its major branches to be normal.  The right internal carotid artery at the bulb to the cranial skull base opacifies normally.  The petrous segment, the cavernous  segment and the supraclinoid segments are widely patent.  The right middle cerebral artery demonstrates a complete truncation in the mid right M1 level. The right anterior cerebral artery is seen to opacify into the capillary and venous phases.  Transient cross opacification via the anterior communicating artery of the left anterior cerebral artery A2 segment and partially the A1 segment is seen.  Additionally noted is the opacification of the left posterior cerebral artery via the left posterior communicating artery.  The delayed arterial images demonstrate retrograde opacification of the distal parietal, the parietal subcortical and the perisylvian branches filling retrogradely from the callosal marginal and the pericallosal branches.  ENDOVASCULAR REVASCULARIZATION OF OCCLUDED RIGHT MIDDLE CEREBRAL ARTERY.  The diagnostic JB1 catheter in the right common carotid artery was exchanged over a 0.035 inch 300 cm Rosen exchange guidewire for an 8 French 55 cm Brite Tip neurovascular sheath using biplane roadmap technique and constant fluoroscopic guidance. Good aspiration was obtained from the side port of the neurovascular sheath. A gentle constant injection demonstrated no evidence of spasms, dissections or of intraluminal filling defects. This was then connected to continuous heparinized saline infusion. Over the [employer] guidewire, an 8 [place] 85 cm FlowGate balloon guide catheter which had been prepped with 50% contrast and 50% heparinized saline infusion was then advanced and positioned just proximal to the right common carotid bifurcation. The guidewire was removed. Good aspiration was obtained from the hub of the 8 French balloon guide catheter.  A gentle  constant injection demonstrated no evidence of spasms, dissections or of intraluminal filling defects.  Over a 0.035 inch Roadrunner guidewire, using biplane roadmap technique and constant fluoroscopic guidance, the 8 French balloon guide catheter was then advanced to the distal cervical segment of the right internal carotid artery. The guidewire was removed. Good aspiration was obtained from the hub of the 8 French balloon guide catheter. A gentle contrast injection demonstrated no evidence of spasms, dissections or of intraluminal filling defects.  A control arteriogram performed intracranially demonstrated no change in the occluded right middle cerebral artery mid M1 segment.  At this time in a coaxial manner and with constant heparinized saline infusion using biplane roadmap technique and constant fluoroscopic guidance, a combination of a TrevoProvue micro catheter inside of a 125 cm DAC catheter was advanced over a 0.014 inch Softip Synchro micro guidewire to the distal end of the 8 French balloon guide catheter and the distal cervical right ICA. With the micro guidewire leading with a J-tip configuration, the combination was then navigated using a torque device into the supraclinoid right ICA. The micro guidewire was then gently advanced through the occluded right middle cerebral artery into the dominant inferior division of the right middle cerebral artery followed by the micro catheter combination. The guidewire was removed. There was good aspiration from the hub of the TrevoProvue micro catheter in the M2 M3 region of the inferior division of the right middle cerebral artery. At this juncture because of the slow distal flow, 3 mg of superselective intracranial intra-arterial Integrilin was then infused over approximately 2 minutes.  During this time the micro catheter was retrieved somewhat proximally to engage the clot itself before being readvanced over a micro guidewire to the M2 M3 region.  At this  time, a 4 mm x 40 mm Solitaire FR stent retrieval device which had been prepped and purged with heparinized saline fusion in its housing was then advanced to the distal end of the micro catheter. The O rings on the  delivery micro catheter and the delivery micro guidewire were then loosened. With slight forward gentle traction with the right hand on the delivery micro guidewire, with the left hand, the stent retrieval device was then unsheathed to just proximal to its proximal portion.  Once there this was left deployed for approximately 2.5 minutes. During this time, approximately 3 mg of superselective intracranial Integrilin was then infused over approximately 2 minutes through the [facility] 125 catheter.  A control arteriogram performed through the 8 French balloon guide catheter demonstrated revascularization of the previously occluded right middle cerebral artery proximally. Filling defect noted in the right MCA proximally represented the clot. The balloon was then inflated in the right internal carotid artery of the guide catheter. Using constant aspiration with a 60 mL syringe, after having retrieved the proximal portion of the retrieval device into the micro catheter, the combination of the retrieval device, and the micro catheter was then gently retrieved and removed as constant aspiration was applied at the side port at the hub of the 8 [place] FlowGate guide catheter.  The aspiration was continued as the balloon was then deflated in the right internal carotid artery. Free back flow of blood was noted at the hub of the [employer] [place].  The aspirate demonstrated a clot measuring approximately 2.5 mm x 3 mm in the aspirate.  A control arteriogram performed through the 8 [place] FlowGate guide catheter demonstrated complete angiographic revascularization of the occluded right middle cerebral artery distribution. The right posterior communicating artery, the right posterior cerebral artery and the right anterior cerebral  artery remained widely patent without evidence of distal emboli. Moderate spasm was noted at the right M1 segment, this gradually responded to 3 aliquots of 25 mics of intra-arterial nitroglycerin.  Complete relief of the spasm was achieved.  A final control arteriogram performed through the 8 [place] FlowGate guide catheter in the right internal carotid artery demonstrated complete angiographic revascularization of the right middle and the right anterior cerebral artery distributions. No evidence of intracranial filling defects, dissections or occlusions were seen.  Venous flow remained normal.  There was no extravasation of contrast noted during the procedure.  The patient's hemodynamic and neurological status remained stable.  The 8 French neurovascular sheath in the balloon occlusion FlowGate guide catheter was then retrieved into the abdominal aorta and exchanged over a J-tip guidewire for a 9 French Pinnacle sheath. This was then connected to continuous heparinized saline infusion. The patient was then transported to the CT scanner for postprocedural CT scan of the brain.  IMPRESSION: Status post endovascular complete revascularization of the occluded right middle cerebral artery M1 segment achieving a TICI 3 perfusion in the right middle cerebral artery distribution using 6 mg of superselective intracranial intra-arterial Integrilin, and one pass with the Solitaire FR 4 mm x 40 mm stent retrieval device as described above.   Electronically Signed   By: Luanne Bras M.D.   On: 09/08/2014 09:42   Dg Hip Unilat With Pelvis 2-3 Views Right  09/05/2014   CLINICAL DATA:  80 year old female with right hip pain. Patient fell in bathroom earlier today.  EXAM: RIGHT HIP (WITH PELVIS) 2-3 VIEWS  COMPARISON:  None.  FINDINGS: No evidence of acute fracture or malalignment. The femoral head is located. The bones appear osteopenic. Foley catheter present within the bladder.  IMPRESSION: No evidence of acute fracture  or malalignment   Electronically Signed   By: Jacqulynn Cadet M.D.   On: 09/05/2014 12:05    Labs:  CBC:  Recent Labs  09/05/14 0744 09/05/14 0755 09/05/14 1302 09/06/14 0300  WBC 23.6*  --   --  14.6*  HGB 13.6 15.3* 12.2 10.4*  HCT 38.8 45.0 36.0 30.0*  PLT 154  --   --  130*    COAGS:  Recent Labs  09/05/14 0744  INR 1.07  APTT 28    BMP:  Recent Labs  09/05/14 0744 09/05/14 0755 09/05/14 1302 09/06/14 0300  NA 129* 130* 130* 137  K 4.4 4.5 3.8 3.5  CL 95* 97  --  106  CO2 22  --   --  24  GLUCOSE 518* 510*  --  129*  BUN 27* 32*  --  20  CALCIUM 9.0  --   --  8.2*  CREATININE 1.02 0.80  --  0.84  GFRNONAA 52*  --   --  65*  GFRAA 60*  --   --  76*    LIVER FUNCTION TESTS:  Recent Labs  09/05/14 0744  BILITOT 0.5  AST 30  ALT 23  ALKPHOS 71  PROT 6.3  ALBUMIN 3.6    Assessment and Plan: Hemorrhagic stroke B/L LE DVT 09/07/14, unable to be on anticoagulation secondary to above Request for IVC filter Patient has been NPO, labs reviewed, no iodinated contrast allergy or renal dysfunction  Risks and Benefits discussed with the patient and her family today. All questions were answered, patient's husband is agreeable to proceed. Consent signed and in chart.   Thank you for this interesting consult.  I greatly enjoyed meeting Christy Simmons and look forward to participating in their care.  SignedHedy Jacob 09/09/2014, 10:59 AM   I spent a total of 20 minutes face to face in clinical consultation, greater than 50% of which was counseling/coordinating care

## 2014-09-09 NOTE — Progress Notes (Signed)
I met with pt, husband, and daughter at bedside. I discussed the rehab process. They state pt not ready to start rehab due to the pain in her neck and her RLE. Pt receiving Dilaudid IV for pain. I contacted Burnetta Sabin with Stroke Service to discuss overall rehab needs. I will follow up tomorrow. [phone]

## 2014-09-09 NOTE — Progress Notes (Signed)
NUTRITION FOLLOW UP  Intervention:   Glucerna Shake po TID thickened to honey-thick consistency, each supplement provides 220 kcal and 10 grams of protein RD to continue to monitor  Nutrition Dx:   Malnutrition related to chronic illness as evidenced by mild/moderate muscle depletion and intake of < 75% of her needs for >/= 1 month; ongoing  Goal:   Pt to meet >/= 90% of their estimated nutrition needs; unmet  Monitor:   Diet advancement, po intake, weight trends  Assessment:   Pt admitted with right MCA occlusion s/p revascularization.   Pt's diet was advanced to Dysphagia 1 with honey-thick liquids this afternoon. Pt asleep at time of visit. Per SLP note today, pt grimacing and complaint of continued pharyngeal pain during deglutition.   Labs: glucose ranging 48 to 172 mg/dL  Lab Results  Component Value Date   HGBA1C 12.0* 09/06/2014     Height: Ht Readings from Last 1 Encounters:  09/05/14 5\' 4"  (1.626 m)    Weight Status:   Wt Readings from Last 1 Encounters:  09/06/14 122 lb 5.7 oz (55.5 kg)    Re-estimated needs:  Kcal: 1350-1600 Protein: 65-75 grams Fluid: > 1.5 L/day  Skin: closed incision on right groin  Diet Order: DIET - DYS 1, honey-thick liquids  No intake or output data in the 24 hours ending 09/09/14 1552  Last BM: 2/6   Labs:   Recent Labs Lab 09/05/14 0744 09/05/14 0755 09/05/14 1302 09/06/14 0300  NA 129* 130* 130* 137  K 4.4 4.5 3.8 3.5  CL 95* 97  --  106  CO2 22  --   --  24  BUN 27* 32*  --  20  CREATININE 1.02 0.80  --  0.84  CALCIUM 9.0  --   --  8.2*  GLUCOSE 518* 510*  --  129*    CBG (last 3)   Recent Labs  09/09/14 0500 09/09/14 0821 09/09/14 1133  GLUCAP 123* 74 86    Scheduled Meds: . aspirin  81 mg Oral Daily  . brimonidine  1 drop Both Eyes BID  . calcium-vitamin D  1 tablet Oral Q breakfast  . insulin aspart  0-15 Units Subcutaneous 6 times per day  . insulin glargine  20 Units Subcutaneous Daily   . lidocaine      . metoCLOPramide  5 mg Oral TID AC & HS  . multivitamin with minerals  1 tablet Oral Daily    Continuous Infusions: . 0.9 % NaCl with KCl 20 mEq / L 75 mL/hr at 09/09/14 0950    Pryor Ochoa RD, LDN Inpatient Clinical Dietitian Pager: [phone] After Hours Pager: [phone]

## 2014-09-09 NOTE — Progress Notes (Signed)
STROKE TEAM PROGRESS NOTE   HISTORY Christy Simmons is a 80 y.o. female with a past medical history significant for HTN, hyperlipidemia, DM, TIA, PFO with atrial septal aneurysm, brought in via EMS as a code stroke due to acute onset of left hemiparesis, decreased responsiveness. Patient was last known well around 8 pm last night. Husband stated that she hasn't been feeling well in the last month, and this morning he found her in the bathroom around 6 or 6:30 unable to move the left side and not fully responsive. He thinks that she got up to to the bathroom probably around 4 or 4:30 am. Upon arrival to the ED she was able to answer simple questions, and had NIHSS 16. CT brain showed an area of well defined area of hypodensity around the right basal ganglia.  Date last known well: 09/04/14 Time last known well: 8 pm tPA Given: no, out of the window for IV tpa, but patient taken to the IR suite for endovascular treatment. NIHSS: 16    SUBJECTIVE (INTERVAL HISTORY)  Patient not in room at present and on for IVC filter placement. Discussion with patient's brother and sister-in-law about finding of lower extremity venous clots and need to hold anticoagulation due to hemorrhagic brain infarct and hence IVC filter placement.. Repeat head CT scan from this morning does show persistent 1 cm hemorrhagic component of right basal ganglia infarct OBJECTIVE Temp:  [97.4 F (36.3 C)-100.2 F (37.9 C)] 98.6 F (37 C) (02/10 1023) Pulse Rate:  [65-80] 79 (02/10 1240) Cardiac Rhythm:  [-] Normal sinus rhythm (02/10 1100) Resp:  [10-20] 12 (02/10 1240) BP: (101-171)/(49-67) 171/67 mmHg (02/10 1240) SpO2:  [93 %-97 %] 94 % (02/10 1240)   Recent Labs Lab 09/08/14 2355 09/09/14 0423 09/09/14 0500 09/09/14 0821 09/09/14 1133  GLUCAP 103* 48* 123* 74 86    Recent Labs Lab 09/05/14 0744 09/05/14 0755 09/05/14 1302 09/06/14 0300  NA 129* 130* 130* 137  K 4.4 4.5 3.8 3.5  CL 95* 97  --  106  CO2 22   --   --  24  GLUCOSE 518* 510*  --  129*  BUN 27* 32*  --  20  CREATININE 1.02 0.80  --  0.84  CALCIUM 9.0  --   --  8.2*    Recent Labs Lab 09/05/14 0744  AST 30  ALT 23  ALKPHOS 71  BILITOT 0.5  PROT 6.3  ALBUMIN 3.6    Recent Labs Lab 09/05/14 0744 09/05/14 0755 09/05/14 1302 09/06/14 0300  WBC 23.6*  --   --  14.6*  NEUTROABS 19.4*  --   --  10.5*  HGB 13.6 15.3* 12.2 10.4*  HCT 38.8 45.0 36.0 30.0*  MCV 84.0  --   --  83.1  PLT 154  --   --  130*    Recent Labs Lab 09/05/14 0749 09/05/14 1508 09/05/14 2055 09/06/14 0300  CKTOTAL 89  --   --   --   TROPONINI 0.18* 1.15* 1.34* 1.50*   No results for input(s): LABPROT, INR in the last 72 hours. No results for input(s): COLORURINE, LABSPEC, [employer], GLUCOSEU, HGBUR, BILIRUBINUR, KETONESUR, PROTEINUR, UROBILINOGEN, NITRITE, LEUKOCYTESUR in the last 72 hours.  Invalid input(s): APPERANCEUR     Component Value Date/Time   CHOL 180 09/06/2014 0300   TRIG 135 09/06/2014 0300   HDL 52 09/06/2014 0300   CHOLHDL 3.5 09/06/2014 0300   VLDL 27 09/06/2014 0300   LDLCALC 101* 09/06/2014 0300  Lab Results  Component Value Date   HGBA1C 12.0* 09/06/2014      Component Value Date/Time   LABOPIA NONE DETECTED 09/05/2014 0820   COCAINSCRNUR NONE DETECTED 09/05/2014 0820   LABBENZ NONE DETECTED 09/05/2014 0820   AMPHETMU NONE DETECTED 09/05/2014 0820   THCU NONE DETECTED 09/05/2014 0820   LABBARB NONE DETECTED 09/05/2014 0820     Recent Labs Lab 09/05/14 0744  ETH <5    Ct Angio Head and Neck W/cm &/or Wo Cm 09/05/2014    1. Distal right M1 occlusion.  2. Abnormal perfusion throughout essentially the entirety of the right MCA territory. Much of this appears to represent oligemic brain with relatively preserved cerebral blood volume, however there is likely a developing region of core infarct involving the deep white matter.      Ct Head Wo Contrast 09/05/2014    Sequelae of interval cerebral angiography  and right MCA revascularization. No acute hemorrhage.        Ct Head and cervical spine Wo Contrast 09/05/2014    1. Interval development of ill-defined hypo densities within the bilateral centrum semiovale, right greater than left, favored to represent progressive age-indeterminate though at least subacute microvascular ischemic disease continued since remote examination performed 11/2012. Otherwise, no definite acute intracranial process.  2. No fracture or static subluxation of the cervical spine.  3. Moderate to severe multilevel cervical spine DDD, worse at C4-C5, C5-C6 and C6-C7.  4. Asymmetric enlargement of the right lobe of the thyroid and thyroid isthmus with suspected thyroid nodules as detailed above. While these nodules appear grossly similar to compared to the 07/2010 thyroid ultrasound, further evaluation could be performed with repeat dedicated non emergent thyroid ultrasound as clinically indicated.     Dg Pelvis Portable 09/05/2014    No acute fracture or dislocation.  Note that the lateral aspect of the trochanteric region of the right femur is not completely imaged. Recommend an additional cone-down view of the right hip for complete evaluation.      Dg Hip Unilat With Pelvis 2-3 Views Right 09/05/2014    No evidence of acute fracture or malalignment        Ct Cerebral Perfusion W/cm 09/05/2014    1. Distal right M1 occlusion.  2. Abnormal perfusion throughout essentially the entirety of the right MCA territory. Much of this appears to represent oligemic brain with relatively preserved cerebral blood volume, however there is likely a developing region of core infarct involving the deep white matter.     Dg Chest Port 1 View 09/05/2014    No acute cardiopulmonary disease.      Dg Chest Port 1v Same Day 09/05/2014    No acute findings.     Interventional Radiology Procedure 09/05/2014 S/P rt vert artery angiogram and RT common carotid areriogram,followed by complete  revascularization of occluded RT MCA prox with x1 pass with the Solitaire FR 66mm x 40 mm retrieval device and 5 mg of superselective INTEGRELIN. TICI 3 flow restored  CT Head wo 09/09/2014 : Expected evolution of embolic cerebral and cerebellar infarcts. Hemorrhagic conversion in the right putamen is stable; no evidence of new ischemia.  PHYSICAL EXAM Deferred as patient gone for test Neurological Exam : Patient is gone for test hence exam deferred     ASSESSMENT/PLAN Ms. SHAVONA GUNDERMAN is a 80 y.o. female with history of HTN, hyperlipidemia, DM, TIA, PFO with atrial septal aneurysm, presenting with left hemiparesis, decreased responsiveness. She did not receive IV t-PA - out of  the window for IV tpa, but patient taken to the IR suite for endovascular treatment..   Stroke:  Non-dominant. Right hemisphere from right MCA occlusion status post revascularization with 5 mg intra-arterial Integrilin and solitaire device from paradoxical embolism from LE DVT and atrial septal defect  Resultant  left hemiparesis. MRI  Multiple areas of acute infarct are present indicative of cerebral Emboli. re- canalization of the right MCA with a small amount of hemorrhage in the right putamen. There are scattered areas of infarct involving the right insula and right parietal lobe however there is no lobar right MCA infarct.  Carotid Doppler - See CT angiogram of the neck.  2D Echo pending  LDL 101  HgbA1c 12  SCDs for VTE prophylaxis  DIET - DYS 1 no liquids  aspirin 81 mg orally every day prior to admission, now on no antithrombotic  .  Ongoing aggressive stroke risk factor management  Therapy recommendations:  CLR  Disposition:  CLR  Hypertension  Home meds: Lisinopril-hydrochlorothiazide  BP low at times.   Hyperlipidemia  Home meds: No lipid lowering medications prior to admission.  LDL 101, goal < 70  Add statin once swallowing has been cleared.  Continue statin at  discharge  Diabetes  HgbA1c pending, goal < 7.0  Uncontrolled  Other Stroke Risk Factors  Advanced age  Hx TIA  PFO- check LE venous dopplers for DVT   Other Active Problems  Anemia  Leukocytosis  Intubated  Elevated troponin levels  DVT right popliteal vein but at risk for anticoagulation due to hemorrhagic infarct- repeat head CT in am IR  consult for IVC filter  Other Pertinent History    Hospital day # 4   IVC filter today. Repeat head CT in 1 week and if isodense infarct and no blood will change to NOAC   Antony Contras, MD Medical Director [facility] Pager: [phone] 09/09/2014 2:29 PM     To contact Stroke Continuity provider, please refer to [email or web address] After hours, contact General Neurology

## 2014-09-09 NOTE — Clinical Documentation Improvement (Addendum)
  Query #1 Please document a diagnosis requiring the administration of IV Cardene status post the Interventional Radiology procedure.   Query #2 Please clarify if the patient had Acute Respiratory Failure (including type - hypoxic, etc) or another diagnosis prior to intubation by anesthesia for the Interventional Radiology Procedure based on the clinical indicators below:  Patient noted to be Hypoxic by ED physician; 02 Sat 83% on nasal cannula in ED; patient required 100% NRB required to maintain 02 Sats greater than 90% up to the time of intubation.    ABG at 0820 on 09/05/14 -  Component     Latest Ref Rng 09/05/2014         8:21 AM  pH, Arterial     7.350 - 7.450 7.378  pCO2 arterial     35.0 - 45.0 mmHg 33.6 (L)  pO2, Arterial     80.0 - 100.0 mmHg 52.0 (L)  Bicarbonate     20.0 - 24.0 mEq/L 19.8 (L)  TCO2     0 - 100 mmol/L 21  Acid-base deficit     0.0 - 2.0 mmol/L 5.0 (H)  O2 Saturation      86.0  Patient temperature      98.6 F  Sample type      ARTERIAL    Thank You, Erling Conte ,RN Clinical Documentation Specialist:  [place] Information Management

## 2014-09-09 NOTE — Progress Notes (Signed)
Hypoglycemic Event  CBG: 48  Treatment: D50 IV 25 mL  Symptoms: Nervous/irritable  Follow-up CBG: Time:0500 CBG Result:123  Possible Reasons for Event: Other: NPO  Comments/MD notified:Yes     Christy Simmons L  Remember to initiate Hypoglycemia Order Set & complete

## 2014-09-09 NOTE — Progress Notes (Signed)
Occupational Therapy Treatment Patient Details Name: Christy Simmons MRN: [id number] DOB: 30-Apr-1935 Today's Date: 09/09/2014    History of present illness 80 y.o. female with history of HTN, hyperlipidemia, DM, TIA, PFO with atrial septal aneurysm, presenting with left hemiparesis, decreased responsiveness. pt s/p right hemisphere from right MCA occlusion status post revascularization.   OT comments  Pt progressing towards acute OT goals. Pt lethargic during session, suspect medication- nursing reports giving pt pain meds which may be affecting current level with mobility and ADLs.  Pt needing max assist for bed mobility. Pt at +2 max physical assist to total assist level with LB ADLs and transfers. Per nursing pt ok to work with. Continue to recommend CIR at d/c.  Follow Up Recommendations  CIR    Equipment Recommendations  Other (comment) (defer to next venue)    Recommendations for Other Services      Precautions / Restrictions Precautions Precautions: Fall Restrictions Weight Bearing Restrictions: No       Mobility Bed Mobility Overal bed mobility: Needs Assistance Bed Mobility: Rolling;Sidelying to Sit;Sit to Sidelying Rolling: Max assist Sidelying to sit: Max assist     Sit to sidelying: Max assist General bed mobility comments: tactile and verbal cues to faciliate pt assisting with bed mobility. max A for bed mobility and to scoot up in bed while EOB  Transfers                      Balance Overall balance assessment: Needs assistance Sitting-balance support: Single extremity supported;Feet supported Sitting balance-Leahy Scale: Poor Sitting balance - Comments: tactile and verbal cues and support to maintain sitting balance; unable to maintain midline upright posture Postural control: Other (comment) (anterior left lean and at times posterior left lean)                         ADL Overall ADL's : Needs assistance/impaired                                        General ADL Comments:Focus of session was on facilitating upright, midline posture and postural control sitting EOB. Pt required tactile and verbal cues for sitting postural control with support needed to maintain sitting balance. Pt more alert EOB, verbal and joking with therapist. Pt able to identify which direction she was leaning and with cueing able to initiate correction.  Pt able to bring suction to mouth using involved arm.       Vision                     Perception     Praxis      Cognition   Behavior During Therapy: Flat affect Overall Cognitive Status: Impaired/Different from baseline Area of Impairment: Following commands        Following Commands: Follows one step commands with increased time            Extremity/Trunk Assessment     Pt unable to maintain midline, upright posture sitting EOB. Pt able to initiate movements with cues provided. Isolated joint movements of LUE. Appears stronger distally than proximally.           Exercises     Shoulder Instructions       General Comments  Session was at bed level.    Pertinent Vitals/ Pain  Pain Assessment: Faces Pain Score: 5  Faces Pain Scale: Hurts even more Pain Location: R hip Pain Descriptors / Indicators: Grimacing Pain Intervention(s): Monitored during session;Repositioned;Limited activity within patient's tolerance  Home Living                                          Prior Functioning/Environment              Frequency Min 3X/week     Progress Toward Goals  OT Goals(current goals can now be found in the care plan section)  Progress towards OT goals: Progressing toward goals  Acute Rehab OT Goals OT Goal Formulation: With patient Time For Goal Achievement: 09/21/14 Potential to Achieve Goals: Good ADL Goals Pt Will Perform Grooming: with min assist Pt Will Transfer to Toilet: with min assist;stand pivot  transfer;bedside commode Additional ADL Goal #1: Pt will be min A for up to EOB for BADLs Additional ADL Goal #2: Pt will be able to maintain balance at EOB with min A for balance  Plan Discharge plan remains appropriate    Co-evaluation                 End of Session     Activity Tolerance Patient limited by lethargy;Patient limited by fatigue   Patient Left in bed;with call bell/phone within reach;with bed alarm set;with family/visitor present; SCTs reapplied   Nurse Communication Mobility status        Time: 1102-1130 OT Time Calculation (min): 28 min  Charges: OT General Charges $OT Visit: 1 Procedure OT Treatments $Self Care/Home Management : 23-37 mins  Hortencia Pilar 09/09/2014, 11:52 AM

## 2014-09-09 NOTE — Progress Notes (Signed)
PT Cancellation Note  Patient Details Name: Christy Simmons MRN: [id number] DOB: 06/05/1935   Cancelled Treatment:    Reason Eval/Treat Not Completed: Patient at procedure or test/unavailable. Pt off floor for IVC filter placement. Will re-attempt to see pt at next available time.   Elie Confer [place], [place] 09/09/2014, 12:03 PM

## 2014-09-09 NOTE — Progress Notes (Signed)
Speech Language Pathology Treatment: Dysphagia  Patient Details Name: Christy Simmons MRN: [id number] DOB: 1935-03-02 Today's Date: 09/09/2014 Time: 9937-1696 SLP Time Calculation (min) (ACUTE ONLY): 18 min  Assessment / Plan / Recommendation Clinical Impression  Pt seen with recommendations of Dys 1 and honey thick liquids following MBS 2/9. She is awake with periods of lethargy, fatigue, decreased endurance. Grimacing and complaint of continued pharyngeal pain during deglutition although reports some improvements (intubated less than 1 day, had large bore NGT). No overt indications of aspiration. Minimal reminders for consumption of small sips verbal and tactile (needs assist due to weakness).   HPI HPI: 80 y.o. female with a past medical history significant for HTN, hyperlipidemia, DM, TIA, PFO with atrial septal aneurysm admitted with left hemiparesis and decreased responsiveness. MRI showed Multiple areas of acute infarct are present indicative of cerebral emboli, right MCA branch infarcts including the right insula and basal ganglia with small hemorrhagic transformation in the right basal ganglis. In addition, infarcts in the left parietal lobe, bilateral PICA and left occipital lobe.  Underwent cerebral angiogram with complete revascularization of occluded RT MCA 2/6. Intubated 2/6-2/7. MBS recommended following bedside swallow assessment.     Pertinent Vitals Pain Assessment: Faces Pain Score: 5  Faces Pain Scale: Hurts even more Pain Location: R hip Pain Descriptors / Indicators: Grimacing Pain Intervention(s): Monitored during session;Repositioned;Limited activity within patient's tolerance  SLP Plan  Continue with current plan of care    Recommendations Diet recommendations: Dysphagia 1 (puree);Honey-thick liquid Liquids provided via: Cup;No straw Medication Administration: Crushed with puree Supervision: Patient able to self feed;Full supervision/cueing for compensatory  strategies Compensations: Slow rate;Small sips/bites Postural Changes and/or Swallow Maneuvers: Seated upright 90 degrees;Upright 30-60 min after meal              General recommendations: Rehab consult Oral Care Recommendations: Oral care BID Follow up Recommendations: Inpatient Rehab Plan: Continue with current plan of care    GO     Houston Siren 09/09/2014, 2:35 PM  Orbie Pyo Colvin Caroli.Ed [employer] [phone]

## 2014-09-10 ENCOUNTER — Inpatient Hospital Stay (HOSPITAL_COMMUNITY)
Admission: RE | Admit: 2014-09-10 | Discharge: 2014-09-13 | DRG: 057 | Disposition: A | Discharge status: Other Acute Inpatient Hospital | Payer: Medicare Other | Source: Intra-hospital | Attending: Internal Medicine | Admitting: Internal Medicine

## 2014-09-10 DIAGNOSIS — G8194 Hemiplegia, unspecified affecting left nondominant side: Secondary | ICD-10-CM

## 2014-09-10 DIAGNOSIS — I82442 Acute embolism and thrombosis of left tibial vein: Secondary | ICD-10-CM | POA: Diagnosis not present

## 2014-09-10 DIAGNOSIS — Z4682 Encounter for fitting and adjustment of non-vascular catheter: Secondary | ICD-10-CM | POA: Diagnosis not present

## 2014-09-10 DIAGNOSIS — I619 Nontraumatic intracerebral hemorrhage, unspecified: Secondary | ICD-10-CM | POA: Diagnosis present

## 2014-09-10 DIAGNOSIS — Z7982 Long term (current) use of aspirin: Secondary | ICD-10-CM | POA: Diagnosis not present

## 2014-09-10 DIAGNOSIS — I253 Aneurysm of heart: Secondary | ICD-10-CM | POA: Diagnosis present

## 2014-09-10 DIAGNOSIS — R0789 Other chest pain: Secondary | ICD-10-CM | POA: Diagnosis not present

## 2014-09-10 DIAGNOSIS — I611 Nontraumatic intracerebral hemorrhage in hemisphere, cortical: Secondary | ICD-10-CM

## 2014-09-10 DIAGNOSIS — R414 Neurologic neglect syndrome: Secondary | ICD-10-CM | POA: Diagnosis not present

## 2014-09-10 DIAGNOSIS — Q211 Atrial septal defect: Secondary | ICD-10-CM

## 2014-09-10 DIAGNOSIS — E785 Hyperlipidemia, unspecified: Secondary | ICD-10-CM | POA: Diagnosis not present

## 2014-09-10 DIAGNOSIS — E440 Moderate protein-calorie malnutrition: Secondary | ICD-10-CM | POA: Diagnosis not present

## 2014-09-10 DIAGNOSIS — E119 Type 2 diabetes mellitus without complications: Secondary | ICD-10-CM

## 2014-09-10 DIAGNOSIS — I10 Essential (primary) hypertension: Secondary | ICD-10-CM | POA: Diagnosis present

## 2014-09-10 DIAGNOSIS — G8190 Hemiplegia, unspecified affecting unspecified side: Secondary | ICD-10-CM | POA: Diagnosis not present

## 2014-09-10 DIAGNOSIS — I639 Cerebral infarction, unspecified: Secondary | ICD-10-CM | POA: Diagnosis present

## 2014-09-10 DIAGNOSIS — R1310 Dysphagia, unspecified: Secondary | ICD-10-CM | POA: Diagnosis not present

## 2014-09-10 DIAGNOSIS — R079 Chest pain, unspecified: Secondary | ICD-10-CM | POA: Diagnosis not present

## 2014-09-10 DIAGNOSIS — I82431 Acute embolism and thrombosis of right popliteal vein: Secondary | ICD-10-CM | POA: Diagnosis present

## 2014-09-10 DIAGNOSIS — H269 Unspecified cataract: Secondary | ICD-10-CM | POA: Diagnosis not present

## 2014-09-10 DIAGNOSIS — Z888 Allergy status to other drugs, medicaments and biological substances status: Secondary | ICD-10-CM | POA: Diagnosis not present

## 2014-09-10 DIAGNOSIS — Z794 Long term (current) use of insulin: Secondary | ICD-10-CM | POA: Diagnosis not present

## 2014-09-10 DIAGNOSIS — E1169 Type 2 diabetes mellitus with other specified complication: Secondary | ICD-10-CM | POA: Diagnosis not present

## 2014-09-10 DIAGNOSIS — Z8673 Personal history of transient ischemic attack (TIA), and cerebral infarction without residual deficits: Secondary | ICD-10-CM | POA: Diagnosis not present

## 2014-09-10 DIAGNOSIS — I69354 Hemiplegia and hemiparesis following cerebral infarction affecting left non-dominant side: Principal | ICD-10-CM

## 2014-09-10 DIAGNOSIS — B379 Candidiasis, unspecified: Secondary | ICD-10-CM | POA: Diagnosis not present

## 2014-09-10 DIAGNOSIS — K219 Gastro-esophageal reflux disease without esophagitis: Secondary | ICD-10-CM | POA: Diagnosis present

## 2014-09-10 DIAGNOSIS — E1142 Type 2 diabetes mellitus with diabetic polyneuropathy: Secondary | ICD-10-CM | POA: Diagnosis not present

## 2014-09-10 DIAGNOSIS — Z885 Allergy status to narcotic agent status: Secondary | ICD-10-CM | POA: Diagnosis not present

## 2014-09-10 DIAGNOSIS — I69991 Dysphagia following unspecified cerebrovascular disease: Secondary | ICD-10-CM

## 2014-09-10 DIAGNOSIS — I6381 Other cerebral infarction due to occlusion or stenosis of small artery: Secondary | ICD-10-CM | POA: Diagnosis present

## 2014-09-10 DIAGNOSIS — I82403 Acute embolism and thrombosis of unspecified deep veins of lower extremity, bilateral: Secondary | ICD-10-CM | POA: Diagnosis not present

## 2014-09-10 DIAGNOSIS — Z79899 Other long term (current) drug therapy: Secondary | ICD-10-CM | POA: Diagnosis not present

## 2014-09-10 DIAGNOSIS — I6340 Cerebral infarction due to embolism of unspecified cerebral artery: Secondary | ICD-10-CM

## 2014-09-10 HISTORY — DX: Cerebral infarction, unspecified: I63.9

## 2014-09-10 LAB — GLUCOSE, CAPILLARY
GLUCOSE-CAPILLARY: 150 mg/dL — AB (ref 70–99)
GLUCOSE-CAPILLARY: 161 mg/dL — AB (ref 70–99)
GLUCOSE-CAPILLARY: 165 mg/dL — AB (ref 70–99)
Glucose-Capillary: 71 mg/dL (ref 70–99)
Glucose-Capillary: 113 mg/dL — ABNORMAL HIGH (ref 70–99)

## 2014-09-10 MED ORDER — SORBITOL 70 % SOLN
30.0000 mL | Freq: Every day | Status: DC | PRN
  Administered 2014-09-11 – 2014-09-12 (×2): 30 mL via ORAL
  Filled 2014-09-10 (×3): qty 30

## 2014-09-10 MED ORDER — POTASSIUM CHLORIDE IN NACL 20-0.9 MEQ/L-% IV SOLN
INTRAVENOUS | Status: DC
  Administered 2014-09-10 – 2014-09-13 (×6): via INTRAVENOUS
  Filled 2014-09-10 (×8): qty 1000

## 2014-09-10 MED ORDER — GLUCERNA SHAKE PO LIQD
237.0000 mL | Freq: Three times a day (TID) | ORAL | Status: DC
  Administered 2014-09-10 – 2014-09-11 (×2): 237 mL via ORAL

## 2014-09-10 MED ORDER — TRAMADOL HCL 50 MG PO TABS
50.0000 mg | ORAL_TABLET | Freq: Two times a day (BID) | ORAL | Status: DC | PRN
  Administered 2014-09-11 (×3): 50 mg via ORAL
  Administered 2014-09-12: 100 mg via ORAL
  Administered 2014-09-12: 50 mg via ORAL
  Administered 2014-09-13: 100 mg via ORAL
  Filled 2014-09-10: qty 1
  Filled 2014-09-10 (×2): qty 2
  Filled 2014-09-10 (×3): qty 1

## 2014-09-10 MED ORDER — INSULIN ASPART 100 UNIT/ML ~~LOC~~ SOLN
0.0000 [IU] | SUBCUTANEOUS | Status: DC
  Administered 2014-09-11: 3 [IU] via SUBCUTANEOUS
  Administered 2014-09-11 – 2014-09-13 (×5): 2 [IU] via SUBCUTANEOUS

## 2014-09-10 MED ORDER — CALCIUM CARBONATE-VITAMIN D 500-200 MG-UNIT PO TABS
1.0000 | ORAL_TABLET | Freq: Every day | ORAL | Status: DC
  Administered 2014-09-11 – 2014-09-13 (×3): 1 via ORAL
  Filled 2014-09-10 (×4): qty 1

## 2014-09-10 MED ORDER — INSULIN GLARGINE 100 UNIT/ML ~~LOC~~ SOLN
20.0000 [IU] | Freq: Every day | SUBCUTANEOUS | Status: DC
  Administered 2014-09-11: 20 [IU] via SUBCUTANEOUS
  Filled 2014-09-10 (×2): qty 0.2

## 2014-09-10 MED ORDER — ONDANSETRON HCL 4 MG/2ML IJ SOLN
4.0000 mg | Freq: Four times a day (QID) | INTRAMUSCULAR | Status: DC | PRN
  Administered 2014-09-13: 4 mg via INTRAVENOUS
  Filled 2014-09-10: qty 2

## 2014-09-10 MED ORDER — ADULT MULTIVITAMIN W/MINERALS CH
1.0000 | ORAL_TABLET | Freq: Every day | ORAL | Status: DC
  Administered 2014-09-11 – 2014-09-13 (×3): 1 via ORAL
  Filled 2014-09-10 (×4): qty 1

## 2014-09-10 MED ORDER — ONDANSETRON HCL 4 MG PO TABS: 4.0000 mg | ORAL_TABLET | Freq: Four times a day (QID) | ORAL | Status: DC | PRN

## 2014-09-10 MED ORDER — ASPIRIN 81 MG PO CHEW
81.0000 mg | CHEWABLE_TABLET | Freq: Every day | ORAL | Status: DC
  Administered 2014-09-11 – 2014-09-13 (×3): 81 mg via ORAL
  Filled 2014-09-10 (×3): qty 1

## 2014-09-10 MED ORDER — BRIMONIDINE TARTRATE 0.2 % OP SOLN
1.0000 [drp] | Freq: Two times a day (BID) | OPHTHALMIC | Status: DC
  Administered 2014-09-10 – 2014-09-13 (×6): 1 [drp] via OPHTHALMIC
  Filled 2014-09-10: qty 5

## 2014-09-10 MED ORDER — RESOURCE THICKENUP CLEAR PO POWD
ORAL | Status: DC | PRN
  Filled 2014-09-10: qty 125

## 2014-09-10 MED ORDER — ACETAMINOPHEN 325 MG PO TABS: 650.0000 mg | ORAL_TABLET | ORAL | Status: DC | PRN

## 2014-09-10 MED ORDER — METHOCARBAMOL 500 MG PO TABS
500.0000 mg | ORAL_TABLET | Freq: Four times a day (QID) | ORAL | Status: DC | PRN
  Administered 2014-09-10 – 2014-09-13 (×6): 500 mg via ORAL
  Filled 2014-09-10 (×6): qty 1

## 2014-09-10 MED ORDER — SENNOSIDES-DOCUSATE SODIUM 8.6-50 MG PO TABS: 1.0000 | ORAL_TABLET | Freq: Every evening | ORAL | Status: DC | PRN

## 2014-09-10 MED ORDER — METOCLOPRAMIDE HCL 5 MG PO TABS
5.0000 mg | ORAL_TABLET | Freq: Three times a day (TID) | ORAL | Status: DC
  Administered 2014-09-10 – 2014-09-13 (×10): 5 mg via ORAL
  Filled 2014-09-10 (×15): qty 1

## 2014-09-10 MED ORDER — ACETAMINOPHEN 650 MG RE SUPP: 650.0000 mg | RECTAL | Status: DC | PRN

## 2014-09-10 NOTE — Progress Notes (Signed)
Insurance has approved an inpt rehab admission for this pt. I have contacted Dr. Leonie Man and he is aware. I will make the arrangements to admit pt today. I have notified Raquel Sarna, SW as requested as well as RNCM. [phone]

## 2014-09-10 NOTE — Discharge Summary (Signed)
Physician Discharge Summary  Patient ID: IANA BUZAN MRN: [id number] DOB/AGE: 03-10-1935 79 y.o.  Admit date: 09/05/2014 Discharge date: 09/10/2014  Admission Diagnoses:code stroke, left hemiparesis, decreased responsiveness  Discharge Diagnoses: Non-dominant. 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 female with a past medical history significant for HTN, hyperlipidemia, DM, TIA, PFO with atrial septal aneurysm, brought in via EMS as a code stroke due to acute onset of left hemiparesis, decreased responsiveness. Patient was last known well around 8 pm last night. Husband stated that she hasn't been feeling well in the last month, and this morning he found her in the bathroom around 6 or 6:30 unable to move the left side and not fully responsive. He thinks that she got up to to the bathroom probably around 4 or 4:30 am. Upon arrival to the ED she was able to answer simple questions, and had NIHSS 16. CT brain showed an area of well defined area of hypodensity around the right basal ganglia. Date last known well: 09/04/14 Time last known well: 8 pm tPA Given: no, out of the window for IV tpa, but patient taken to the IR suite for endovascular treatment as emergent CT angiogram showed a distal right M1 occlusion and CT perfusion scan showed a large area of potential salvageable brain. NIHSS: 16 She was admitted to the neurological intensive care unit and was initially intubated overnight after the revascularization procedure. She did well and was extubated. Her  blood pressure was tightly controlled. She was initially seen by interventional radiology and critical care medicine. She should neurological improvement and had mild left hemiparesis. LDL cholesterol was 101. Hemoglobin A1c was elevated at 12. Transthoracic echo showed normal ejection fraction without cardiac source of embolism. Patient had a history of atrial septal defect. Lower extremity venous Dopplers showed evidence of DVT. Hence patient was thought to have paradoxical embolism. Patient was however not felt to be a candidate for initiate anticoagulation because MRI scan of the brain showed partially hemorrhagic right basal ganglia infarct. Hence patient was started on aspirin 81 mg a day and the plan was to repeat a CT scan of the head in 5 days and if no hemorrhage was noted on the CT scan patient would be started on anticoagulation with anticoagulant. Patient had IVC filter placed by interventional radiology to decrease risk for pulmonary embolism and further strokes through paradoxical embolism. The risk and benefit of increasing stroke due to hemorrhage risk from strong anticoagulant was discussed with the patient, husband and multiple family members and they were in agreement with the plan. She was seen by physical, occupational, speech therapy and rehabilitation M.D. and felt to be a good patient for inpatient rehabilitation. She was transferred in a stable condition on 09/10/14 to inpatient rehabilitation Consults: pulmonary/intensive care, rehabilitation medicine and interventional and neurointerventional radiology  Significant Diagnostic Studies: see above  Discharge Exam: Blood pressure 100/59, pulse 64, temperature 98.8 F (37.1 C), temperature source Oral, resp. rate 18, height 5\' 4"  (1.626 m), weight 122 lb 5.7 oz (55.5 kg), SpO2 100 %.  Neurological Exam :  Patient  is is awake and alert. Speech is slow and hesitant but clearly without dysarthria or aphasia. She follows midline commands  and simple one-step commands in all 4 extremities. Slight right gaze preference but able to look to the left past midline. Pupils equal reactive. Fundi were not visualized. Left lower facial weakness. Tongue midline. Left hemiparesis 3/5 strength. With distal greater than proximal weakness Right lower extremity exam limited due to hip pain Right plantar is equivocal left upgoing. Sensation and coordination mildly impaired on the left . Gait deferred  Disposition: 01-Home or Self Care     Medication List    ASK your doctor about these medications        aspirin EC 81 MG tablet  Take 81 mg by mouth daily.     brimonidine 0.2 % ophthalmic solution  Commonly known as:  ALPHAGAN  Place 1 drop into both eyes 2 (two) times daily.     Calcium Citrate-Vitamin D 250-200 MG-UNIT Tabs  Take 1 tablet by mouth daily.     diazepam 5 MG tablet  Commonly known as:  VALIUM  Take 1 tablet (5 mg total) by mouth every 6 (six) hours as needed for muscle spasms.     esomeprazole 20 MG capsule  Commonly known as:  NEXIUM  Take 20 mg by mouth daily.     ibuprofen 200 MG tablet  Commonly known as:  ADVIL,MOTRIN  Take 200 mg by mouth daily as needed for mild pain.     insulin glargine 100 UNIT/ML injection  Commonly known as:  LANTUS  Inject 20 Units into the skin daily.     insulin lispro 100 UNIT/ML injection  Commonly known as:  HUMALOG  Inject into the skin 3 (three) times daily before meals. Per sliding scale - average 15 units     lisinopril-hydrochlorothiazide 20-25 MG per tablet  Commonly known as:  PRINZIDE,ZESTORETIC  Take 1 tablet by mouth daily.     Magnesium 200 MG Tabs  Take 400 mg by mouth daily.     metoCLOPramide 5 MG tablet  Commonly known as:  REGLAN  Take 5 mg by mouth 4 (four) times daily -  before meals and at bedtime.     multivitamin with minerals Tabs tablet  Take 1 tablet by mouth daily.     OSTEO BI-FLEX ADV JOINT SHIELD Tabs  Take 1 tablet by mouth daily.      oxyCODONE-acetaminophen 5-325 MG per tablet  Commonly known as:  PERCOCET/ROXICET  Take 1-2 tablets by mouth every 4 (four) hours as needed for moderate pain.     traMADol 50 MG tablet  Commonly known as:  ULTRAM  Take 50-100 mg by mouth 2 (two) times daily as needed for moderate pain.     Vitamin D3 1000 UNITS Caps  Take 1,000 Units by mouth daily.       Plan was to repeat a CT scan of the head in 5 days and if it showed no evidence of acute blood change aspirin to eliquis  2.5 mg twice daily.    Follow up with Dr Leonie Man in 2 months in office  Signed: SETHI,PRAMOD 09/10/2014, 3:42 PM

## 2014-09-10 NOTE — Progress Notes (Signed)
Insurance has denied my request for an inpt rehab stay due to perceived tolerance issues. I have discussed with pt and her husband at bedside. I emphasized the need to decrease pain medications. Dilaudid discontinued yesterday. Pt has received Percocet twice in the past 12 hours due to neck and RLE pain. Pt states she takes tramadol at hs pta when her legs ache. Has not taken percocet since her back surgery one year ago. I have discussed with Burnetta Sabin with Dr Clydene Fake service and I am requesting a peer to peer appeal to insurance company which I will arrange with Dr. Leonie Man for today. Pt and spouse are in agreement. I will follow up today with decision. [phone]

## 2014-09-10 NOTE — Progress Notes (Signed)
Occupational Therapy Treatment Patient Details Name: Christy Simmons MRN: [id number] DOB: [birth year] Today's Date: 09/10/2014    History of present illness 80 y.o. fale with history of HTN, hyperlipidemia, DM, TIA, PFO with atrial septal aneurysm, presenting with left hemiparesis, decreased responsiveness. pt s/p right hemisphere from right MCA occlusion status post revascularization. + BLE DVT. IVC placement 2/10.   OT comments  Pt with significant improvement from session yesterday with level of alertness, trunk control and functional use of LUE. Pt joking and talking with therapist.   Extremely motivated to increase her independence. Pt able to participate with ADL as noted below. Pt able to control trunk in sitting today and incorporating LUE in activities spontaneously. Excellent rehab candidate who will benefit greatly from CIR to facilitate return home with assistance of family. Will follow acutely. Additional goals established.  Follow Up Recommendations  CIR    Equipment Recommendations  3 in 1 bedside comode;Tub/shower bench    Recommendations for Other Services Rehab consult    Precautions / Restrictions Precautions Precautions: Fall Restrictions Weight Bearing Restrictions: No       Mobility Bed Mobility Overal bed mobility: Needs Assistance Bed Mobility: Rolling;Sidelying to Sit Rolling: +2 for physical assistance;Mod assist Sidelying to sit: Mod assist     Sit to sidelying: Mod assist;+2 for physical assistance (Pt initiating sit - sidelying. Min Assist for lowering trunk onto bed; Mod A to lift legs onto bed) General bed mobility comments: increased participation. Pt total A previous session  Transfers Overall transfer level: Needs assistance Equipment used: 2 person hand held assist Transfers: Sit to/from [facility] Sit to Stand: Mod assist;+2 physical assistance Stand pivot transfers: Mod assist;+2 physical assistance       General transfer  comment: assist to maintain upright midline trunk control; pt initiating anterior weight shift B to scoot forward in chair    Balance Overall balance assessment: Needs assistance Sitting-balance support: Bilateral upper extremity supported Sitting balance-Leahy Scale: Poor-Fair Sitting balance - Comments: able to maintain balance for @ 2 minutes sitting EOB unsupported at end of session when fatigued; occasional min A required to maintain Postural control: Posterior lean;Left lateral lean   Standing balance-Leahy Scale: Poor Standing balance comment: Stood using 3 musketeer approach; pt able to maintain control of LLE in standing with minimal knee buckling;  posterior bias                   ADL Overall ADL's : Needs assistance/impaired Eating/Feeding: Moderate assistance Eating/Feeding Details (indicate cue type and reason): honey thick liquid/pureed. daughter states pt helped to feed self and held fork Grooming: Oral care;Wash/dry face;Minimal assistance;Sitting Grooming Details (indicate cue type and reason): able to bring suction toothbrush to mouth and swab mouth. Washed face using R hand. Also able to bring left hand to face to wash mouth. Upper Body Bathing: Moderate assistance;Sitting   Lower Body Bathing: Maximal assistance   Upper Body Dressing : Maximal assistance Upper Body Dressing Details (indicate cue type and reason): simulated dressing     Toilet Transfer: Moderate assistance;+2 for physical assistance Toilet Transfer Details (indicate cue type and reason): 3 musketeer technique; simulated   Toileting - Clothing Manipulation Details (indicate cue type and reason): pt aware of toileting needs. states she is unable to get to the toilet at this time.     Functional mobility during ADLs: +2 for physical assistance;Moderate assistance;Cueing for sequencing (using 3 musketeer technique; pt able to take 4-5 steps from chair to bed. Pt initiating  weight shift with LLE  ) General ADL Comments: focus of session on trunk control in sitting. Pt able to shift weight anteriorly into upright midline position with minA at times for postural control with single UE support. Significant improvement from yesterday with ADL. Pt states her goal is to be able to get to the toilet.      Vision    will further assess                      Praxis  motor planning appears intact    Cognition   Behavior During Therapy: [facility] for tasks assessed/performed Overall Cognitive Status: Impaired/Different from baseline Area of Impairment: Attention;Awareness;Safety/judgement   Current Attention Level: Selective    Following Commands: Follows one step commands consistently Safety/Judgement: Decreased awareness of safety     General Comments: less lethargic than yesterday. Eyes open. participating in conversation. Making jokes with therapists.    Extremity/Trunk Assessment     Significant improvement in LUE strength. Pt with excellent isolated control. Stronger distally than proximally; however qable to lift L hand to reach face without assistance. Able to extend L arm against gravity (lifts hand off head)          Exercises Other Exercises Other Exercises: LUE A/AAROM in functional patterns. Pt able to lift L hand to scratch head with min A    Shoulder Instructions  Pt c/o pain @ r shoulder - upper trap area. rec use of moist heat for comfort     General Comments  "you earned your money with me today"    Pertinent Vitals/ Pain       Pain Assessment: Faces Pain Score: 4  Faces Pain Scale: Hurts little more Pain Location: neck - procedure Pain Descriptors / Indicators: Grimacing Pain Intervention(s): Monitored during session;Repositioned  Home Living     Available Help at Discharge: Available 24 hours/day;Family                     Bathroom Accessibility: Yes How Accessible: Accessible via walker        Lives With: Spouse    Prior  Functioning/Environment              Frequency Min 3X/week     Progress Toward Goals  OT Goals(current goals can now be found in the care plan section)  Progress towards OT goals: Progressing toward goals  Acute Rehab OT Goals Patient Stated Goal: to go home OT Goal Formulation: With patient Time For Goal Achievement: 09/21/14 Potential to Achieve Goals: Good ADL Goals Pt Will Perform Grooming: with min assist Pt Will Transfer to Toilet: with min assist;stand pivot transfer;bedside commode Additional ADL Goal #1: Pt will be min A for up to EOB for BADLs Additional ADL Goal #2: Pt will be able to maintain balance at EOB with min A for balance  Goal 2 met; grooming goal met  Plan Discharge plan remains appropriate     Co-evaluation                 End of Session Equipment Utilized During Treatment: Gait belt   Activity Tolerance Patient tolerated treatment well   Patient Left in bed;with call bell/phone within reach;with bed alarm set;with family/visitor present   Nurse Communication Mobility status        Time: 9518-8416 OT Time Calculation (min): 41 min  Charges: OT General Charges $OT Visit: 1 Procedure OT Treatments $Self Care/Home Management : 8-22 mins $Therapeutic Activity: 8-22  mins $Neuromuscular Re-education: 8-22 mins  Burnett Spray,HILLARY 09/10/2014, 2:37 PM   [facility], OTR/L  [phone] 09/10/2014

## 2014-09-10 NOTE — H&P (Signed)
Physical Medicine and Rehabilitation Admission H&P   Chief Complaint  Patient presents with  . Code Stroke  : Chief complaint: Headache  HPI: Christy Simmons is a 80 y.o. right handed female with history of hypertension, diabetes mellitus and peripheral neuropathy, PFO with atrial septal aneurysm maintained on aspirin 81 mg daily. Independent prior to admission living with her husband. Presented 09/05/2014 with left-sided weakness and altered mental status. CT of the head show well-defined area of hypodensity around the right basal ganglia. Patient did not receive TPA. CT angiogram head and neck showed distal right M1 occlusion, abnormal perfusion throughout essentially the entirety of the right MCA territory. Underwent complete revascularization of occluded right MCA per interventional radiology. MRI of the brain showed patchy right MCA branch infarct with small hemorrhagic transformation in the right basal ganglia but no significant midline shift. Echocardiogram with ejection fraction of 09% grade 1 diastolic dysfunction. Neurology services consulted maintained on aspirin for CVA prophylaxis. Patient did require intubation followed by critical care and extubated without difficulty 09/07/2014. A venous Doppler study lower extremities completed 09/07/2014 showed findings consistent with nonocclusive DVT right popliteal vein as well as DVT left posterior tibial vein. Interventional radiology consulted for plan of IVC filter and no other anticoagulation at this time due to hemorrhagic infarct and completed 09/09/2014. Modified barium swallow 09/08/2014 recommendations of dysphagia 1 honey thick liquid diet. Physical and occupational therapy evaluations completed with recommendations of physical medicine rehabilitation consult. Patient was admitted for a comprehensive rehabilitation program  Patient is tired, some neck pain from IVC filter insertion site on the right side Per report, patient's  daughter has been encouraging pain medication use for patient even when she has been lethargic. Patient has been reportedly taking tramadol at home on an intermittent basis although the daughter reports patient taking Percocet at home on a consistent basis. Daughter has history of chronic low back pain status post multiple surgeries and has been on pain medication herself  ROS Review of Systems  Gastrointestinal: Positive for constipation.   GERD  Musculoskeletal: Positive for myalgias.  Neurological: Positive for headaches.  All other systems reviewed and are negative    Past Medical History  Diagnosis Date  . Transient ischemic attack   . HTN (hypertension)   . Hyperlipidemia   . DM (diabetes mellitus)   . GERD (gastroesophageal reflux disease)   . Heart disease     PFO  . Amnesia 01/10/2013  . Patent foramen ovale with atrial septal aneurysm   . Complication of anesthesia   . PONV (postoperative nausea and vomiting)   . Family history of anesthesia complication     "My brother has trouble waking up."  . Glaucoma     Hx: of  . Early cataracts, bilateral     Hx: of  . Arthritis    Past Surgical History  Procedure Laterality Date  . US echocardiography  10-13-11    EF 60-65% normal  . Back surgery      disc   . Tonsillectomy    . Tee without cardioversion  10/30/2011    Procedure: TRANSESOPHAGEAL ECHOCARDIOGRAM (TEE); Surgeon: Larey Dresser, MD; Location: [place]; Service: Cardiovascular; Laterality: N/A;  . Colonoscopy w/ biopsies and polypectomy      HX;of  . Tonsillectomy    . Tubal ligation    . Lumbar laminectomy/decompression microdiscectomy N/A 09/11/2013    Procedure: LUMBAR LAMINECTOMY/DECOMPRESSION MICRODISCECTOMY 2 LEVELS; Surgeon: Ophelia Charter, MD; Location: [place]; Service: Neurosurgery; Laterality: N/A; Lumbar  Four-Five  Laminectomy and Foraminotomy with redo Lumbar Five-Sacral One diskectomy  . Radiology with anesthesia N/A 09/05/2014    Procedure: RADIOLOGY WITH ANESTHESIA; Surgeon: Rob Hickman, MD; Location: [place]; Service: Radiology; Laterality: N/A;   Family History  Problem Relation Age of Onset  . Stroke      No family history of such  . Congenital heart disease      No family history of such  . Anesthesia problems Neg Hx   . Diabetes Maternal Grandfather    Social History:  reports that she has never smoked. She has never used smokeless tobacco. She reports that she does not drink alcohol or use illicit drugs. Allergies:  Allergies  Allergen Reactions  . Gabapentin Other (See Comments)    Caused depression  . Codeine     Headache   Medications Prior to Admission  Medication Sig Dispense Refill  . aspirin EC 81 MG tablet Take 81 mg by mouth daily.    . brimonidine (ALPHAGAN) 0.2 % ophthalmic solution Place 1 drop into both eyes 2 (two) times daily.     . Calcium Citrate-Vitamin D 250-200 MG-UNIT TABS Take 1 tablet by mouth daily.    . Cholecalciferol (VITAMIN D3) 1000 UNITS CAPS Take 1,000 Units by mouth daily.    Marland Kitchen esomeprazole (NEXIUM) 20 MG capsule Take 20 mg by mouth daily.     Marland Kitchen ibuprofen (ADVIL,MOTRIN) 200 MG tablet Take 200 mg by mouth daily as needed for mild pain.     Marland Kitchen insulin glargine (LANTUS) 100 UNIT/ML injection Inject 20 Units into the skin daily.     . insulin lispro (HUMALOG) 100 UNIT/ML injection Inject into the skin 3 (three) times daily before meals. Per sliding scale - average 15 units    . lisinopril-hydrochlorothiazide (PRINZIDE,ZESTORETIC) 20-25 MG per tablet Take 1 tablet by mouth daily.    . Magnesium 200 MG TABS Take 400 mg by mouth daily.    . metoCLOPramide (REGLAN) 5 MG tablet Take 5 mg by mouth 4 (four) times daily - before meals and at bedtime.      . Misc Natural Products (OSTEO BI-FLEX ADV JOINT SHIELD) TABS Take 1 tablet by mouth daily.    . Multiple Vitamin (MULITIVITAMIN WITH MINERALS) TABS Take 1 tablet by mouth daily.    . traMADol (ULTRAM) 50 MG tablet Take 50-100 mg by mouth 2 (two) times daily as needed for moderate pain.     . diazepam (VALIUM) 5 MG tablet Take 1 tablet (5 mg total) by mouth every 6 (six) hours as needed for muscle spasms. (Patient not taking: Reported on 09/05/2014) 60 tablet 0  . oxyCODONE-acetaminophen (PERCOCET/ROXICET) 5-325 MG per tablet Take 1-2 tablets by mouth every 4 (four) hours as needed for moderate pain. (Patient not taking: Reported on 09/05/2014) 60 tablet 0    Home: Home Living Family/patient expects to be discharged to:: Private residence Living Arrangements: Spouse/significant other Type of Home: House Home Access: Stairs to enter Technical brewer of Steps: 2 Entrance Stairs-Rails: None Home Layout: One level Home Equipment: Environmental consultant - 2 wheels, Cane - single point, Bedside commode Additional Comments: walk in shower and tub shower  Functional History: Prior Function Level of Independence: Independent  Functional Status:  Mobility: Bed Mobility Overal bed mobility: Needs Assistance Bed Mobility: Rolling, Sidelying to Sit Rolling: Min assist, +2 for physical assistance Sidelying to sit: Mod assist, +2 for physical assistance Supine to sit: Max assist General bed mobility comments: patient able to get BLE off EOB  alone; rolling min A to get initiated then pt. used bed rail to complete; mod A for posterior trunk and positioning of BLE to come to upright. Transfers Overall transfer level: Needs assistance Equipment used: 2 person hand held assist Transfers: Sit to/from Stand, Stand Pivot Transfers Sit to Stand: Mod assist, +2 physical assistance Stand pivot transfers: Mod assist, +2 physical assistance General transfer comment: Assist for  elevation to upright, Bilateral LE knee blocking and assist to initiate pivotal steps to chair. Increased time to perform. Ambulation/Gait Ambulation/Gait assistance: Mod assist, +2 physical assistance Ambulation Distance (Feet): 4 Feet Assistive device: 2 person hand held assist Gait Pattern/deviations: Shuffle, Step-to pattern, Decreased stride length, Trunk flexed Gait velocity: decreased Gait velocity interpretation: Below normal speed for age/gender General Gait Details: no buckling of LEs, slow, guarded gait to chair    ADL: ADL Overall ADL's : Needs assistance/impaired Eating/Feeding: NPO Grooming: Maximal assistance, Sitting (supported) Upper Body Bathing: Maximal assistance, Sitting (supported) Lower Body Bathing: Maximal assistance (with +2 mod A sit<>stand) Upper Body Dressing : Total assistance, Sitting (supported ) Lower Body Dressing: Total assistance (with +2 mod A sit<>stand) Toilet Transfer: Moderate assistance, +2 for physical assistance, Stand-pivot Toileting- Clothing Manipulation and Hygiene: Total assistance (with +2 mod A sit<>stand)  Cognition: Cognition Overall Cognitive Status: Impaired/Different from baseline Orientation Level: Oriented X4 Cognition Arousal/Alertness: Lethargic Behavior During Therapy: Flat affect Overall Cognitive Status: Impaired/Different from baseline Area of Impairment: Following commands Memory: Decreased short-term memory (last thing she remembers is going to bed, not getting up to BR) Following Commands: Follows one step commands with increased time General Comments: drowsy during session.   Physical Exam: Blood pressure 128/59, pulse 72, temperature 98.5 F (36.9 C), temperature source Axillary, resp. rate 18, height 5\' 4"  (1.626 m), weight 55.5 kg (122 lb 5.7 oz), SpO2 95 %. Physical Exam HENT:  Left facial weakness  Eyes:  Pupils reactive to light without nystagmus  Neck: Normal range of motion. Neck supple. No  thyromegaly present.  Cardiovascular:  Cardiac rate control  Respiratory: Effort normal and breath sounds normal.  Limited inspiratory effort  GI: Soft. Bowel sounds are normal. She exhibits no distension.  Neurological:  Pt appears fatigued but focuses with cueing. She was able to state her first and last name as well as age and date of birth. Followed simple one-step commands. Her voice is but a whisper but intelligible. Left facial droop. Motor strength is 4/5 in right deltoid, biceps, triceps, grip, hip flexor, knee extensor, ankle dorsal flexion plan flexion 3 minus in the left deltoid, biceps, triceps, grip, hip flexor, knee extensor, 2 minus ankle dorsiflexor plantar flexor Sensory reduced to light touch left upper and left lower extremity, intact on the right side---very inconsistent due to her fatigue  Skin: Skin is warm and dry.  Psychiatric:  flat   Lab Results Last 48 Hours    Results for orders placed or performed during the hospital encounter of 09/05/14 (from the past 48 hour(s))  Glucose, capillary Status: Abnormal   Collection Time: 09/07/14 8:04 AM  Result Value Ref Range   Glucose-Capillary 144 (H) 70 - 99 mg/dL   Comment 1 Documented in Chart    Comment 2 Notify RN   Glucose, capillary Status: Abnormal   Collection Time: 09/07/14 12:11 PM  Result Value Ref Range   Glucose-Capillary 133 (H) 70 - 99 mg/dL   Comment 1 Documented in Chart    Comment 2 Notify RN   Glucose, capillary Status: Abnormal   Collection  Time: 09/07/14 4:32 PM  Result Value Ref Range   Glucose-Capillary 107 (H) 70 - 99 mg/dL  Glucose, capillary Status: Abnormal   Collection Time: 09/07/14 7:58 PM  Result Value Ref Range   Glucose-Capillary 112 (H) 70 - 99 mg/dL   Comment 1 Documented in Char    Comment 2 Repeat Test   Glucose, capillary Status: Abnormal   Collection Time: 09/08/14 11:56 AM   Result Value Ref Range   Glucose-Capillary 135 (H) 70 - 99 mg/dL  Glucose, capillary Status: Abnormal   Collection Time: 09/08/14 4:26 PM  Result Value Ref Range   Glucose-Capillary 137 (H) 70 - 99 mg/dL  Glucose, capillary Status: Abnormal   Collection Time: 09/08/14 8:10 PM  Result Value Ref Range   Glucose-Capillary 141 (H) 70 - 99 mg/dL   Comment 1 Notify RN    Comment 2 Documented in Char   Glucose, capillary Status: Abnormal   Collection Time: 09/08/14 11:55 PM  Result Value Ref Range   Glucose-Capillary 103 (H) 70 - 99 mg/dL   Comment 1 Notify RN    Comment 2 Documented in Char   Glucose, capillary Status: Abnormal   Collection Time: 09/09/14 4:23 AM  Result Value Ref Range   Glucose-Capillary 48 (L) 70 - 99 mg/dL  Glucose, capillary Status: Abnormal   Collection Time: 09/09/14 5:00 AM  Result Value Ref Range   Glucose-Capillary 123 (H) 70 - 99 mg/dL      Imaging Results (Last 48 hours)    Dg Swallowing Func-speech Pathology  09/08/2014 Orbie Pyo [place], CCC-SLP 09/08/2014 12:26 PM Objective Swallowing Evaluation: Modified Barium Swallowing Study Patient Details Name: SITLALY GUDIEL MRN: [id number] Date of Birth: Aug 27, 1934 Today's Date: 09/08/2014 Time: SLP Start Time (ACUTE ONLY): 1045-SLP Stop Time (ACUTE ONLY): 1105 SLP Time Calculation (min) (ACUTE ONLY): 20 min Past Medical History: Past Medical History Diagnosis Date . Transient ischemic attack . HTN (hypertension) . Hyperlipidemia . DM (diabetes mellitus) . GERD (gastroesophageal reflux disease) . Heart disease PFO . Amnesia 01/10/2013 . Patent foramen ovale with atrial septal aneurysm . Complication of anesthesia . PONV (postoperative nausea and vomiting) . Family history of anesthesia complication "My brother has trouble waking up." . Glaucoma Hx: of . Early cataracts, bilateral  Hx: of . Arthritis Past Surgical History: Past Surgical History Procedure Laterality Date . US echocardiography 10-13-11 EF 60-65% normal . Back surgery disc . Tonsillectomy . Tee without cardioversion 10/30/2011 Procedure: TRANSESOPHAGEAL ECHOCARDIOGRAM (TEE); Surgeon: Larey Dresser, MD; Location: [place]; Service: Cardiovascular; Laterality: N/A; . Colonoscopy w/ biopsies and polypectomy HX;of . Tonsillectomy . Tubal ligation . Lumbar laminectomy/decompression microdiscectomy N/A 09/11/2013  Procedure: LUMBAR LAMINECTOMY/DECOMPRESSION MICRODISCECTOMY 2 LEVELS; Surgeon: Ophelia Charter, MD; Location: [place] [facility]; Service: Neurosurgery; Laterality: N/A; Lumbar Four-Five Laminectomy and Foraminotomy with redo Lumbar Five-Sacral One diskectomy . Radiology with anesthesia N/A 09/05/2014 Procedure: RADIOLOGY WITH ANESTHESIA; Surgeon: Rob Hickman, MD; Location: [place]; Service: Radiology; Laterality: N/A; HPI: HPI: 80 y.o. female with a past medical history significant for HTN, hyperlipidemia, DM, TIA, PFO with atrial septal aneurysm admitted with left hemiparesis and decreased responsiveness. MRI showed Multiple areas of acute infarct are present indicative of cerebral emboli, right MCA branch infarcts including the right insula and basal ganglia with small hemorrhagic transformation in the right basal ganglis. In addition, infarcts in the left parietal lobe, bilateral PICA and left occipital lobe. Underwent cerebral angiogram with complete revascularization of occluded RT MCA 2/6. Intubated 2/6-2/7. MBS recommended following bedside swallow assessment. No Data  Recorded Assessment / Plan / Recommendation CHL IP CLINICAL IMPRESSIONS 09/08/2014 Dysphagia Diagnosis Mild oral phase dysphagia;Moderate oral phase dysphagia;Mild pharyngeal phase dysphagia Clinical impression Pt exhibits a mild-moderate oral dyspahgia due to motor  weakness leading to mild lingual residue and decreased mandibular excursion and delayed transit with solid and mechanical soft textures. Pharyngeal phase characterized by primarily motor impairments of decreased laryngeal elevation and incomplete closure with penetration of nectar barium (decreased sensation). No significant pharyngeal residue. Pt's demonstrating fatigue and decreased endurance throughout study increasing aspiration risk. Throat clearing present during when barium was not observed in laryngeal vestibule. Recommend Dys 1 diet and honey thick liquids, full supervision, crush pills, no straws. ST will continue intervention. CHL IP TREATMENT RECOMMENDATION 09/08/2014 Treatment Plan Recommendations Therapy as outlined in treatment plan below CHL IP DIET RECOMMENDATION 09/08/2014 Diet Recommendations Dysphagia 1 (Puree);Honey-thick liquid Liquid Administration via Cup;No straw Medication Administration Crushed with puree Compensations Slow rate;Small sips/bites Postural Changes and/or Swallow Maneuvers Seated upright 90 degrees;Upright 30-60 min after meal CHL IP OTHER RECOMMENDATIONS 09/08/2014 Recommended Consults (None) Oral Care Recommendations Oral care BID Other Recommendations Order thickener from pharmacy CHL IP FOLLOW UP RECOMMENDATIONS 09/08/2014 Follow up Recommendations (No Data) CHL IP FREQUENCY AND DURATION 09/08/2014 Speech Therapy Frequency (ACUTE ONLY) min 2x/week Treatment Duration 2 weeks Pertinent Vitals/Pain Pt reported no pain since received pain meds this morning SLP Swallow Goals No flowsheet data found. No flowsheet data found. CHL IP REASON FOR REFERRAL 09/08/2014 Reason for Referral Objectively evaluate swallowing function CHL IP ORAL PHASE 09/08/2014 Lips (None) Tongue (None) Mucous membranes (None) Nutritional status (None) Other (None) Oxygen therapy (None) Oral Phase Impaired Oral - Pudding Teaspoon (None) Oral -  Pudding Cup (None) Oral - Honey Teaspoon (None) Oral - Honey Cup Lingual/palatal residue Oral - Honey Syringe (None) Oral - Nectar Teaspoon (None) Oral - Nectar Cup (None) Oral - Nectar Straw (None) Oral - Nectar Syringe (None) Oral - Ice Chips (None) Oral - Thin Teaspoon (None) Oral - Thin Cup (None) Oral - Thin Straw (None) Oral - Thin Syringe (None) Oral - Puree (None) Oral - Mechanical Soft Delayed oral transit;Weak lingual manipulation Oral - Regular Weak lingual manipulation;Delayed oral transit Oral - Multi-consistency (None) Oral - Pill (None) Oral Phase - Comment (None) CHL IP PHARYNGEAL PHASE 09/08/2014 Pharyngeal Phase Impaired Pharyngeal - Pudding Teaspoon (None) Penetration/Aspiration details (pudding teaspoon) (None) Pharyngeal - Pudding Cup (None) Penetration/Aspiration details (pudding cup) (None) Pharyngeal - Honey Teaspoon (None) Penetration/Aspiration details (honey teaspoon) (None) Pharyngeal - Honey Cup [facility] Penetration/Aspiration details (honey cup) (None) Pharyngeal - Honey Syringe (None) Penetration/Aspiration details (honey syringe) (None) Pharyngeal - Nectar Teaspoon WFL Penetration/Aspiration details (nectar teaspoon) (None) Pharyngeal - Nectar Cup Penetration/Aspiration during swallow;Reduced laryngeal elevation;Reduced airway/laryngeal closure Penetration/Aspiration details (nectar cup) Material enters airway, remains ABOVE vocal cords and not ejected out Pharyngeal - Nectar Straw (None) Penetration/Aspiration details (nectar straw) (None) Pharyngeal - Nectar Syringe (None) Penetration/Aspiration details (nectar syringe) (None) Pharyngeal - Ice Chips (None) Penetration/Aspiration details (ice chips) (None) Pharyngeal - Thin Teaspoon (None) Penetration/Aspiration details (thin teaspoon) (None) Pharyngeal - Thin Cup (None) Penetration/Aspiration details (thin cup) (None) Pharyngeal - Thin Straw (None) Penetration/Aspiration details (thin  straw) (None) Pharyngeal - Thin Syringe (None) Penetration/Aspiration details (thin syringe') (None) Pharyngeal - Puree Delayed swallow initiation;Premature spillage to valleculae Penetration/Aspiration details (puree) (None) Pharyngeal - Mechanical Soft WFL Penetration/Aspiration details (mechanical soft) (None) Pharyngeal - Regular WFL Penetration/Aspiration details (regular) (None) Pharyngeal - Multi-consistency (None) Penetration/Aspiration details (multi-consistency) (None) Pharyngeal - Pill (None) Penetration/Aspiration details (pill) (None) Pharyngeal Comment (  None) CHL IP CERVICAL ESOPHAGEAL PHASE 09/08/2014 Cervical Esophageal Phase WFL Pudding Teaspoon (None) Pudding Cup (None) Honey Teaspoon (None) Honey Cup (None) Honey Syringe (None) Nectar Teaspoon (None) Nectar Cup (None) Nectar Straw (None) Nectar Syringe (None) Thin Teaspoon (None) Thin Cup (None) Thin Straw (None) Thin Syringe (None) Cervical Esophageal Comment (None) No flowsheet data found. [place] Siren 09/08/2014, 12:24 PM Orbie Pyo Colvin Caroli.Ed [employer] Pager [phone] Orbie Pyo [place].Ed CCC-SLP Pager [phone]        Medical Problem List and Plan: 1. 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 DVT Prophylaxis/Anticoagulation: Nonocclusive DVT right popliteal vein and DVT left posterior tibial vein. Status post IVC filter 09/09/2014 per interventional radiology 3. Pain Management: Ultram 50-100 mg twice a day as needed and Robaxin. Monitor with increased mobility 4. Dysphagia. Dysphagia 1 honey liquids. Monitor hydration with IV fluids 5. Neuropsych: This patient is capable of making decisions on her own behalf. 6. Skin/Wound Care: Routine skin checks 7. Fluids/Electrolytes/Nutrition: Strict I and O with follow-up chemistries 8. History of PFO with  atrial septal aneurysm. Continue aspirin 9. Hypertension. No current antihypertensive medication. Patient on lisinopril-hydrochlorothiazide 20-25 milligrams daily prior to admission. Resume as needed 10. Diabetes mellitus with peripheral neuropathy. Hemoglobin A1c of 12. Lantus insulin 20 units daily. Check blood sugars before meals and at bedtime. Diabetic teaching   Post Admission Physician Evaluation: 1. Functional deficits secondary to right basal ganglia infarction from right MCA occlusion status post revascularization hemorrhagic conversion in the right putamen. 2. Patient is admitted to receive collaborative, interdisciplinary care between the physiatrist, rehab nursing staff, and therapy team. 3. Patient's level of medical complexity and substantial therapy needs in context of that medical necessity cannot be provided at a lesser intensity of care such as a SNF. 4. Patient has experienced substantial functional loss from his/her baseline which was documented above under the "Functional History" and "Functional Status" headings. Judging by the patient's diagnosis, physical exam, and functional history, the patient has potential for functional progress which will result in measurable gains while on inpatient rehab. These gains will be of substantial and practical use upon discharge in facilitating mobility and self-care at the household level. 5. Physiatrist will provide 24 hour management of medical needs as well as oversight of the therapy plan/treatment and provide guidance as appropriate regarding the interaction of the two. 6. 24 hour rehab nursing will assist with bladder management, bowel management, safety, skin/wound care, disease management, medication administration, pain management and patient education and help integrate therapy concepts, techniques,education, etc. 7. PT will assess and treat for/with: pre gait, gait training, endurance , safety, equipment, neuromuscular re  education. Goals are: min/sup. 8. OT will assess and treat for/with: ADLs, Cognitive perceptual skills, Neuromuscular re education, safety, endurance, equipment. Goals are: min/sup. Therapy may proceed with showering this patient. 9. SLP will assess and treat for/with: Memory, attention, concentration, problem solving, assess for left inattention. Goals are: Improve cognitive tests allow medication management supervision. 10. Case Management and Social Worker will assess and treat for psychological issues and discharge planning. 11. Team conference will be held weekly to assess progress toward goals and to determine barriers to discharge. 12. Patient will receive at least 3 hours of therapy per day at least 5 days per week. 13. ELOS: 13-17 days  14. Prognosis: good     [name] M.D. [facility] FAAPM&R (Sports Med, Neuromuscular Med) Diplomate  Am Board of Electrodiagnostic Med

## 2014-09-10 NOTE — Progress Notes (Signed)
PMR Admission Coordinator Pre-Admission Assessment  Patient: Christy Simmons is an 80 y.o., female MRN: [id number] DOB: 08-01-1934 Height: 5\' 4"  (162.6 cm) Weight: 55.5 kg (122 lb 5.7 oz)  Insurance Information HMO: yes PPO: PCP: IPA: 80/20: OTHER: medicare replacement PRIMARY: Bubba Hales Policy#: [id number] Subscriber: pt CM Name: Katrina Phone#: [phone] ext [phone] Fax#: [phone] Pre-Cert#: [id number] Employer: retired onsite f/i [employer] Gerold [id number] Benefits: Phone #: [phone] Name: 2.10.16 Eff. Date: 07/31/14 Deduct: none Out of Pocket Max: [phone] Life Max: none CIR: $430 per day days 1-4 SNF: no copay days 1-20; $160 per day days 21-62; no copay days 63-100 Outpatient: $40 copay per visit Co-Pay: no visit limit Home Health: 100% Co-Pay: no visit limit DME: 80% Co-Pay: 20% Providers: in [employer] originally denied request for inpt admit but appealed and decision was overturned.   SECONDARY: none  Medicaid Application Date: Case Manager:  Disability Application Date: Case Worker:   Emergency Contact Information Contact Information    Name Relation Home Work [place] Spouse [phone]  [place] Daughter [phone]  [phone]     Current Medical History  Patient Admitting Diagnosis: right basal ganglia infarct  History of Present Illness:Christy Simmons is a 80 y.o. right handed female with history of hypertension, diabetes mellitus and peripheral neuropathy, PFO with atrial septal aneurysm maintained on aspirin 81 mg daily. Independent prior to admission living with her husband.  Presented 09/05/2014 with left-sided weakness and altered mental status. CT of the  head show well-defined area of hypodensity around the right basal ganglia. Patient did not receive TPA. CT angiogram head and neck showed distal right M1 occlusion, abnormal perfusion throughout essentially the entirety of the right MCA territory. Underwent complete revascularization of occluded right MCA per interventional radiology. MRI of the brain shows multiple areas of acute infarct in the right MCA territory involving the right insula, posterior basal ganglia and right parietal lobe indicating of a cerebral MRI. Echocardiogram with ejection fraction of 67% grade 1 diastolic dysfunction. Patient did require intubation followed by critical care and extubated without difficulty 09/07/2014.  Lower extremity dopplers with venous clots and need to hold anticoagulation due to hemorhagic brain infarct. IVC filter placed 09/09/14. Repeat head 2/20 shows persistent 1 cm hemorrhagic component of right basal ganglia infarct.  Asymmetric enlargement of the right lobe of the thyroid and thyroid isthmus with suspected thyroid nodules as detailed above. While these nodules appear grossly similar to compared to the 07/2010 thyroid ultrasound, further evaluation could be performed with repeat dedicated non emergent thyroid ultrasound as clinically indicated.   Total: 6 NIH    Past Medical History  Past Medical History  Diagnosis Date  . Transient ischemic attack   . HTN (hypertension)   . Hyperlipidemia   . DM (diabetes mellitus)   . GERD (gastroesophageal reflux disease)   . Heart disease     PFO  . Amnesia 01/10/2013  . Patent foramen ovale with atrial septal aneurysm   . Complication of anesthesia   . PONV (postoperative nausea and vomiting)   . Family history of anesthesia complication     "My brother has trouble waking up."  . Glaucoma     Hx: of  . Early cataracts, bilateral     Hx: of  . Arthritis     Family History  family history  includes Congenital heart disease in an other family member; Diabetes in her maternal grandfather; Stroke in an other family member. There is no history  of Anesthesia problems.  Prior Rehab/Hospitalizations: home health after back surgeries  Current Medications   Current facility-administered medications:  . 0.9 % NaCl with KCl 20 mEq/ L infusion, , Intravenous, Continuous, Wilhelmina Mcardle, MD, Last Rate: 75 mL/hr at 09/10/14 1032 . acetaminophen (TYLENOL) tablet 650 mg, 650 mg, Oral, Q4H PRN **OR** acetaminophen (TYLENOL) suppository 650 mg, 650 mg, Rectal, Q4H PRN, Amie Portland, MD . aspirin chewable tablet 81 mg, 81 mg, Oral, Daily, Donzetta Starch, NP, 81 mg at 09/10/14 1034 . brimonidine (ALPHAGAN) 0.2 % ophthalmic solution 1 drop, 1 drop, Both Eyes, BID, Antony Contras, MD, 1 drop at 09/10/14 1033 . calcium-vitamin D (OSCAL WITH D) 500-200 MG-UNIT per tablet 1 tablet, 1 tablet, Oral, Q breakfast, Arty Baumgartner, [facility], 1 tablet at 09/10/14 [phone] . diazepam (VALIUM) tablet 5 mg, 5 mg, Oral, Q6H PRN, Antony Contras, MD . feeding supplement (GLUCERNA SHAKE) (GLUCERNA SHAKE) liquid 237 mL, 237 mL, Oral, TID BM, Baird Lyons, RD, 237 mL at 09/10/14 1514 . fentaNYL (SUBLIMAZE) injection 12.5-25 mcg, 12.5-25 mcg, Intravenous, Q2H PRN, Wilhelmina Mcardle, MD, 25 mcg at 09/08/14 [phone] . insulin aspart (novoLOG) injection 0-15 Units, 0-15 Units, Subcutaneous, 6 times per day, Colbert Coyer, MD, 3 Units at 09/10/14 1246 . insulin glargine (LANTUS) injection 20 Units, 20 Units, Subcutaneous, Daily, Antony Contras, MD, 20 Units at 09/10/14 1033 . labetalol (NORMODYNE,TRANDATE) injection 10 mg, 10 mg, Intravenous, Q10 min PRN, Amie Portland, MD . metoCLOPramide (REGLAN) tablet 5 mg, 5 mg, Oral, TID AC & HS, Antony Contras, MD, 5 mg at 09/10/14 1246 . multivitamin with minerals tablet 1 tablet, 1 tablet, Oral, Daily, Antony Contras, MD, 1 tablet at 09/10/14 1034 .  oxyCODONE-acetaminophen (PERCOCET/ROXICET) 5-325 MG per tablet 1-2 tablet, 1-2 tablet, Oral, Q4H PRN, Antony Contras, MD, 2 tablet at 09/10/14 0654 . phenol (CHLORASEPTIC) mouth spray 1 spray, 1 spray, Mouth/Throat, PRN, Antony Contras, MD, 1 spray at 09/09/14 2234 . RESOURCE THICKENUP CLEAR, , Oral, PRN, Antony Contras, MD . senna-docusate (Senokot-S) tablet 1 tablet, 1 tablet, Oral, QHS PRN, Amie Portland, MD . traMADol (ULTRAM) tablet 50-100 mg, 50-100 mg, Oral, BID PRN, Antony Contras, MD  Patients Current Diet: DIET - DYS 1 With honey thick liquids Precautions / Restrictions Precautions Precautions: Fall Restrictions Weight Bearing Restrictions: No   Prior Activity Level Limited Community (1-2x/wk): Independent pta.Back issues since back surgery one year ago  Took tramadol at hs 2 to 3 times per week due to legs aching.  Home Assistive Devices / Equipment Home Assistive Devices/Equipment: Eyeglasses Home Equipment: Environmental consultant - 2 wheels, Cane - single point, Bedside commode  Prior Functional Level Prior Function Level of Independence: Independent  Current Functional Level Cognition  Overall Cognitive Status: Impaired/Different from baseline Current Attention Level: Selective Orientation Level: Oriented to person, Oriented to place, Oriented to situation, Disoriented to time Following Commands: Follows one step commands consistently Safety/Judgement: Decreased awareness of safety General Comments: less lethargic than yesterday. Eyes open. participating in conversation. Making jokes with therapists.   Extremity Assessment (includes Sensation/Coordination)  Upper Extremity Assessment: LUE deficits/detail LUE Deficits / Details: grossly Brunnstrum 3-4/7 LUE Coordination: decreased fine motor, decreased gross motor  Lower Extremity Assessment: RLE deficits/detail, LLE deficits/detail RLE Deficits / Details: weakness bilaterally less then 3 gross movements (limited by  pain) RLE Coordination: decreased fine motor, decreased gross motor LLE Deficits / Details: weakness bilaterally less then 3 gross movements, 2/5 (left sided weakness > right) LLE Coordination: decreased fine motor, decreased gross  motor    ADLs  Overall ADL's : Needs assistance/impaired Eating/Feeding: Moderate assistance Eating/Feeding Details (indicate cue type and reason): honey thick liquid/pureed. daughter states pt helped to feed self and held fork Grooming: Oral care, Wash/dry face, Minimal assistance, Sitting Grooming Details (indicate cue type and reason): able to bring suction toothbrush to mouth and swab mouth. Washed face using R hand. Also able to bring left hand to face to wash mouth. Upper Body Bathing: Moderate assistance, Sitting Lower Body Bathing: Maximal assistance Upper Body Dressing : Maximal assistance Upper Body Dressing Details (indicate cue type and reason): simulated dressing Lower Body Dressing: Total assistance (with +2 mod A sit<>stand) Toilet Transfer: Moderate assistance, +2 for physical assistance Toilet Transfer Details (indicate cue type and reason): 3 musketeer technique; simulated Toileting- Clothing Manipulation and Hygiene: Total assistance (with +2 mod A sit<>stand) Toileting - Clothing Manipulation Details (indicate cue type and reason): pt aware of toileting needs. states she is unable to get to the toilet at this time. Functional mobility during ADLs: +2 for physical assistance, Moderate assistance, Cueing for sequencing (using 3 musketeer technique; pt able to take 4-5 steps from ) General ADL Comments: focus of session on trunk control in sitting. Pt able to shift weight anteriorly into upright midline position with minA at times for postural control with single UE support. Significant improvement from yesterday with ADL. Pt states her goal is to be able to get to the toilet.    Mobility  Overal bed mobility: Needs Assistance Bed Mobility:  Rolling, Sidelying to Sit Rolling: +2 for physical assistance, Mod assist Sidelying to sit: Mod assist Supine to sit: Max assist Sit to sidelying: Mod assist, +2 for physical assistance (Pt initialing sit - sidelying. a for lowering trunk onto bed) General bed mobility comments: tactile and verbal cues to faciliate pt assisting with bed mobility. Pt. participated in pushing from [place] to sit but couldn't bring up trunk. Initiated movement to roll able to pull minimally on bed rail. Able to weight shift and scoot hips to EOB minimally, min A for all the way.    Transfers  Overall transfer level: Needs assistance Equipment used: 2 person hand held assist Transfers: Sit to/from Stand, Stand Pivot Transfers Sit to Stand: Mod assist, +2 physical assistance Stand pivot transfers: Mod assist, +2 physical assistance General transfer comment: assist to maintain upright midline trunk control    Ambulation / Gait / Stairs / Wheelchair Mobility  Ambulation/Gait Ambulation/Gait assistance: Mod assist, +2 physical assistance Ambulation Distance (Feet): 4 Feet Assistive device: 2 person hand held assist Gait Pattern/deviations: Shuffle, Step-to pattern, Decreased stride length, Trunk flexed Gait velocity: decreased Gait velocity interpretation: Below normal speed for age/gender General Gait Details: no buckling of LEs, slow, guarded gait to chair    Posture / Balance Dynamic Sitting Balance Sitting balance - Comments: able to maintain balance for @ 2 minutes sitting EOB unsupported at end of session when fatigued Balance Overall balance assessment: Needs assistance Sitting-balance support: Bilateral upper extremity supported Sitting balance-Leahy Scale: Poor Sitting balance - Comments: able to maintain balance for @ 2 minutes sitting EOB unsupported at end of session when fatigued Postural control: Posterior lean, Left lateral lean Standing balance support: During functional  activity Standing balance-Leahy Scale: Poor Standing balance comment: Stood using 3 musketeer approach; pt able to maintain control of LLE in standing with minimal knee buckling; hips posterior'; posterior bias    Special needs/care consideration Bowel mgmt: Last BM 2/6 incontinent Bladder mgmt: incontinent Diabetic mgmt hgb  A1c is 12 Pt having r neck and rle pain since admit and fall with CVA. [name], Dtr, requests a lot of pain meds specifically percocet for pt which pt lethargic for two days during admit as well as Dilaudid IV and not on pta. Pt took Tramadol prn at hs 2 to 3 nights per week. Pta.    Previous Home Environment Living Arrangements: Spouse/significant other Lives With: Spouse Available Help at Discharge: Available 24 hours/day, Family Type of Home: House Home Layout: One level Home Access: Stairs to enter Entrance Stairs-Rails: None Technical brewer of Steps: 2 Bathroom Shower/Tub: Multimedia programmer: Standard Bathroom Accessibility: Yes How Accessible: Accessible via walker [place]: No Additional Comments: walk in shower and tub shower  Discharge Living Setting Plans for Discharge Living Setting: Patient's home, Lives with (comment), Other (Comment) (spouse) Type of Home at Discharge: House Discharge Home Layout: One level Discharge Home Access: Stairs to enter Entrance Stairs-Rails: None Entrance Stairs-Number of Steps: 2 Discharge Bathroom Shower/Tub: Horticulturist, commercial: Standard Discharge Bathroom Accessibility: Yes How Accessible: Accessible via walker Does the patient have any problems obtaining your medications?: No  Social/Family/Support Systems Patient Roles: Spouse, Parent Contact Information: Montine Circle, spouse Anticipated Caregiver: spouse and daughter Anticipated Caregiver's Contact Information: see above Ability/Limitations of Caregiver: supervision to min assist level Caregiver  Availability: 24/7 Discharge Plan Discussed with Primary Caregiver: Yes Is Caregiver In Agreement with Plan?: Yes Does Caregiver/Family have Issues with Lodging/Transportation while Pt is in Rehab?: No  Pt has 5 daughters. Barnetta Chapel is next of kin after spouse, but another daughter,Laura Combs usually at bedside and states she will be Mom's caregiver with her Dad at home. [name] can be overwhelming and aggressively requests Mom to be medicated for pain. Pt was receiving Dilaudid IV for 2 days due to pain in r neck and RLE since fall and on admission. Pt very lethargic. Then [name] requested Mom to have percocet as if she took pta. When clarified with pt, pt took percocet a year ago after back surgery, but took Tramadol 2 to 3 nights per week, 1 to 2 tabs at HS for pain in legs.   Goals/Additional Needs Patient/Family Goal for Rehab: supervision with PT, OT, and SLP Expected length of stay: ELOS 15 to 20 days Dietary Needs: Dysphagia 1 diet with honey thick liquids Special Service Needs: Pt with alot of pain r neck and shoulder and RLE since fall with CVA Additional Information: Pt took Tramadol at night when her legs had pain two to three times per week pta Pt/Family Agrees to Admission and willing to participate: Yes Program Orientation Provided & Reviewed with Pt/Caregiver Including Roles & Responsibilities: Yes  Decrease burden of Care through IP rehab admission: n/a  Possible need for SNF placement upon discharge:not anticipated  Patient Condition: This patient's medical and functional status has changed since the consult dated: 09/07/14 in which the Rehabilitation Physician determined and documented that the patient's condition is appropriate for intensive rehabilitative care in an inpatient rehabilitation facility. See "History of Present Illness" (above) for medical update. Functional changes are: overall max assist. Patient's medical and functional status update has been discussed with the  Rehabilitation physician and patient remains appropriate for inpatient rehabilitation. Will admit to inpatient rehab today.  Preadmission Screen Completed By: Cleatrice Burke, 09/10/2014 4:09 PM ______________________________________________________________________  Discussed status with Dr. Letta Pate on 09/10/14 at 1608 and received telephone approval for admission today.  Admission Coordinator: Cleatrice Burke, time 8416 Date 09/10/2014.  Cosigned by: [name], MD at 09/10/2014 4:21 PM  Revision History     Date/Time User Provider Type Action   09/10/2014 4:21 PM [name], MD Physician Cosign   09/10/2014 4:11 PM Cleatrice Burke, RN Rehab Admission Coordinator Sign

## 2014-09-10 NOTE — Progress Notes (Signed)
Christy Staggers, MD Physician Signed Physical Medicine and Rehabilitation Consult Note 09/07/2014 9:23 AM  Related encounter: ED to Hosp-Admission (Current) from 09/05/2014 in [place] Collapse All        Physical Medicine and Rehabilitation Consult Reason for Consult: Right MCA infarct Referring Physician: Dr. Leonie Man   HPI: Christy Simmons is a 80 y.o. right handed female with history of hypertension, diabetes mellitus and peripheral neuropathy, PFO with atrial septal aneurysm maintained on aspirin 81 mg daily. Independent prior to admission living with her husband. Presented 09/05/2014 with left-sided weakness and altered mental status. CT of the head show well-defined area of hypodensity around the right basal ganglia. Patient did not receive TPA. CT angiogram head and neck showed distal right M1 occlusion, abnormal perfusion throughout essentially the entirety of the right MCA territory. Underwent complete revascularization of occluded right MCA per interventional radiology. MRI of the brain shows multiple areas of acute infarct in the right MCA territory involving the right insula, posterior basal ganglia and right parietal lobe indicating of a cerebral MRI. Echocardiogram with ejection fraction of 34% grade 1 diastolic dysfunction. Patient did require intubation followed by critical care and extubated without difficulty 09/07/2014. She currently remains nothing by mouth. Neurology follow-up with workup ongoing. Await formal physical occupational therapy evaluations. M.D. has requested physical medicine rehabilitation consult.   Review of Systems  Gastrointestinal: Positive for constipation.   GERD  Musculoskeletal: Positive for myalgias.  Neurological: Positive for headaches.  All other systems reviewed and are negative.  Past Medical History  Diagnosis Date  . Transient ischemic attack   . HTN (hypertension)   .  Hyperlipidemia   . DM (diabetes mellitus)   . GERD (gastroesophageal reflux disease)   . Heart disease     PFO  . Amnesia 01/10/2013  . Patent foramen ovale with atrial septal aneurysm   . Complication of anesthesia   . PONV (postoperative nausea and vomiting)   . Family history of anesthesia complication     "My brother has trouble waking up."  . Glaucoma     Hx: of  . Early cataracts, bilateral     Hx: of  . Arthritis    Past Surgical History  Procedure Laterality Date  . US echocardiography  10-13-11    EF 60-65% normal  . Back surgery      disc   . Tonsillectomy    . Tee without cardioversion  10/30/2011    Procedure: TRANSESOPHAGEAL ECHOCARDIOGRAM (TEE); Surgeon: Larey Dresser, MD; Location: [place]; Service: Cardiovascular; Laterality: N/A;  . Colonoscopy w/ biopsies and polypectomy      HX;of  . Tonsillectomy    . Tubal ligation    . Lumbar laminectomy/decompression microdiscectomy N/A 09/11/2013    Procedure: LUMBAR LAMINECTOMY/DECOMPRESSION MICRODISCECTOMY 2 LEVELS; Surgeon: Ophelia Charter, MD; Location: [facility]; Service: Neurosurgery; Laterality: N/A; Lumbar Four-Five Laminectomy and Foraminotomy with redo Lumbar Five-Sacral One diskectomy   Family History  Problem Relation Age of Onset  . Stroke      No family history of such  . Congenital heart disease      No family history of such  . Anesthesia problems Neg Hx   . Diabetes Maternal Grandfather    Social History:  reports that she has never smoked. She has never used smokeless tobacco. She reports that she does not drink alcohol or use illicit drugs. Allergies:  Allergies  Allergen Reactions  . Gabapentin  Other (See Comments)    Caused depression  . Codeine     Headache   Medications Prior to Admission  Medication Sig Dispense Refill  .  aspirin EC 81 MG tablet Take 81 mg by mouth daily.    . brimonidine (ALPHAGAN) 0.2 % ophthalmic solution Place 1 drop into both eyes 2 (two) times daily.     . Calcium Citrate-Vitamin D 250-200 MG-UNIT TABS Take 1 tablet by mouth daily.    . Cholecalciferol (VITAMIN D3) 1000 UNITS CAPS Take 1,000 Units by mouth daily.    Marland Kitchen esomeprazole (NEXIUM) 20 MG capsule Take 20 mg by mouth daily.     Marland Kitchen ibuprofen (ADVIL,MOTRIN) 200 MG tablet Take 200 mg by mouth daily as needed for mild pain.     Marland Kitchen insulin glargine (LANTUS) 100 UNIT/ML injection Inject 20 Units into the skin daily.     . insulin lispro (HUMALOG) 100 UNIT/ML injection Inject into the skin 3 (three) times daily before meals. Per sliding scale - average 15 units    . lisinopril-hydrochlorothiazide (PRINZIDE,ZESTORETIC) 20-25 MG per tablet Take 1 tablet by mouth daily.    . Magnesium 200 MG TABS Take 400 mg by mouth daily.    . metoCLOPramide (REGLAN) 5 MG tablet Take 5 mg by mouth 4 (four) times daily - before meals and at bedtime.     . Misc Natural Products (OSTEO BI-FLEX ADV JOINT SHIELD) TABS Take 1 tablet by mouth daily.    . Multiple Vitamin (MULITIVITAMIN WITH MINERALS) TABS Take 1 tablet by mouth daily.    . traMADol (ULTRAM) 50 MG tablet Take 50-100 mg by mouth 2 (two) times daily as needed for moderate pain.     . diazepam (VALIUM) 5 MG tablet Take 1 tablet (5 mg total) by mouth every 6 (six) hours as needed for muscle spasms. (Patient not taking: Reported on 09/05/2014) 60 tablet 0  . oxyCODONE-acetaminophen (PERCOCET/ROXICET) 5-325 MG per tablet Take 1-2 tablets by mouth every 4 (four) hours as needed for moderate pain. (Patient not taking: Reported on 09/05/2014) 60 tablet 0    Home: Home Living Family/patient expects to be discharged to:: Private residence Living Arrangements: Spouse/significant other Type of Home: House Home Access: Stairs to  enter Technical brewer of Steps: 2 Entrance Stairs-Rails: None Home Layout: One level Home Equipment: Environmental consultant - 2 wheels, Cane - single point, Bedside commode Additional Comments: walk in shower and tub shower  Functional History:   Functional Status:  Mobility:          ADL:    Cognition: Cognition Orientation Level: Oriented X4    Blood pressure 119/55, pulse 76, temperature 98 F (36.7 C), temperature source Oral, resp. rate 12, height 5\' 4"  (1.626 m), weight 55.5 kg (122 lb 5.7 oz), SpO2 100 %. Physical Exam  HENT:  Left facial weakness  Eyes:  Pupils reactive to light without nystagmus  Neck: Normal range of motion. Neck supple. No thyromegaly present.  Cardiovascular:  Cardiac rate control  Respiratory: Effort normal and breath sounds normal.  Limited inspiratory effort  GI: Soft. Bowel sounds are normal. She exhibits no distension.  Neurological:  Pt appears fatigued but focuses with cueing. She was able to state her first and last name as well as age and date of birth. Followed simple one-step commands. Her voice is but a whisper but intelligible. Left facial droop. Moves all 4's but seems to be faster with right side---very inconsistent due to her fatigue  Skin: Skin is warm and  dry.  Psychiatric:  flat     Lab Results Last 24 Hours    Results for orders placed or performed during the hospital encounter of 09/05/14 (from the past 24 hour(s))  Glucose, capillary Status: Abnormal   Collection Time: 09/06/14 11:08 AM  Result Value Ref Range   Glucose-Capillary 111 (H) 70 - 99 mg/dL  Glucose, capillary Status: Abnormal   Collection Time: 09/06/14 3:53 PM  Result Value Ref Range   Glucose-Capillary 123 (H) 70 - 99 mg/dL   Comment 1 Notify RN    Comment 2 Documented in Chart   Glucose, capillary Status: Abnormal   Collection Time: 09/06/14 7:58 PM  Result Value Ref Range   Glucose-Capillary 55  (L) 70 - 99 mg/dL   Comment 1 Notify RN    Comment 2 Documented in Chart   Glucose, capillary Status: Abnormal   Collection Time: 09/06/14 11:08 PM  Result Value Ref Range   Glucose-Capillary 142 (H) 70 - 99 mg/dL   Comment 1 Notify RN    Comment 2 Documented in Chart   Glucose, capillary Status: Abnormal   Collection Time: 09/07/14 3:56 AM  Result Value Ref Range   Glucose-Capillary 134 (H) 70 - 99 mg/dL      Imaging Results (Last 48 hours)    Ct Angio Head W/cm &/or Wo Cm  09/05/2014 CLINICAL DATA: Left-sided paralysis. Acute stroke. EXAM: CT ANGIOGRAPHY HEAD AND NECK CT CEREBRAL PERFUSION TECHNIQUE: Multidetector CT imaging of the head and neck was performed using the standard protocol during bolus administration of intravenous contrast. Multiplanar CT image reconstructions and MIPs were obtained to evaluate the vascular anatomy. Carotid stenosis measurements (when applicable) are obtained utilizing NASCET criteria, using the distal internal carotid diameter as the denominator. CT cerebral perfusion was performed during bolus IV contrast injection. CONTRAST: 173mL OMNIPAQUE IOHEXOL 350 MG/ML SOLN COMPARISON: Head CT earlier today. Head MRI/ MRA 01/27/2013. Neck MRA 10/13/2011. FINDINGS: CTA NECK Aortic arch: 3 vessel aortic arch. The brachiocephalic and subclavian arteries are patent without stenosis. Right carotid system: Mild, predominantly noncalcified plaque is present at the right carotid bifurcation. No common carotid or cervical internal carotid artery stenosis. Left carotid system: Mild calcified and noncalcified plaque at the carotid bifurcation without common carotid or cervical internal carotid artery stenosis. Vertebral arteries:Vertebral arteries are patent without stenosis. Left vertebral artery is mildly dominant. Skeleton: Moderate to severe multilevel cervical disc degeneration. Other neck: The thyroid gland is  heterogeneous and diffusely enlarged, more so in the right lobe. Multiple nodules are present. Dominant right-sided nodule measures 3.0 cm. There is a 2.0 cm nodule in the isthmus. CTA HEAD Anterior circulation: Internal carotid arteries are patent from skullbase to carotid termini. There is mild carotid siphon calcification bilaterally without significant stenosis. There is a distal M1 occlusion on the right. Contrast is seen within M2 and more distal MCA branch vessels, likely via collateral flow, however the number of MCA branch vessels is mildly diminished compared to the contralateral side. Left MCA and bilateral ACAs are unremarkable. No intracranial aneurysm is identified. Posterior circulation: The intracranial vertebral arteries are patent to the basilar with the left being dominant. PICA origins and SCA origins are patent. Basilar artery is patent without stenosis. There are patent posterior communicating arteries bilaterally, right larger than left, and the right P1 segment is mildly hypoplastic. PCAs are otherwise unremarkable. Venous sinuses: Patent. Anatomic variants: Hypoplastic right P1. Delayed phase: No abnormal brain parenchymal or meningeal enhancement. Asymmetric enhancement of right MCA branch vessels may  reflect slow flow and collateral flow. Subtle asymmetric hypoattenuation in the right corona radiata and external capsule may reflect developing acute infarct. CT PERFUSION Cerebral perfusion images demonstrate abnormally prolonged mean transit time and time to peak throughout essentially the entirety of the right MCA territory with corresponding decreased cerebral blood flow. Cerebral blood volume is maintained throughout much of this region, with some cortical/subcortical regions demonstrating slightly elevated CBV compared to the contralateral side. The deep white matter of the right corona radiata and centrum semiovale, particularly posteriorly, as well as posterior temporal lobe  demonstrate more abnormal MTT and CBF parameters than the remainder of the MCA territory as well as slightly diminished CBV compared to the contralateral side and may reflect a developing core infarct in a deep watershed distribution. The basal ganglia are largely spared. IMPRESSION: 1. Distal right M1 occlusion. 2. Abnormal perfusion throughout essentially the entirety of the right MCA territory. Much of this appears to represent oligemic brain with relatively preserved cerebral blood volume, however there is likely a developing region of core infarct involving the deep white matter. Preliminary results were discussed in person at the time of interpretation on 09/05/2014 at 10:45 am with Dr. Dorian Pod , who verbally acknowledged these results. Electronically Signed By: Logan Bores On: 09/05/2014 11:35   Ct Head Wo Contrast  09/05/2014 CLINICAL DATA: Stroke. Left-sided weakness. Right MCA occlusion status post cerebral angiogram and revascularization. EXAM: CT HEAD WITHOUT CONTRAST TECHNIQUE: Contiguous axial images were obtained from the base of the skull through the vertex without intravenous contrast. COMPARISON: Head CT earlier today FINDINGS: There is some residual intravascular contrast present from recent catheter angiogram. There is mild contrast staining in the right basal ganglia. Subtly asymmetric hypoattenuation in the posterior right centrum semiovale and corona radiata is unchanged and could reflect early acute ischemic change or asymmetric chronic ischemia. Patchy hypodensities more anteriorly in the centrum semiovale/ corona radiata bilaterally are also unchanged and appear more chronic in nature. No definite acute large territory cortical infarct is identified. There is no evidence of acute intracranial hemorrhage, mass, midline shift, or extra-axial fluid collection. Mild generalized cerebral atrophy is noted. Orbits are unremarkable. Visualized mastoid air cells are clear.  Left sphenoid sinus fluid has mildly increased. Endotracheal and enteric tubes are partially visualized. IMPRESSION: Sequelae of interval cerebral angiography and right MCA revascularization. No acute hemorrhage. Electronically Signed By: Logan Bores On: 09/05/2014 14:41   Ct Angio Neck W/cm &/or Wo/cm  09/05/2014 CLINICAL DATA: Left-sided paralysis. Acute stroke. EXAM: CT ANGIOGRAPHY HEAD AND NECK CT CEREBRAL PERFUSION TECHNIQUE: Multidetector CT imaging of the head and neck was performed using the standard protocol during bolus administration of intravenous contrast. Multiplanar CT image reconstructions and MIPs were obtained to evaluate the vascular anatomy. Carotid stenosis measurements (when applicable) are obtained utilizing NASCET criteria, using the distal internal carotid diameter as the denominator. CT cerebral perfusion was performed during bolus IV contrast injection. CONTRAST: 166mL OMNIPAQUE IOHEXOL 350 MG/ML SOLN COMPARISON: Head CT earlier today. Head MRI/ MRA 01/27/2013. Neck MRA 10/13/2011. FINDINGS: CTA NECK Aortic arch: 3 vessel aortic arch. The brachiocephalic and subclavian arteries are patent without stenosis. Right carotid system: Mild, predominantly noncalcified plaque is present at the right carotid bifurcation. No common carotid or cervical internal carotid artery stenosis. Left carotid system: Mild calcified and noncalcified plaque at the carotid bifurcation without common carotid or cervical internal carotid artery stenosis. Vertebral arteries:Vertebral arteries are patent without stenosis. Left vertebral artery is mildly dominant. Skeleton: Moderate to severe multilevel cervical  disc degeneration. Other neck: The thyroid gland is heterogeneous and diffusely enlarged, more so in the right lobe. Multiple nodules are present. Dominant right-sided nodule measures 3.0 cm. There is a 2.0 cm nodule in the isthmus. CTA HEAD Anterior circulation: Internal carotid  arteries are patent from skullbase to carotid termini. There is mild carotid siphon calcification bilaterally without significant stenosis. There is a distal M1 occlusion on the right. Contrast is seen within M2 and more distal MCA branch vessels, likely via collateral flow, however the number of MCA branch vessels is mildly diminished compared to the contralateral side. Left MCA and bilateral ACAs are unremarkable. No intracranial aneurysm is identified. Posterior circulation: The intracranial vertebral arteries are patent to the basilar with the left being dominant. PICA origins and SCA origins are patent. Basilar artery is patent without stenosis. There are patent posterior communicating arteries bilaterally, right larger than left, and the right P1 segment is mildly hypoplastic. PCAs are otherwise unremarkable. Venous sinuses: Patent. Anatomic variants: Hypoplastic right P1. Delayed phase: No abnormal brain parenchymal or meningeal enhancement. Asymmetric enhancement of right MCA branch vessels may reflect slow flow and collateral flow. Subtle asymmetric hypoattenuation in the right corona radiata and external capsule may reflect developing acute infarct. CT PERFUSION Cerebral perfusion images demonstrate abnormally prolonged mean transit time and time to peak throughout essentially the entirety of the right MCA territory with corresponding decreased cerebral blood flow. Cerebral blood volume is maintained throughout much of this region, with some cortical/subcortical regions demonstrating slightly elevated CBV compared to the contralateral side. The deep white matter of the right corona radiata and centrum semiovale, particularly posteriorly, as well as posterior temporal lobe demonstrate more abnormal MTT and CBF parameters than the remainder of the MCA territory as well as slightly diminished CBV compared to the contralateral side and may reflect a developing core infarct in a deep watershed  distribution. The basal ganglia are largely spared. IMPRESSION: 1. Distal right M1 occlusion. 2. Abnormal perfusion throughout essentially the entirety of the right MCA territory. Much of this appears to represent oligemic brain with relatively preserved cerebral blood volume, however there is likely a developing region of core infarct involving the deep white matter. Preliminary results were discussed in person at the time of interpretation on 09/05/2014 at 10:45 am with Dr. Dorian Pod , who verbally acknowledged these results. Electronically Signed By: Logan Bores On: 09/05/2014 11:35   Mr Brain Wo Contrast  09/06/2014 CLINICAL DATA: Stroke. Right MCA occlusion with clot removal 09/05/2014. EXAM: MRI HEAD WITHOUT CONTRAST TECHNIQUE: Multiplanar, multiecho pulse sequences of the brain and surrounding structures were obtained without intravenous contrast. COMPARISON: CT 09/05/2014 FINDINGS: Multiple areas of acute infarct are identified. Scattered areas of acute infarct in the right MCA territory involving the right insula, right posterior basal ganglia, and right parietal lobe. Mild amount of hemorrhage in the right posterior putamen. Small areas of acute infarct in the left parietal cortex. Acute infarct in the PICA territory bilaterally. Small area of acute infarct in the left occipital lobe. This pattern suggests cerebral emboli. Ventricle size is normal. No shift of the midline structures. Negative for mass lesion. Air-fluid level in the sphenoid sinus. Maxillary sinuses are clear bilaterally. IMPRESSION: Multiple areas of acute infarct are present indicative of cerebral emboli. Postop re- canalization of the right MCA with a small amount of hemorrhage in the right putamen. There are scattered areas of infarct involving the right insula and right parietal lobe however there is no lobar right MCA infarct. Electronically  Signed By: Franchot Gallo M.D. On: 09/06/2014 18:33   Dg  Pelvis Portable  09/05/2014 CLINICAL DATA: Fall with right hip pain. EXAM: PORTABLE PELVIS 1-2 VIEWS COMPARISON: 12/24/2012 FINDINGS: Note that the very lateral aspect of the right trochanteric region is not imaged. There are mild symmetric degenerative changes of the hips unchanged. No evidence of acute fracture or dislocation. There are degenerative changes of the spine. There is mild fecal retention over the rectum. IMPRESSION: No acute fracture or dislocation. Note that the lateral aspect of the trochanteric region of the right femur is not completely imaged. Recommend an additional cone-down view of the right hip for complete evaluation. Electronically Signed By: Marin Olp M.D. On: 09/05/2014 10:57   Ct Cerebral Perfusion W/cm  09/05/2014 CLINICAL DATA: Left-sided paralysis. Acute stroke. EXAM: CT ANGIOGRAPHY HEAD AND NECK CT CEREBRAL PERFUSION TECHNIQUE: Multidetector CT imaging of the head and neck was performed using the standard protocol during bolus administration of intravenous contrast. Multiplanar CT image reconstructions and MIPs were obtained to evaluate the vascular anatomy. Carotid stenosis measurements (when applicable) are obtained utilizing NASCET criteria, using the distal internal carotid diameter as the denominator. CT cerebral perfusion was performed during bolus IV contrast injection. CONTRAST: 138mL OMNIPAQUE IOHEXOL 350 MG/ML SOLN COMPARISON: Head CT earlier today. Head MRI/ MRA 01/27/2013. Neck MRA 10/13/2011. FINDINGS: CTA NECK Aortic arch: 3 vessel aortic arch. The brachiocephalic and subclavian arteries are patent without stenosis. Right carotid system: Mild, predominantly noncalcified plaque is present at the right carotid bifurcation. No common carotid or cervical internal carotid artery stenosis. Left carotid system: Mild calcified and noncalcified plaque at the carotid bifurcation without common carotid or cervical internal carotid artery  stenosis. Vertebral arteries:Vertebral arteries are patent without stenosis. Left vertebral artery is mildly dominant. Skeleton: Moderate to severe multilevel cervical disc degeneration. Other neck: The thyroid gland is heterogeneous and diffusely enlarged, more so in the right lobe. Multiple nodules are present. Dominant right-sided nodule measures 3.0 cm. There is a 2.0 cm nodule in the isthmus. CTA HEAD Anterior circulation: Internal carotid arteries are patent from skullbase to carotid termini. There is mild carotid siphon calcification bilaterally without significant stenosis. There is a distal M1 occlusion on the right. Contrast is seen within M2 and more distal MCA branch vessels, likely via collateral flow, however the number of MCA branch vessels is mildly diminished compared to the contralateral side. Left MCA and bilateral ACAs are unremarkable. No intracranial aneurysm is identified. Posterior circulation: The intracranial vertebral arteries are patent to the basilar with the left being dominant. PICA origins and SCA origins are patent. Basilar artery is patent without stenosis. There are patent posterior communicating arteries bilaterally, right larger than left, and the right P1 segment is mildly hypoplastic. PCAs are otherwise unremarkable. Venous sinuses: Patent. Anatomic variants: Hypoplastic right P1. Delayed phase: No abnormal brain parenchymal or meningeal enhancement. Asymmetric enhancement of right MCA branch vessels may reflect slow flow and collateral flow. Subtle asymmetric hypoattenuation in the right corona radiata and external capsule may reflect developing acute infarct. CT PERFUSION Cerebral perfusion images demonstrate abnormally prolonged mean transit time and time to peak throughout essentially the entirety of the right MCA territory with corresponding decreased cerebral blood flow. Cerebral blood volume is maintained throughout much of this region, with some  cortical/subcortical regions demonstrating slightly elevated CBV compared to the contralateral side. The deep white matter of the right corona radiata and centrum semiovale, particularly posteriorly, as well as posterior temporal lobe demonstrate more abnormal MTT and CBF parameters  than the remainder of the MCA territory as well as slightly diminished CBV compared to the contralateral side and may reflect a developing core infarct in a deep watershed distribution. The basal ganglia are largely spared. IMPRESSION: 1. Distal right M1 occlusion. 2. Abnormal perfusion throughout essentially the entirety of the right MCA territory. Much of this appears to represent oligemic brain with relatively preserved cerebral blood volume, however there is likely a developing region of core infarct involving the deep white matter. Preliminary results were discussed in person at the time of interpretation on 09/05/2014 at 10:45 am with Dr. Dorian Pod , who verbally acknowledged these results. Electronically Signed By: Logan Bores On: 09/05/2014 11:35   Dg Chest Port 1v Same Day  09/05/2014 CLINICAL DATA: Respiratory failure. EXAM: PORTABLE CHEST - 1 VIEW SAME DAY COMPARISON: 09/05/2014 at 0838 hr. FINDINGS: Endotracheal tube is in satisfactory position, terminating 5.1 cm above the [place]. Nasogastric tube is followed into the stomach. Lungs are clear. A skin fold projects over the upper left hemi thorax. No pleural fluid. IMPRESSION: No acute findings. Electronically Signed By: Lorin Picket M.D. On: 09/05/2014 19:00   Dg Hip Unilat With Pelvis 2-3 Views Right  09/05/2014 CLINICAL DATA: 80 year old female with right hip pain. Patient fell in bathroom earlier today. EXAM: RIGHT HIP (WITH PELVIS) 2-3 VIEWS COMPARISON: None. FINDINGS: No evidence of acute fracture or malalignment. The femoral head is located. The bones appear osteopenic. Foley catheter present within the bladder. IMPRESSION: No  evidence of acute fracture or malalignment Electronically Signed By: Jacqulynn Cadet M.D. On: 09/05/2014 12:05     Assessment/Plan: Diagnosis: right basal ganglia infarct 1. Does the need for close, 24 hr/day medical supervision in concert with the patient's rehab needs make it unreasonable for this patient to be served in a less intensive setting? Yes 2. Co-Morbidities requiring supervision/potential complications: acute respiratory failure, dysphagia 3. Due to bladder management, bowel management, safety, skin/wound care, disease management, medication administration, pain management and patient education, does the patient require 24 hr/day rehab nursing? Yes 4. Does the patient require coordinated care of a physician, rehab nurse, PT (1-2 hrs/day, 5 days/week), OT (1-2 hrs/day, 5 days/week) and SLP (1-2 hrs/day, 5 days/week) to address physical and functional deficits in the context of the above medical diagnosis(es)? Yes Addressing deficits in the following areas: balance, endurance, locomotion, strength, transferring, bowel/bladder control, bathing, dressing, feeding, grooming, toileting, cognition, speech, swallowing and psychosocial support 5. Can the patient actively participate in an intensive therapy program of at least 3 hrs of therapy per day at least 5 days per week? Yes 6. The potential for patient to make measurable gains while on inpatient rehab is excellent 7. Anticipated functional outcomes upon discharge from inpatient rehab are supervision with PT, supervision with OT, supervision with SLP. 8. Estimated rehab length of stay to reach the above functional goals is: 15-20 days 9. Does the patient have adequate social supports and living environment to accommodate these discharge functional goals? Yes 10. Anticipated D/C setting: Home 11. Anticipated post D/C treatments: HH therapy and outpt therapies 12. Overall Rehab/Functional Prognosis:  excellent  RECOMMENDATIONS: This patient's condition is appropriate for continued rehabilitative care in the following setting: CIR Patient has agreed to participate in recommended program. Yes Note that insurance prior authorization may be required for reimbursement for recommended care.  Comment: Rehab Admissions Coordinator to follow up.  Thanks,  Christy Staggers, MD, Mellody Drown     09/07/2014       Revision History  Date/Time User Provider Type Action   09/07/2014 1:58 PM Christy Staggers, MD Physician Sign   09/07/2014 9:44 AM Cathlyn Parsons, PA-C Physician Assistant Pend   View Details Report       Routing History     Date/Time From To Method   09/07/2014 1:58 PM Christy Staggers, MD Christy Staggers, MD In Basket   09/07/2014 1:58 PM Christy Staggers, MD Leonides Sake, MD Fax

## 2014-09-10 NOTE — Psychosocial Assessment (Signed)
Clinical Social Work Department BRIEF PSYCHOSOCIAL ASSESSMENT 09/10/2014  Patient:  Christy Simmons, Christy Simmons     Account Number:  [id number]     Admit date:  09/05/2014  Clinical Social Worker:  Terrel Nesheiwat, [place]  Date/Time:  09/10/2014 02:48 PM  Referred by:  Physician  Date Referred:  09/10/2014 Referred for  Psychosocial assessment   Other Referral:   Interview type:  Other - See comment Other interview type:   SW talked with pt and family at bedside.    PSYCHOSOCIAL DATA Living Status:  HUSBAND Admitted from facility:   Level of care:   Primary support name:  Christy Simmons Primary support relationship to patient:  FAMILY Degree of support available:   SW talked with pt and family regarding care after D/C. Family is very supportive. Christy Simmons (daughter) lives a mile away from pt and is willing  to help pt at home. Christy Simmons is willing to move into pts home to help out if she decides to go home with home health. Christy Simmons is also very supportive and agrees with pt wishes. Pt lives with her husband Christy Simmons who is not able to take care of pt. SW will follow up with husband. Sw tried calling husband but no answer or voicemail option. Patient and family adamantly request CIR placement for short term rehab and at present CIR is in denied.  A Peer to Peer has been requested.    CURRENT CONCERNS Current Concerns  Post-Acute Placement   Other Concerns:    SOCIAL WORK ASSESSMENT / PLAN Sw talked with pt and family regarding SNF placement. Pt declined SNF. She stated if CIR declined her that she wants to go home with home health. SW will follow up with husband to follow up. SW supervisor notified Christy Simmons- RNCM and Christy Simmons of above.  CSW services will sign off. Christy Simmons. Christy Good, LCSW  [place] [address]   Assessment/plan status:  Psychosocial Support/Ongoing Assessment of Needs Other assessment/ plan:   Information/referral to community resources:   SNF list left  just incase Pt changes her mind about care.    PATIENT'S/FAMILY'S RESPONSE TO PLAN OF CARE: Family is very supportive. Family is willing to help her at home and provide care if she d/c home with home health. Family would not leave pt unsupervised. Pt did not want to go to SNF. She only wants to go to CIR or home. Pt and family are very sweet. Pt seemed tired and drowsy.

## 2014-09-10 NOTE — PMR Pre-admission (Signed)
PMR Admission Coordinator Pre-Admission Assessment  Patient: Christy Simmons is an 80 y.o., female MRN: [id number] DOB: [birth year] Height: 5\' 4"  (162.6 cm) Weight: 55.5 kg (122 lb 5.7 oz)              Insurance Information HMO: yes    PPO:      PCP:      IPA:      80/20:      OTHER: medicare replacement PRIMARY: Christy Simmons      Policy#: [id number]      Subscriber: pt CM Name: Christy Simmons      Phone#: [phone]     Fax#: [phone] Pre-Cert#: [id number]      Employer: retired onsite f/i Christy Simmons [phone] Benefits:  Phone #: [phone]     Name: 2.10.16 Eff. Date: 07/31/14     Deduct: none      Out of Pocket Max: [phone]      Life Max: none CIR: $430 per day days 1-4      SNF: no copay days 1-20; $160 per day days 21-62; no copay days 63-100 Outpatient: $40 copay per visit     Co-Pay: no visit limit Home Health: 100%      Co-Pay: no visit limit DME: 80%     Co-Pay: 20% Providers: in [employer] originally denied request for inpt admit but appealed and decision was overturned.   SECONDARY: none       Medicaid Application Date:       Case Manager:  Disability Application Date:       Case Worker:   Emergency Contact Information Contact Information    Name Relation Home Work Christy Simmons [phone]  Christy Simmons Daughter [phone]  [phone]     Current Medical History  Patient Admitting Diagnosis: right basal ganglia infarct  History of Present Illness:Christy Simmons is a 80 y.o. right handed female with history of hypertension, diabetes mellitus and peripheral neuropathy, PFO with atrial septal aneurysm maintained on aspirin 81 mg daily. Independent prior to admission living with her husband.   Presented 09/05/2014 with left-sided weakness and altered mental status. CT of the head show well-defined area of hypodensity around the right basal ganglia. Patient did not receive TPA. CT angiogram head and neck showed distal right M1  occlusion, abnormal perfusion throughout essentially the entirety of the right MCA territory. Underwent complete revascularization of occluded right MCA per interventional radiology. MRI of the brain shows multiple areas of acute infarct in the right MCA territory involving the right insula, posterior basal ganglia and right parietal lobe indicating of a cerebral MRI. Echocardiogram with ejection fraction of 23% grade 1 diastolic dysfunction. Patient did require intubation followed by critical care and extubated without difficulty 09/07/2014.  Lower extremity dopplers with venous clots and need to hold anticoagulation due to hemorhagic brain infarct. IVC filter placed 09/09/14. Repeat head 2/20 shows persistent 1 cm hemorrhagic component of right basal ganglia infarct.  Asymmetric enlargement of the right lobe of the thyroid and thyroid isthmus with suspected thyroid nodules as detailed above. While these nodules appear grossly similar to compared to the 07/2010 thyroid ultrasound, further evaluation could be performed with repeat dedicated non emergent thyroid ultrasound as clinically indicated.    Total: 6 NIH    Past Medical History  Past Medical History  Diagnosis Date  . Transient ischemic attack   . HTN (hypertension)   . Hyperlipidemia   . DM (diabetes mellitus)   . GERD (  gastroesophageal reflux disease)   . Heart disease     PFO  . Amnesia 01/10/2013  . Patent foramen ovale with atrial septal aneurysm   . Complication of anesthesia   . PONV (postoperative nausea and vomiting)   . Family history of anesthesia complication     "My brother has trouble waking up."  . Glaucoma     Hx: of  . Early cataracts, bilateral     Hx: of  . Arthritis     Family History  family history includes Congenital heart disease in an other family member; Diabetes in her maternal grandfather; Stroke in an other family member. There is no history of Anesthesia problems.  Prior Rehab/Hospitalizations:  home health after back surgeries  Current Medications   Current facility-administered medications:  .  0.9 % NaCl with KCl 20 mEq/ L  infusion, , Intravenous, Continuous, Christy Mcardle, MD, Last Rate: 75 mL/hr at 09/10/14 1032 .  acetaminophen (TYLENOL) tablet 650 mg, 650 mg, Oral, Q4H PRN **OR** acetaminophen (TYLENOL) suppository 650 mg, 650 mg, Rectal, Q4H PRN, Amie Portland, MD .  aspirin chewable tablet 81 mg, 81 mg, Oral, Daily, Donzetta Starch, NP, 81 mg at 09/10/14 1034 .  brimonidine (ALPHAGAN) 0.2 % ophthalmic solution 1 drop, 1 drop, Both Eyes, BID, Antony Contras, MD, 1 drop at 09/10/14 1033 .  calcium-vitamin D (OSCAL WITH D) 500-200 MG-UNIT per tablet 1 tablet, 1 tablet, Oral, Q breakfast, Arty Baumgartner, [facility], 1 tablet at 09/10/14 [phone] .  diazepam (VALIUM) tablet 5 mg, 5 mg, Oral, Q6H PRN, Antony Contras, MD .  feeding supplement (GLUCERNA SHAKE) (GLUCERNA SHAKE) liquid 237 mL, 237 mL, Oral, TID BM, Baird Lyons, RD, 237 mL at 09/10/14 1514 .  fentaNYL (SUBLIMAZE) injection 12.5-25 mcg, 12.5-25 mcg, Intravenous, Q2H PRN, Christy Mcardle, MD, 25 mcg at 09/08/14 [phone] .  insulin aspart (novoLOG) injection 0-15 Units, 0-15 Units, Subcutaneous, 6 times per day, Colbert Coyer, MD, 3 Units at 09/10/14 1246 .  insulin glargine (LANTUS) injection 20 Units, 20 Units, Subcutaneous, Daily, Antony Contras, MD, 20 Units at 09/10/14 1033 .  labetalol (NORMODYNE,TRANDATE) injection 10 mg, 10 mg, Intravenous, Q10 min PRN, Amie Portland, MD .  metoCLOPramide (REGLAN) tablet 5 mg, 5 mg, Oral, TID AC & HS, Antony Contras, MD, 5 mg at 09/10/14 1246 .  multivitamin with minerals tablet 1 tablet, 1 tablet, Oral, Daily, Antony Contras, MD, 1 tablet at 09/10/14 1034 .  oxyCODONE-acetaminophen (PERCOCET/ROXICET) 5-325 MG per tablet 1-2 tablet, 1-2 tablet, Oral, Q4H PRN, Antony Contras, MD, 2 tablet at 09/10/14 0654 .  phenol (CHLORASEPTIC) mouth spray 1 spray, 1 spray, Mouth/Throat, PRN, Antony Contras, MD, 1 spray at 09/09/14 2234 .  RESOURCE THICKENUP CLEAR, , Oral, PRN, Antony Contras, MD .  senna-docusate (Senokot-S) tablet 1 tablet, 1 tablet, Oral, QHS PRN, Amie Portland, MD .  traMADol (ULTRAM) tablet 50-100 mg, 50-100 mg, Oral, BID PRN, Antony Contras, MD  Patients Current Diet: DIET - DYS 1  With honey thick liquids Precautions / Restrictions Precautions Precautions: Fall Restrictions Weight Bearing Restrictions: No   Prior Activity Level Limited Community (1-2x/wk): Independent pta.Back issues since back surgery one year ago  Took tramadol at hs 2 to 3 times per week due to legs aching.  Home Assistive Devices / Equipment Home Assistive Devices/Equipment: Eyeglasses Home Equipment: Walker - 2 wheels, Cane - single point, Bedside commode  Prior Functional Level Prior Function Level of Independence: Independent  Current Functional Level  Cognition  Overall Cognitive Status: Impaired/Different from baseline Current Attention Level: Selective Orientation Level: Oriented to person, Oriented to place, Oriented to situation, Disoriented to time Following Commands: Follows one step commands consistently Safety/Judgement: Decreased awareness of safety General Comments: less lethargic than yesterday. Eyes open. participating in conversation. Making jokes with therapists.    Extremity Assessment (includes Sensation/Coordination)  Upper Extremity Assessment: LUE deficits/detail LUE Deficits / Details: grossly Brunnstrum 3-4/7 LUE Coordination: decreased fine motor, decreased gross motor  Lower Extremity Assessment: RLE deficits/detail, LLE deficits/detail RLE Deficits / Details: weakness bilaterally less then 3 gross movements (limited by pain) RLE Coordination: decreased fine motor, decreased gross motor LLE Deficits / Details: weakness bilaterally less then 3 gross movements, 2/5 (left sided weakness > right) LLE Coordination: decreased fine motor, decreased gross motor     ADLs  Overall ADL's : Needs assistance/impaired Eating/Feeding: Moderate assistance Eating/Feeding Details (indicate cue type and reason): honey thick liquid/pureed. daughter states pt helped to feed self and held fork Grooming: Oral care, Wash/dry face, Minimal assistance, Sitting Grooming Details (indicate cue type and reason): able to bring suction toothbrush to mouth and swab mouth. Washed face using R hand. Also able to bring left hand to face to wash mouth. Upper Body Bathing: Moderate assistance, Sitting Lower Body Bathing: Maximal assistance Upper Body Dressing : Maximal assistance Upper Body Dressing Details (indicate cue type and reason): simulated dressing Lower Body Dressing: Total assistance (with +2 mod A sit<>stand) Toilet Transfer: Moderate assistance, +2 for physical assistance Toilet Transfer Details (indicate cue type and reason): 3 musketeer technique; simulated Toileting- Clothing Manipulation and Hygiene: Total assistance (with +2 mod A sit<>stand) Toileting - Clothing Manipulation Details (indicate cue type and reason): pt aware of toileting needs. states she is unable to get to the toilet at this time. Functional mobility during ADLs: +2 for physical assistance, Moderate assistance, Cueing for sequencing (using 3 musketeer technique; pt able to take 4-5 steps from ) General ADL Comments: focus of session on trunk control in sitting. Pt able to shift weight anteriorly into upright midline position with minA at times for postural control with single UE support. Significant improvement from yesterday with ADL. Pt states her goal is to be able to get to the toilet.    Mobility  Overal bed mobility: Needs Assistance Bed Mobility: Rolling, Sidelying to Sit Rolling: +2 for physical assistance, Mod assist Sidelying to sit: Mod assist Supine to sit: Max assist Sit to sidelying: Mod assist, +2 for physical assistance (Pt initialing sit - sidelying. a for lowering trunk onto  bed) General bed mobility comments: tactile and verbal cues to faciliate pt assisting with bed mobility. Pt. participated in pushing from [place] to sit but couldn't bring up trunk. Initiated movement to roll able to pull  minimally on bed rail. Able to weight shift and scoot hips to EOB minimally, min A  for all the way.    Transfers  Overall transfer level: Needs assistance Equipment used: 2 person hand held assist Transfers: Sit to/from Stand, Stand Pivot Transfers Sit to Stand: Mod assist, +2 physical assistance Stand pivot transfers: Mod assist, +2 physical assistance General transfer comment: assist to maintain upright midline trunk control    Ambulation / Gait / Stairs / Wheelchair Mobility  Ambulation/Gait Ambulation/Gait assistance: Mod assist, +2 physical assistance Ambulation Distance (Feet): 4 Feet Assistive device: 2 person hand held assist Gait Pattern/deviations: Shuffle, Step-to pattern, Decreased stride length, Trunk flexed Gait velocity: decreased Gait velocity interpretation: Below normal speed for age/gender General Gait  Details: no buckling of LEs, slow, guarded gait to chair    Posture / Balance Dynamic Sitting Balance Sitting balance - Comments: able to maintain balance for @ 2 minutes sitting EOB unsupported at end of session when fatigued Balance Overall balance assessment: Needs assistance Sitting-balance support: Bilateral upper extremity supported Sitting balance-Leahy Scale: Poor Sitting balance - Comments: able to maintain balance for @ 2 minutes sitting EOB unsupported at end of session when fatigued Postural control: Posterior lean, Left lateral lean Standing balance support: During functional activity Standing balance-Leahy Scale: Poor Standing balance comment: Stood using 3 musketeer approach; pt able to maintain control of LLE in standing with minimal knee buckling; hips posterior'; posterior bias    Special needs/care consideration Bowel mgmt: Last BM  2/6 incontinent Bladder mgmt: incontinent Diabetic mgmt hgb A1c is 12 Pt having r neck and rle pain since admit and fall with CVA. [name], Dtr, requests a lot of pain meds specifically percocet for pt which pt lethargic for two days during admit as well as Dilaudid IV and not on pta. Pt took Tramadol prn at hs 2 to 3 nights per week. Pta.    Previous Home Environment Living Arrangements: Simmons/significant other  Lives With: Simmons Available Help at Discharge: Available 24 hours/day, Family Type of Home: House Home Layout: One level Home Access: Stairs to enter Entrance Stairs-Rails: None Technical brewer of Steps: 2 Bathroom Shower/Tub: Multimedia programmer: Standard Bathroom Accessibility: Yes How Accessible: Accessible via walker [place]: No Additional Comments: walk in shower and tub shower  Discharge Living Setting Plans for Discharge Living Setting: Patient's home, Lives with (comment), Other (Comment) (Simmons) Type of Home at Discharge: House Discharge Home Layout: One level Discharge Home Access: Stairs to enter Entrance Stairs-Rails: None Entrance Stairs-Number of Steps: 2 Discharge Bathroom Shower/Tub: Horticulturist, commercial: Standard Discharge Bathroom Accessibility: Yes How Accessible: Accessible via walker Does the patient have any problems obtaining your medications?: No  Social/Family/Support Systems Patient Roles: Simmons, Parent Contact Information: [address], Simmons Anticipated Caregiver: Simmons and daughter Anticipated Caregiver's Contact Information: see above Ability/Limitations of Caregiver: supervision to min assist level Caregiver Availability: 24/7 Discharge Plan Discussed with Primary Caregiver: Yes Is Caregiver In Agreement with Plan?: Yes Does Caregiver/Family have Issues with Lodging/Transportation while Pt is in Rehab?: No  Pt has 5 daughters. Barnetta Chapel is next of kin after Simmons, but another  daughter,Laura Combs usually at bedside and states she will be Mom's caregiver with her Dad at home. [name] can be overwhelming and aggressively requests Mom to be medicated for pain. Pt was receiving Dilaudid IV for 2 days due to pain in r neck and RLE since fall and on admission. Pt very lethargic. Then [name] requested Mom to have percocet as if she took pta. When clarified with pt, pt took percocet a year ago after back surgery, but took Tramadol 2 to 3 nights per week, 1 to 2 tabs at HS for pain in legs.   Goals/Additional Needs Patient/Family Goal for Rehab: supervision with PT, OT, and SLP Expected length of stay: ELOS 15 to 20 days Dietary Needs: Dysphagia 1 diet with honey thick liquids Special Service Needs: Pt with alot of pain r neck and shoulder and RLE since fall with CVA Additional Information: Pt took Tramadol at night when her legs had pain two to three times per week pta Pt/Family Agrees to Admission and willing to participate: Yes Program Orientation Provided & Reviewed with Pt/Caregiver Including Roles  & Responsibilities: Yes  Decrease burden of Care through IP rehab admission: n/a  Possible need for SNF placement upon discharge:not anticipated  Patient Condition: This patient's medical and functional status has changed since the consult dated: 09/07/14 in which the Rehabilitation Physician determined and documented that the patient's condition is appropriate for intensive rehabilitative care in an inpatient rehabilitation facility. See "History of Present Illness" (above) for medical update. Functional changes are: overall max assist. Patient's medical and functional status update has been discussed with the Rehabilitation physician and patient remains appropriate for inpatient rehabilitation. Will admit to inpatient rehab today.  Preadmission Screen Completed By:  Cleatrice Burke, 09/10/2014 4:09 PM ______________________________________________________________________    Discussed status with Dr. Letta Pate on 09/10/14 at  1608 and received telephone approval for admission today.  Admission Coordinator:  Cleatrice Burke, time 8119 Date 09/10/2014.

## 2014-09-10 NOTE — Progress Notes (Signed)
STROKE TEAM PROGRESS NOTE   HISTORY Christy Simmons is a 80 y.o. female with a past medical history significant for HTN, hyperlipidemia, DM, TIA, PFO with atrial septal aneurysm, brought in via EMS as a code stroke due to acute onset of left hemiparesis, decreased responsiveness. Patient was last known well around 8 pm last night. Husband stated that she hasn't been feeling well in the last month, and this morning he found her in the bathroom around 6 or 6:30 unable to move the left side and not fully responsive. He thinks that she got up to to the bathroom probably around 4 or 4:30 am. Upon arrival to the ED she was able to answer simple questions, and had NIHSS 16. CT brain showed an area of well defined area of hypodensity around the right basal ganglia.  Date last known well: 09/04/14 Time last known well: 8 pm tPA Given: no, out of the window for IV tpa, but patient taken to the IR suite for endovascular treatment. NIHSS: 16    SUBJECTIVE (INTERVAL HISTORY)  Patient had IVC filter placed y`day.. Patient had been a full assist with therapist when last assessed 2 days ago but today she is quite awake and alert and cooperative and is likely to do better. Rehabilitation transfer is on hold pending my discussion with insurance medical Dir. OBJECTIVE Temp:  [97.6 F (36.4 C)-99 F (37.2 C)] 98.8 F (37.1 C) (02/11 1343) Pulse Rate:  [63-79] 64 (02/11 1343) Cardiac Rhythm:  [-] Normal sinus rhythm (02/11 0749) Resp:  [16-18] 18 (02/11 1343) BP: (94-116)/(49-62) 100/59 mmHg (02/11 1343) SpO2:  [91 %-100 %] 100 % (02/11 1343)   Recent Labs Lab 09/09/14 2009 09/09/14 2353 09/10/14 0404 09/10/14 0752 09/10/14 1118  GLUCAP 257* 190* 150* 161* 165*    Recent Labs Lab 09/05/14 0744 09/05/14 0755 09/05/14 1302 09/06/14 0300  NA 129* 130* 130* 137  K 4.4 4.5 3.8 3.5  CL 95* 97  --  106  CO2 22  --   --  24  GLUCOSE 518* 510*  --  129*  BUN 27* 32*  --  20  CREATININE 1.02 0.80  --   0.84  CALCIUM 9.0  --   --  8.2*    Recent Labs Lab 09/05/14 0744  AST 30  ALT 23  ALKPHOS 71  BILITOT 0.5  PROT 6.3  ALBUMIN 3.6    Recent Labs Lab 09/05/14 0744 09/05/14 0755 09/05/14 1302 09/06/14 0300  WBC 23.6*  --   --  14.6*  NEUTROABS 19.4*  --   --  10.5*  HGB 13.6 15.3* 12.2 10.4*  HCT 38.8 45.0 36.0 30.0*  MCV 84.0  --   --  83.1  PLT 154  --   --  130*    Recent Labs Lab 09/05/14 0749 09/05/14 1508 09/05/14 2055 09/06/14 0300  CKTOTAL 89  --   --   --   TROPONINI 0.18* 1.15* 1.34* 1.50*   No results for input(s): LABPROT, INR in the last 72 hours. No results for input(s): COLORURINE, LABSPEC, [place], GLUCOSEU, HGBUR, BILIRUBINUR, KETONESUR, PROTEINUR, UROBILINOGEN, NITRITE, LEUKOCYTESUR in the last 72 hours.  Invalid input(s): APPERANCEUR     Component Value Date/Time   CHOL 180 09/06/2014 0300   TRIG 135 09/06/2014 0300   HDL 52 09/06/2014 0300   CHOLHDL 3.5 09/06/2014 0300   VLDL 27 09/06/2014 0300   LDLCALC 101* 09/06/2014 0300   Lab Results  Component Value Date   HGBA1C 12.0*  09/06/2014      Component Value Date/Time   LABOPIA NONE DETECTED 09/05/2014 0820   COCAINSCRNUR NONE DETECTED 09/05/2014 0820   LABBENZ NONE DETECTED 09/05/2014 0820   AMPHETMU NONE DETECTED 09/05/2014 0820   THCU NONE DETECTED 09/05/2014 0820   LABBARB NONE DETECTED 09/05/2014 0820     Recent Labs Lab 09/05/14 0744  ETH <5    Ct Angio Head and Neck W/cm &/or Wo Cm 09/05/2014    1. Distal right M1 occlusion.  2. Abnormal perfusion throughout essentially the entirety of the right MCA territory. Much of this appears to represent oligemic brain with relatively preserved cerebral blood volume, however there is likely a developing region of core infarct involving the deep white matter.      Ct Head Wo Contrast 09/05/2014    Sequelae of interval cerebral angiography and right MCA revascularization. No acute hemorrhage.        Ct Head and cervical spine  Wo Contrast 09/05/2014    1. Interval development of ill-defined hypo densities within the bilateral centrum semiovale, right greater than left, favored to represent progressive age-indeterminate though at least subacute microvascular ischemic disease continued since remote examination performed 11/2012. Otherwise, no definite acute intracranial process.  2. No fracture or static subluxation of the cervical spine.  3. Moderate to severe multilevel cervical spine DDD, worse at C4-C5, C5-C6 and C6-C7.  4. Asymmetric enlargement of the right lobe of the thyroid and thyroid isthmus with suspected thyroid nodules as detailed above. While these nodules appear grossly similar to compared to the 07/2010 thyroid ultrasound, further evaluation could be performed with repeat dedicated non emergent thyroid ultrasound as clinically indicated.     Dg Pelvis Portable 09/05/2014    No acute fracture or dislocation.  Note that the lateral aspect of the trochanteric region of the right femur is not completely imaged. Recommend an additional cone-down view of the right hip for complete evaluation.      Dg Hip Unilat With Pelvis 2-3 Views Right 09/05/2014    No evidence of acute fracture or malalignment        Ct Cerebral Perfusion W/cm 09/05/2014    1. Distal right M1 occlusion.  2. Abnormal perfusion throughout essentially the entirety of the right MCA territory. Much of this appears to represent oligemic brain with relatively preserved cerebral blood volume, however there is likely a developing region of core infarct involving the deep white matter.     Dg Chest Port 1 View 09/05/2014    No acute cardiopulmonary disease.      Dg Chest Port 1v Same Day 09/05/2014    No acute findings.     Interventional Radiology Procedure 09/05/2014 S/P rt vert artery angiogram and RT common carotid areriogram,followed by complete revascularization of occluded RT MCA prox with x1 pass with the Solitaire FR 85mm x 40 mm  retrieval device and 5 mg of superselective INTEGRELIN. TICI 3 flow restored  CT Head wo 09/09/2014 : Expected evolution of embolic cerebral and cerebellar infarcts. Hemorrhagic conversion in the right putamen is stable; no evidence of new ischemia.  PHYSICAL EXAM Frail elderly Caucasian lady. . Afebrile. Head is nontraumatic. Neck is supple without bruit. Cardiac exam no murmur or gallop. Lungs are clear to auscultation. Distal pulses are well felt.Rt IJ bandage at site of IVC filter insertion site Neurological Exam :   Patient is is awake and alert. Speech is slow and hesitant but clearly without dysarthria or aphasia. She follows midline commands and simple one-step  commands in all 4 extremities. Slight right gaze preference but able to look to the left past midline. Pupils equal reactive. Fundi were not visualized. Left lower facial weakness. Tongue midline. Left hemiparesis 3/5 strength. With distal greater than proximal weakness Right lower extremity exam limited due to hip pain Right plantar is equivocal left upgoing. Sensation and coordination mildly impaired on the left . Gait deferred  ASSESSMENT/PLAN Christy Simmons is a 80 y.o. female with history of HTN, hyperlipidemia, DM, TIA, PFO with atrial septal aneurysm, presenting with left hemiparesis, decreased responsiveness. She did not receive IV t-PA - out of the window for IV tpa, but patient taken to the IR suite for endovascular treatment..   Stroke:  Non-dominant. Right hemisphere from right MCA occlusion status post revascularization with 5 mg intra-arterial Integrilin and solitaire device from paradoxical embolism from LE DVT and atrial septal defect  Resultant  left hemiparesis. MRI  Multiple areas of acute infarct are present indicative of cerebral Emboli. re- canalization of the right MCA with a small amount of hemorrhage in the right putamen. There are scattered areas of infarct involving the right insula and right parietal  lobe however there is no lobar right MCA infarct.  Carotid Doppler - See CT angiogram of the neck.  2D Echo pending  LDL 101  HgbA1c 12  SCDs for VTE prophylaxis  DIET - DYS 1 no liquids  aspirin 81 mg orally every day prior to admission, now on no antithrombotic  .  Ongoing aggressive stroke risk factor management  Therapy recommendations:  CLR  Disposition:  CLR  Hypertension  Home meds: Lisinopril-hydrochlorothiazide  BP low at times.   Hyperlipidemia  Home meds: No lipid lowering medications prior to admission.  LDL 101, goal < 70  Add statin once swallowing has been cleared.  Continue statin at discharge  Diabetes  HgbA1c pending, goal < 7.0  Uncontrolled  Other Stroke Risk Factors  Advanced age  Hx TIA  PFO- check LE venous dopplers for DVT   Other Active Problems  Anemia  Leukocytosis  Intubated  Elevated troponin levels  DVT right popliteal vein but at risk for anticoagulation due to hemorrhagic infarct- hence placed IVC filter  Other Pertinent History    Hospital day # 5  Transfer to rehab when approved. Repeat head CT in 1 week and if isodense infarct and no blood will change to NOAC D/w family at bedside and [place] Naval architect rehab decision   Antony Contras, [place] Pager: [phone] 09/10/2014 2:29 PM     To contact Stroke Continuity provider, please refer to [email or web address] After hours, contact General Neurology

## 2014-09-10 NOTE — Progress Notes (Signed)
Received pt. From [facility] [place].Pt. And family were oriented to unit and routine.Safety plan was explained.Fall prevention plan was sign by pt's daughter and the RN.Pt. Skin is free of pressure wound.Pt. Has a dressing on Rt. Neck,dry and intact;and an incision on Rt. Groin with 2x2 and tape.

## 2014-09-10 NOTE — Progress Notes (Signed)
Physical Therapy Treatment Patient Details Name: VEEDA VIRGO MRN: [id number] DOB: 1935-04-06 Today's Date: 09/10/2014    History of Present Illness 80 y.o. female with history of HTN, hyperlipidemia, DM, TIA, PFO with atrial septal aneurysm, presenting with left hemiparesis, decreased responsiveness. pt s/p right hemisphere from right MCA occlusion status post revascularization.    PT Comments    Pt. Able to follow commands better today. Her sitting posture is improving and able to bring trunk upright but needs tactile cues. Her deficits that need improvement are bed mobility, transfers and gait. Would be a good candidate for CIR because she can minimally initiate and follow the command, but needs assistance to follow through.   Follow Up Recommendations  CIR;Supervision/Assistance - 24 hour     Equipment Recommendations  Other (comment)    Recommendations for Other Services Rehab consult     Precautions / Restrictions Precautions Precautions: Fall Restrictions Weight Bearing Restrictions: No    Mobility  Bed Mobility Overal bed mobility: Needs Assistance Bed Mobility: Rolling;Sidelying to Sit Rolling: +2 for physical assistance;Mod assist Sidelying to sit: Mod assist       General bed mobility comments: tactile and verbal cues to faciliate pt assisting with bed mobility. Pt. participated in pushing from [place] to sit but couldn't bring up trunk. Initiated movement to roll able to pull  minimally on bed rail. Able to weight shift and scoot hips to EOB minimally, min A  for all the way.  Transfers Overall transfer level: Needs assistance Equipment used: 2 person hand held assist Transfers: Squat Pivot Transfers   Stand pivot transfers: Mod assist;+2 physical assistance       General transfer comment: Assist for elevation to upright, Bilateral LE knee blocking and assist to initiate pivotal steps to chair. Increased time to perform.  Ambulation/Gait                  Stairs            Wheelchair Mobility    Modified Rankin (Stroke Patients Only) Modified Rankin (Stroke Patients Only) Pre-Morbid Rankin Score: No symptoms Modified Rankin: Moderately severe disability     Balance Overall balance assessment: Needs assistance Sitting-balance support: Feet supported;Bilateral upper extremity supported Sitting balance-Leahy Scale: Poor Sitting balance - Comments: tactile and verbal cues and support to maintain sitting balance Postural control: Posterior lean;Left lateral lean                          Cognition Arousal/Alertness: Lethargic Behavior During Therapy: Flat affect Overall Cognitive Status: Impaired/Different from baseline Area of Impairment: Following commands       Following Commands: Follows one step commands with increased time       General Comments: drowsy during session but alertness improved on EOB and chair.    Exercises      General Comments        Pertinent Vitals/Pain Pain Score: 4  Pain Location: throat and ant. neck Pain Descriptors / Indicators: Grimacing Pain Intervention(s): Monitored during session;Limited activity within patient's tolerance;Repositioned    Home Living     Available Help at Discharge: Available 24 hours/day;Family                Prior Function            PT Goals (current goals can now be found in the care plan section) Progress towards PT goals: Progressing toward goals    Frequency  Min 3X/week  PT Plan Current plan remains appropriate    Co-evaluation             End of Session Equipment Utilized During Treatment: Gait belt Activity Tolerance: Patient limited by lethargy Patient left: in chair;with call bell/phone within reach;with family/visitor present;with chair alarm set     Time: 2574-9355 PT Time Calculation (min) (ACUTE ONLY): 25 min  Charges:                       G Codes:      Jodi Geralds, [place] 09/10/2014, 1:24  PM

## 2014-09-11 ENCOUNTER — Inpatient Hospital Stay (HOSPITAL_COMMUNITY): Payer: Medicare Other | Admitting: Rehabilitation

## 2014-09-11 ENCOUNTER — Encounter (HOSPITAL_COMMUNITY): Payer: Self-pay | Admitting: *Deleted

## 2014-09-11 ENCOUNTER — Inpatient Hospital Stay (HOSPITAL_COMMUNITY): Payer: Medicare Other | Admitting: Occupational Therapy

## 2014-09-11 ENCOUNTER — Inpatient Hospital Stay (HOSPITAL_COMMUNITY): Payer: Medicare Other | Admitting: Speech Pathology

## 2014-09-11 DIAGNOSIS — G8194 Hemiplegia, unspecified affecting left nondominant side: Secondary | ICD-10-CM

## 2014-09-11 DIAGNOSIS — G8190 Hemiplegia, unspecified affecting unspecified side: Secondary | ICD-10-CM

## 2014-09-11 DIAGNOSIS — R414 Neurologic neglect syndrome: Secondary | ICD-10-CM | POA: Diagnosis present

## 2014-09-11 DIAGNOSIS — I639 Cerebral infarction, unspecified: Secondary | ICD-10-CM

## 2014-09-11 LAB — GLUCOSE, CAPILLARY
GLUCOSE-CAPILLARY: 109 mg/dL — AB (ref 70–99)
GLUCOSE-CAPILLARY: 46 mg/dL — AB (ref 70–99)
Glucose-Capillary: 109 mg/dL — ABNORMAL HIGH (ref 70–99)
Glucose-Capillary: 126 mg/dL — ABNORMAL HIGH (ref 70–99)
Glucose-Capillary: 154 mg/dL — ABNORMAL HIGH (ref 70–99)
Glucose-Capillary: 71 mg/dL (ref 70–99)

## 2014-09-11 LAB — COMPREHENSIVE METABOLIC PANEL
ALK PHOS: 59 U/L (ref 39–117)
ALT: 15 U/L (ref 0–35)
ANION GAP: 7 (ref 5–15)
AST: 28 U/L (ref 0–37)
Albumin: 2 g/dL — ABNORMAL LOW (ref 3.5–5.2)
BUN: 11 mg/dL (ref 6–23)
CALCIUM: 8.8 mg/dL (ref 8.4–10.5)
CO2: 22 mmol/L (ref 19–32)
Chloride: 109 mmol/L (ref 96–112)
Creatinine, Ser: 0.69 mg/dL (ref 0.50–1.10)
GFR calc Af Amer: >90 mL/min (ref >90)
GFR calc non Af Amer: 81 mL/min — ABNORMAL LOW (ref >90)
Glucose, Bld: 111 mg/dL — ABNORMAL HIGH (ref 70–99)
Potassium: 4.3 mmol/L (ref 3.5–5.1)
Sodium: 138 mmol/L (ref 135–145)
TOTAL PROTEIN: 4.6 g/dL — AB (ref 6.0–8.3)
Total Bilirubin: 0.8 mg/dL (ref 0.3–1.2)

## 2014-09-11 LAB — CBC WITH DIFFERENTIAL/PLATELET
BASOS PCT: 0 % (ref 0–1)
Basophils Absolute: 0 10*3/uL (ref 0.0–0.1)
EOS ABS: 0.2 10*3/uL (ref 0.0–0.7)
Eosinophils Relative: 2 % (ref 0–5)
HEMATOCRIT: 27.9 % — AB (ref 36.0–46.0)
Hemoglobin: 9.4 g/dL — ABNORMAL LOW (ref 12.0–15.0)
LYMPHS ABS: 1.6 10*3/uL (ref 0.7–4.0)
Lymphocytes Relative: 21 % (ref 12–46)
MCH: 29.4 pg (ref 26.0–34.0)
MCHC: 33.7 g/dL (ref 30.0–36.0)
MCV: 87.2 fL (ref 78.0–100.0)
Monocytes Absolute: 0.8 10*3/uL (ref 0.1–1.0)
Monocytes Relative: 11 % (ref 3–12)
Neutro Abs: 5 10*3/uL (ref 1.7–7.7)
Neutrophils Relative %: 66 % (ref 43–77)
Platelets: 211 10*3/uL (ref 150–400)
RBC: 3.2 MIL/uL — ABNORMAL LOW (ref 3.87–5.11)
RDW: 13.4 % (ref 11.5–15.5)
WBC: 7.6 10*3/uL (ref 4.0–10.5)

## 2014-09-11 MED ORDER — MUSCLE RUB 10-15 % EX CREA
TOPICAL_CREAM | Freq: Two times a day (BID) | CUTANEOUS | Status: DC | PRN
  Administered 2014-09-11 – 2014-09-12 (×4): via TOPICAL
  Filled 2014-09-11: qty 85

## 2014-09-11 MED ORDER — GLUCOSE 40 % PO GEL
ORAL | Status: AC
  Administered 2014-09-12: 37.5 g
  Filled 2014-09-11: qty 1

## 2014-09-11 MED ORDER — GLUCERNA SHAKE PO LIQD
237.0000 mL | Freq: Two times a day (BID) | ORAL | Status: DC
  Administered 2014-09-12 (×2): 237 mL via ORAL

## 2014-09-11 MED ORDER — TROLAMINE SALICYLATE 10 % EX CREA: TOPICAL_CREAM | Freq: Two times a day (BID) | CUTANEOUS | Status: DC | PRN

## 2014-09-11 NOTE — Interval H&P Note (Signed)
Christy Simmons was admitted today to Inpatient Rehabilitation with the diagnosis of Right subcortical infarcts.  The patient's history has been reviewed, patient examined, and there is no change in status.  Patient continues to be appropriate for intensive inpatient rehabilitation.  I have reviewed the patient's chart and labs.  Questions were answered to the patient's satisfaction.  [name] 09/11/2014, 10:26 AM

## 2014-09-11 NOTE — Care Management Note (Signed)
[facility] Individual Statement of Services  Patient Name:  Christy Simmons  Date:  09/11/2014  Welcome to the [facility].  Our goal is to provide you with an individualized program based on your diagnosis and situation, designed to meet your specific needs.  With this comprehensive rehabilitation program, you will be expected to participate in at least 3 hours of rehabilitation therapies Monday-Friday, with modified therapy programming on the weekends.  Your rehabilitation program will include the following services:  Physical Therapy (PT), Occupational Therapy (OT), Speech Therapy (ST), 24 hour per day rehabilitation nursing, Therapeutic Recreaction (TR), Neuropsychology, Case Management (Social Worker), Rehabilitation Medicine, Nutrition Services and Pharmacy Services  Weekly team conferences will be held on Wednesday to discuss your progress.  Your Social Worker will talk with you frequently to get your input and to update you on team discussions.  Team conferences with you and your family in attendance may also be held.  Expected length of stay: 19-21 days  Overall anticipated outcome: supervision with cueing  Depending on your progress and recovery, your program may change. Your Social Worker will coordinate services and will keep you informed of any changes. Your Social Worker's name and contact numbers are listed  below.  The following services may also be recommended but are not provided by the [facility] will be made to provide these services after discharge if needed.  Arrangements include referral to agencies that provide these services.  Your insurance has been verified to be:  [facility] Medicare Your primary doctor is:  Banker  Pertinent information will be shared with your doctor and your insurance  company.  Social Worker:  Ovidio Kin, [place] or ([phone]  Information discussed with and copy given to patient by: Elease Hashimoto, 09/11/2014, 1:03 PM

## 2014-09-11 NOTE — Progress Notes (Signed)
Social Work Assessment and Plan Social Work Assessment and Plan  Patient Details  Name: Christy Simmons MRN: [id number] Date of Birth: 13-Oct-1934  Today's Date: 09/11/2014  Problem List:  Patient Active Problem List   Diagnosis Date Noted  . Left-sided neglect 09/11/2014  . Left hemiparesis 09/11/2014  . Basal ganglia infarction 09/10/2014  . ICH (intracerebral hemorrhage)   . Malnutrition of moderate degree 09/07/2014  . Cytotoxic brain edema   . Stroke   . Acute respiratory failure with hypoxia 09/05/2014  . Stroke, acute, embolic   . Lumbar stenosis with neurogenic claudication 09/11/2013  . Amnesia 01/10/2013  . Patent foramen ovale with atrial septal aneurysm 10/26/2011   Past Medical History:  Past Medical History  Diagnosis Date  . Transient ischemic attack   . HTN (hypertension)   . Hyperlipidemia   . DM (diabetes mellitus)   . GERD (gastroesophageal reflux disease)   . Heart disease     PFO  . Amnesia 01/10/2013  . Patent foramen ovale with atrial septal aneurysm   . Complication of anesthesia   . PONV (postoperative nausea and vomiting)   . Family history of anesthesia complication     "My brother has trouble waking up."  . Glaucoma     Hx: of  . Early cataracts, bilateral     Hx: of  . Arthritis   . Stroke    Past Surgical History:  Past Surgical History  Procedure Laterality Date  . US echocardiography  10-13-11    EF 60-65% normal  . Back surgery      disc   . Tonsillectomy    . Tee without cardioversion  10/30/2011    Procedure: TRANSESOPHAGEAL ECHOCARDIOGRAM (TEE);  Surgeon: Larey Dresser, MD;  Location: [facility];  Service: Cardiovascular;  Laterality: N/A;  . Colonoscopy w/ biopsies and polypectomy      HX;of  . Tonsillectomy    . Tubal ligation    . Lumbar laminectomy/decompression microdiscectomy N/A 09/11/2013    Procedure: LUMBAR LAMINECTOMY/DECOMPRESSION MICRODISCECTOMY 2 LEVELS;  Surgeon: Ophelia Charter, MD;  Location: [facility];   Service: Neurosurgery;  Laterality: N/A;  Lumbar Four-Five Laminectomy and Foraminotomy with redo Lumbar Five-Sacral One diskectomy  . Radiology with anesthesia N/A 09/05/2014    Procedure: RADIOLOGY WITH ANESTHESIA;  Surgeon: Rob Hickman, MD;  Location: [facility];  Service: Radiology;  Laterality: N/A;   Social History:  reports that she has never smoked. She has never used smokeless tobacco. She reports that she does not drink alcohol or use illicit drugs.  Family / Support Systems Marital Status: Married Patient Roles: Spouse, Parent Spouse/Significant Other: Iona Beard  [phone]-home  [phone]-cell Children: Barnetta Chapel Cox-daughter  [phone]-home  [phone]-cell Other Supports: [name] Combs-daughter stays here at night with pt Anticipated Caregiver: Five daughter's husband can not provide much physical care Ability/Limitations of Caregiver: husband limited with the amount of care he can provide, Laura-daughter has had several back surgeries, so limited wiht physical care she can provide also. Caregiver Availability: 24/7 Family Dynamics: Very close family all five daughter's are involved and supportive.  Husband states: " Very involved, take over at times."  Pt allows others to speak for her and sits there with eyes closed or smiles.  [name] here daily and mostly at night so speaks for Mom.  Social History Preferred language: English Religion: Christian Reformed Cultural Background: No issues Education: [employer] Read: Yes Write: Yes Employment Status: Retired Freight forwarder Issues: No issues Guardian/Conservator: No formal POA, according  to MD pt is capable of making her own decisions while here.  Daughter's or husband will be here also much of the time.   Abuse/Neglect Physical Abuse: Denies Verbal Abuse: Denies Sexual Abuse: Denies Exploitation of patient/patient's resources: Denies Self-Neglect: Denies  Emotional Status Pt's affect, behavior adn adjustment status:  Pt is motivated to improve and will try her best and do what the therapists ask of her.  Her daughter reports she is very independent even with the pain she has in her neck and legs.  She has pain from falling when she had the stroke. She has always been one to be very independent and cared for others. Recent Psychosocial Issues: other health issues-had leg pain from back surgery last year but took meds for and helped Pyschiatric History: No history deferred depression screen at this time due to tired and in pain from therapies.  Will re-assess and intervene if necessary, will aks team also for their input.  Maybe pt converses more in therapy with them , when family member stays in room. Substance Abuse History: No issues  Patient / Family Perceptions, Expectations & Goals Pt/Family understanding of illness & functional limitations: Pt and husband can explain her stroke and deficits.  She is having more issues wiht neck pain from fall when had stroke.  Husband has spoken with MD's and feels his questions are being addressed and felt heard. Premorbid pt/family roles/activities: Wife, Mother, grandmother, Sister, Church member, Health visitor, etc Anticipated changes in roles/activities/participation: resume Pt/family expectations/goals: Pt states: " I want to do for myself."  Husband states: " We are hopeful she will do well here, but I know it will take time."    [employer]: None Premorbid Home Care/DME Agencies: Other (Comment) (Had [place] in the past) Transportation available at discharge: Family Resource referrals recommended: Neuropsychology, Support group (specify)  Discharge Planning Living Arrangements: Spouse/significant other Support Systems: Spouse/significant other, Children, Other relatives, Friends/neighbors, Church/faith community Type of Residence: Private residence Insurance Resources: Multimedia programmer (specify) Primary school teacher) Financial Resources: Charity fundraiser Screen Referred: No Living Expenses: Lives with family Money Management: Spouse Does the patient have any problems obtaining your medications?: No Home Management: All assist she was not able to do certain things due to back issues-daughter's assist also Patient/Family Preliminary Plans: Return home with husband and daughter's to assist, will await team's evaluations and then disucss goals and safe plan for discharge.  Husband and one daughter-Laura are limited with the physical care they can provide due to own health issues.  Will provide support and work on a safe discharge plan. Social Work Anticipated Follow Up Needs: HH/OP, Support Group  Clinical Impression Pleasant quiet female who is content in allowing her family to speak for her.  Her husband and daughter are very involved and supportive and plan to be here a lot during her stay.  Laura-daughter  Stays at night with her.  Will work on discharge plan once goals set and length of stay decided.    Elease Hashimoto 09/11/2014, 1:32 PM

## 2014-09-11 NOTE — Evaluation (Signed)
Occupational Therapy Assessment and Plan  Patient Details  Name: Christy Simmons MRN: [id number] Date of Birth: Oct 04, 1934  OT Diagnosis: abnormal posture, ataxia, cognitive deficits, hemiplegia affecting non-dominant side and muscle weakness (generalized) Rehab Potential: Rehab Potential (ACUTE ONLY): Good ELOS: 19-21 days   Today's Date: 09/11/2014 OT Individual Time: 1000-1100 OT Individual Time Calculation (min): 60 min     Problem List:  Patient Active Problem List   Diagnosis Date Noted  . Left-sided neglect 09/11/2014  . Left hemiparesis 09/11/2014  . Basal ganglia infarction 09/10/2014  . ICH (intracerebral hemorrhage)   . Malnutrition of moderate degree 09/07/2014  . Cytotoxic brain edema   . Stroke   . Acute respiratory failure with hypoxia 09/05/2014  . Stroke, acute, embolic   . Lumbar stenosis with neurogenic claudication 09/11/2013  . Amnesia 01/10/2013  . Patent foramen ovale with atrial septal aneurysm 10/26/2011    Past Medical History:  Past Medical History  Diagnosis Date  . Transient ischemic attack   . HTN (hypertension)   . Hyperlipidemia   . DM (diabetes mellitus)   . GERD (gastroesophageal reflux disease)   . Heart disease     PFO  . Amnesia 01/10/2013  . Patent foramen ovale with atrial septal aneurysm   . Complication of anesthesia   . PONV (postoperative nausea and vomiting)   . Family history of anesthesia complication     "My brother has trouble waking up."  . Glaucoma     Hx: of  . Early cataracts, bilateral     Hx: of  . Arthritis   . Stroke    Past Surgical History:  Past Surgical History  Procedure Laterality Date  . US echocardiography  10-13-11    EF 60-65% normal  . Back surgery      disc   . Tonsillectomy    . Tee without cardioversion  10/30/2011    Procedure: TRANSESOPHAGEAL ECHOCARDIOGRAM (TEE);  Surgeon: Larey Dresser, MD;  Location: [place];  Service: Cardiovascular;  Laterality: N/A;  . Colonoscopy w/ biopsies and  polypectomy      HX;of  . Tonsillectomy    . Tubal ligation    . Lumbar laminectomy/decompression microdiscectomy N/A 09/11/2013    Procedure: LUMBAR LAMINECTOMY/DECOMPRESSION MICRODISCECTOMY 2 LEVELS;  Surgeon: Ophelia Charter, MD;  Location: [facility];  Service: Neurosurgery;  Laterality: N/A;  Lumbar Four-Five Laminectomy and Foraminotomy with redo Lumbar Five-Sacral One diskectomy  . Radiology with anesthesia N/A 09/05/2014    Procedure: RADIOLOGY WITH ANESTHESIA;  Surgeon: Rob Hickman, MD;  Location: [place];  Service: Radiology;  Laterality: N/A;    Assessment & Plan Clinical Impression: Patient is a 80 y.o. year old female with history of hypertension, diabetes mellitus and peripheral neuropathy, PFO with atrial septal aneurysm maintained on aspirin 81 mg daily. Independent prior to admission living with her husband. Presented 09/05/2014 with left-sided weakness and altered mental status. CT of the head show well-defined area of hypodensity around the right basal ganglia. Patient did not receive TPA. CT angiogram head and neck showed distal right M1 occlusion, abnormal perfusion throughout essentially the entirety of the right MCA territory. Underwent complete revascularization of occluded right MCA per interventional radiology. MRI of the brain showed patchy right MCA branch infarct with small hemorrhagic transformation in the right basal ganglia but no significant midline shift. Echocardiogram with ejection fraction of 53% grade 1 diastolic dysfunction. Neurology services consulted maintained on aspirin for CVA prophylaxis. Patient did require intubation followed by critical care  and extubated without difficulty 09/07/2014. A venous Doppler study lower extremities completed 09/07/2014 showed findings consistent with nonocclusive DVT right popliteal vein as well as DVT left posterior tibial vein. Interventional radiology consulted for plan of IVC filter and no other anticoagulation at this  time due to hemorrhagic infarct and completed 09/09/2014. Modified barium swallow 09/08/2014 recommendations of dysphagia 1 honey thick liquid diet.  Patient transferred to CIR on 09/10/2014 .    Patient currently requires max with basic self-care skills secondary to muscle weakness, decreased cardiorespiratoy endurance and decreased oxygen support and decreased sitting balance, decreased standing balance, decreased postural control, decreased balance strategies and difficulty maintaining precautions.  Prior to hospitalization, patient could complete ADLs with independent .  Patient will benefit from skilled intervention to increase independence with basic self-care skills prior to discharge home with care partner.  Anticipate patient will require 24 hour supervision and follow up home health.  OT - End of Session Activity Tolerance: Decreased this session;Tolerates < 10 min activity, no significant change in vital signs Endurance Deficit: Yes Endurance Deficit Description: Pt with significant endurance deficits during session and was extremely fatigued.  OT Assessment Rehab Potential (ACUTE ONLY): Good Barriers to Discharge:  (none noted at this time) OT Patient demonstrates impairments in the following area(s): Balance;Edema;Endurance;Cognition;Motor;Pain;Safety;Perception OT Basic ADL's Functional Problem(s): Grooming;Bathing;Dressing;Toileting OT Transfers Functional Problem(s): Toilet;Tub/Shower OT Additional Impairment(s): None OT Plan OT Intensity: Minimum of 1-2 x/day, 45 to 90 minutes OT Frequency: 5 out of 7 days OT Duration/Estimated Length of Stay: 19-21 days OT Treatment/Interventions: Balance/vestibular training;Discharge planning;Pain management;Self Care/advanced ADL retraining;Therapeutic Activities;UE/LE Coordination activities;Cognitive remediation/compensation;Functional mobility training;Patient/family education;Therapeutic Exercise;Community reintegration;DME/adaptive  equipment instruction;Psychosocial support;UE/LE Strength taining/ROM OT Self Feeding Anticipated Outcome(s): n/a OT Basic Self-Care Anticipated Outcome(s): supervision OT Toileting Anticipated Outcome(s): supervision OT Bathroom Transfers Anticipated Outcome(s): supervision OT Recommendation Recommendations for Other Services: Other (comment) (n/a) Patient destination: Home Follow Up Recommendations: 24 hour supervision/assistance;Home health OT Equipment Recommended: Tub/shower seat;3 in 1 bedside comode   Skilled Therapeutic Intervention Upon entering the room, pt supine in bed with head slightly elevated. Pt transitioning from SLP evaluation with daughter present in room. Pt reporting 9/10 pain in neck and heat applied to the area during the session. Pt reports that movement during session did not increase or decreased pain. OT educated pt and daughter on OT purpose, POC, and goals with them both verbalizing understanding. Pt participating in bathing seated on EOB and no clothing available on evaluation day. Pt with extreme fatigue deficits and able to sit EOB for total of 10 minutes with bathing completed in both sitting and supine positions. Pt did stand with Mod A and began crying wishing to be put back into bed. Pt returned to supine with total assist to rest during education regarding rehab expectations and OT goals.   OT Evaluation Precautions/Restrictions  Precautions Precautions: Fall Precaution Comments: L hemiparesis, ataxia Restrictions Weight Bearing Restrictions: No General PT Missed Treatment Reason: Patient fatigue Pain Pain Assessment Pain Assessment: 0-10 Faces Pain Scale: Hurts even more Pain Type: Acute pain Pain Location: Leg Pain Orientation: Right;Left Pain Descriptors / Indicators: Aching Pain Intervention(s): Medication (See eMAR);Emotional support Home Living/Prior Functioning Home Living Family/patient expects to be discharged to:: Private  residence Living Arrangements: Spouse/significant other Available Help at Discharge: Family, Available 24 hours/day Type of Home: House Home Access: Stairs to enter [employer] of Steps: 3 with no rail and 7 with R rail  Entrance Stairs-Rails: None Home Layout: One level Additional Comments: tub/shower combo with curtain in  most used bathroom and walk in shower in other bathroom  Lives With: Spouse, Daughter IADL History Homemaking Responsibilities: No Education: some college Occupation: Retired IADL Comments: she reports doing her own medications and finances. Husband cooks and cleans. Prior Function Level of Independence: Independent with basic ADLs, Independent with gait, Independent with transfers, Independent with homemaking with ambulation  Able to Take Stairs?: Yes Driving: No Vocation: Retired Leisure: Hobbies-yes (Comment) Comments: likes gardening Vision/Perception  Vision- History Baseline Vision/History: Wears glasses Wears Glasses: At all times (per pt report) Patient Visual Report: No change from baseline Vision- Assessment Vision Assessment?: No apparent visual deficits  Cognition Overall Cognitive Status: Impaired/Different from baseline Arousal/Alertness: Lethargic Orientation Level: Oriented to person;Oriented to place;Oriented to situation;Oriented to time Attention: Selective Selective Attention: Appears intact Memory: Impaired Memory Impairment: Decreased recall of new information Awareness: Appears intact Problem Solving: Impaired Problem Solving Impairment: Functional basic Safety/Judgment: Appears intact Comments: Pt very weak and deconditioned as well as very fatigued throughout session, but did not demonstrate overtly unsafe behavior.  Sensation Sensation Light Touch: Appears Intact Stereognosis: Not tested Hot/Cold: Appears Intact Proprioception: Impaired by gross assessment Additional Comments: did not formally test  proprioception, however functionally seems to be impaired.  Coordination Gross Motor Movements are Fluid and Coordinated: No Fine Motor Movements are Fluid and Coordinated: No Coordination and Movement Description: Pt with decreased coordination and functional strength in LUE/LE Heel Shin Test: decreased fluidity Motor  Motor Motor: Hemiplegia;Abnormal postural alignment and control Motor - Skilled Clinical Observations: Pt with LUE/LE hemiplegia, decreased balance, and extreme deconditioning.  Mobility  Bed Mobility Bed Mobility: Supine to Sit;Sit to Supine;Rolling Right Rolling Right: 4: Min assist Rolling Right Details: Tactile cues for initiation;Verbal cues for sequencing;Verbal cues for technique;Verbal cues for precautions/safety;Manual facilitation for weight shifting Supine to Sit: 4: Min assist Supine to Sit Details: Verbal cues for sequencing;Verbal cues for technique;Verbal cues for precautions/safety;Manual facilitation for weight shifting Sit to Supine: 3: Mod assist Sit to Supine - Details: Verbal cues for sequencing;Verbal cues for technique;Manual facilitation for placement;Verbal cues for precautions/safety;Manual facilitation for weight shifting Transfers Sit to Stand: 2: Max assist Sit to Stand Details: Verbal cues for sequencing;Verbal cues for technique;Verbal cues for precautions/safety;Manual facilitation for weight shifting;Manual facilitation for weight bearing Stand to Sit: 2: Max assist Stand to Sit Details (indicate cue type and reason): Verbal cues for sequencing;Verbal cues for technique;Verbal cues for precautions/safety;Manual facilitation for weight shifting;Manual facilitation for weight bearing  Trunk/Postural Assessment  Cervical Assessment Cervical Assessment: Exceptions to [facility] Cervical Strength Overall Cervical Strength Comments: forward head Thoracic Assessment Thoracic Assessment: Exceptions to [facility] Thoracic Strength Overall Thoracic Strength  Comments: kyphotic and rounded shoulders Lumbar Assessment Lumbar Assessment: Exceptions to [facility] Lumbar Strength Overall Lumbar Strength Comments: posterior pelvic tilt Postural Control Postural Control: Deficits on evaluation Righting Reactions: absent Protective Responses: absent Postural Limitations: pt with extremely forward flexed posture, despite strong facilitation at hips for hip extension.   Balance Balance Balance Assessed: Yes Static Sitting Balance Static Sitting - Balance Support: Feet supported;Bilateral upper extremity supported Static Sitting - Level of Assistance: 5: Stand by assistance;4: Min assist Dynamic Sitting Balance Dynamic Sitting - Balance Support: Feet supported;During functional activity Dynamic Sitting - Level of Assistance: 4: Min assist;3: Mod assist Static Standing Balance Static Standing - Balance Support: During functional activity;Right upper extremity supported;Left upper extremity supported Static Standing - Level of Assistance: 2: Max assist Dynamic Standing Balance Dynamic Standing - Balance Support: During functional activity;Left upper extremity supported;Right upper extremity supported Dynamic  Standing - Level of Assistance: 2: Max assist Extremity/Trunk Assessment RUE Assessment RUE Assessment: Exceptions to [facility] (AROM limited for shoulder elevation secondary to pain. ~ 60 degrees. AROM for elbow,wrist, and digits WFLs.  Strength throughout 2-/5) LUE Assessment LUE Assessment: Exceptions to [facility] (strength grossly 2-/5. AROM is WFLs. Pt reports shoulder arthritis.)  FIM:  FIM - Grooming Grooming Steps: Wash, rinse, dry face Grooming: 2: Patient completes 1 of 4 or 2 of 5 steps FIM - Bathing Bathing Steps Patient Completed: Chest;Right Arm;Left Arm;Abdomen;Right upper leg;Left upper leg Bathing: 3: Mod-Patient completes 5-7 [id number] parts or 50-74% FIM - Upper Body Dressing/Undressing Upper body dressing/undressing: 0: Wears gown/pajamas-no  public clothing FIM - Lower Body Dressing/Undressing Lower body dressing/undressing: 0: Wears gInterior and spatial designerFIM - BControl and instrumentation engineerDevices: Arm rests Bed/Chair Transfer: 4: Supine > Sit: Min A (steadying Pt. > 75%/lift 1 leg);3: Sit > Supine: Mod A (lifting assist/Pt. 50-74%/lift 2 legs);2: Bed > Chair or W/C: Max A (lift and lower assist);2: Chair or W/C > Bed: Max A (lift and lower assist) FIM - Tub/Shower Transfers Tub/shower Transfers: 0-Activity did not occur or was simulated   Refer to Care Plan for Long Term Goals  Recommendations for other services: None  Discharge Criteria: Patient will be discharged from OT if patient refuses treatment 3 consecutive times without medical reason, if treatment goals not met, if there is a change in medical status, if patient makes no progress towards goals or if patient is discharged from hospital.  The above assessment, treatment plan, treatment alternatives and goals were discussed and mutually agreed upon: by patient and by family  PPhineas Semen2/07/2015, 5:17 PM

## 2014-09-11 NOTE — Evaluation (Signed)
Physical Therapy Assessment and Plan  Patient Details  Name: Christy Simmons MRN: [id number] Date of Birth: [birth year]  PT Diagnosis: Abnormal posture, Abnormality of gait, Ataxia, Cognitive deficits, Coordination disorder, Difficulty walking, Hemiparesis non-dominant and Muscle weakness Rehab Potential: Good ELOS: 19-21 days    Today's Date: 09/11/2014 PT Individual Time: 1100-1150 PT Individual Time Calculation (min): 50 min    Problem List:  Patient Active Problem List   Diagnosis Date Noted  . Left-sided neglect 09/11/2014  . Left hemiparesis 09/11/2014  . Basal ganglia infarction 09/10/2014  . ICH (intracerebral hemorrhage)   . Malnutrition of moderate degree 09/07/2014  . Cytotoxic brain edema   . Stroke   . Acute respiratory failure with hypoxia 09/05/2014  . Stroke, acute, embolic   . Lumbar stenosis with neurogenic claudication 09/11/2013  . Amnesia 01/10/2013  . Patent foramen ovale with atrial septal aneurysm 10/26/2011    Past Medical History:  Past Medical History  Diagnosis Date  . Transient ischemic attack   . HTN (hypertension)   . Hyperlipidemia   . DM (diabetes mellitus)   . GERD (gastroesophageal reflux disease)   . Heart disease     PFO  . Amnesia 01/10/2013  . Patent foramen ovale with atrial septal aneurysm   . Complication of anesthesia   . PONV (postoperative nausea and vomiting)   . Family history of anesthesia complication     "My brother has trouble waking up."  . Glaucoma     Hx: of  . Early cataracts, bilateral     Hx: of  . Arthritis   . Stroke    Past Surgical History:  Past Surgical History  Procedure Laterality Date  . US echocardiography  10-13-11    EF 60-65% normal  . Back surgery      disc   . Tonsillectomy    . Tee without cardioversion  10/30/2011    Procedure: TRANSESOPHAGEAL ECHOCARDIOGRAM (TEE);  Surgeon: Larey Dresser, MD;  Location: [place];  Service: Cardiovascular;  Laterality: N/A;  . Colonoscopy w/ biopsies  and polypectomy      HX;of  . Tonsillectomy    . Tubal ligation    . Lumbar laminectomy/decompression microdiscectomy N/A 09/11/2013    Procedure: LUMBAR LAMINECTOMY/DECOMPRESSION MICRODISCECTOMY 2 LEVELS;  Surgeon: Ophelia Charter, MD;  Location: [place] [facility];  Service: Neurosurgery;  Laterality: N/A;  Lumbar Four-Five Laminectomy and Foraminotomy with redo Lumbar Five-Sacral One diskectomy  . Radiology with anesthesia N/A 09/05/2014    Procedure: RADIOLOGY WITH ANESTHESIA;  Surgeon: Rob Hickman, MD;  Location: [place];  Service: Radiology;  Laterality: N/A;    Assessment & Plan Clinical Impression: Patient is a 80 y.o. year old female with with history of hypertension, diabetes mellitus and peripheral neuropathy, PFO with atrial septal aneurysm maintained on aspirin 81 mg daily. Independent prior to admission living with her husband. Presented 09/05/2014 with left-sided weakness and altered mental status. CT of the head show well-defined area of hypodensity around the right basal ganglia. Patient did not receive TPA. CT angiogram head and neck showed distal right M1 occlusion, abnormal perfusion throughout essentially the entirety of the right MCA territory. Underwent complete revascularization of occluded right MCA per interventional radiology. MRI of the brain showed patchy right MCA branch infarct with small hemorrhagic transformation in the right basal ganglia but no significant midline shift. Echocardiogram with ejection fraction of 09% grade 1 diastolic dysfunction. Neurology services consulted maintained on aspirin for CVA prophylaxis. Patient did require intubation followed by critical  care and extubated without difficulty 09/07/2014. A venous Doppler study lower extremities completed 09/07/2014 showed findings consistent with nonocclusive DVT right popliteal vein as well as DVT left posterior tibial vein. Interventional radiology consulted for plan of IVC filter and no other  anticoagulation at this time due to hemorrhagic infarct and completed 09/09/2014. Modified barium swallow 09/08/2014 recommendations of dysphagia 1 honey thick liquid diet.  Patient transferred to CIR on 09/10/2014 .   Patient currently requires max to +2 A for mobility with mobility secondary to muscle weakness, decreased cardiorespiratoy endurance, ataxia, decreased coordination and decreased motor planning and decreased attention, decreased awareness and decreased memory.  Prior to hospitalization, patient was independent  with mobility and lived with Spouse, Daughter in a House home.  Home access is 3 with no rail and 7 with R rail Stairs to enter.  Patient will benefit from skilled PT intervention to maximize safe functional mobility, minimize fall risk and decrease caregiver burden for planned discharge home with 24 hour supervision.  Anticipate patient will benefit from follow up [place] at discharge.  PT - End of Session Activity Tolerance: Tolerates 30+ min activity with multiple rests Endurance Deficit: Yes Endurance Deficit Description: Pt with significant endurance deficits during session and was extremely fatigued.  PT Assessment Rehab Potential (ACUTE/IP ONLY): Good PT Patient demonstrates impairments in the following area(s): Balance;Endurance;Motor;Pain;Safety PT Transfers Functional Problem(s): Bed Mobility;Bed to Chair;Car;Furniture PT Locomotion Functional Problem(s): Ambulation;Wheelchair Mobility;Stairs PT Plan PT Intensity: Minimum of 1-2 x/day ,45 to 90 minutes PT Frequency: 5 out of 7 days PT Duration Estimated Length of Stay: 19-21 days  PT Treatment/Interventions: Ambulation/gait training;Balance/vestibular training;Cognitive remediation/compensation;Discharge planning;Disease management/prevention;DME/adaptive equipment instruction;Functional electrical stimulation;Functional mobility training;Neuromuscular re-education;Pain management;Patient/family education;Skin care/wound  management;Splinting/orthotics;Stair training;Therapeutic Activities;Therapeutic Exercise;UE/LE Strength taining/ROM;UE/LE Coordination activities;Wheelchair propulsion/positioning PT Transfers Anticipated Outcome(s): Supervision PT Locomotion Anticipated Outcome(s): Supervision at ambulatory level.  PT Recommendation Follow Up Recommendations: Home health PT;24 hour supervision/assistance Patient destination: Home Equipment Recommended: To be determined  Skilled Therapeutic Intervention PT assessment and evaluation completed, see full details below.  Initiated gait and stairs with pt, however requires +2 A due to extreme fatigue and deconditioning.  Provided max education to family regarding current levels, rehab schedule, expected outcomes, and estimated LOS.  Pt and family verbalized understanding.  Also educated on need to remain in w/c since lunch would be coming soon and to remain in chair 30 mins following meal to allow for safe digestion.  Pt and family verbalized understanding.  Pt left in w/c with quick release belt donned and all needs in reach.   PT Evaluation Precautions/Restrictions Precautions Precautions: Fall Precaution Comments: L hemiparesis, ataxia Restrictions Weight Bearing Restrictions: No General Chart Reviewed: Yes PT Amount of Missed Time (min): 10 Minutes PT Missed Treatment Reason: Patient fatigue Family/Caregiver Present: Yes (husband, daughter) Vital Signs Pain Pain Assessment Pain Assessment: Faces Faces Pain Scale: Hurts even more Pain Type: Acute pain Pain Location: Neck Pain Orientation: Right Pain Descriptors / Indicators: Aching Pain Intervention(s): Repositioned;RN made aware Home Living/Prior [place] expects to be discharged to:: Private residence Living Arrangements: Spouse/significant other Available Help at Discharge: Family;Available 24 hours/day Type of Home: House Home Access: Stairs to enter [employer] of Steps: 3 with no rail and 7 with R rail  Home Layout: One level Additional Comments: walk in shower and tub shower  Lives With: Spouse;Daughter Prior Function Level of Independence: Independent with basic ADLs;Independent with gait;Independent with transfers;Independent with homemaking with ambulation (did cooking and laundry)  Able to Take  Stairs?: Yes Driving: No Vocation: Retired Leisure: Hobbies-yes (Comment) Comments: likes gardening Vision/Perception   See OT note Cognition Overall Cognitive Status: Impaired/Different from baseline Arousal/Alertness: Lethargic Orientation Level: Oriented to person;Oriented to place;Oriented to situation;Oriented to time Attention: Selective Selective Attention: Appears intact Memory: Impaired Memory Impairment: Decreased recall of new information Awareness: Appears intact Problem Solving: Impaired Problem Solving Impairment: Functional basic Safety/Judgment: Appears intact Comments: Pt very weak and deconditioned as well as very fatigued throughout session, but did not demonstrate overtly unsafe behavior.  Sensation Sensation Light Touch: Appears Intact Stereognosis: Not tested Hot/Cold: Not tested Proprioception: Impaired by gross assessment Additional Comments: did not formally test proprioception, however functionally seems to be impaired.  Coordination Gross Motor Movements are Fluid and Coordinated: No Fine Motor Movements are Fluid and Coordinated: No Coordination and Movement Description: Pt with decreased coordination and functional strength in LUE/LE Heel Shin Test: decreased fluidity Motor  Motor Motor: Hemiplegia;Abnormal postural alignment and control Motor - Skilled Clinical Observations: Pt with LUE/LE hemiplegia, decreased balance, and extreme deconditioning.   Mobility Bed Mobility Bed Mobility: Supine to Sit;Sit to Supine;Rolling Right Rolling Right: 4: Min assist Rolling Right Details: Tactile  cues for initiation;Verbal cues for sequencing;Verbal cues for technique;Verbal cues for precautions/safety;Manual facilitation for weight shifting Supine to Sit: 4: Min assist Supine to Sit Details: Verbal cues for sequencing;Verbal cues for technique;Verbal cues for precautions/safety;Manual facilitation for weight shifting Sit to Supine: 3: Mod assist Sit to Supine - Details: Verbal cues for sequencing;Verbal cues for technique;Manual facilitation for placement;Verbal cues for precautions/safety;Manual facilitation for weight shifting Transfers Transfers: Yes Sit to Stand: 2: Max assist Sit to Stand Details: Verbal cues for sequencing;Verbal cues for technique;Verbal cues for precautions/safety;Manual facilitation for weight shifting;Manual facilitation for weight bearing Stand to Sit: 2: Max assist Stand to Sit Details (indicate cue type and reason): Verbal cues for sequencing;Verbal cues for technique;Verbal cues for precautions/safety;Manual facilitation for weight shifting;Manual facilitation for weight bearing Stand Pivot Transfers: 2: Max assist Stand Pivot Transfer Details: Verbal cues for sequencing;Verbal cues for technique;Manual facilitation for weight shifting;Verbal cues for precautions/safety;Manual facilitation for weight bearing;Manual facilitation for placement Locomotion  Ambulation Ambulation: Yes Ambulation/Gait Assistance: 1: +2 Total assist;2: Max assist Ambulation Distance (Feet): 25 Feet Assistive device: 2 person hand held assist;Other (Comment) (pts LUE around therapist, R HHA) Ambulation/Gait Assistance Details: Verbal cues for sequencing;Verbal cues for technique;Verbal cues for precautions/safety;Manual facilitation for weight shifting;Verbal cues for gait pattern;Verbal cues for safe use of DME/AE;Manual facilitation for placement;Manual facilitation for weight bearing Ambulation/Gait Assistance Details: Pt required intermittent assist for LLE placemement for  weakness and decreased coordination with max cues for upright posture throughout.  Gait Gait: Yes Gait Pattern: Impaired Gait Pattern: Step-to pattern;Step-through pattern;Decreased step length - left;Decreased stance time - left;Decreased stride length;Poor foot clearance - left;Narrow base of support;Trunk flexed;Left flexed knee in stance Stairs / Additional Locomotion Stairs: Yes Stairs Assistance: 1: +2 Total assist Stairs Assistance Details: Verbal cues for sequencing;Verbal cues for technique;Verbal cues for precautions/safety;Manual facilitation for weight shifting;Verbal cues for gait pattern;Manual facilitation for placement;Manual facilitation for weight bearing Stair Management Technique: One rail Right;Step to pattern;Forwards Number of Stairs: 3 Height of Stairs: 4 Wheelchair Mobility Wheelchair Mobility: Yes Wheelchair Assistance: 3: Building surveyor Details: Tactile cues for initiation;Tactile cues for sequencing;Verbal cues for sequencing;Verbal cues for technique;Verbal cues for precautions/safety Wheelchair Propulsion: Both lower extermities Wheelchair Parts Management: Needs assistance Distance: 40  Trunk/Postural Assessment  Cervical Assessment Cervical Assessment: Exceptions to [facility] Cervical Strength Overall Cervical Strength Comments:  Pt with extreme forward head posture and tendency to keep head  Thoracic Assessment Thoracic Assessment: Exceptions to [facility] Thoracic Strength Overall Thoracic Strength Comments: forward/rounded shoulders, kyphotic posture.  Lumbar Assessment Lumbar Assessment: Exceptions to [facility] Lumbar Strength Overall Lumbar Strength Comments: Posterior pelvic tilt in sitting and standing.  Postural Control Postural Control: Deficits on evaluation Righting Reactions: absent Protective Responses: absent Postural Limitations: pt with extremely forward flexed posture, despite strong facilitation at hips for hip extension.    Balance Balance Balance Assessed: Yes Static Sitting Balance Static Sitting - Balance Support: Feet supported;Bilateral upper extremity supported Static Sitting - Level of Assistance: 5: Stand by assistance;4: Min assist Dynamic Sitting Balance Dynamic Sitting - Balance Support: Feet supported;During functional activity Dynamic Sitting - Level of Assistance: 4: Min assist;3: Mod assist Static Standing Balance Static Standing - Balance Support: During functional activity;Right upper extremity supported;Left upper extremity supported Static Standing - Level of Assistance: 2: Max assist Dynamic Standing Balance Dynamic Standing - Balance Support: During functional activity;Bilateral upper extremity supported Dynamic Standing - Level of Assistance: 2: Max assist;1: +2 Total assist (for gait) Extremity Assessment      RLE Assessment RLE Assessment: Exceptions to [facility] RLE Strength RLE Overall Strength Comments: Grossly more intact than LLE, however hip flex 3-/5, knee ext 3+/5, knee flex 3+/5, ankle DF/PF 3+/5 LLE Assessment LLE Assessment: Exceptions to [facility] LLE Strength LLE Overall Strength Comments: hip flex 2/5, knee ext 2/5, knee flex 2/5, ankle DF/PF 2-/5  FIM:  FIM - Bed/Chair Transfer Bed/Chair Transfer Assistive Devices: Arm rests Bed/Chair Transfer: 4: Supine > Sit: Min A (steadying Pt. > 75%/lift 1 leg);3: Sit > Supine: Mod A (lifting assist/Pt. 50-74%/lift 2 legs);2: Bed > Chair or W/C: Max A (lift and lower assist);2: Chair or W/C > Bed: Max A (lift and lower assist) FIM - Locomotion: Wheelchair Distance: 40 Locomotion: Wheelchair: 1: Travels less than 50 ft with moderate assistance (Pt: 50 - 74%) FIM - Locomotion: Ambulation Locomotion: Ambulation Assistive Devices: Other (comment) (pts LUE around therapist and HHA on R) Ambulation/Gait Assistance: 1: +2 Total assist;2: Max assist Locomotion: Ambulation: 1: Two helpers FIM - Locomotion: Stairs Locomotion: Pharmacologist: Insurance account manager - 1 Locomotion: Stairs: 1: Two helpers   Refer to [employer] for [facility]  Recommendations for other services: None  Discharge Criteria: Patient will be discharged from PT if patient refuses treatment 3 consecutive times without medical reason, if treatment goals not met, if there is a change in medical status, if patient makes no progress towards goals or if patient is discharged from hospital.  The above assessment, treatment plan, treatment alternatives and goals were discussed and mutually agreed upon: by patient and by family  Denice Bors 09/11/2014, 1:54 PM

## 2014-09-11 NOTE — Progress Notes (Signed)
Patient information reviewed and entered into eRehab system by Aliannah Holstrom, RN, CRRN, PPS Coordinator.  Information including medical coding and functional independence measure will be reviewed and updated through discharge.     Per nursing patient was given "Data Collection Information Summary for Patients in Inpatient Rehabilitation Facilities with attached "Privacy Act Statement-Health Care Records" upon admission.  

## 2014-09-11 NOTE — Progress Notes (Signed)
INITIAL NUTRITION ASSESSMENT  DOCUMENTATION CODES Per approved criteria  -Not Applicable   INTERVENTION: Continue Glucerna Shakes (thickened to appropriate consistency), each supplement provides 220 kcal and 10 grams of protein Provide Magic Cup ice cream TID with meals until PO intake improved   NUTRITION DIAGNOSIS: Inadequate oral intake related to pain, dysphagia and poor appetite as evidenced by </=10% meal completion.   Goal: Pt to meet >/= 90% of their estimated nutrition needs   Monitor:  PO intake, supplement acceptance, labs, weight trend  Reason for Assessment: Malnutrition Screening Tool, score of 2  80 y.o. female  Admitting Dx: <principal problem not specified>  ASSESSMENT: 80 y.o. year old female with with history of hypertension, diabetes mellitus and peripheral neuropathy. Presented 09/05/2014 with left-sided weakness and altered mental status. MRI of the brain showed patchy right MCA branch infarct with small hemorrhagic transformation in the right basal ganglia.   Pt in pain at time of visit, curled up, and did not assist in providing history. RD familiar with patient from [facility] admission. Per nursing notes pt only at 10% of breakfast and lunch today. Family reports that patient has been in pain- RN aware and MD has been paged to address pain per family. They state that patient is eating very little due to pain, dysphagia, and poor appetite. No reports of nausea or vomiting. Per family pt is drinking some Glucerna but, doesn't really like it. She ate a small amount of Magic Cup ice cream with breakfast and reported liking it.  Weight history shows pt has lot 13% of her body weight in the past year.    Labs:  Low hemoglobin, Glucose 71 to 165 mg/dL  Height: Ht Readings from Last 1 Encounters:  09/05/14 5\' 4"  (1.626 m)    Weight: Wt Readings from Last 1 Encounters:  09/10/14 123 lb 1.6 oz (55.838 kg)    Ideal Body Weight: 120 lbs  % Ideal Body Weight:  103%  Wt Readings from Last 10 Encounters:  09/10/14 123 lb 1.6 oz (55.838 kg)  09/06/14 122 lb 5.7 oz (55.5 kg)  09/11/13 141 lb 15.6 oz (64.4 kg)  08/29/13 133 lb 12.8 oz (60.691 kg)  06/16/13 135 lb (61.236 kg)  01/10/13 150 lb (68.04 kg)  11/10/11 150 lb (68.04 kg)  10/26/11 146 lb 12.8 oz (66.588 kg)    Usual Body Weight: 150 lbs  % Usual Body Weight: 82%  BMI:  Body mass index is 21.12 kg/(m^2).  Estimated Nutritional Needs: Kcal: 1550-1800 Protein: 70-85 grams Fluid: 1.5-1.8 L/day  Skin: +1 RLE and LLE edema; right neck incision  Diet Order: DIET - DYS 1, honey-thick  EDUCATION NEEDS: -No education needs identified at this time   Intake/Output Summary (Last 24 hours) at 09/11/14 1620 Last data filed at 09/11/14 1200  Gross per 24 hour  Intake    120 ml  Output      0 ml  Net    120 ml    Last BM: 2/7   Labs:   Recent Labs Lab 09/05/14 0744 09/05/14 0755 09/05/14 1302 09/06/14 0300 09/11/14 0615  NA 129* 130* 130* 137 138  K 4.4 4.5 3.8 3.5 4.3  CL 95* 97  --  106 109  CO2 22  --   --  24 22  BUN 27* 32*  --  20 11  CREATININE 1.02 0.80  --  0.84 0.69  CALCIUM 9.0  --   --  8.2* 8.8  GLUCOSE 518* 510*  --  129* 111*    CBG (last 3)   Recent Labs  09/11/14 0424 09/11/14 0831 09/11/14 1532  GLUCAP 109* 154* 71    Scheduled Meds: . aspirin  81 mg Oral Daily  . brimonidine  1 drop Both Eyes BID  . calcium-vitamin D  1 tablet Oral Q breakfast  . feeding supplement (GLUCERNA SHAKE)  237 mL Oral TID BM  . insulin aspart  0-15 Units Subcutaneous 6 times per day  . insulin glargine  20 Units Subcutaneous Daily  . metoCLOPramide  5 mg Oral TID AC & HS  . multivitamin with minerals  1 tablet Oral Daily    Continuous Infusions: . 0.9 % NaCl with KCl 20 mEq / L 75 mL/hr at 09/11/14 6606    Past Medical History  Diagnosis Date  . Transient ischemic attack   . HTN (hypertension)   . Hyperlipidemia   . DM (diabetes mellitus)   . GERD  (gastroesophageal reflux disease)   . Heart disease     PFO  . Amnesia 01/10/2013  . Patent foramen ovale with atrial septal aneurysm   . Complication of anesthesia   . PONV (postoperative nausea and vomiting)   . Family history of anesthesia complication     "My brother has trouble waking up."  . Glaucoma     Hx: of  . Early cataracts, bilateral     Hx: of  . Arthritis   . Stroke     Past Surgical History  Procedure Laterality Date  . US echocardiography  10-13-11    EF 60-65% normal  . Back surgery      disc   . Tonsillectomy    . Tee without cardioversion  10/30/2011    Procedure: TRANSESOPHAGEAL ECHOCARDIOGRAM (TEE);  Surgeon: Larey Dresser, MD;  Location: [place];  Service: Cardiovascular;  Laterality: N/A;  . Colonoscopy w/ biopsies and polypectomy      HX;of  . Tonsillectomy    . Tubal ligation    . Lumbar laminectomy/decompression microdiscectomy N/A 09/11/2013    Procedure: LUMBAR LAMINECTOMY/DECOMPRESSION MICRODISCECTOMY 2 LEVELS;  Surgeon: Ophelia Charter, MD;  Location: [place] [facility];  Service: Neurosurgery;  Laterality: N/A;  Lumbar Four-Five Laminectomy and Foraminotomy with redo Lumbar Five-Sacral One diskectomy  . Radiology with anesthesia N/A 09/05/2014    Procedure: RADIOLOGY WITH ANESTHESIA;  Surgeon: Rob Hickman, MD;  Location: [place];  Service: Radiology;  Laterality: N/A;    Pryor Ochoa RD, LDN Inpatient Clinical Dietitian Pager: [phone] After Hours Pager: [phone]

## 2014-09-11 NOTE — IPOC Note (Signed)
Overall Plan of Care [facility]) Patient Details Name: Christy Simmons MRN: [id number] DOB: [birth year]  Admitting Diagnosis: CVA  Hospital Problems: Active Problems:   ICH (intracerebral hemorrhage)   Basal ganglia infarction   Left-sided neglect   Left hemiparesis     Functional Problem List: Nursing Bladder, Bowel, Nutrition, Pain, Safety  PT Balance, Endurance, Motor, Pain, Safety  OT Balance, Edema, Endurance, Cognition, Motor, Pain, Safety, Perception  SLP Cognition, Nutrition  TR         Basic ADL's: OT Grooming, Bathing, Dressing, Toileting     Advanced  ADL's: OT       Transfers: PT Bed Mobility, Bed to Chair, Car, Manufacturing systems engineer, Metallurgist: PT Ambulation, Emergency planning/management officer, Stairs     Additional Impairments: OT None  SLP Swallowing, Social Cognition   Problem Solving, Memory  TR      Anticipated Outcomes Item Anticipated Outcome  Self Feeding n/a  Swallowing  supervision    Basic self-care  supervision  Toileting  supervision   Bathroom Transfers supervision  Bowel/Bladder  Incontinent to urine at times,no BM since 2/6, as per pt.   Transfers  Supervision  Locomotion  Supervision at ambulatory level.   Communication     Cognition  Supervision   Pain  pain on the back of the Neck.  Safety/Judgment  Pt. will be free from fall during her stay in rehab   Therapy Plan: PT Intensity: Minimum of 1-2 x/day ,45 to 90 minutes PT Frequency: 5 out of 7 days PT Duration Estimated Length of Stay: 19-21 days  OT Intensity: Minimum of 1-2 x/day, 45 to 90 minutes OT Frequency: 5 out of 7 days OT Duration/Estimated Length of Stay: 19-21 days SLP Intensity: Minumum of 1-2 x/day, 30 to 90 minutes SLP Frequency: 3 to 5 out of 7 days SLP Duration/Estimated Length of Stay: 21-28 days        Team Interventions: Nursing Interventions Patient/Family Education, Bladder Management, Pain Management  PT interventions Ambulation/gait  training, Balance/vestibular training, Cognitive remediation/compensation, Discharge planning, Disease management/prevention, DME/adaptive equipment instruction, Functional electrical stimulation, Functional mobility training, Neuromuscular re-education, Pain management, Patient/family education, Skin care/wound management, Splinting/orthotics, Stair training, Therapeutic Activities, Therapeutic Exercise, UE/LE Strength taining/ROM, UE/LE Coordination activities, Wheelchair propulsion/positioning  OT Interventions Balance/vestibular training, Discharge planning, Pain management, Self Care/advanced ADL retraining, Therapeutic Activities, UE/LE Coordination activities, Cognitive remediation/compensation, Functional mobility training, Patient/family education, Therapeutic Exercise, Community reintegration, DME/adaptive equipment instruction, Psychosocial support, UE/LE Strength taining/ROM  SLP Interventions Cognitive remediation/compensation, English as a second language teacher, Dysphagia/aspiration precaution training, Patient/family education, Internal/external aids, Environmental controls, Functional tasks, Therapeutic Exercise  TR Interventions    SW/CM Interventions Discharge Planning, Psychosocial Support, Patient/Family Education    Team Discharge Planning: Destination: PT-Home ,OT- Home , SLP-Home Projected Follow-up: PT-Home health PT, 24 hour supervision/assistance, OT-  24 hour supervision/assistance, Home health OT, SLP-Home Health SLP, 24 hour supervision/assistance Projected Equipment Needs: PT-To be determined, OT- Tub/shower seat, 3 in 1 bedside comode, SLP-Other (comment) (TBD) Equipment Details: PT- , OT-  Patient/family involved in discharge planning: PT- Patient,  OT-Family member/caregiver, SLP-Patient, Family member/caregiver  MD ELOS: 3 weeks Medical Rehab Prognosis:  Excellent Assessment: The patient has been admitted for CIR therapies with the diagnosis of right basal ganglia hemorrhage. The team  will be addressing functional mobility, strength, stamina, balance, safety, adaptive techniques and equipment, self-care, bowel and bladder mgt, patient and caregiver education, NMR, cognitive perceptual awareness, pain control. Goals have been set at supervision for mobility, self-care, cognition and communication.  Meredith Staggers, MD, FAAPMR      See Team Conference Notes for weekly updates to the plan of care

## 2014-09-11 NOTE — Progress Notes (Signed)
80 y.o. right handed female with history of hypertension, diabetes mellitus and peripheral neuropathy, PFO with atrial septal aneurysm maintained on aspirin 81 mg daily. Independent prior to admission living with her husband. Presented 09/05/2014 with left-sided weakness and altered mental status. CT of the head show well-defined area of hypodensity around the right basal ganglia. Patient did not receive TPA. CT angiogram head and neck showed distal right M1 occlusion, abnormal perfusion throughout essentially the entirety of the right MCA territory. Underwent complete revascularization of occluded right MCA per interventional radiology. MRI of the brain showed patchy right MCA branch infarct with small hemorrhagic transformation in the right basal ganglia but no significant midline shift. Echocardiogram with ejection fraction of 54% grade 1 diastolic dysfunction. Neurology services consulted maintained on aspirin for CVA prophylaxis. Patient did require intubation followed by critical care and extubated without difficulty 09/07/2014. A venous Doppler study lower extremities completed 09/07/2014 showed findings consistent with nonocclusive DVT right popliteal vein as well as DVT left posterior tibial vein. Interventional radiology consulted for plan of IVC filter and no other anticoagulation at this time due to hemorrhagic infarct and completed 09/09/2014. Modified barium swallow 09/08/2014 recommendations of dysphagia 1 honey thick liquid diet. Subjective/Complaints: Some neck soreness overnite, Right gaze preference Review of Systems - Negative except occ cough Objective: Vital Signs: Blood pressure 154/74, pulse 66, temperature 98.8 F (37.1 C), temperature source Oral, resp. rate 18, weight 55.838 kg (123 lb 1.6 oz), SpO2 97 %. Ir Ivc Filter Plmt / S&i /img Guid/mod Sed  09/09/2014   INDICATION: 80 year old with hemorrhage stroke and lower extremity DVT. Patient is not a candidate for anticoagulation  at this time. IVC filter needed for pulmonary embolism prophylaxis.  EXAM: IVC FILTER PLACEMENT; IVC VENOGRAM; ULTRASOUND FOR VASCULAR ACCESS  Physician: Stephan Minister. Anselm Pancoast, MD  FLUOROSCOPY TIME:  1 minutes and 54 seconds, 113.8 mGy  MEDICATIONS: None  ANESTHESIA/SEDATION: Moderate sedation time: None  CONTRAST:  40 mL Omnipaque 300  PROCEDURE: Informed consent was obtained for an IVC venogram and filter placement. Ultrasound demonstrated a patent right internal jugular vein. Ultrasound images were obtained for documentation. The right side of the neck was prepped and draped in a sterile fashion. Maximal barrier sterile technique was utilized including caps, mask, sterile gowns, sterile gloves, sterile drape, hand hygiene and skin antiseptic. The skin was anesthetized with 1% lidocaine. A 21 gauge needle was directed into the vein with ultrasound guidance and a micropuncture dilator set was placed. A wire was advanced into the IVC. The filter sheath was advanced over the wire into the IVC. An IVC venogram was performed. Fluoroscopic images were obtained for documentation. A Cook Celect filter was deployed below the lowest renal vein. A follow-up venogram was performed and the vascular sheath was removed with manual compression.  FINDINGS: IVC was patent. Bilateral renal veins were identified. The filter was deployed below the lowest renal vein. Follow-up venogram confirmed placement within the IVC and below the renal veins.  COMPLICATIONS: None  IMPRESSION: Successful placement of a retrievable IVC filter.  This IVC filter is potentially retrievable. The patient will be assessed for filter retrieval by Interventional Radiology in approximately 8-12 weeks. Further recommendations regarding filter retrieval, continued surveillance or declaration of device permanence, will be made at that time.   Electronically Signed   By: Markus Daft M.D.   On: 09/09/2014 16:44   Results for orders placed or performed during the hospital  encounter of 09/10/14 (from the past 72 hour(s))  Glucose, capillary  Status: Abnormal   Collection Time: 09/10/14  8:25 PM  Result Value Ref Range   Glucose-Capillary 113 (H) 70 - 99 mg/dL  Glucose, capillary     Status: Abnormal   Collection Time: 09/11/14 12:05 AM  Result Value Ref Range   Glucose-Capillary 109 (H) 70 - 99 mg/dL  Glucose, capillary     Status: Abnormal   Collection Time: 09/11/14  4:24 AM  Result Value Ref Range   Glucose-Capillary 109 (H) 70 - 99 mg/dL  CBC WITH DIFFERENTIAL     Status: Abnormal   Collection Time: 09/11/14  6:15 AM  Result Value Ref Range   WBC 7.6 4.0 - 10.5 K/uL   RBC 3.20 (L) 3.87 - 5.11 MIL/uL   Hemoglobin 9.4 (L) 12.0 - 15.0 g/dL   HCT 27.9 (L) 36.0 - 46.0 %   MCV 87.2 78.0 - 100.0 fL   MCH 29.4 26.0 - 34.0 pg   MCHC 33.7 30.0 - 36.0 g/dL   RDW 13.4 11.5 - 15.5 %   Platelets 211 150 - 400 K/uL   Neutrophils Relative % 66 43 - 77 %   Neutro Abs 5.0 1.7 - 7.7 K/uL   Lymphocytes Relative 21 12 - 46 %   Lymphs Abs 1.6 0.7 - 4.0 K/uL   Monocytes Relative 11 3 - 12 %   Monocytes Absolute 0.8 0.1 - 1.0 K/uL   Eosinophils Relative 2 0 - 5 %   Eosinophils Absolute 0.2 0.0 - 0.7 K/uL   Basophils Relative 0 0 - 1 %   Basophils Absolute 0.0 0.0 - 0.1 K/uL  Comprehensive metabolic panel     Status: Abnormal   Collection Time: 09/11/14  6:15 AM  Result Value Ref Range   Sodium 138 135 - 145 mmol/L   Potassium 4.3 3.5 - 5.1 mmol/L   Chloride 109 96 - 112 mmol/L   CO2 22 19 - 32 mmol/L   Glucose, Bld 111 (H) 70 - 99 mg/dL   BUN 11 6 - 23 mg/dL   Creatinine, Ser 0.69 0.50 - 1.10 mg/dL   Calcium 8.8 8.4 - 10.5 mg/dL   Total Protein 4.6 (L) 6.0 - 8.3 g/dL   Albumin 2.0 (L) 3.5 - 5.2 g/dL   AST 28 0 - 37 U/L   ALT 15 0 - 35 U/L   Alkaline Phosphatase 59 39 - 117 U/L   Total Bilirubin 0.8 0.3 - 1.2 mg/dL   GFR calc non Af Amer 81 (L) >90 mL/min   GFR calc Af Amer >90 >90 mL/min    Comment: (NOTE) The eGFR has been calculated using the  CKD EPI equation. This calculation has not been validated in all clinical situations. eGFR's persistently <90 mL/min signify possible Chronic Kidney Disease.    Anion gap 7 5 - 15     HEENT: normal Cardio: RRR and no murmur Resp: CTA B/L GI: BS positive and NT, ND Extremity:  Pulses positive and No Edema Skin:   Intact and Wound neck incision without drainage on dressing Neuro: Lethargic, Confused, Normal Sensory, Abnormal Motor 4/5 in LUE and LLE, 5- RUE and RLE and Inattention Musc/Skel:  Neck tender and Other tenderness around right neck incision site and SCM Gen NAD   Assessment/Plan: 1. Functional deficits secondary to right basal ganglia infarction from right MCA occlusion status post revascularization hemorrhagic conversion in the right putamen which require 3+ hours per day of interdisciplinary therapy in a comprehensive inpatient rehab setting. Physiatrist is providing close team supervision  and 24 hour management of active medical problems listed below. Physiatrist and rehab team continue to assess barriers to discharge/monitor patient progress toward functional and medical goals. FIM:                   Comprehension Comprehension: 5-Follows basic conversation/direction: With no assist  Expression Expression: 5-Expresses basic 90% of the time/requires cueing < 10% of the time.  Social Interaction Social Interaction: 7-Interacts appropriately with others - No medications needed.  Problem Solving Problem Solving: 4-Solves basic 75 - 89% of the time/requires cueing 10 - 24% of the time  Memory Memory: 3-Recognizes or recalls 50 - 74% of the time/requires cueing 25 - 49% of the time  Medical Problem List and Plan: 1. 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 DVT Prophylaxis/Anticoagulation: Nonocclusive DVT right popliteal vein and DVT  left posterior tibial vein. Status post IVC filter 09/09/2014 per interventional radiology 3. Pain Management: Ultram 50-100 mg twice a day as needed and Robaxin. Monitor with increased mobility 4. Dysphagia. Dysphagia 1 honey liquids. Monitor hydration with IV fluids 5. Neuropsych: This patient is capable of making decisions on her own behalf. 6. Skin/Wound Care: Routine skin checks 7. Fluids/Electrolytes/Nutrition: Strict I and O with follow-up chemistries 8. History of PFO with atrial septal aneurysm. Continue aspirin 9. Hypertension. No current antihypertensive medication. Patient on lisinopril-hydrochlorothiazide 20-25 milligrams daily prior to admission. Resume as needed 10. Diabetes mellitus with peripheral neuropathy. Hemoglobin A1c of 12. Lantus insulin 20 units daily. Check blood sugars before meals and at bedtime. Diabetic teaching  LOS (Days) 1 A FACE TO [place] EVALUATION WAS PERFORMED  Dinh Ayotte E 09/11/2014, 8:34 AM

## 2014-09-11 NOTE — H&P (View-Only) (Signed)
Physical Medicine and Rehabilitation Admission H&P   Chief Complaint  Patient presents with  . Code Stroke  : Chief complaint: Headache  HPI: Christy Simmons is a 80 y.o. right handed female with history of hypertension, diabetes mellitus and peripheral neuropathy, PFO with atrial septal aneurysm maintained on aspirin 81 mg daily. Independent prior to admission living with her husband. Presented 09/05/2014 with left-sided weakness and altered mental status. CT of the head show well-defined area of hypodensity around the right basal ganglia. Patient did not receive TPA. CT angiogram head and neck showed distal right M1 occlusion, abnormal perfusion throughout essentially the entirety of the right MCA territory. Underwent complete revascularization of occluded right MCA per interventional radiology. MRI of the brain showed patchy right MCA branch infarct with small hemorrhagic transformation in the right basal ganglia but no significant midline shift. Echocardiogram with ejection fraction of 85% grade 1 diastolic dysfunction. Neurology services consulted maintained on aspirin for CVA prophylaxis. Patient did require intubation followed by critical care and extubated without difficulty 09/07/2014. A venous Doppler study lower extremities completed 09/07/2014 showed findings consistent with nonocclusive DVT right popliteal vein as well as DVT left posterior tibial vein. Interventional radiology consulted for plan of IVC filter and no other anticoagulation at this time due to hemorrhagic infarct and completed 09/09/2014. Modified barium swallow 09/08/2014 recommendations of dysphagia 1 honey thick liquid diet. Physical and occupational therapy evaluations completed with recommendations of physical medicine rehabilitation consult. Patient was admitted for a comprehensive rehabilitation program  Patient is tired, some neck pain from IVC filter insertion site on the right side Per report, patient's  daughter has been encouraging pain medication use for patient even when she has been lethargic. Patient has been reportedly taking tramadol at home on an intermittent basis although the daughter reports patient taking Percocet at home on a consistent basis. Daughter has history of chronic low back pain status post multiple surgeries and has been on pain medication herself  ROS Review of Systems  Gastrointestinal: Positive for constipation.   GERD  Musculoskeletal: Positive for myalgias.  Neurological: Positive for headaches.  All other systems reviewed and are negative    Past Medical History  Diagnosis Date  . Transient ischemic attack   . HTN (hypertension)   . Hyperlipidemia   . DM (diabetes mellitus)   . GERD (gastroesophageal reflux disease)   . Heart disease     PFO  . Amnesia 01/10/2013  . Patent foramen ovale with atrial septal aneurysm   . Complication of anesthesia   . PONV (postoperative nausea and vomiting)   . Family history of anesthesia complication     "My brother has trouble waking up."  . Glaucoma     Hx: of  . Early cataracts, bilateral     Hx: of  . Arthritis    Past Surgical History  Procedure Laterality Date  . US echocardiography  10-13-11    EF 60-65% normal  . Back surgery      disc   . Tonsillectomy    . Tee without cardioversion  10/30/2011    Procedure: TRANSESOPHAGEAL ECHOCARDIOGRAM (TEE); Surgeon: Larey Dresser, MD; Location: [place]; Service: Cardiovascular; Laterality: N/A;  . Colonoscopy w/ biopsies and polypectomy      HX;of  . Tonsillectomy    . Tubal ligation    . Lumbar laminectomy/decompression microdiscectomy N/A 09/11/2013    Procedure: LUMBAR LAMINECTOMY/DECOMPRESSION MICRODISCECTOMY 2 LEVELS; Surgeon: Ophelia Charter, MD; Location: [place] [facility]; Service: Neurosurgery; Laterality: N/A; Lumbar  Four-Five  Laminectomy and Foraminotomy with redo Lumbar Five-Sacral One diskectomy  . Radiology with anesthesia N/A 09/05/2014    Procedure: RADIOLOGY WITH ANESTHESIA; Surgeon: Rob Hickman, MD; Location: [place]; Service: Radiology; Laterality: N/A;   Family History  Problem Relation Age of Onset  . Stroke      No family history of such  . Congenital heart disease      No family history of such  . Anesthesia problems Neg Hx   . Diabetes Maternal Grandfather    Social History:  reports that she has never smoked. She has never used smokeless tobacco. She reports that she does not drink alcohol or use illicit drugs. Allergies:  Allergies  Allergen Reactions  . Gabapentin Other (See Comments)    Caused depression  . Codeine     Headache   Medications Prior to Admission  Medication Sig Dispense Refill  . aspirin EC 81 MG tablet Take 81 mg by mouth daily.    . brimonidine (ALPHAGAN) 0.2 % ophthalmic solution Place 1 drop into both eyes 2 (two) times daily.     . Calcium Citrate-Vitamin D 250-200 MG-UNIT TABS Take 1 tablet by mouth daily.    . Cholecalciferol (VITAMIN D3) 1000 UNITS CAPS Take 1,000 Units by mouth daily.    Marland Kitchen esomeprazole (NEXIUM) 20 MG capsule Take 20 mg by mouth daily.     Marland Kitchen ibuprofen (ADVIL,MOTRIN) 200 MG tablet Take 200 mg by mouth daily as needed for mild pain.     Marland Kitchen insulin glargine (LANTUS) 100 UNIT/ML injection Inject 20 Units into the skin daily.     . insulin lispro (HUMALOG) 100 UNIT/ML injection Inject into the skin 3 (three) times daily before meals. Per sliding scale - average 15 units    . lisinopril-hydrochlorothiazide (PRINZIDE,ZESTORETIC) 20-25 MG per tablet Take 1 tablet by mouth daily.    . Magnesium 200 MG TABS Take 400 mg by mouth daily.    . metoCLOPramide (REGLAN) 5 MG tablet Take 5 mg by mouth 4 (four) times daily - before meals and at bedtime.      . Misc Natural Products (OSTEO BI-FLEX ADV JOINT SHIELD) TABS Take 1 tablet by mouth daily.    . Multiple Vitamin (MULITIVITAMIN WITH MINERALS) TABS Take 1 tablet by mouth daily.    . traMADol (ULTRAM) 50 MG tablet Take 50-100 mg by mouth 2 (two) times daily as needed for moderate pain.     . diazepam (VALIUM) 5 MG tablet Take 1 tablet (5 mg total) by mouth every 6 (six) hours as needed for muscle spasms. (Patient not taking: Reported on 09/05/2014) 60 tablet 0  . oxyCODONE-acetaminophen (PERCOCET/ROXICET) 5-325 MG per tablet Take 1-2 tablets by mouth every 4 (four) hours as needed for moderate pain. (Patient not taking: Reported on 09/05/2014) 60 tablet 0    Home: Home Living Family/patient expects to be discharged to:: Private residence Living Arrangements: Spouse/significant other Type of Home: House Home Access: Stairs to enter Technical brewer of Steps: 2 Entrance Stairs-Rails: None Home Layout: One level Home Equipment: Environmental consultant - 2 wheels, Cane - single point, Bedside commode Additional Comments: walk in shower and tub shower  Functional History: Prior Function Level of Independence: Independent  Functional Status:  Mobility: Bed Mobility Overal bed mobility: Needs Assistance Bed Mobility: Rolling, Sidelying to Sit Rolling: Min assist, +2 for physical assistance Sidelying to sit: Mod assist, +2 for physical assistance Supine to sit: Max assist General bed mobility comments: patient able to get BLE off EOB  alone; rolling min A to get initiated then pt. used bed rail to complete; mod A for posterior trunk and positioning of BLE to come to upright. Transfers Overall transfer level: Needs assistance Equipment used: 2 person hand held assist Transfers: Sit to/from Stand, Stand Pivot Transfers Sit to Stand: Mod assist, +2 physical assistance Stand pivot transfers: Mod assist, +2 physical assistance General transfer comment: Assist for  elevation to upright, Bilateral LE knee blocking and assist to initiate pivotal steps to chair. Increased time to perform. Ambulation/Gait Ambulation/Gait assistance: Mod assist, +2 physical assistance Ambulation Distance (Feet): 4 Feet Assistive device: 2 person hand held assist Gait Pattern/deviations: Shuffle, Step-to pattern, Decreased stride length, Trunk flexed Gait velocity: decreased Gait velocity interpretation: Below normal speed for age/gender General Gait Details: no buckling of LEs, slow, guarded gait to chair    ADL: ADL Overall ADL's : Needs assistance/impaired Eating/Feeding: NPO Grooming: Maximal assistance, Sitting (supported) Upper Body Bathing: Maximal assistance, Sitting (supported) Lower Body Bathing: Maximal assistance (with +2 mod A sit<>stand) Upper Body Dressing : Total assistance, Sitting (supported ) Lower Body Dressing: Total assistance (with +2 mod A sit<>stand) Toilet Transfer: Moderate assistance, +2 for physical assistance, Stand-pivot Toileting- Clothing Manipulation and Hygiene: Total assistance (with +2 mod A sit<>stand)  Cognition: Cognition Overall Cognitive Status: Impaired/Different from baseline Orientation Level: Oriented X4 Cognition Arousal/Alertness: Lethargic Behavior During Therapy: Flat affect Overall Cognitive Status: Impaired/Different from baseline Area of Impairment: Following commands Memory: Decreased short-term memory (last thing she remembers is going to bed, not getting up to BR) Following Commands: Follows one step commands with increased time General Comments: drowsy during session.   Physical Exam: Blood pressure 128/59, pulse 72, temperature 98.5 F (36.9 C), temperature source Axillary, resp. rate 18, height 5\' 4"  (1.626 m), weight 55.5 kg (122 lb 5.7 oz), SpO2 95 %. Physical Exam HENT:  Left facial weakness  Eyes:  Pupils reactive to light without nystagmus  Neck: Normal range of motion. Neck supple. No  thyromegaly present.  Cardiovascular:  Cardiac rate control  Respiratory: Effort normal and breath sounds normal.  Limited inspiratory effort  GI: Soft. Bowel sounds are normal. She exhibits no distension.  Neurological:  Pt appears fatigued but focuses with cueing. She was able to state her first and last name as well as age and date of birth. Followed simple one-step commands. Her voice is but a whisper but intelligible. Left facial droop. Motor strength is 4/5 in right deltoid, biceps, triceps, grip, hip flexor, knee extensor, ankle dorsal flexion plan flexion 3 minus in the left deltoid, biceps, triceps, grip, hip flexor, knee extensor, 2 minus ankle dorsiflexor plantar flexor Sensory reduced to light touch left upper and left lower extremity, intact on the right side---very inconsistent due to her fatigue  Skin: Skin is warm and dry.  Psychiatric:  flat   Lab Results Last 48 Hours    Results for orders placed or performed during the hospital encounter of 09/05/14 (from the past 48 hour(s))  Glucose, capillary Status: Abnormal   Collection Time: 09/07/14 8:04 AM  Result Value Ref Range   Glucose-Capillary 144 (H) 70 - 99 mg/dL   Comment 1 Documented in Chart    Comment 2 Notify RN   Glucose, capillary Status: Abnormal   Collection Time: 09/07/14 12:11 PM  Result Value Ref Range   Glucose-Capillary 133 (H) 70 - 99 mg/dL   Comment 1 Documented in Chart    Comment 2 Notify RN   Glucose, capillary Status: Abnormal   Collection  Time: 09/07/14 4:32 PM  Result Value Ref Range   Glucose-Capillary 107 (H) 70 - 99 mg/dL  Glucose, capillary Status: Abnormal   Collection Time: 09/07/14 7:58 PM  Result Value Ref Range   Glucose-Capillary 112 (H) 70 - 99 mg/dL   Comment 1 Documented in Char    Comment 2 Repeat Test   Glucose, capillary Status: Abnormal   Collection Time: 09/08/14 11:56 AM   Result Value Ref Range   Glucose-Capillary 135 (H) 70 - 99 mg/dL  Glucose, capillary Status: Abnormal   Collection Time: 09/08/14 4:26 PM  Result Value Ref Range   Glucose-Capillary 137 (H) 70 - 99 mg/dL  Glucose, capillary Status: Abnormal   Collection Time: 09/08/14 8:10 PM  Result Value Ref Range   Glucose-Capillary 141 (H) 70 - 99 mg/dL   Comment 1 Notify RN    Comment 2 Documented in Char   Glucose, capillary Status: Abnormal   Collection Time: 09/08/14 11:55 PM  Result Value Ref Range   Glucose-Capillary 103 (H) 70 - 99 mg/dL   Comment 1 Notify RN    Comment 2 Documented in Char   Glucose, capillary Status: Abnormal   Collection Time: 09/09/14 4:23 AM  Result Value Ref Range   Glucose-Capillary 48 (L) 70 - 99 mg/dL  Glucose, capillary Status: Abnormal   Collection Time: 09/09/14 5:00 AM  Result Value Ref Range   Glucose-Capillary 123 (H) 70 - 99 mg/dL      Imaging Results (Last 48 hours)    Dg Swallowing Func-speech Pathology  09/08/2014 Orbie Pyo [place], CCC-SLP 09/08/2014 12:26 PM Objective Swallowing Evaluation: Modified Barium Swallowing Study Patient Details Name: FUJIKO PICAZO MRN: [id number] Date of Birth: 10-10-1934 Today's Date: 09/08/2014 Time: SLP Start Time (ACUTE ONLY): 1045-SLP Stop Time (ACUTE ONLY): 1105 SLP Time Calculation (min) (ACUTE ONLY): 20 min Past Medical History: Past Medical History Diagnosis Date . Transient ischemic attack . HTN (hypertension) . Hyperlipidemia . DM (diabetes mellitus) . GERD (gastroesophageal reflux disease) . Heart disease PFO . Amnesia 01/10/2013 . Patent foramen ovale with atrial septal aneurysm . Complication of anesthesia . PONV (postoperative nausea and vomiting) . Family history of anesthesia complication "My brother has trouble waking up." . Glaucoma Hx: of . Early cataracts, bilateral  Hx: of . Arthritis Past Surgical History: Past Surgical History Procedure Laterality Date . US echocardiography 10-13-11 EF 60-65% normal . Back surgery disc . Tonsillectomy . Tee without cardioversion 10/30/2011 Procedure: TRANSESOPHAGEAL ECHOCARDIOGRAM (TEE); Surgeon: Larey Dresser, MD; Location: [place]; Service: Cardiovascular; Laterality: N/A; . Colonoscopy w/ biopsies and polypectomy HX;of . Tonsillectomy . Tubal ligation . Lumbar laminectomy/decompression microdiscectomy N/A 09/11/2013  Procedure: LUMBAR LAMINECTOMY/DECOMPRESSION MICRODISCECTOMY 2 LEVELS; Surgeon: Ophelia Charter, MD; Location: [place] [facility]; Service: Neurosurgery; Laterality: N/A; Lumbar Four-Five Laminectomy and Foraminotomy with redo Lumbar Five-Sacral One diskectomy . Radiology with anesthesia N/A 09/05/2014 Procedure: RADIOLOGY WITH ANESTHESIA; Surgeon: Rob Hickman, MD; Location: [place]; Service: Radiology; Laterality: N/A; HPI: HPI: 80 y.o. female with a past medical history significant for HTN, hyperlipidemia, DM, TIA, PFO with atrial septal aneurysm admitted with left hemiparesis and decreased responsiveness. MRI showed Multiple areas of acute infarct are present indicative of cerebral emboli, right MCA branch infarcts including the right insula and basal ganglia with small hemorrhagic transformation in the right basal ganglis. In addition, infarcts in the left parietal lobe, bilateral PICA and left occipital lobe. Underwent cerebral angiogram with complete revascularization of occluded RT MCA 2/6. Intubated 2/6-2/7. MBS recommended following bedside swallow assessment. No Data  Recorded Assessment / Plan / Recommendation CHL IP CLINICAL IMPRESSIONS 09/08/2014 Dysphagia Diagnosis Mild oral phase dysphagia;Moderate oral phase dysphagia;Mild pharyngeal phase dysphagia Clinical impression Pt exhibits a mild-moderate oral dyspahgia due to motor  weakness leading to mild lingual residue and decreased mandibular excursion and delayed transit with solid and mechanical soft textures. Pharyngeal phase characterized by primarily motor impairments of decreased laryngeal elevation and incomplete closure with penetration of nectar barium (decreased sensation). No significant pharyngeal residue. Pt's demonstrating fatigue and decreased endurance throughout study increasing aspiration risk. Throat clearing present during when barium was not observed in laryngeal vestibule. Recommend Dys 1 diet and honey thick liquids, full supervision, crush pills, no straws. ST will continue intervention. CHL IP TREATMENT RECOMMENDATION 09/08/2014 Treatment Plan Recommendations Therapy as outlined in treatment plan below CHL IP DIET RECOMMENDATION 09/08/2014 Diet Recommendations Dysphagia 1 (Puree);Honey-thick liquid Liquid Administration via Cup;No straw Medication Administration Crushed with puree Compensations Slow rate;Small sips/bites Postural Changes and/or Swallow Maneuvers Seated upright 90 degrees;Upright 30-60 min after meal CHL IP OTHER RECOMMENDATIONS 09/08/2014 Recommended Consults (None) Oral Care Recommendations Oral care BID Other Recommendations Order thickener from pharmacy CHL IP FOLLOW UP RECOMMENDATIONS 09/08/2014 Follow up Recommendations (No Data) CHL IP FREQUENCY AND DURATION 09/08/2014 Speech Therapy Frequency (ACUTE ONLY) min 2x/week Treatment Duration 2 weeks Pertinent Vitals/Pain Pt reported no pain since received pain meds this morning SLP Swallow Goals No flowsheet data found. No flowsheet data found. CHL IP REASON FOR REFERRAL 09/08/2014 Reason for Referral Objectively evaluate swallowing function CHL IP ORAL PHASE 09/08/2014 Lips (None) Tongue (None) Mucous membranes (None) Nutritional status (None) Other (None) Oxygen therapy (None) Oral Phase Impaired Oral - Pudding Teaspoon (None) Oral -  Pudding Cup (None) Oral - Honey Teaspoon (None) Oral - Honey Cup Lingual/palatal residue Oral - Honey Syringe (None) Oral - Nectar Teaspoon (None) Oral - Nectar Cup (None) Oral - Nectar Straw (None) Oral - Nectar Syringe (None) Oral - Ice Chips (None) Oral - Thin Teaspoon (None) Oral - Thin Cup (None) Oral - Thin Straw (None) Oral - Thin Syringe (None) Oral - Puree (None) Oral - Mechanical Soft Delayed oral transit;Weak lingual manipulation Oral - Regular Weak lingual manipulation;Delayed oral transit Oral - Multi-consistency (None) Oral - Pill (None) Oral Phase - Comment (None) CHL IP PHARYNGEAL PHASE 09/08/2014 Pharyngeal Phase Impaired Pharyngeal - Pudding Teaspoon (None) Penetration/Aspiration details (pudding teaspoon) (None) Pharyngeal - Pudding Cup (None) Penetration/Aspiration details (pudding cup) (None) Pharyngeal - Honey Teaspoon (None) Penetration/Aspiration details (honey teaspoon) (None) Pharyngeal - Honey Cup [facility] Penetration/Aspiration details (honey cup) (None) Pharyngeal - Honey Syringe (None) Penetration/Aspiration details (honey syringe) (None) Pharyngeal - Nectar Teaspoon WFL Penetration/Aspiration details (nectar teaspoon) (None) Pharyngeal - Nectar Cup Penetration/Aspiration during swallow;Reduced laryngeal elevation;Reduced airway/laryngeal closure Penetration/Aspiration details (nectar cup) Material enters airway, remains ABOVE vocal cords and not ejected out Pharyngeal - Nectar Straw (None) Penetration/Aspiration details (nectar straw) (None) Pharyngeal - Nectar Syringe (None) Penetration/Aspiration details (nectar syringe) (None) Pharyngeal - Ice Chips (None) Penetration/Aspiration details (ice chips) (None) Pharyngeal - Thin Teaspoon (None) Penetration/Aspiration details (thin teaspoon) (None) Pharyngeal - Thin Cup (None) Penetration/Aspiration details (thin cup) (None) Pharyngeal - Thin Straw (None) Penetration/Aspiration details (thin  straw) (None) Pharyngeal - Thin Syringe (None) Penetration/Aspiration details (thin syringe') (None) Pharyngeal - Puree Delayed swallow initiation;Premature spillage to valleculae Penetration/Aspiration details (puree) (None) Pharyngeal - Mechanical Soft WFL Penetration/Aspiration details (mechanical soft) (None) Pharyngeal - Regular WFL Penetration/Aspiration details (regular) (None) Pharyngeal - Multi-consistency (None) Penetration/Aspiration details (multi-consistency) (None) Pharyngeal - Pill (None) Penetration/Aspiration details (pill) (None) Pharyngeal Comment (  None) CHL IP CERVICAL ESOPHAGEAL PHASE 09/08/2014 Cervical Esophageal Phase WFL Pudding Teaspoon (None) Pudding Cup (None) Honey Teaspoon (None) Honey Cup (None) Honey Syringe (None) Nectar Teaspoon (None) Nectar Cup (None) Nectar Straw (None) Nectar Syringe (None) Thin Teaspoon (None) Thin Cup (None) Thin Straw (None) Thin Syringe (None) Cervical Esophageal Comment (None) No flowsheet data found. [place] Siren 09/08/2014, 12:24 PM Orbie Pyo Colvin Caroli.[employer] Pager [phone] Orbie Pyo Whitney.Ed CCC-SLP Pager [phone]        Medical Problem List and Plan: 1. 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 DVT Prophylaxis/Anticoagulation: Nonocclusive DVT right popliteal vein and DVT left posterior tibial vein. Status post IVC filter 09/09/2014 per interventional radiology 3. Pain Management: Ultram 50-100 mg twice a day as needed and Robaxin. Monitor with increased mobility 4. Dysphagia. Dysphagia 1 honey liquids. Monitor hydration with IV fluids 5. Neuropsych: This patient is capable of making decisions on her own behalf. 6. Skin/Wound Care: Routine skin checks 7. Fluids/Electrolytes/Nutrition: Strict I and O with follow-up chemistries 8. History of PFO with  atrial septal aneurysm. Continue aspirin 9. Hypertension. No current antihypertensive medication. Patient on lisinopril-hydrochlorothiazide 20-25 milligrams daily prior to admission. Resume as needed 10. Diabetes mellitus with peripheral neuropathy. Hemoglobin A1c of 12. Lantus insulin 20 units daily. Check blood sugars before meals and at bedtime. Diabetic teaching   Post Admission Physician Evaluation: 1. Functional deficits secondary to right basal ganglia infarction from right MCA occlusion status post revascularization hemorrhagic conversion in the right putamen. 2. Patient is admitted to receive collaborative, interdisciplinary care between the physiatrist, rehab nursing staff, and therapy team. 3. Patient's level of medical complexity and substantial therapy needs in context of that medical necessity cannot be provided at a lesser intensity of care such as a SNF. 4. Patient has experienced substantial functional loss from his/her baseline which was documented above under the "Functional History" and "Functional Status" headings. Judging by the patient's diagnosis, physical exam, and functional history, the patient has potential for functional progress which will result in measurable gains while on inpatient rehab. These gains will be of substantial and practical use upon discharge in facilitating mobility and self-care at the household level. 5. Physiatrist will provide 24 hour management of medical needs as well as oversight of the therapy plan/treatment and provide guidance as appropriate regarding the interaction of the two. 6. 24 hour rehab nursing will assist with bladder management, bowel management, safety, skin/wound care, disease management, medication administration, pain management and patient education and help integrate therapy concepts, techniques,education, etc. 7. PT will assess and treat for/with: pre gait, gait training, endurance , safety, equipment, neuromuscular re  education. Goals are: min/sup. 8. OT will assess and treat for/with: ADLs, Cognitive perceptual skills, Neuromuscular re education, safety, endurance, equipment. Goals are: min/sup. Therapy may proceed with showering this patient. 9. SLP will assess and treat for/with: Memory, attention, concentration, problem solving, assess for left inattention. Goals are: Improve cognitive tests allow medication management supervision. 10. Case Management and Social Worker will assess and treat for psychological issues and discharge planning. 11. Team conference will be held weekly to assess progress toward goals and to determine barriers to discharge. 12. Patient will receive at least 3 hours of therapy per day at least 5 days per week. 13. ELOS: 13-17 days  14. Prognosis: good     [name] M.D. [facility] FAAPM&R (Sports Med, Neuromuscular Med) Diplomate  Am Board of Electrodiagnostic Med

## 2014-09-11 NOTE — Evaluation (Signed)
Speech Language Pathology Assessment and Plan  Patient Details  Name: Christy Simmons MRN: [id number] Date of Birth: 09-26-1934  SLP Diagnosis: Dysphagia;Cognitive Impairments  Rehab Potential: Good ELOS: 21-28 days     Today's Date: 09/11/2014 SLP Individual Time: 0900-1000 SLP Individual Time Calculation (min): 60 min   Problem List:  Patient Active Problem List   Diagnosis Date Noted  . Left-sided neglect 09/11/2014  . Left hemiparesis 09/11/2014  . Basal ganglia infarction 09/10/2014  . ICH (intracerebral hemorrhage)   . Malnutrition of moderate degree 09/07/2014  . Cytotoxic brain edema   . Stroke   . Acute respiratory failure with hypoxia 09/05/2014  . Stroke, acute, embolic   . Lumbar stenosis with neurogenic claudication 09/11/2013  . Amnesia 01/10/2013  . Patent foramen ovale with atrial septal aneurysm 10/26/2011   Past Medical History:  Past Medical History  Diagnosis Date  . Transient ischemic attack   . HTN (hypertension)   . Hyperlipidemia   . DM (diabetes mellitus)   . GERD (gastroesophageal reflux disease)   . Heart disease     PFO  . Amnesia 01/10/2013  . Patent foramen ovale with atrial septal aneurysm   . Complication of anesthesia   . PONV (postoperative nausea and vomiting)   . Family history of anesthesia complication     "My brother has trouble waking up."  . Glaucoma     Hx: of  . Early cataracts, bilateral     Hx: of  . Arthritis    Past Surgical History:  Past Surgical History  Procedure Laterality Date  . US echocardiography  10-13-11    EF 60-65% normal  . Back surgery      disc   . Tonsillectomy    . Tee without cardioversion  10/30/2011    Procedure: TRANSESOPHAGEAL ECHOCARDIOGRAM (TEE);  Surgeon: Larey Dresser, MD;  Location: [facility];  Service: Cardiovascular;  Laterality: N/A;  . Colonoscopy w/ biopsies and polypectomy      HX;of  . Tonsillectomy    . Tubal ligation    . Lumbar laminectomy/decompression microdiscectomy  N/A 09/11/2013    Procedure: LUMBAR LAMINECTOMY/DECOMPRESSION MICRODISCECTOMY 2 LEVELS;  Surgeon: Ophelia Charter, MD;  Location: [facility];  Service: Neurosurgery;  Laterality: N/A;  Lumbar Four-Five Laminectomy and Foraminotomy with redo Lumbar Five-Sacral One diskectomy  . Radiology with anesthesia N/A 09/05/2014    Procedure: RADIOLOGY WITH ANESTHESIA;  Surgeon: Rob Hickman, MD;  Location: [facility];  Service: Radiology;  Laterality: N/A;    Assessment / Plan / Recommendation Clinical Impression  Christy Simmons is a 80 y.o. right handed female with history of hypertension, diabetes mellitus and peripheral neuropathy, PFO with atrial septal aneurysm maintained on aspirin 81 mg daily. Independent prior to admission living with her husband. Presented 09/05/2014 with left-sided weakness and altered mental status. CT of the head show well-defined area of hypodensity around the right basal ganglia.  MRI of the brain showed patchy right MCA branch infarct with small hemorrhagic transformation in the right basal ganglia but no significant midline shift. Patient did require intubation followed by critical care and extubated without difficulty 09/07/2014. Modified barium swallow 09/08/2014 recommendations of dysphagia 1 honey thick liquid diet. Patient was admitted for a comprehensive rehabilitation program on 09/10/2014.  SLP evaluation completed on 09/11/2014 with the following results:  Pt presents with s/s of a moderate oropharyngeal dysphagia characterized by generalized oral motor weakness resulting in decreased manipulation and prolonged posterior transit of materials.  Pt also presents  with suspected delayed swallow initiation following puree and liquid consistencies.  Pt was noted with soft throat clear following trials of thin liquids and intermittent complaints of pain when swallowing pureed boluses; no other overt s/s of aspiration or difficulty swallowing were noted across any other consistencies  assessed.  Therefore, recommend that pt remain on dys 1 solids with honey thick liquids. Recommend that pt be initiated on the water protocol to continue working towards liquids advancement and to improve hydration.   Additionally, pt presents with overall mild cognitive deficits during basic, informal evaluation measures.  She was noted with appropriate intellectual awareness of deficits, but exhibited decreased recall of new information, and decreased alertness due to fatigue. Pt reports that she was independent for medication and financial management prior to admission; therefore, she would benefit from skilled ST while inpatient in order to maximize functional independence and reduce burden of care prior to discharge.  Anticipate that pt will need 24/7 supervision and assistance for medication and financial management at discharge.  Pt may also benefit from skilled ST follow up at discharge in either the home health or outpatient setting pending progress made while inpatient.       Skilled Therapeutic Interventions          Cognitive-linguistic and bedside swallow evaluation completed with results and recommendations reviewed with patient.     SLP Assessment  Patient will need skilled Speech Lanaguage Pathology Services during CIR admission    Recommendations  Diet Recommendations: Dysphagia 1 (Puree);Honey-thick liquid Liquid Administration via: Cup;No straw Medication Administration: Crushed with puree Supervision: Patient able to self feed;Full supervision/cueing for compensatory strategies Compensations: Slow rate;Small sips/bites Postural Changes and/or Swallow Maneuvers: Seated upright 90 degrees;Upright 30-60 min after meal Oral Care Recommendations: Oral care BID Patient destination: Home Follow up Recommendations: Home Health SLP;24 hour supervision/assistance Equipment Recommended: Other (comment) (TBD)    SLP Frequency 3 to 5 out of 7 days   SLP Treatment/Interventions Cognitive  remediation/compensation;Cueing hierarchy;Dysphagia/aspiration precaution training;Patient/family education;Internal/external aids;Environmental controls;Functional tasks;Therapeutic Exercise    Pain Pain Assessment Pain Assessment: Faces Pain Score: 4  Faces Pain Scale: Hurts a little bit Pain Location: Shoulder Pain Orientation: Right Pain Descriptors / Indicators: Aching Pain Intervention(s): Repositioned Prior Functioning Cognitive/Linguistic Baseline: Within functional limits Type of Home: House  Lives With: Spouse;Daughter Available Help at Discharge: Family;Available 24 hours/day Education: some college Vocation: Retired  Industrial/product designer Term Goals: Week 1: SLP Short Term Goal 1 (Week 1): Pt will tolerate presentations of her currently prescribed diet with no overt s/s of aspiration and min cues for use of swallowing precautions.  SLP Short Term Goal 2 (Week 1): Pt will tolerate trials of advanced consistencies with min cues for use of swallowing precautions and no overt s/s of aspiration  SLP Short Term Goal 3 (Week 1): Pt will improve functional problem solving over 80% of observable opportunities and min assist cues.   SLP Short Term Goal 4 (Week 1): Pt will recall parameters of the water protocol with mod assist question cues.    See FIM for current functional status Refer to Care Plan for Long Term Goals  Recommendations for other services: None  Discharge Criteria: Patient will be discharged from SLP if patient refuses treatment 3 consecutive times without medical reason, if treatment goals not met, if there is a change in medical status, if patient makes no progress towards goals or if patient is discharged from hospital.  The above assessment, treatment plan, treatment alternatives and goals were discussed and mutually agreed  upon: by patient  Emilio Math 09/11/2014, 12:16 PM

## 2014-09-12 ENCOUNTER — Inpatient Hospital Stay (HOSPITAL_COMMUNITY): Payer: Medicare Other | Admitting: Occupational Therapy

## 2014-09-12 ENCOUNTER — Inpatient Hospital Stay (HOSPITAL_COMMUNITY): Payer: Medicare Other | Admitting: Speech Pathology

## 2014-09-12 ENCOUNTER — Encounter (HOSPITAL_COMMUNITY): Payer: Medicare Other | Admitting: Occupational Therapy

## 2014-09-12 ENCOUNTER — Inpatient Hospital Stay (HOSPITAL_COMMUNITY): Payer: Medicare Other | Admitting: Physical Therapy

## 2014-09-12 LAB — GLUCOSE, CAPILLARY
GLUCOSE-CAPILLARY: 128 mg/dL — AB (ref 70–99)
GLUCOSE-CAPILLARY: 145 mg/dL — AB (ref 70–99)
GLUCOSE-CAPILLARY: 88 mg/dL (ref 70–99)
Glucose-Capillary: 81 mg/dL (ref 70–99)
Glucose-Capillary: 76 mg/dL (ref 70–99)
Glucose-Capillary: 130 mg/dL — ABNORMAL HIGH (ref 70–99)

## 2014-09-12 LAB — TROPONIN I: TROPONIN I: 0.08 ng/mL — AB (ref <0.031)

## 2014-09-12 MED ORDER — OXYCODONE HCL 5 MG PO TABS
5.0000 mg | ORAL_TABLET | Freq: Two times a day (BID) | ORAL | Status: AC | PRN
  Administered 2014-09-12 (×2): 5 mg via ORAL
  Filled 2014-09-12 (×2): qty 1

## 2014-09-12 MED ORDER — INSULIN GLARGINE 100 UNIT/ML ~~LOC~~ SOLN: 10.0000 [IU] | Freq: Every day | SUBCUTANEOUS | Status: DC

## 2014-09-12 MED ORDER — INSULIN GLARGINE 100 UNIT/ML ~~LOC~~ SOLN
10.0000 [IU] | Freq: Every day | SUBCUTANEOUS | Status: DC
  Administered 2014-09-13: 10 [IU] via SUBCUTANEOUS
  Filled 2014-09-12 (×2): qty 0.1

## 2014-09-12 MED ORDER — NITROGLYCERIN 0.4 MG SL SUBL
0.4000 mg | SUBLINGUAL_TABLET | SUBLINGUAL | Status: DC | PRN
  Administered 2014-09-13: 0.4 mg via SUBLINGUAL

## 2014-09-12 MED ORDER — NITROGLYCERIN 0.4 MG SL SUBL
SUBLINGUAL_TABLET | SUBLINGUAL | Status: AC
  Administered 2014-09-12: 0.4 mg
  Filled 2014-09-12: qty 1

## 2014-09-12 NOTE — Progress Notes (Signed)
Physical Therapy Session Note  Patient Details  Name: Christy Simmons MRN: [id number] Date of Birth: February 24, 1935  Today's Date: 09/12/2014 PT Individual Time: 1345-1500 PT Individual Time Calculation (min): 75 min   Short Term Goals: Week 1:  PT Short Term Goal 1 (Week 1): Pt will perform sit<>stand at mod A level PT Short Term Goal 2 (Week 1): Pt will perform functional transfers consistently at mod A level PT Short Term Goal 3 (Week 1): Pt will perform dynamic standing activity for 3-5 mins at mod A level PT Short Term Goal 4 (Week 1): Pt will self propel w/c x 75' using BUEs/LEs (by whatever means possible) at min A level PT Short Term Goal 5 (Week 1): Pt will ambulate x 25' w/ LRAD at max A of single therapist.   Skilled Therapeutic Interventions/Progress Updates:  Pt was seen bedside in the pm with husband at bedside. Assisted nurse tech with rolling for hygiene. Pt rolled L with side rail and min to mod A, pt rolled R with side rail and max A. Pt transferred supine to edge of bed with mod A and side rail. Pt transferred edge of bed to w/c with max A and verbal cues. Pt propelled w/c about 20 feet with B LEs and then another 30 feet with R LE only. Pt transferred sit to stand multiple times in the gym, while standing treatment focused on upright posture and weight shifting. Pt ambulated with rolling walker and max A, pt required verbal and tactile cues for sequencing and safety. Pt propelled w/c about 100 feet with R LE and S. Pt returned to room. Pt transferred w/c to edge of bed with max A and verbal cues. Pt transferred edge of bed to supine with mod A.   Therapy Documentation Precautions:  Precautions Precautions: Fall Precaution Comments: L hemiparesis, ataxia Restrictions Weight Bearing Restrictions: No General:   Pain: Pt c/o 8/10 pain R neck/shoulder region.   Locomotion : Ambulation Ambulation/Gait Assistance: 2: Max assist   See FIM for current functional  status  Therapy/Group: Individual Therapy  Dub Amis 09/12/2014, 3:20 PM

## 2014-09-12 NOTE — Progress Notes (Signed)
Patient ID: Christy Simmons, female   DOB: [birth year], 80 y.o.   MRN: [id number]   09/12/13.  80 y.o. right handed female with history of hypertension, diabetes mellitus and peripheral neuropathy, PFO with atrial septal aneurysm maintained on aspirin 81 mg daily.  Presented 09/05/2014 with left-sided weakness and altered mental status. CT of the head show well-defined area of hypodensity around the right basal ganglia.  Underwent complete revascularization of occluded right MCA per interventional radiology. MRI of the brain showed patchy right MCA branch infarct with small hemorrhagic transformation in the right basal ganglia but no significant midline shift. Followed by critical care and extubated without difficulty 09/07/2014.  A venous Doppler study lower extremities completed 09/07/2014 showed findings consistent with nonocclusive DVT right popliteal vein as well as DVT left posterior tibial vein. Interventional radiology consulted for plan of IVC filter and no other anticoagulation at this time due to hemorrhagic infarct and completed 09/09/2014. Modified barium swallow 09/08/2014 recommendations of dysphagia 1 honey thick liquid diet.  Subjective/Complaints: Some neck soreness overnite, as well as L sided anterior CP;  EKG obtained earlier and reviewed Review of Systems - Negative except occ cough   Past Medical History  Diagnosis Date  . Transient ischemic attack   . HTN (hypertension)   . Hyperlipidemia   . DM (diabetes mellitus)   . GERD (gastroesophageal reflux disease)   . Heart disease     PFO  . Amnesia 01/10/2013  . Patent foramen ovale with atrial septal aneurysm   . Complication of anesthesia   . PONV (postoperative nausea and vomiting)   . Family history of anesthesia complication     "My brother has trouble waking up."  . Glaucoma     Hx: of  . Early cataracts, bilateral     Hx: of  . Arthritis   . Stroke      Intake/Output Summary (Last 24 hours) at 09/12/14 0915 Last  data filed at 09/11/14 1700  Gross per 24 hour  Intake    180 ml  Output      0 ml  Net    180 ml    Patient Vitals for the past 24 hrs:  BP Temp Temp src Pulse Resp SpO2  09/12/14 0613 137/61 mmHg 98.5 F (36.9 C) Oral 67 18 100 %  09/12/14 0235 137/60 mmHg 97.7 F (36.5 C) Oral 66 16 100 %  09/11/14 1410 (!) 124/55 mmHg 98 F (36.7 C) Oral 70 18 98 %    CBG (last 3)   Recent Labs  09/12/14 0035 09/12/14 0318 09/12/14 0751  GLUCAP 88 81 76    Objective: Vital Signs: Blood pressure 137/61, pulse 67, temperature 98.5 F (36.9 C), temperature source Oral, resp. rate 18, weight 123 lb 1.6 oz (55.838 kg), SpO2 100 %. No results found. Results for orders placed or performed during the hospital encounter of 09/10/14 (from the past 72 hour(s))  Glucose, capillary     Status: Abnormal   Collection Time: 09/10/14  8:25 PM  Result Value Ref Range   Glucose-Capillary 113 (H) 70 - 99 mg/dL  Glucose, capillary     Status: Abnormal   Collection Time: 09/11/14 12:05 AM  Result Value Ref Range   Glucose-Capillary 109 (H) 70 - 99 mg/dL  Glucose, capillary     Status: Abnormal   Collection Time: 09/11/14  4:24 AM  Result Value Ref Range   Glucose-Capillary 109 (H) 70 - 99 mg/dL  CBC WITH DIFFERENTIAL  Status: Abnormal   Collection Time: 09/11/14  6:15 AM  Result Value Ref Range   WBC 7.6 4.0 - 10.5 K/uL   RBC 3.20 (L) 3.87 - 5.11 MIL/uL   Hemoglobin 9.4 (L) 12.0 - 15.0 g/dL   HCT 27.9 (L) 36.0 - 46.0 %   MCV 87.2 78.0 - 100.0 fL   MCH 29.4 26.0 - 34.0 pg   MCHC 33.7 30.0 - 36.0 g/dL   RDW 13.4 11.5 - 15.5 %   Platelets 211 150 - 400 K/uL   Neutrophils Relative % 66 43 - 77 %   Neutro Abs 5.0 1.7 - 7.7 K/uL   Lymphocytes Relative 21 12 - 46 %   Lymphs Abs 1.6 0.7 - 4.0 K/uL   Monocytes Relative 11 3 - 12 %   Monocytes Absolute 0.8 0.1 - 1.0 K/uL   Eosinophils Relative 2 0 - 5 %   Eosinophils Absolute 0.2 0.0 - 0.7 K/uL   Basophils Relative 0 0 - 1 %   Basophils  Absolute 0.0 0.0 - 0.1 K/uL  Comprehensive metabolic panel     Status: Abnormal   Collection Time: 09/11/14  6:15 AM  Result Value Ref Range   Sodium 138 135 - 145 mmol/L   Potassium 4.3 3.5 - 5.1 mmol/L   Chloride 109 96 - 112 mmol/L   CO2 22 19 - 32 mmol/L   Glucose, Bld 111 (H) 70 - 99 mg/dL   BUN 11 6 - 23 mg/dL   Creatinine, Ser 0.69 0.50 - 1.10 mg/dL   Calcium 8.8 8.4 - 10.5 mg/dL   Total Protein 4.6 (L) 6.0 - 8.3 g/dL   Albumin 2.0 (L) 3.5 - 5.2 g/dL   AST 28 0 - 37 U/L   ALT 15 0 - 35 U/L   Alkaline Phosphatase 59 39 - 117 U/L   Total Bilirubin 0.8 0.3 - 1.2 mg/dL   GFR calc non Af Amer 81 (L) >90 mL/min   GFR calc Af Amer >90 >90 mL/min    Comment: (NOTE) The eGFR has been calculated using the CKD EPI equation. This calculation has not been validated in all clinical situations. eGFR's persistently <90 mL/min signify possible Chronic Kidney Disease.    Anion gap 7 5 - 15  Glucose, capillary     Status: Abnormal   Collection Time: 09/11/14  8:31 AM  Result Value Ref Range   Glucose-Capillary 154 (H) 70 - 99 mg/dL   Comment 1 Notify RN   Glucose, capillary     Status: None   Collection Time: 09/11/14  3:32 PM  Result Value Ref Range   Glucose-Capillary 71 70 - 99 mg/dL  Glucose, capillary     Status: Abnormal   Collection Time: 09/11/14  8:17 PM  Result Value Ref Range   Glucose-Capillary 126 (H) 70 - 99 mg/dL  Glucose, capillary     Status: Abnormal   Collection Time: 09/11/14 11:56 PM  Result Value Ref Range   Glucose-Capillary 46 (L) 70 - 99 mg/dL  Glucose, capillary     Status: None   Collection Time: 09/12/14 12:35 AM  Result Value Ref Range   Glucose-Capillary 88 70 - 99 mg/dL  Glucose, capillary     Status: None   Collection Time: 09/12/14  3:18 AM  Result Value Ref Range   Glucose-Capillary 81 70 - 99 mg/dL  Troponin I     Status: Abnormal   Collection Time: 09/12/14  4:05 AM  Result Value Ref Range  Troponin I 0.08 (H) <0.031 ng/mL    Comment:         PERSISTENTLY INCREASED TROPONIN VALUES IN THE RANGE OF 0.04-0.49 ng/mL CAN BE SEEN IN:       -UNSTABLE ANGINA       -CONGESTIVE HEART FAILURE       -MYOCARDITIS       -CHEST TRAUMA       -ARRYHTHMIAS       -LATE PRESENTING MYOCARDIAL INFARCTION       -COPD   CLINICAL FOLLOW-UP RECOMMENDED.   Glucose, capillary     Status: None   Collection Time: 09/12/14  7:51 AM  Result Value Ref Range   Glucose-Capillary 76 70 - 99 mg/dL    Gen- alert c/o neck and L sided chest wall pain HEENT: normal Cardio: RRR and no murmur Resp: CTA B/L GI: BS positive and NT, ND Extremity:  Pulses positive and No Edema Skin:   Intact and Wound neck incision without drainage on dressing Neuro: Lethargic, Confused, Normal Sensory, Abnormal Motor 4/5 in LUE and LLE, 5- RUE and RLE and Inattention Musc/Skel:  Neck tender and Other tenderness around right neck incision site and SCM Gen NAD  Chest- very tender to gentle palpation   Medical Problem List and Plan: 1. 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 DVT Prophylaxis/Anticoagulation: Nonocclusive DVT right popliteal vein and DVT left posterior tibial vein. Status post IVC filter 09/09/2014 per interventional radiology 3. Pain Management: Ultram 50-100 mg twice a day as needed and Robaxin. Monitor with increased mobility 4. Dysphagia. Dysphagia 1 honey liquids. Monitor hydration with IV fluids 5.  Anterior chest wall pain- will continue analgesia   6. Hypertension. No current antihypertensive medication. Patient on lisinopril-hydrochlorothiazide 20-25 milligrams daily prior to admission. Resume as needed 7. Diabetes mellitus with peripheral neuropathy. Hemoglobin A1c of 12. Lantus insulin 20 units daily. Check blood sugars before meals and at bedtime. Diabetic teaching  LOS (Days) 2 A FACE TO FACE EVALUATION WAS  PERFORMED  Nyoka Cowden 09/12/2014, 9:09 AM

## 2014-09-12 NOTE — Progress Notes (Signed)
Family asked if it's ok for them to use salonpas brought in from home on her legs. Dr. Burnice Logan notified. Per MD, ok to use.

## 2014-09-12 NOTE — Significant Event (Signed)
Hypoglycemic Event  CBG: 46  Treatment: 15 GM gel   Symptoms: None  Patient was asymptomatic. CBG was 46 upon 0000 q4h CBG monitoring.  Follow-up CBG: Time:0035 CBG Result:88  Possible Reasons for Event: Other: Lantus 30 units AM dose; eating 30% of dinner   Comments/MD notified: Plotnikov, MD on call. MD said to hold AM dose of Lantus.     Candace Cruise K  Remember to initiate Hypoglycemia Order Set & complete

## 2014-09-12 NOTE — Progress Notes (Signed)
Patient called out for pain medication for right neck and to be changed. Patient was changed and repositioned; sat high to swallow 50mg  Tramadol and 500mg  Robaxin.  After taking medication patient complained of chest pain and held under left breast, saying pain was "crushing" and again complaining of right neck pain.  Patient then used words such as "muscle cramp" to describe chest pain.  PRN 2L O2 [place] donned to patient.  Vitals stable: temp-97.7, BP-137/60, pulse-66, resp-16, O2-100% 2LNC.  On call MD made aware of situation, and following orders received: EKG 12 lead, Nitroglycerin SL tab 0.4mg , troponin 1, Oxycodone 5mg  q12h prn for 2 doses.  Patient given Nitro SL tab, EKG performed, results printed and in patient chart. Patient finding relief from Tramadol, Robaxin, Nitro.  Will continue to monitor.

## 2014-09-12 NOTE — Progress Notes (Signed)
SLP Cancellation Note  Patient Details Name: Christy Simmons MRN: [id number] DOB: 1934-12-13   Cancelled treatment:        Patient missed 45 minutes of skilled SLP services due to patient's pain and fatigue due to recently administered pain medication. SLP briefly educated family and RN on need to hold PO with current level of arousal and ensure patient is awake and alert before next meal.                                                                                              Gunnar Fusi, M.A., CCC-SLP [phone]  [place] 09/12/2014, 10:04 AM

## 2014-09-12 NOTE — Progress Notes (Signed)
Occupational Therapy Session Note  Patient Details  Name: Christy Simmons MRN: [id number] Date of Birth: 06-16-1935  Today's Date: 09/12/2014 OT Individual Time: 1105-1205 OT Individual Time Calculation (min): 60 min    Short Term Goals: Week 1:  OT Short Term Goal 1 (Week 1): Pt will engaged in 15 minutes of B & D session before taking rest break secondary to fatigue. OT Short Term Goal 2 (Week 1): Pt will perform grooming with Min A in order to increase I with self care. OT Short Term Goal 3 (Week 1): Pt will perform LB dressing with Mod A in order to increase I in self care.  OT Short Term Goal 4 (Week 1): Pt will perform Mod A shower transfer in order to increase I in self care.  Skilled Therapeutic Interventions/Progress Updates:    Engaged in ADL retraining with focus on initiation, participation in treatment session, OOB tolerance, and functional transfers. Pt in bed upon arrival, willing to participate in treatment session.  Max assist bed mobility and encouragement to participate in mobility.  Pt wincing with bed mobility but no c/o pain.  Stand step transfer bed > w/c with max assist and increased time for initiation.  Pt completed UB bathing seated at sink.  Reported need to toilet, stand pivot transfer to/from toilet with max assist, focus on upright sitting while on toilet as pt tends to demonstrate flexed posture in sitting.  Total assist with hygiene and LB dressing.  Pt requested water, therefore engaged in oral hygiene and therapist provided supervision/cues while drinking small sips of thin water.  Pt encouraged to remain seated in w/c through lunch until PT session.  Therapy Documentation Precautions:  Precautions Precautions: Fall Precaution Comments: L hemiparesis, ataxia Restrictions Weight Bearing Restrictions: No Pain: Pain Assessment Faces Pain Scale: Hurts little more  See FIM for current functional status  Therapy/Group: Individual Therapy  Simonne Come 09/12/2014, 12:28 PM

## 2014-09-12 NOTE — Plan of Care (Signed)
Problem: RH BOWEL ELIMINATION Goal: RH STG MANAGE BOWEL WITH ASSISTANCE STG Manage Bowel with Mod Assistance.  Outcome: Not Progressing Last BM 2/6

## 2014-09-13 ENCOUNTER — Inpatient Hospital Stay (HOSPITAL_COMMUNITY): Payer: Medicare Other

## 2014-09-13 ENCOUNTER — Encounter (HOSPITAL_COMMUNITY): Payer: Self-pay | Admitting: Internal Medicine

## 2014-09-13 ENCOUNTER — Observation Stay (HOSPITAL_COMMUNITY)
Admission: AD | Admit: 2014-09-13 | Discharge: 2014-09-16 | Disposition: A | Discharge status: Skilled Nursing Facility | Payer: Medicare Other | Source: Ambulatory Visit | Attending: Internal Medicine | Admitting: Internal Medicine

## 2014-09-13 DIAGNOSIS — Z888 Allergy status to other drugs, medicaments and biological substances status: Secondary | ICD-10-CM | POA: Diagnosis not present

## 2014-09-13 DIAGNOSIS — E1169 Type 2 diabetes mellitus with other specified complication: Secondary | ICD-10-CM | POA: Diagnosis not present

## 2014-09-13 DIAGNOSIS — I69354 Hemiplegia and hemiparesis following cerebral infarction affecting left non-dominant side: Secondary | ICD-10-CM | POA: Insufficient documentation

## 2014-09-13 DIAGNOSIS — K219 Gastro-esophageal reflux disease without esophagitis: Secondary | ICD-10-CM | POA: Diagnosis present

## 2014-09-13 DIAGNOSIS — Z79899 Other long term (current) drug therapy: Secondary | ICD-10-CM | POA: Diagnosis not present

## 2014-09-13 DIAGNOSIS — I10 Essential (primary) hypertension: Secondary | ICD-10-CM | POA: Diagnosis present

## 2014-09-13 DIAGNOSIS — E440 Moderate protein-calorie malnutrition: Secondary | ICD-10-CM | POA: Insufficient documentation

## 2014-09-13 DIAGNOSIS — I82403 Acute embolism and thrombosis of unspecified deep veins of lower extremity, bilateral: Secondary | ICD-10-CM

## 2014-09-13 DIAGNOSIS — E785 Hyperlipidemia, unspecified: Secondary | ICD-10-CM | POA: Diagnosis present

## 2014-09-13 DIAGNOSIS — I82442 Acute embolism and thrombosis of left tibial vein: Secondary | ICD-10-CM | POA: Diagnosis not present

## 2014-09-13 DIAGNOSIS — Q211 Atrial septal defect: Secondary | ICD-10-CM | POA: Diagnosis not present

## 2014-09-13 DIAGNOSIS — Z794 Long term (current) use of insulin: Secondary | ICD-10-CM | POA: Insufficient documentation

## 2014-09-13 DIAGNOSIS — I619 Nontraumatic intracerebral hemorrhage, unspecified: Secondary | ICD-10-CM

## 2014-09-13 DIAGNOSIS — Z885 Allergy status to narcotic agent status: Secondary | ICD-10-CM | POA: Diagnosis not present

## 2014-09-13 DIAGNOSIS — E119 Type 2 diabetes mellitus without complications: Secondary | ICD-10-CM

## 2014-09-13 DIAGNOSIS — E1142 Type 2 diabetes mellitus with diabetic polyneuropathy: Secondary | ICD-10-CM | POA: Insufficient documentation

## 2014-09-13 DIAGNOSIS — Z7982 Long term (current) use of aspirin: Secondary | ICD-10-CM | POA: Insufficient documentation

## 2014-09-13 DIAGNOSIS — R079 Chest pain, unspecified: Principal | ICD-10-CM | POA: Insufficient documentation

## 2014-09-13 DIAGNOSIS — Z8673 Personal history of transient ischemic attack (TIA), and cerebral infarction without residual deficits: Secondary | ICD-10-CM | POA: Insufficient documentation

## 2014-09-13 DIAGNOSIS — I82431 Acute embolism and thrombosis of right popliteal vein: Secondary | ICD-10-CM | POA: Insufficient documentation

## 2014-09-13 DIAGNOSIS — H269 Unspecified cataract: Secondary | ICD-10-CM | POA: Insufficient documentation

## 2014-09-13 DIAGNOSIS — B379 Candidiasis, unspecified: Secondary | ICD-10-CM | POA: Diagnosis not present

## 2014-09-13 DIAGNOSIS — I610 Nontraumatic intracerebral hemorrhage in hemisphere, subcortical: Secondary | ICD-10-CM

## 2014-09-13 HISTORY — DX: Acute embolism and thrombosis of unspecified deep veins of unspecified lower extremity: I82.409

## 2014-09-13 HISTORY — DX: Migraine, unspecified, not intractable, without status migrainosus: G43.909

## 2014-09-13 HISTORY — DX: Nontoxic single thyroid nodule: E04.1

## 2014-09-13 HISTORY — DX: Type 2 diabetes mellitus without complications: E11.9

## 2014-09-13 LAB — D-DIMER, QUANTITATIVE (NOT AT ARMC)

## 2014-09-13 LAB — CBC WITH DIFFERENTIAL/PLATELET
BASOS ABS: 0 10*3/uL (ref 0.0–0.1)
BASOS PCT: 1 % (ref 0–1)
Eosinophils Absolute: 0.1 10*3/uL (ref 0.0–0.7)
Eosinophils Relative: 1 % (ref 0–5)
HEMATOCRIT: 28.1 % — AB (ref 36.0–46.0)
Hemoglobin: 9.3 g/dL — ABNORMAL LOW (ref 12.0–15.0)
Lymphocytes Relative: 22 % (ref 12–46)
Lymphs Abs: 1.9 10*3/uL (ref 0.7–4.0)
MCH: 28.7 pg (ref 26.0–34.0)
MCHC: 33.1 g/dL (ref 30.0–36.0)
MCV: 86.7 fL (ref 78.0–100.0)
MONO ABS: 0.9 10*3/uL (ref 0.1–1.0)
Monocytes Relative: 10 % (ref 3–12)
Neutro Abs: 5.7 10*3/uL (ref 1.7–7.7)
Neutrophils Relative %: 66 % (ref 43–77)
Platelets: 187 10*3/uL (ref 150–400)
RBC: 3.24 MIL/uL — ABNORMAL LOW (ref 3.87–5.11)
RDW: 13.6 % (ref 11.5–15.5)
WBC: 8.6 10*3/uL (ref 4.0–10.5)

## 2014-09-13 LAB — GLUCOSE, CAPILLARY
GLUCOSE-CAPILLARY: 122 mg/dL — AB (ref 70–99)
Glucose-Capillary: 111 mg/dL — ABNORMAL HIGH (ref 70–99)
Glucose-Capillary: 128 mg/dL — ABNORMAL HIGH (ref 70–99)
Glucose-Capillary: 71 mg/dL (ref 70–99)
Glucose-Capillary: 92 mg/dL (ref 70–99)

## 2014-09-13 LAB — BASIC METABOLIC PANEL
Anion gap: 8 (ref 5–15)
BUN: 12 mg/dL (ref 6–23)
CO2: 22 mmol/L (ref 19–32)
CREATININE: 0.77 mg/dL (ref 0.50–1.10)
Calcium: 9.2 mg/dL (ref 8.4–10.5)
Chloride: 106 mmol/L (ref 96–112)
GFR calc Af Amer: >90 mL/min (ref >90)
GFR, EST NON AFRICAN AMERICAN: 78 mL/min — AB (ref >90)
Glucose, Bld: 142 mg/dL — ABNORMAL HIGH (ref 70–99)
Potassium: 4.5 mmol/L (ref 3.5–5.1)
Sodium: 136 mmol/L (ref 135–145)

## 2014-09-13 LAB — CK TOTAL AND CKMB (NOT AT ARMC)
CK TOTAL: 19 U/L (ref 7–177)
CK, MB: 1.3 ng/mL (ref 0.3–4.0)
RELATIVE INDEX: INVALID (ref 0.0–2.5)

## 2014-09-13 LAB — TSH: TSH: 1.758 u[IU]/mL (ref 0.350–4.500)

## 2014-09-13 MED ORDER — MORPHINE SULFATE 2 MG/ML IJ SOLN
2.0000 mg | INTRAMUSCULAR | Status: DC | PRN
  Administered 2014-09-13 – 2014-09-15 (×6): 2 mg via INTRAVENOUS
  Filled 2014-09-13 (×6): qty 1

## 2014-09-13 MED ORDER — SODIUM CHLORIDE 0.9 % IV SOLN
INTRAVENOUS | Status: DC
  Administered 2014-09-13 – 2014-09-15 (×3): via INTRAVENOUS

## 2014-09-13 MED ORDER — CALCIUM CARBONATE-VITAMIN D 500-200 MG-UNIT PO TABS
1.0000 | ORAL_TABLET | Freq: Every day | ORAL | Status: DC
  Administered 2014-09-14 – 2014-09-16 (×3): 1 via ORAL
  Filled 2014-09-13 (×4): qty 1

## 2014-09-13 MED ORDER — VITAMIN D3 25 MCG (1000 UT) PO CAPS: 1000.0000 [IU] | ORAL_CAPSULE | Freq: Every day | ORAL | Status: DC

## 2014-09-13 MED ORDER — ONDANSETRON HCL 4 MG/2ML IJ SOLN
4.0000 mg | Freq: Four times a day (QID) | INTRAMUSCULAR | Status: DC | PRN
  Administered 2014-09-14: 4 mg via INTRAVENOUS
  Filled 2014-09-13: qty 2

## 2014-09-13 MED ORDER — RESOURCE THICKENUP CLEAR PO POWD
ORAL | Status: DC | PRN
  Filled 2014-09-13: qty 125

## 2014-09-13 MED ORDER — HYDROMORPHONE HCL 1 MG/ML IJ SOLN
1.0000 mg | Freq: Once | INTRAMUSCULAR | Status: AC
  Administered 2014-09-13: 1 mg via INTRAVENOUS
  Filled 2014-09-13: qty 1

## 2014-09-13 MED ORDER — METOPROLOL TARTRATE 12.5 MG HALF TABLET
12.5000 mg | ORAL_TABLET | Freq: Two times a day (BID) | ORAL | Status: DC
  Administered 2014-09-13 – 2014-09-15 (×5): 12.5 mg via ORAL
  Filled 2014-09-13 (×7): qty 1

## 2014-09-13 MED ORDER — OXYCODONE-ACETAMINOPHEN 5-325 MG PO TABS
1.0000 | ORAL_TABLET | Freq: Four times a day (QID) | ORAL | Status: DC | PRN
  Administered 2014-09-14 – 2014-09-16 (×4): 2 via ORAL
  Filled 2014-09-13 (×4): qty 2

## 2014-09-13 MED ORDER — CALCIUM CITRATE-VITAMIN D 250-200 MG-UNIT PO TABS: 1.0000 | ORAL_TABLET | Freq: Every day | ORAL | Status: DC

## 2014-09-13 MED ORDER — BRIMONIDINE TARTRATE 0.2 % OP SOLN
1.0000 [drp] | Freq: Two times a day (BID) | OPHTHALMIC | Status: DC
  Administered 2014-09-13 – 2014-09-16 (×6): 1 [drp] via OPHTHALMIC
  Filled 2014-09-13: qty 5

## 2014-09-13 MED ORDER — GI COCKTAIL ~~LOC~~
30.0000 mL | Freq: Four times a day (QID) | ORAL | Status: DC | PRN
  Filled 2014-09-13: qty 30

## 2014-09-13 MED ORDER — HYDROCODONE-ACETAMINOPHEN 5-325 MG PO TABS
1.0000 | ORAL_TABLET | ORAL | Status: DC | PRN
  Administered 2014-09-13: 1 via ORAL
  Filled 2014-09-13: qty 1

## 2014-09-13 MED ORDER — ACETAMINOPHEN 325 MG PO TABS
650.0000 mg | ORAL_TABLET | ORAL | Status: DC | PRN
  Administered 2014-09-15: 650 mg via ORAL
  Filled 2014-09-13: qty 2

## 2014-09-13 MED ORDER — GLUCERNA 1.2 CAL PO LIQD
237.0000 mL | Freq: Three times a day (TID) | ORAL | Status: DC
  Administered 2014-09-14: 237 mL via ORAL
  Filled 2014-09-13 (×6): qty 237

## 2014-09-13 MED ORDER — ADULT MULTIVITAMIN W/MINERALS CH
1.0000 | ORAL_TABLET | Freq: Every day | ORAL | Status: DC
  Administered 2014-09-14 – 2014-09-16 (×3): 1 via ORAL
  Filled 2014-09-13 (×3): qty 1

## 2014-09-13 MED ORDER — MAGNESIUM OXIDE 400 (241.3 MG) MG PO TABS
400.0000 mg | ORAL_TABLET | Freq: Every day | ORAL | Status: DC
  Administered 2014-09-14 – 2014-09-16 (×3): 400 mg via ORAL
  Filled 2014-09-13 (×3): qty 1

## 2014-09-13 MED ORDER — DIAZEPAM 5 MG PO TABS
5.0000 mg | ORAL_TABLET | Freq: Three times a day (TID) | ORAL | Status: DC | PRN
  Administered 2014-09-14 – 2014-09-15 (×2): 5 mg via ORAL
  Filled 2014-09-13 (×2): qty 1

## 2014-09-13 MED ORDER — NITROGLYCERIN 0.4 MG/SPRAY TL SOLN
1.0000 | Status: DC | PRN
  Filled 2014-09-13: qty 4.9

## 2014-09-13 MED ORDER — INSULIN GLARGINE 100 UNIT/ML ~~LOC~~ SOLN
6.0000 [IU] | Freq: Every day | SUBCUTANEOUS | Status: DC
  Filled 2014-09-13: qty 0.06

## 2014-09-13 MED ORDER — BISACODYL 10 MG RE SUPP: 10.0000 mg | Freq: Once | RECTAL | Status: DC

## 2014-09-13 MED ORDER — INSULIN GLARGINE 100 UNIT/ML ~~LOC~~ SOLN
10.0000 [IU] | Freq: Every day | SUBCUTANEOUS | Status: DC
  Administered 2014-09-14 – 2014-09-15 (×2): 10 [IU] via SUBCUTANEOUS
  Filled 2014-09-13 (×3): qty 0.1

## 2014-09-13 MED ORDER — NITROGLYCERIN 0.4 MG SL SUBL
SUBLINGUAL_TABLET | SUBLINGUAL | Status: AC
  Administered 2014-09-13: 0.4 mg
  Filled 2014-09-13: qty 1

## 2014-09-13 MED ORDER — MAGNESIUM 200 MG PO TABS: 400.0000 mg | ORAL_TABLET | Freq: Every day | ORAL | Status: DC

## 2014-09-13 MED ORDER — ASPIRIN EC 81 MG PO TBEC
81.0000 mg | DELAYED_RELEASE_TABLET | Freq: Every day | ORAL | Status: DC
  Administered 2014-09-14 – 2014-09-16 (×3): 81 mg via ORAL
  Filled 2014-09-13 (×3): qty 1

## 2014-09-13 MED ORDER — PANTOPRAZOLE SODIUM 40 MG PO TBEC
40.0000 mg | DELAYED_RELEASE_TABLET | Freq: Two times a day (BID) | ORAL | Status: DC
  Administered 2014-09-13 – 2014-09-16 (×6): 40 mg via ORAL
  Filled 2014-09-13 (×6): qty 1

## 2014-09-13 MED ORDER — VITAMIN D3 25 MCG (1000 UNIT) PO TABS
1000.0000 [IU] | ORAL_TABLET | Freq: Every day | ORAL | Status: DC
  Administered 2014-09-14 – 2014-09-16 (×3): 1000 [IU] via ORAL
  Filled 2014-09-13 (×3): qty 1

## 2014-09-13 MED ORDER — ATORVASTATIN CALCIUM 20 MG PO TABS
20.0000 mg | ORAL_TABLET | Freq: Every day | ORAL | Status: DC
  Administered 2014-09-14 – 2014-09-15 (×2): 20 mg via ORAL
  Filled 2014-09-13 (×3): qty 1

## 2014-09-13 NOTE — Progress Notes (Signed)
Christy Simmons is a 80 y.o. female 05/11/1935 [id number]  Subjective: On call - 5:05 pm C/o L CP 6/10 for 1+ hr, not relieved w/NTG  Objective: Vital signs in last 24 hours: Temp:  [98.1 F (36.7 C)] 98.1 F (36.7 C) (02/14 0546) Pulse Rate:  [68-92] 87 (02/14 1700) Resp:  [17] 17 (02/14 0546) BP: (109-159)/(61-72) 132/62 mmHg (02/14 1700) SpO2:  [96 %-98 %] 96 % (02/14 1700) Weight change:  Last BM Date: 09/05/14  Intake/Output from previous day: 02/13 0701 - 02/14 0700 In: 120 [P.O.:120] Out: -  Last cbgs: CBG (last 3)   Recent Labs  09/13/14 0737 09/13/14 1147 09/13/14 1611  GLUCAP 71 111* 122*     Physical Exam General: Pt is in mild apparent distress due to pain - shaking HEENT: not dry Lungs: Normal effort. Lungs clear to auscultation, no crackles or wheezes. Cardiovascular: Regular rate and rhythm, no edema Abdomen: S/NT/ND; BS(+) Musculoskeletal: L chest wall is sensitive to palpation Neurological: No new neurological deficits Wounds: N/A    Skin: clear  Aging changes Mental state: Alert, oriented, cooperative    Lab Results: BMET    Component Value Date/Time   NA 138 09/11/2014 0615   K 4.3 09/11/2014 0615   CL 109 09/11/2014 0615   CO2 22 09/11/2014 0615   GLUCOSE 111* 09/11/2014 0615   BUN 11 09/11/2014 0615   CREATININE 0.69 09/11/2014 0615   CALCIUM 8.8 09/11/2014 0615   GFRNONAA 81* 09/11/2014 0615   GFRAA >90 09/11/2014 0615   CBC    Component Value Date/Time   WBC 7.6 09/11/2014 0615   RBC 3.20* 09/11/2014 0615   HGB 9.4* 09/11/2014 0615   HCT 27.9* 09/11/2014 0615   PLT 211 09/11/2014 0615   MCV 87.2 09/11/2014 0615   MCH 29.4 09/11/2014 0615   MCHC 33.7 09/11/2014 0615   RDW 13.4 09/11/2014 0615   LYMPHSABS 1.6 09/11/2014 0615   MONOABS 0.8 09/11/2014 0615   EOSABS 0.2 09/11/2014 0615   BASOSABS 0.0 09/11/2014 0615   EKG ---WNL  Studies/Results: No results found.  Medications: I have reviewed the patient's current  medications.  Assessment/Plan:  Acute Left-sided CP of ?etiology. R/o angina, PE, etc. NTG, Dilaudid, Zofran Labs, CXR EKG  We will need to go to telemetry - 2H-26  Dr Anson Fret help is very appreciated!  1. 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 DVT Prophylaxis/Anticoagulation: Nonocclusive DVT right popliteal vein and DVT left posterior tibial vein. Status post IVC filter 09/09/2014 per interventional radiology 3. Pain Management: Ultram 50-100 mg twice a day as needed and Robaxin. Monitor with increased mobility 4. Dysphagia. Dysphagia 1 honey liquids. Monitor hydration with IV fluids 5. Anterior chest wall pain- resolved  6. Hypertension. No current antihypertensive medication. Patient on lisinopril-hydrochlorothiazide 20-25 milligrams daily prior to admission. Resume as needed- remains stable off meds  7. Diabetes mellitus with peripheral neuropathy. Hemoglobin A1c of 12. Lantus insulin decreased to 10 units yesterday; will further down titrate. Check blood sugars before meals and at bedtime. Diabetic teaching 8. H/o gall bladder disease    Length of stay, days: 3  Walker Kehr , MD 09/13/2014, 5:12 PM

## 2014-09-13 NOTE — H&P (Addendum)
[facility] History and Physical  KEMARIA DEDIC MHD:[id number] DOB: March 23, 1935 DOA: 09/13/2014  Referring physician: Dr. Mauricio Po  PCP: Leonides Sake, MD   Chief Complaint: chest pain  HPI: MERCADEZ HEITMAN is a 80 y.o. female with PMH significant for HTN, HLD, diabetes, hx of PFO and atrial aneurysm; who was recently admitted secondary to acute right side stroke with conversion to [place] with revascularization. Patient was at inpatient rehab when suddenly developed excruciating pain in the left side of her chest. Pain has been present for approx 2.5 hours, mainly in her epigastric region and radiated to left rib cage; no sweating, no nausea, no vomiting and SOB. Patient received NTG X 2 w/o relieved. [facility] called to see patient and admit her for further evaluation and treatment.   Review of Systems:  Negative except as otherwise mentioned in HPI   Past Medical History  Diagnosis Date  . Transient ischemic attack   . HTN (hypertension)   . Hyperlipidemia   . DM (diabetes mellitus)   . GERD (gastroesophageal reflux disease)   . Heart disease     PFO  . Amnesia 01/10/2013  . Patent foramen ovale with atrial septal aneurysm   . Complication of anesthesia   . PONV (postoperative nausea and vomiting)   . Family history of anesthesia complication     "My brother has trouble waking up."  . Glaucoma     Hx: of  . Early cataracts, bilateral     Hx: of  . Arthritis   . Stroke    Past Surgical History  Procedure Laterality Date  . US echocardiography  10-13-11    EF 60-65% normal  . Back surgery      disc   . Tonsillectomy    . Tee without cardioversion  10/30/2011    Procedure: TRANSESOPHAGEAL ECHOCARDIOGRAM (TEE);  Surgeon: Larey Dresser, MD;  Location: [facility];  Service: Cardiovascular;  Laterality: N/A;  . Colonoscopy w/ biopsies and polypectomy      HX;of  . Tonsillectomy    . Tubal ligation    . Lumbar laminectomy/decompression microdiscectomy N/A 09/11/2013   Procedure: LUMBAR LAMINECTOMY/DECOMPRESSION MICRODISCECTOMY 2 LEVELS;  Surgeon: Ophelia Charter, MD;  Location: [facility];  Service: Neurosurgery;  Laterality: N/A;  Lumbar Four-Five Laminectomy and Foraminotomy with redo Lumbar Five-Sacral One diskectomy  . Radiology with anesthesia N/A 09/05/2014    Procedure: RADIOLOGY WITH ANESTHESIA;  Surgeon: Rob Hickman, MD;  Location: [facility];  Service: Radiology;  Laterality: N/A;   Social History:  reports that she has never smoked. She has never used smokeless tobacco. She reports that she does not drink alcohol or use illicit drugs.  Allergies  Allergen Reactions  . Gabapentin Other (See Comments)    Caused depression  . Codeine     Headache    Family History  Problem Relation Age of Onset  . Stroke      No family history of such  . Congenital heart disease      No family history of such  . Anesthesia problems Neg Hx   . Diabetes Maternal Grandfather     Prior to Admission medications   Medication Sig Start Date End Date Taking? Authorizing Provider  aspirin EC 81 MG tablet Take 81 mg by mouth daily.    Historical Provider, MD  brimonidine (ALPHAGAN) 0.2 % ophthalmic solution Place 1 drop into both eyes 2 (two) times daily.  11/18/12   Historical Provider, MD  Calcium Citrate-Vitamin D 250-200 MG-UNIT  TABS Take 1 tablet by mouth daily.    Historical Provider, MD  Cholecalciferol (VITAMIN D3) 1000 UNITS CAPS Take 1,000 Units by mouth daily.    Historical Provider, MD  diazepam (VALIUM) 5 MG tablet Take 1 tablet (5 mg total) by mouth every 6 (six) hours as needed for muscle spasms. Patient not taking: Reported on 09/05/2014 09/13/13   Consuella Lose, MD  esomeprazole (NEXIUM) 20 MG capsule Take 20 mg by mouth daily.     Historical Provider, MD  ibuprofen (ADVIL,MOTRIN) 200 MG tablet Take 200 mg by mouth daily as needed for mild pain.     Historical Provider, MD  insulin glargine (LANTUS) 100 UNIT/ML injection Inject 20 Units into  the skin daily.     Historical Provider, MD  insulin lispro (HUMALOG) 100 UNIT/ML injection Inject into the skin 3 (three) times daily before meals. Per sliding scale - average 15 units    Historical Provider, MD  lisinopril-hydrochlorothiazide (PRINZIDE,ZESTORETIC) 20-25 MG per tablet Take 1 tablet by mouth daily.    Historical Provider, MD  Magnesium 200 MG TABS Take 400 mg by mouth daily.    Historical Provider, MD  metoCLOPramide (REGLAN) 5 MG tablet Take 5 mg by mouth 4 (four) times daily -  before meals and at bedtime.     Historical Provider, MD  Misc Natural Products (OSTEO BI-FLEX ADV JOINT SHIELD) TABS Take 1 tablet by mouth daily.    Historical Provider, MD  Multiple Vitamin (MULITIVITAMIN WITH MINERALS) TABS Take 1 tablet by mouth daily.    Historical Provider, MD  oxyCODONE-acetaminophen (PERCOCET/ROXICET) 5-325 MG per tablet Take 1-2 tablets by mouth every 4 (four) hours as needed for moderate pain. Patient not taking: Reported on 09/05/2014 09/13/13   Consuella Lose, MD  traMADol (ULTRAM) 50 MG tablet Take 50-100 mg by mouth 2 (two) times daily as needed for moderate pain.  11/20/13   Historical Provider, MD   Physical Exam: Filed Vitals:   09/13/14 1830 09/13/14 1845 09/13/14 1900 09/13/14 1944  BP:   127/62   Pulse: 65 66 66 66  Temp:    97.9 F (36.6 C)  TempSrc:    Oral  Resp: 9 7 8 9   Height:      Weight:      SpO2: 96% 94% 96% 95%    Wt Readings from Last 3 Encounters:  09/13/14 57.5 kg (126 lb 12.2 oz)  09/10/14 55.838 kg (123 lb 1.6 oz)  09/06/14 55.5 kg (122 lb 5.7 oz)    General:  Appears calm and reports feeling more comfortable; no nausea, no vomiting, no fever, no coughing and no sweating/diaphoresis. Eyes: PERRL, normal lids, irises & conjunctiva; no nystagmus, no icterus ENT: grossly normal hearing, lips & tongue; no erythema or exudates inside her mouth; mild thrush appreciated Neck: no LAD, masses or thyromegaly; no JVD Cardiovascular: RRR, no m/r/g.  No LE edema. Telemetry: SR, no arrhythmias  Respiratory: CTA bilaterally, no w/r/r. Normal respiratory effort. Abdomen: soft, nd; positive BS. Patient with discomfort when palpating epigastric region Skin: no rash, open wounds, petechiae or induration seen on exam Musculoskeletal: no joint swelling, no erythema or tenderness in big joints Psychiatric: grossly normal mood and affect, speech fluent and appropriate Neurologic: no new focal deficit. Patient with left hemiparesis; no dysarthria. AAOX3 and able to follow commands and expressed herself properly.          Labs on Admission:  Basic Metabolic Panel:  Recent Labs Lab 09/11/14 0615 09/13/14 1846  NA 138  136  K 4.3 4.5  CL 109 106  CO2 22 22  GLUCOSE 111* 142*  BUN 11 12  CREATININE 0.69 0.77  CALCIUM 8.8 9.2   Liver Function Tests:  Recent Labs Lab 09/11/14 0615  AST 28  ALT 15  ALKPHOS 59  BILITOT 0.8  PROT 4.6*  ALBUMIN 2.0*   CBC:  Recent Labs Lab 09/11/14 0615 09/13/14 1846  WBC 7.6 8.6  NEUTROABS 5.0 5.7  HGB 9.4* 9.3*  HCT 27.9* 28.1*  MCV 87.2 86.7  PLT 211 187   Cardiac Enzymes:  Recent Labs Lab 09/12/14 0405 09/13/14 1846  CKTOTAL  --  19  CKMB  --  1.3  TROPONINI 0.08*  --      CBG:  Recent Labs Lab 09/13/14 0009 09/13/14 0412 09/13/14 0737 09/13/14 1147 09/13/14 1611  GLUCAP 92 128* 71 111* 122*    Radiological Exams on Admission: Dg Chest 2 View  09/13/2014   CLINICAL DATA:  Chest pain  EXAM: CHEST  2 VIEW  COMPARISON:  September 05, 2014  FINDINGS: Endotracheal tube no longer present. No pneumothorax. Lungs are clear. Heart size and pulmonary vascularity are normal. No adenopathy. There is evidence of a prior fracture of the left clavicle, healed. No acute rib fracture or other rib lesion is identified on this study. Bones do appear somewhat osteoporotic.  IMPRESSION: No edema or consolidation. No pneumothorax. Healed rib fracture left clavicle. No acute fracture elsewhere.    Electronically Signed   By: Lowella Grip III M.D.   On: 09/13/2014 21:27    EKG:  Sinus rhythm, no acute ischemic changes appreciated   Assessment/Plan 1-Chest pain: patient complaining of anterior chest wall for the last 2 days; on my exam pain was 3/10 and mainly localized in her epigastric area and left rib cage. Denies SOB, diaphoresis or palpitations. She reports extensive physical therapy session today. -of note, patient received 2 NTG tablets w/o relieved in her pain. -patient admitted to stepdown due to active pain -will cycle cardiac enzymes -cycle EKG -PRN NTG for CP -will start metoprolol, statins and continue aspirin  -patient with recent 2-D echo demonstrating no wall motion abnormalities and preserved EF -she has positive bilateral DVT and D-dimer was ordered and elevated > 20; but she is status post IVC filter placement and is not complaining of SOB or tachycardia; also due to recent [place] unable to use anticoagulation. Will hold on CTA at this moment -will follow troponin and depending results will discuss with cardiology -pain is atypical; but patient with significant risk factors. -will start patient on protonix BID and continue robaxin  2-Malnutrition of moderate degree: will use glucerna TID -dietician was actively following patient  3-patient with recent right stroke of basal ganglia: who during revascularization transform into ICH (intracerebral hemorrhage) at the putamen. -per neurology ok to continue ASA for now and in 5 days (2/16-2/17) repeat CT head; if bleeding subsided plan would be for eliquis 2.5mg  BID -patient with positive PFO and bilateral DVT  4-Essential hypertension: stable. Will use metoprolol  5-uncontrolled Diabetes: A1C 12 -will start SSI -continue lantus -use modified carbohydrates diet  6-HLD (hyperlipidemia): continue statins  7-GERD (gastroesophageal reflux disease): on active treatment with PPI  8-DVT of lower extremity,  bilateral: s/p IVC filter due to inability for anticoagulation with recent [place]  9-left hemiparesis/dysphagia: residual effect from CVA -continue dysphagia 1 and honey thick liquids -was actively receiving rehabilitation prior to admission; once CP r/o will resume rehab   10-thrush: will  treat with nystatin   Code Status: DNR DVT Prophylaxis:TED hose (patient with recent ICH, no heparin products; also with bilateral DVT, so no SCD's) Family Communication: no family at bedside Disposition Plan: LOS >2 midnights, inpatient; stepdown bed.  Time spent: 50 minutes  Barton Dubois [facility] Pager [phone]

## 2014-09-13 NOTE — Progress Notes (Signed)
RN called to see patient in room for chest pain. She was c/o of pain in her legs prior and robaxin and vicodin was given at 1533 and 1603. Pain was a 5/10. Vitals taken and HR was 150's and dropped quickly  to 70's. See flowsheet. EKG taken. Rapid response called to see patient. Spoke to Dr. Alain Marion. Nitroglycerin given at 1642 x 1 . Second dose given per MD order at 1650. Dr. Alain Marion at bedside. Patient still c/o of pain 4/10 on her chest and legs. Consulted allergy to codeine with MD, per MD ok to administer. Dilaudid given 1mg  IV and zofran 4mg  IV. Order to transfer patient to stepdown ICU. Report given to Lucina Mellow. Husband was at the bedside.

## 2014-09-13 NOTE — Significant Event (Signed)
Rapid Response Event Note  Overview:    Called to see patient at 1635 for complaints of chest pain.  Initial Focused Assessment: Upon arrival, patient is pale, responsive, reports 5/10 chest pain and leg pain. BP/HR/O2 Sat taken. No other distress reported. See flow sheet. History reviewed.   Interventions: 12 Lead EKG taken. One SL nitroglycerin tablet admin at 1640. BP, HR, O2 Sats monitored. Pt expereinced a drop in pain to 3/10, with subsequent rise and stabilization at 4/10. MD paged.  Upon arrival and review of EKG, MD ordered 2nd SL Nitroglycerin tablet. Administered and monitored vital signs. Patient also receive dilaudid.  Will continue to monitor and assess as needed.   Event Summary:   at      at          Baron Hamper

## 2014-09-13 NOTE — Progress Notes (Signed)
Physical Therapy Session Note  Patient Details  Name: Christy Simmons MRN: [id number] Date of Birth: 1934/08/29  Today's Date: 09/13/2014 PT Individual Time: 0900-1000 PT Individual Time Calculation (min): 60 min   Session 2 Time: 1400-1430 Time Calculation (min): 30 min  Short Term Goals: Week 1:  PT Short Term Goal 1 (Week 1): Pt will perform sit<>stand at mod A level PT Short Term Goal 2 (Week 1): Pt will perform functional transfers consistently at mod A level PT Short Term Goal 3 (Week 1): Pt will perform dynamic standing activity for 3-5 mins at mod A level PT Short Term Goal 4 (Week 1): Pt will self propel w/c x 75' using BUEs/LEs (by whatever means possible) at min A level PT Short Term Goal 5 (Week 1): Pt will ambulate x 25' w/ LRAD at max A of single therapist.   Skilled Therapeutic Interventions/Progress Updates:    Session 1: Pt received supine in bed, agreeable to participate in therapy. Session focused on transfers, sitting/standing balance, bed mobility, w/c propulsion and functional endurance. Instructed pt in moving supine>sit w/ ModA for bringing trunk up to sitting. Instructed pt in transferring bed>w/c and w/c<>mat table w/ squat pivot technique w/ overall Mod-MaxA. Instructed pt in w/c propulsion initially w/ BLE and then RUE/RLE w/ SBA, pt limited by decreased endurance and required mod cueing for navigating environment and avoiding obstacles on L. Instructed pt in standing static balance task w/ graded support from therapist CGA-ModA to remain standing. Pt left seated in w/c w/ quick release belt on and family present w/ all needs within reach.   Session 2: Pt received supine in bed, agreeable to participate in therapy. Session focused on gait training, sit<>stands. Instructed pt in moving supine>sit w/ HOB elevated and overall ModA w/ assist for bringing trunk up to sitting. Transferred bed>w/c w/ squat pivot and ModA w/ mod cueing for sequencing and hand placement. In rehab  gym instructed pt in multiple sit<>stands in parallel bars w/ Mod-MaxA to come to standing and ModA to remain standing. Instructed pt in ambulating 2 steps forward, 2 steps backward, then 8' forward in parallel bars w/ w/c follow. Pt required assist during gait for LLE advancement, weight shift to R and L, upright positioning. Session ended in pt's room, where pt was left seated in w/c w/ quick release belt on and husband present w/ all needs within reach.    Therapy Documentation Precautions:  Precautions Precautions: Fall Precaution Comments: L hemiparesis, ataxia Restrictions Weight Bearing Restrictions: No Pain:  8/10 pain in R neck, applied heat at end of session and provided gentle stretch  See FIM for current functional status  Therapy/Group: Individual Therapy  Rada Hay 09/13/2014, 7:41 AM

## 2014-09-13 NOTE — Progress Notes (Signed)
Patient ID: Christy Simmons, female   DOB: 23-May-1935, 80 y.o.   MRN: [id number]  Patient ID: Christy Simmons, female   DOB: 05-03-1935, 80 y.o.   MRN: [id number]   09/13/13.  80 y.o. right handed female with history of hypertension, diabetes mellitus and peripheral neuropathy, PFO with atrial septal aneurysm maintained on aspirin 81 mg daily.  Presented 09/05/2014 with left-sided weakness and altered mental status. CT of the head show well-defined area of hypodensity around the right basal ganglia.   Underwent complete revascularization of occluded right MCA per interventional radiology. MRI of the brain showed patchy right MCA branch infarct with small hemorrhagic transformation in the right basal ganglia but no significant midline shift. Followed by critical care and extubated without difficulty 09/07/2014.   A venous Doppler study lower extremities completed 09/07/2014 showed findings consistent with nonocclusive DVT right popliteal vein as well as DVT left posterior tibial vein. Interventional radiology consulted for plan of IVC filter and no other anticoagulation at this time due to hemorrhagic infarct and completed 09/09/2014. Modified barium swallow 09/08/2014 recommendations of dysphagia 1 honey thick liquid diet.  Subjective/Complaints: Much more comfortable night.  No further CP.  Lantus decreased last night and FBS still 71 this am.  Main c/o is bilateral leg pain; this has been a problem since back surgery one year ago Review of Systems - Negative except occ cough   Past Medical History  Diagnosis Date  . Transient ischemic attack   . HTN (hypertension)   . Hyperlipidemia   . DM (diabetes mellitus)   . GERD (gastroesophageal reflux disease)   . Heart disease     PFO  . Amnesia 01/10/2013  . Patent foramen ovale with atrial septal aneurysm   . Complication of anesthesia   . PONV (postoperative nausea and vomiting)   . Family history of anesthesia complication     "My brother has trouble  waking up."  . Glaucoma     Hx: of  . Early cataracts, bilateral     Hx: of  . Arthritis   . Stroke      Intake/Output Summary (Last 24 hours) at 09/13/14 0956 Last data filed at 09/12/14 1807  Gross per 24 hour  Intake    120 ml  Output      0 ml  Net    120 ml    Patient Vitals for the past 24 hrs:  BP Temp Temp src Pulse Resp SpO2  09/13/14 0546 109/61 mmHg 98.1 F (36.7 C) Oral 69 17 96 %  09/12/14 1400 (!) 144/66 mmHg 99.1 F (37.3 C) Oral 71 18 97 %    CBG (last 3)   Recent Labs  09/13/14 0009 09/13/14 0412 09/13/14 0737  GLUCAP 92 128* 71    Objective: Vital Signs: Blood pressure 109/61, pulse 69, temperature 98.1 F (36.7 C), temperature source Oral, resp. rate 17, weight 123 lb 1.6 oz (55.838 kg), SpO2 96 %. No results found. Results for orders placed or performed during the hospital encounter of 09/10/14 (from the past 72 hour(s))  Glucose, capillary     Status: Abnormal   Collection Time: 09/10/14  8:25 PM  Result Value Ref Range   Glucose-Capillary 113 (H) 70 - 99 mg/dL  Glucose, capillary     Status: Abnormal   Collection Time: 09/11/14 12:05 AM  Result Value Ref Range   Glucose-Capillary 109 (H) 70 - 99 mg/dL  Glucose, capillary     Status: Abnormal   Collection Time: 09/11/14  4:24 AM  Result Value Ref Range   Glucose-Capillary 109 (H) 70 - 99 mg/dL  CBC WITH DIFFERENTIAL     Status: Abnormal   Collection Time: 09/11/14  6:15 AM  Result Value Ref Range   WBC 7.6 4.0 - 10.5 K/uL   RBC 3.20 (L) 3.87 - 5.11 MIL/uL   Hemoglobin 9.4 (L) 12.0 - 15.0 g/dL   HCT 27.9 (L) 36.0 - 46.0 %   MCV 87.2 78.0 - 100.0 fL   MCH 29.4 26.0 - 34.0 pg   MCHC 33.7 30.0 - 36.0 g/dL   RDW 13.4 11.5 - 15.5 %   Platelets 211 150 - 400 K/uL   Neutrophils Relative % 66 43 - 77 %   Neutro Abs 5.0 1.7 - 7.7 K/uL   Lymphocytes Relative 21 12 - 46 %   Lymphs Abs 1.6 0.7 - 4.0 K/uL   Monocytes Relative 11 3 - 12 %   Monocytes Absolute 0.8 0.1 - 1.0 K/uL    Eosinophils Relative 2 0 - 5 %   Eosinophils Absolute 0.2 0.0 - 0.7 K/uL   Basophils Relative 0 0 - 1 %   Basophils Absolute 0.0 0.0 - 0.1 K/uL  Comprehensive metabolic panel     Status: Abnormal   Collection Time: 09/11/14  6:15 AM  Result Value Ref Range   Sodium 138 135 - 145 mmol/L   Potassium 4.3 3.5 - 5.1 mmol/L   Chloride 109 96 - 112 mmol/L   CO2 22 19 - 32 mmol/L   Glucose, Bld 111 (H) 70 - 99 mg/dL   BUN 11 6 - 23 mg/dL   Creatinine, Ser 0.69 0.50 - 1.10 mg/dL   Calcium 8.8 8.4 - 10.5 mg/dL   Total Protein 4.6 (L) 6.0 - 8.3 g/dL   Albumin 2.0 (L) 3.5 - 5.2 g/dL   AST 28 0 - 37 U/L   ALT 15 0 - 35 U/L   Alkaline Phosphatase 59 39 - 117 U/L   Total Bilirubin 0.8 0.3 - 1.2 mg/dL   GFR calc non Af Amer 81 (L) >90 mL/min   GFR calc Af Amer >90 >90 mL/min    Comment: (NOTE) The eGFR has been calculated using the CKD EPI equation. This calculation has not been validated in all clinical situations. eGFR's persistently <90 mL/min signify possible Chronic Kidney Disease.    Anion gap 7 5 - 15  Glucose, capillary     Status: Abnormal   Collection Time: 09/11/14  8:31 AM  Result Value Ref Range   Glucose-Capillary 154 (H) 70 - 99 mg/dL   Comment 1 Notify RN   Glucose, capillary     Status: None   Collection Time: 09/11/14  3:32 PM  Result Value Ref Range   Glucose-Capillary 71 70 - 99 mg/dL  Glucose, capillary     Status: Abnormal   Collection Time: 09/11/14  8:17 PM  Result Value Ref Range   Glucose-Capillary 126 (H) 70 - 99 mg/dL  Glucose, capillary     Status: Abnormal   Collection Time: 09/11/14 11:56 PM  Result Value Ref Range   Glucose-Capillary 46 (L) 70 - 99 mg/dL  Glucose, capillary     Status: None   Collection Time: 09/12/14 12:35 AM  Result Value Ref Range   Glucose-Capillary 88 70 - 99 mg/dL  Glucose, capillary     Status: None   Collection Time: 09/12/14  3:18 AM  Result Value Ref Range   Glucose-Capillary 81 70 - 99 mg/dL  Troponin I     Status:  Abnormal   Collection Time: 09/12/14  4:05 AM  Result Value Ref Range   Troponin I 0.08 (H) <0.031 ng/mL    Comment:        PERSISTENTLY INCREASED TROPONIN VALUES IN THE RANGE OF 0.04-0.49 ng/mL CAN BE SEEN IN:       -UNSTABLE ANGINA       -CONGESTIVE HEART FAILURE       -MYOCARDITIS       -CHEST TRAUMA       -ARRYHTHMIAS       -LATE PRESENTING MYOCARDIAL INFARCTION       -COPD   CLINICAL FOLLOW-UP RECOMMENDED.   Glucose, capillary     Status: None   Collection Time: 09/12/14  7:51 AM  Result Value Ref Range   Glucose-Capillary 76 70 - 99 mg/dL  Glucose, capillary     Status: Abnormal   Collection Time: 09/12/14 12:20 PM  Result Value Ref Range   Glucose-Capillary 130 (H) 70 - 99 mg/dL   Comment 1 Notify RN   Glucose, capillary     Status: Abnormal   Collection Time: 09/12/14  4:40 PM  Result Value Ref Range   Glucose-Capillary 128 (H) 70 - 99 mg/dL  Glucose, capillary     Status: Abnormal   Collection Time: 09/12/14  7:55 PM  Result Value Ref Range   Glucose-Capillary 145 (H) 70 - 99 mg/dL  Glucose, capillary     Status: None   Collection Time: 09/13/14 12:09 AM  Result Value Ref Range   Glucose-Capillary 92 70 - 99 mg/dL  Glucose, capillary     Status: Abnormal   Collection Time: 09/13/14  4:12 AM  Result Value Ref Range   Glucose-Capillary 128 (H) 70 - 99 mg/dL  Glucose, capillary     Status: None   Collection Time: 09/13/14  7:37 AM  Result Value Ref Range   Glucose-Capillary 71 70 - 99 mg/dL    Gen- alert c/o neck and L sided chest wall pain HEENT: normal Cardio: RRR and no murmur Resp: CTA B/L GI: BS positive and NT, ND Extremity:  Pulses positive and No Edema Skin:   Intact and Wound neck incision without drainage on dressing Neuro: Lethargic, Confused, Normal Sensory, Abnormal Motor 4/5 in LUE and LLE, 5- RUE and RLE and Inattention Musc/Skel:  Neck tender and Other tenderness around right neck incision site and SCM Gen NAD  Chest- very tender to  gentle palpation   Medical Problem List and Plan: 1. 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 DVT Prophylaxis/Anticoagulation: Nonocclusive DVT right popliteal vein and DVT left posterior tibial vein. Status post IVC filter 09/09/2014 per interventional radiology 3. Pain Management: Ultram 50-100 mg twice a day as needed and Robaxin. Monitor with increased mobility 4. Dysphagia. Dysphagia 1 honey liquids. Monitor hydration with IV fluids 5.  Anterior chest wall pain- resolved  6. Hypertension. No current antihypertensive medication. Patient on lisinopril-hydrochlorothiazide 20-25 milligrams daily prior to admission. Resume as needed- remains stable off meds  7. Diabetes mellitus with peripheral neuropathy. Hemoglobin A1c of 12. Lantus insulin decreased to 10 units yesterday; will further down titrate. Check blood sugars before meals and at bedtime. Diabetic teaching  LOS (Days) 3 A FACE TO FACE EVALUATION WAS PERFORMED  Nyoka Cowden 09/13/2014, 9:56 AM

## 2014-09-14 ENCOUNTER — Encounter (HOSPITAL_COMMUNITY): Payer: Medicare Other | Admitting: Occupational Therapy

## 2014-09-14 ENCOUNTER — Encounter (HOSPITAL_COMMUNITY): Payer: Self-pay | Admitting: General Practice

## 2014-09-14 ENCOUNTER — Inpatient Hospital Stay (HOSPITAL_COMMUNITY): Payer: Medicare Other | Admitting: Rehabilitation

## 2014-09-14 ENCOUNTER — Inpatient Hospital Stay (HOSPITAL_COMMUNITY): Payer: Medicare Other | Admitting: Speech Pathology

## 2014-09-14 DIAGNOSIS — I82442 Acute embolism and thrombosis of left tibial vein: Secondary | ICD-10-CM | POA: Diagnosis not present

## 2014-09-14 DIAGNOSIS — Z79899 Other long term (current) drug therapy: Secondary | ICD-10-CM | POA: Diagnosis not present

## 2014-09-14 DIAGNOSIS — I82431 Acute embolism and thrombosis of right popliteal vein: Secondary | ICD-10-CM | POA: Diagnosis not present

## 2014-09-14 DIAGNOSIS — E1142 Type 2 diabetes mellitus with diabetic polyneuropathy: Secondary | ICD-10-CM | POA: Diagnosis not present

## 2014-09-14 DIAGNOSIS — E440 Moderate protein-calorie malnutrition: Secondary | ICD-10-CM | POA: Diagnosis not present

## 2014-09-14 DIAGNOSIS — Z794 Long term (current) use of insulin: Secondary | ICD-10-CM | POA: Diagnosis not present

## 2014-09-14 DIAGNOSIS — Q211 Atrial septal defect: Secondary | ICD-10-CM | POA: Diagnosis not present

## 2014-09-14 DIAGNOSIS — E785 Hyperlipidemia, unspecified: Secondary | ICD-10-CM | POA: Diagnosis not present

## 2014-09-14 DIAGNOSIS — I10 Essential (primary) hypertension: Secondary | ICD-10-CM | POA: Diagnosis not present

## 2014-09-14 DIAGNOSIS — I69354 Hemiplegia and hemiparesis following cerebral infarction affecting left non-dominant side: Secondary | ICD-10-CM | POA: Diagnosis not present

## 2014-09-14 DIAGNOSIS — I82403 Acute embolism and thrombosis of unspecified deep veins of lower extremity, bilateral: Secondary | ICD-10-CM | POA: Diagnosis not present

## 2014-09-14 DIAGNOSIS — B379 Candidiasis, unspecified: Secondary | ICD-10-CM | POA: Diagnosis not present

## 2014-09-14 DIAGNOSIS — K219 Gastro-esophageal reflux disease without esophagitis: Secondary | ICD-10-CM | POA: Diagnosis not present

## 2014-09-14 DIAGNOSIS — Z7982 Long term (current) use of aspirin: Secondary | ICD-10-CM | POA: Diagnosis not present

## 2014-09-14 DIAGNOSIS — Z888 Allergy status to other drugs, medicaments and biological substances status: Secondary | ICD-10-CM | POA: Diagnosis not present

## 2014-09-14 DIAGNOSIS — Z885 Allergy status to narcotic agent status: Secondary | ICD-10-CM | POA: Diagnosis not present

## 2014-09-14 DIAGNOSIS — R079 Chest pain, unspecified: Secondary | ICD-10-CM | POA: Diagnosis not present

## 2014-09-14 DIAGNOSIS — Z8673 Personal history of transient ischemic attack (TIA), and cerebral infarction without residual deficits: Secondary | ICD-10-CM | POA: Diagnosis not present

## 2014-09-14 DIAGNOSIS — H269 Unspecified cataract: Secondary | ICD-10-CM | POA: Diagnosis not present

## 2014-09-14 LAB — GLUCOSE, CAPILLARY
GLUCOSE-CAPILLARY: 118 mg/dL — AB (ref 70–99)
GLUCOSE-CAPILLARY: 154 mg/dL — AB (ref 70–99)
Glucose-Capillary: 111 mg/dL — ABNORMAL HIGH (ref 70–99)
Glucose-Capillary: 101 mg/dL — ABNORMAL HIGH (ref 70–99)
Glucose-Capillary: 190 mg/dL — ABNORMAL HIGH (ref 70–99)

## 2014-09-14 LAB — LIPID PANEL
Cholesterol: 182 mg/dL (ref 0–200)
HDL: 33 mg/dL — AB (ref >39)
LDL CALC: 124 mg/dL — AB (ref 0–99)
Total CHOL/HDL Ratio: 5.5 RATIO
Triglycerides: 123 mg/dL (ref <150)
VLDL: 25 mg/dL (ref 0–40)

## 2014-09-14 LAB — TROPONIN I
TROPONIN I: 0.03 ng/mL (ref <0.031)
Troponin I: <0.03 ng/mL (ref <0.031)

## 2014-09-14 LAB — LIPASE, BLOOD: Lipase: 21 U/L (ref 11–59)

## 2014-09-14 MED ORDER — NYSTATIN 100000 UNIT/ML MT SUSP
5.0000 mL | Freq: Four times a day (QID) | OROMUCOSAL | Status: DC
  Administered 2014-09-14 – 2014-09-16 (×7): 500000 [IU] via ORAL
  Filled 2014-09-14 (×11): qty 5

## 2014-09-14 MED ORDER — DULOXETINE HCL 30 MG PO CPEP
30.0000 mg | ORAL_CAPSULE | Freq: Every day | ORAL | Status: DC
  Administered 2014-09-14 – 2014-09-16 (×3): 30 mg via ORAL
  Filled 2014-09-14 (×3): qty 1

## 2014-09-14 MED ORDER — GLUCERNA SHAKE PO LIQD
237.0000 mL | Freq: Three times a day (TID) | ORAL | Status: DC
  Administered 2014-09-14 – 2014-09-16 (×3): 237 mL via ORAL

## 2014-09-14 MED ORDER — ENSURE COMPLETE PO LIQD
237.0000 mL | Freq: Two times a day (BID) | ORAL | Status: DC
  Administered 2014-09-16: 237 mL via ORAL

## 2014-09-14 MED ORDER — INSULIN ASPART 100 UNIT/ML ~~LOC~~ SOLN
0.0000 [IU] | Freq: Three times a day (TID) | SUBCUTANEOUS | Status: DC
  Administered 2014-09-14 – 2014-09-15 (×2): 2 [IU] via SUBCUTANEOUS
  Administered 2014-09-15: 3 [IU] via SUBCUTANEOUS

## 2014-09-14 NOTE — Evaluation (Signed)
Physical Therapy Evaluation Patient Details Name: Christy Simmons MRN: [id number] DOB: Dec 30, 1934 Today's Date: 09/14/2014   History of Present Illness  80 y.o. female admitted with chest pain from inpatient rehabilitation. History of recent right stroke of basal ganglia: who during revascularization transformed into ICH (intracerebral hemorrhage) at the putamen, with resulting left hemiplegia.  Clinical Impression  Pt admitted with the above complications. Pt currently with functional limitations due to the deficits listed below (see PT Problem List). Currently requires mod assist for transfers and demonstrating difficulty with sequencing and coordination. Min assist for bed mobility. Pt would benefit returning to CIR as it appears she was making good progress. Pt will benefit from skilled PT to increase their independence and safety with mobility to allow discharge to the venue listed below.       Follow Up Recommendations CIR    Equipment Recommendations  None recommended by PT    Recommendations for Other Services Rehab consult     Precautions / Restrictions Precautions Precautions: Fall Precaution Comments: L hemiparesis, ataxia Restrictions Weight Bearing Restrictions: No      Mobility  Bed Mobility Overal bed mobility: Needs Assistance Bed Mobility: Rolling;Sidelying to Sit Rolling: Min guard Sidelying to sit: Min assist       General bed mobility comments: Min guard with cues for rolling to pt Rt side. Min assist for truncal support to assume seated position. Use of rail encouraged.  Transfers Overall transfer level: Needs assistance Equipment used: 1 person hand held assist Transfers: Sit to/from [facility] Sit to Stand: Mod assist Stand pivot transfers: Mod assist       General transfer comment: Mod assist for boost to stand from lowest bed setting x2, and from recliner x1. Improved stability when given support on her Lt side. Cues for hand  placement and upright posture upon standing. Ataxic movements of LEs when attempting to step including difficulty sequencing with pivot to chair. Hand held assist provided, would likely do well with RW. Leans to left side but responds well to verbal cues to correct. No buckling of knees noted.  Ambulation/Gait                Stairs            Wheelchair Mobility    Modified Rankin (Stroke Patients Only)       Balance Overall balance assessment: Needs assistance Sitting-balance support: Single extremity supported;Feet supported Sitting balance-Leahy Scale: Poor   Postural control: Left lateral lean Standing balance support: Single extremity supported Standing balance-Leahy Scale: Poor                               Pertinent Vitals/Pain Pain Assessment: Faces Faces Pain Scale: Hurts a little bit Pain Location: BIL LEs Pain Intervention(s): Limited activity within patient's tolerance;Monitored during session;Premedicated before session;Repositioned    Home Living Family/patient expects to be discharged to:: Inpatient rehab Living Arrangements: Spouse/significant other Available Help at Discharge: Family;Available 24 hours/day Type of Home: House Home Access: Stairs to enter Entrance Stairs-Rails: None Entrance Stairs-Number of Steps: 3 with no rail and 7 with R rail  Home Layout: One level Home Equipment: Walker - 2 wheels;Cane - single point;Bedside commode Additional Comments: tub/shower combo with curtain in most used bathroom and walk in shower in other bathroom    Prior Function Level of Independence: Independent (Prior to CVA)         Comments: likes gardening  Hand Dominance   Dominant Hand: Right    Extremity/Trunk Assessment   Upper Extremity Assessment: Defer to OT evaluation           Lower Extremity Assessment: Generalized weakness (Poor coordination of LEs during functional tasks)         Communication    Communication: No difficulties  Cognition Arousal/Alertness: Awake/alert Behavior During Therapy: WFL for tasks assessed/performed Overall Cognitive Status: Impaired/Different from baseline Area of Impairment: Following commands       Following Commands: Follows one step commands consistently            General Comments      Exercises        Assessment/Plan    PT Assessment Patient needs continued PT services  PT Diagnosis Difficulty walking;Generalized weakness;Acute pain   PT Problem List Decreased strength;Decreased activity tolerance;Decreased balance;Decreased mobility;Decreased coordination;Decreased cognition;Decreased knowledge of use of DME;Pain  PT Treatment Interventions DME instruction;Gait training;Functional mobility training;Therapeutic activities;Therapeutic exercise;Balance training;Neuromuscular re-education;Cognitive remediation;Patient/family education;Modalities   PT Goals (Current goals can be found in the Care Plan section) Acute Rehab PT Goals Patient Stated Goal: Go back to rehab PT Goal Formulation: With patient/family Time For Goal Achievement: 09/28/14 Potential to Achieve Goals: Good    Frequency Min 3X/week   Barriers to discharge        Co-evaluation               End of Session Equipment Utilized During Treatment: Gait belt Activity Tolerance: Patient tolerated treatment well Patient left: in chair;with call bell/phone within reach;with family/visitor present Nurse Communication: Mobility status         Time: 1445-1510 PT Time Calculation (min) (ACUTE ONLY): 25 min   Charges:   PT Evaluation $Initial PT Evaluation Tier I: 1 Procedure PT Treatments $Therapeutic Activity: 8-22 mins   PT G CodesEllouise Newer 09/14/2014, 4:32 PM Camille Bal [place], [place]

## 2014-09-14 NOTE — Progress Notes (Addendum)
INITIAL NUTRITION ASSESSMENT  DOCUMENTATION CODES Per approved criteria  -Not Applicable   INTERVENTION: -Continue Glucerna Shakes (thickened to appropriate consistency) TID, each supplement provides 220 kcal and 10 grams of protein -Provide Magic Cup ice cream TID with meals until PO intake improved  NUTRITION DIAGNOSIS: Inadequate oral intake related to dysphagia and poor appetite as evidenced by </=10% meal completion.   Goal: Pt to meet >/= 90% of their estimated nutrition needs   Monitor:  PO intake, supplement acceptance, labs, weight trend  Reason for Assessment: MST=2, Consult to assess needs  80 y.o. female  Admitting Dx: Chest pain  Christy Simmons is a 80 y.o. female with PMH significant for HTN, HLD, diabetes, hx of PFO and atrial aneurysm; who was recently admitted secondary to acute right side stroke with conversion to [place] with revascularization. Patient was at inpatient rehab when suddenly developed excruciating pain in the left side of her chest.  ASSESSMENT: Pt admitted from CIR for chest pain.  Pt unavailable for interview and exam, due to pt working with therapy at time of visit. Chart reviewed. Pt with hx of poor po intake (RD followed closely at CIR) and receives Glucerna and Magic Cup supplements PTA on CIR. Meal intake continues to be poor (10% meal completion). She is also receiving Glucerna supplements TID- RD will continue order and ensure pt receives [employer] on each tray. Wt hx reveals 15# (10.6%) wt loss over the past year.  Labs reviewed. Glucose: 142. CBGS: 111-118.   Height: Ht Readings from Last 1 Encounters:  09/13/14 5\' 5"  (1.651 m)    Weight: Wt Readings from Last 1 Encounters:  09/13/14 126 lb 12.2 oz (57.5 kg)    Ideal Body Weight: 125#  % Ideal Body Weight: 101%  Wt Readings from Last 10 Encounters:  09/13/14 126 lb 12.2 oz (57.5 kg)  09/10/14 123 lb 1.6 oz (55.838 kg)  09/06/14 122 lb 5.7 oz (55.5 kg)  09/11/13 141 lb 15.6 oz  (64.4 kg)  08/29/13 133 lb 12.8 oz (60.691 kg)  06/16/13 135 lb (61.236 kg)  01/10/13 150 lb (68.04 kg)  11/10/11 150 lb (68.04 kg)  10/26/11 146 lb 12.8 oz (66.588 kg)    Usual Body Weight: 150#  % Usual Body Weight: 84%  BMI:  Body mass index is 21.09 kg/(m^2). Normal weight range  Estimated Nutritional Needs: Kcal: 1500-1700 Protein: 63-73 grams Fluid: 1.5-1.7 L  Skin: closed incision to rt neck  Diet Order: DIET - DYS 1 with honey thick liquids  EDUCATION NEEDS: -Education not appropriate at this time   Intake/Output Summary (Last 24 hours) at 09/14/14 1324 Last data filed at 09/14/14 0900  Gross per 24 hour  Intake    765 ml  Output    470 ml  Net    295 ml    Last BM: 09/12/14  Labs:   Recent Labs Lab 09/11/14 0615 09/13/14 1846  NA 138 136  K 4.3 4.5  CL 109 106  CO2 22 22  BUN 11 12  CREATININE 0.69 0.77  CALCIUM 8.8 9.2  GLUCOSE 111* 142*    CBG (last 3)   Recent Labs  09/13/14 1611 09/13/14 2158 09/14/14 0739  GLUCAP 122* 111* 118*    Scheduled Meds: . aspirin EC  81 mg Oral Daily  . atorvastatin  20 mg Oral q1800  . brimonidine  1 drop Both Eyes BID  . calcium-vitamin D  1 tablet Oral Q breakfast  . cholecalciferol  1,000 Units  Oral Daily  . DULoxetine  30 mg Oral Daily  . feeding supplement (GLUCERNA 1.2 CAL)  237 mL Oral TID BM  . insulin aspart  0-9 Units Subcutaneous TID WC  . insulin glargine  10 Units Subcutaneous Daily  . magnesium oxide  400 mg Oral Daily  . metoprolol tartrate  12.5 mg Oral BID  . multivitamin with minerals  1 tablet Oral Daily  . pantoprazole  40 mg Oral BID    Continuous Infusions: . sodium chloride 50 mL/hr at 09/13/14 1942    Past Medical History  Diagnosis Date  . Transient ischemic attack   . HTN (hypertension)   . Hyperlipidemia   . DM (diabetes mellitus)   . GERD (gastroesophageal reflux disease)   . Heart disease     PFO  . Amnesia 01/10/2013  . Patent foramen ovale with atrial  septal aneurysm   . Complication of anesthesia   . PONV (postoperative nausea and vomiting)   . Family history of anesthesia complication     "My brother has trouble waking up."  . Glaucoma     Hx: of  . Early cataracts, bilateral     Hx: of  . Arthritis   . Stroke     Past Surgical History  Procedure Laterality Date  . US echocardiography  10-13-11    EF 60-65% normal  . Back surgery      disc   . Tonsillectomy    . Tee without cardioversion  10/30/2011    Procedure: TRANSESOPHAGEAL ECHOCARDIOGRAM (TEE);  Surgeon: Larey Dresser, MD;  Location: [facility];  Service: Cardiovascular;  Laterality: N/A;  . Colonoscopy w/ biopsies and polypectomy      HX;of  . Tonsillectomy    . Tubal ligation    . Lumbar laminectomy/decompression microdiscectomy N/A 09/11/2013    Procedure: LUMBAR LAMINECTOMY/DECOMPRESSION MICRODISCECTOMY 2 LEVELS;  Surgeon: Ophelia Charter, MD;  Location: [facility];  Service: Neurosurgery;  Laterality: N/A;  Lumbar Four-Five Laminectomy and Foraminotomy with redo Lumbar Five-Sacral One diskectomy  . Radiology with anesthesia N/A 09/05/2014    Procedure: RADIOLOGY WITH ANESTHESIA;  Surgeon: Rob Hickman, MD;  Location: [facility];  Service: Radiology;  Laterality: N/A;    Anwar Sakata A. Jimmye Norman, RD, LDN, CDE Pager: [phone] After hours Pager: [phone]

## 2014-09-14 NOTE — Progress Notes (Addendum)
[facility] PROGRESS NOTE  [name] SWF:[id number] DOB: Feb 28, 1935 DOA: 09/13/2014 PCP: Leonides Sake, MD  Assessment/Plan: 1-Chest pain: heart score 5. -troponin neg X2 -EKG and telemetry w/o acute ischemic changes -doubt pain is cardiac related -will continue metoprolol, statins and continue aspirin  -patient with recent 2-D echo demonstrating no wall motion abnormalities and preserved EF -she has positive bilateral DVT and D-dimer was ordered and elevated > 20; but she is status post IVC filter placement and do not present SOB or tachycardia; also due to recent ICH unable to use anticoagulation. Will hold on CTA at this moment. Decision discussed/informed to family. -pain is atypical; but patient with significant risk factors (heart score 5) -will continue patient on protonix BID and continue robaxin/valium for muscle spasm -patient will also be started on cymbalta for neuropathy   2-Malnutrition of moderate degree: will continue using glucerna TID -dietician was actively following patient at rehab unit; will request follow up while inpatient as well  3-patient with recent right stroke of basal ganglia: who during revascularization transformed into ICH (intracerebral hemorrhage) at the putamen. -per neurology ok to continue ASA for now and then in 5 days (which would be around 2/16-2/17) repeat CT head; if bleeding subsided plan would be for eliquis 2.5mg  BID -patient with positive PFO and bilateral DVT  4-Essential hypertension: stable.  -Will continue metoprolol  5-uncontrolled Diabetes: A1C 12 -will continue sensitive SSI -continue lantus -use modified carbohydrates diet -adjust insulin therapy as needed base on CBG's  6-HLD (hyperlipidemia): continue statins  7-GERD (gastroesophageal reflux disease): will continue PPI  8-DVT of lower extremity, bilateral: s/p IVC filter due to inability for anticoagulation with recent [place] -patient complaining of pain -will  continue pain meds as needed  9-left hemiparesis/dysphagia: residual effect from CVA -continue dysphagia 1 and honey thick liquids -was actively receiving rehabilitation prior to admission; will involve PT/OT back  10-neuropathy: appears to be a residual from recent stroke -will start patient on cymbalta  11-thrush: will use nystatin  Code Status: DNR Family Communication: daughter at bedside Disposition Plan:will transfer out of stepdown; patient will be move to telemetry for another 24 hrs evaluation   Consultants:  None   Procedures:  See below for x-ray reports   Antibiotics:  None   HPI/Subjective: Afebrile, Denies CP, SOB, diaphoresis and palpitations. Patient endorses pain her legs and epigastric region. She had associated muscle spasm/shaquing (especially upper extremities) with pain.  Objective: Filed Vitals:   09/14/14 0736  BP:   Pulse: 72  Temp: 98.3 F (36.8 C)  Resp: 18    Intake/Output Summary (Last 24 hours) at 09/14/14 0838 Last data filed at 09/14/14 0700  Gross per 24 hour  Intake    565 ml  Output    250 ml  Net    315 ml   Filed Weights   09/13/14 1800  Weight: 57.5 kg (126 lb 12.2 oz)    Exam:   General:  Complaining of LE pain and muscle spasm in her upper extremities. No fever, no cough and no SOB.  Cardiovascular: S1 and S2, no rubs or gallops  Respiratory: CTA bilaterally  Abdomen: soft, some epigastric discomfort on palpation, no distension and hepatomegaly   Musculoskeletal: no joint swelling and no erythema  Neurologic: no new deficit; continue to have residual left hemiparesis (affecting LLE > LUE); patient pain in legs and associated muscle spasm  Data Reviewed: Basic Metabolic Panel:  Recent Labs Lab 09/11/14 0615 09/13/14 1846  NA 138 136  K 4.3 4.5  CL 109 106  CO2 22 22  GLUCOSE 111* 142*  BUN 11 12  CREATININE 0.69 0.77  CALCIUM 8.8 9.2   Liver Function Tests:  Recent Labs Lab 09/11/14 0615   AST 28  ALT 15  ALKPHOS 59  BILITOT 0.8  PROT 4.6*  ALBUMIN 2.0*    Recent Labs Lab 09/14/14 0040  LIPASE 21   CBC:  Recent Labs Lab 09/11/14 0615 09/13/14 1846  WBC 7.6 8.6  NEUTROABS 5.0 5.7  HGB 9.4* 9.3*  HCT 27.9* 28.1*  MCV 87.2 86.7  PLT 211 187   Cardiac Enzymes:  Recent Labs Lab 09/12/14 0405 09/13/14 1846 09/14/14 0040  CKTOTAL  --  19  --   CKMB  --  1.3  --   TROPONINI 0.08*  --  0.03   BNP (last 3 results) No results for input(s): BNP in the last 8760 hours.  ProBNP (last 3 results) No results for input(s): PROBNP in the last 8760 hours.  CBG:  Recent Labs Lab 09/13/14 0737 09/13/14 1147 09/13/14 1611 09/13/14 2158 09/14/14 0739  GLUCAP 71 111* 122* 111* 118*    Recent Results (from the past 240 hour(s))  MRSA PCR Screening     Status: None   Collection Time: 09/05/14  2:54 PM  Result Value Ref Range Status   MRSA by PCR NEGATIVE NEGATIVE Final    Comment:        The GeneXpert MRSA Assay (FDA approved for NASAL specimens only), is one component of a comprehensive MRSA colonization surveillance program. It is not intended to diagnose MRSA infection nor to guide or monitor treatment for MRSA infections.      Studies: Dg Chest 2 View  09/13/2014   CLINICAL DATA:  Chest pain  EXAM: CHEST  2 VIEW  COMPARISON:  September 05, 2014  FINDINGS: Endotracheal tube no longer present. No pneumothorax. Lungs are clear. Heart size and pulmonary vascularity are normal. No adenopathy. There is evidence of a prior fracture of the left clavicle, healed. No acute rib fracture or other rib lesion is identified on this study. Bones do appear somewhat osteoporotic.  IMPRESSION: No edema or consolidation. No pneumothorax. Healed rib fracture left clavicle. No acute fracture elsewhere.   Electronically Signed   By: Lowella Grip III M.D.   On: 09/13/2014 21:27    Scheduled Meds: . aspirin EC  81 mg Oral Daily  . atorvastatin  20 mg Oral q1800  .  brimonidine  1 drop Both Eyes BID  . calcium-vitamin D  1 tablet Oral Q breakfast  . cholecalciferol  1,000 Units Oral Daily  . DULoxetine  30 mg Oral Daily  . feeding supplement (GLUCERNA 1.2 CAL)  237 mL Oral TID BM  . insulin aspart  0-9 Units Subcutaneous TID WC  . insulin glargine  10 Units Subcutaneous Daily  . magnesium oxide  400 mg Oral Daily  . metoprolol tartrate  12.5 mg Oral BID  . multivitamin with minerals  1 tablet Oral Daily  . pantoprazole  40 mg Oral BID   Continuous Infusions: . sodium chloride 50 mL/hr at 09/13/14 1942    Principal Problem:   Chest pain Active Problems:   Malnutrition of moderate degree   ICH (intracerebral hemorrhage)   Essential hypertension   Diabetes   HLD (hyperlipidemia)   GERD (gastroesophageal reflux disease)   DVT of lower extremity, bilateral    Time spent: 30 minutes   Barton Dubois  [facility] Pager  [phone]. If 7PM-7AM, please contact night-coverage at [email or web address], password [facility] 09/14/2014, 8:38 AM  LOS: 1 day

## 2014-09-14 NOTE — Progress Notes (Signed)
Pt admitted to inpt rehab on 2/11 and returned to acute hospital 09/13/14 due to chest pain. I have discussed with Dr. Letta Pate and Dr. Dyann Kief. I await PT and OT reevaluations on 2/16 so that I can pursue [employer] approval to readmit pt to inpt rehab tomorrow 2/16. Pt has alot of complaints of pain and it has been difficult to determine most appropriate medication regimen that does not make pt lethargic and unable to participate in therapy. I reemphasized to pt and husband at bedside that progression with mobility and pain management will take time. I offered SNF level rehab for a slower pace but both wish to pursue inpt rehab admission at this time. I have contacted acute therapy department to assist with therapy evaluations in the morning. [phone]

## 2014-09-14 NOTE — Discharge Summary (Signed)
NAMETISHIE, ALTMANN NO.:  [id number]  MEDICAL RECORD NO.:  [id number]  LOCATION:                                FACILITY:  [facility]  PHYSICIAN:  [name], M.D.DATE OF BIRTH:  [birth year]  DATE OF ADMISSION:  09/10/2014 DATE OF DISCHARGE:  09/13/2014                              DISCHARGE SUMMARY   DISCHARGE DIAGNOSES: 1. Functional deficits, secondary to right basal ganglia infarction     from right MCA occlusion, status post revascularization,     hemorrhagic conversion on the right putamen. 2. Nonocclusive deep vein thrombosis right popliteal vein and deep     vein thrombosis left posterior tibial vein, status post IVC filter     09/09/2014. 3. Pain management. 4. Nonspecific chest pain. 5. Dysphagia. 6. History of patent foramen ovale with atrial septal aneurysm. 7. Hypertension. 8. Diabetes mellitus with peripheral neuropathy.  HISTORY OF PRESENT ILLNESS:  This is a 80 year old right-handed female with history of hypertension, diabetes mellitus, PFO with atrial septal aneurysm, maintained on aspirin, who was independent prior to admission living with her husband,  presented 09/05/2014 with left-sided weakness and altered mental status.  CT of the head showed a well-defined area of hypodensity around the right basal ganglia.  The patient did not receive tPA.  CT angio of the head and neck showed distal right M1 occlusion, abnormal perfusion throughout essentially the entirety of the right MCA territory.  Underwent complete revascularization of occluded right MCA per Interventional Radiology.  MRI of the brain showed patchy right MCA branch infarct with small hemorrhagic transformation in the right basal ganglia, but no significant midline shift.  Echocardiogram with ejection fraction of 54% grade 1 diastolic dysfunction.  Neurology consulted, maintained on aspirin therapy.  The patient did require intubation, followed by Critical Care, extubated  without difficulty 09/07/2014.  A venous Doppler study completed on 09/07/2014 showed finding consistent with nonocclusive DVT right popliteal vein as well as DVT left posterior tibial vein.  Interventional Radiology consulted with IVC filter placed on 09/09/2014.  Modified barium swallow 09/08/2014, recommendations of dysphagia 1 honey thick liquid diet.  Physical and occupational therapy ongoing.  The patient was admitted for comprehensive rehab program.  PAST MEDICAL HISTORY:  See discharge diagnoses.  SOCIAL HISTORY:  Lives with spouse.  FUNCTIONAL HISTORY:  Prior to admission, independent.  FUNCTIONAL STATUS:  Upon admission to rehab services, was 2-person handheld assist for equipment, plus 2 physical assist sit to stand, moderate assist plus 2, stand pivot transfers max assist supine to sit, plus 2 physical assist toilet transfers, total assist upper body, lower body dressing.  PHYSICAL EXAMINATION:  VITAL SIGNS:  Blood pressure 128/59, pulse 72, temperature 98, respirations 18. GENERAL:  This was a lethargic female, but arousable.  She appeared quite fatigued. NEURO:  She was able to state her first name and last name as well as age and date of birth.  Followed simple commands.  Her voice was but a whisper, but intelligible.  She did exhibit some left facial droop. LUNGS:  Decreased breath sounds.  Clear to auscultation. CARDIAC:  Regular rate and rhythm. ABDOMEN:  Soft, nontender.  Good bowel sounds.  REHABILITATION HOSPITAL COURSE:  The patient was admitted to inpatient rehab services with therapies initiated on a 3-hour daily basis consisting of physical therapy, occupational therapy, speech therapy, and rehabilitation nursing.  The following issues were addressed during the patient's rehabilitation stay.  Pertaining to Ms. Quakenbush functional deficits secondary to right basal ganglia infarct with MCA occlusion, she had undergone revascularization hemorrhagic conversion on  the right putamen.  She continued to be followed by Neurology Services.  An IVC filter was placed for a nonocclusive DVT right popliteal vein, DVT left posterior tibial vein.  She remained afebrile.  Pain management with the use of Ultram and Robaxin; however, family had requested the need for Dilaudid. The patient did have some stiffness of her neck with application of a K-pad with good results.  It was discussed at length with family the need to abstain from any heavy narcotics due to increasing bouts of lethargy.  She was on a dysphagia 1 honey thick liquid diet, IV fluids for hydration and exhibit no signs of fluid overload.  She did have a history of PFO, atrial septal aneurysm.  She had been on aspirin therapy prior to admission.  Blood pressures monitored, no orthostatic changes.  She did have a history of diabetes mellitus.  Hemoglobin A1c of 12.  She was on Lantus therapy, checking blood sugars a.c. and nightly.  Therapies had been initiated.  The patient with slow progressive gains.  The patient rolled left with side rail, min to mod assist, rolled to the right side rails max assist, transfer at edge of bed to wheelchair with max assist and verbal cues. Propelled her wheelchair about 20 feet bilateral lower extremities. Transfer to sit to stand multiple times in the gym.  She was ambulating short distances, max assist, rolling walker.  She was limited by fatigue.  On 09/13/2014 nonspecific complaints of chest pain.  Mainly epigastric region.  No nausea.  No sweating.  No vomiting.  She did receive nitroglycerin x2 without relief.  EKG sinus rhythm, no acute ischemic changes appreciated.  Troponin 0.08.  D-dimer greater than 20. Followup troponin level 0.03.  Due to these findings, she was discharged to [place] for ongoing evaluation and workup for chest pain. Orders were given to cycle cardiac enzymes.  All medication changes were at the recommendations of the  hospitalist team.     Lauraine Rinne, P.A.   ______________________________ [name], M.D.    DA/MEDQ  D:  09/14/2014  T:  09/14/2014  Job:  [id number]

## 2014-09-14 NOTE — Discharge Summary (Signed)
Discharge summary job # [id number]

## 2014-09-14 NOTE — Discharge Summary (Deleted)
NAMEJARYIAH, MEHLMAN NO.:  [id number]  MEDICAL RECORD NO.:  [id number]  LOCATION:                                FACILITY:  [facility]  PHYSICIAN:  [name], M.D.DATE OF BIRTH:  1935-06-21  DATE OF ADMISSION:  09/10/2014 DATE OF DISCHARGE:  09/13/2014                              DISCHARGE SUMMARY   DISCHARGE DIAGNOSES: 1. Functional deficits, secondary to right basal ganglia infarction     from right MCA occlusion, status post revascularization,     hemorrhagic conversion on the right putamen. 2. Nonocclusive deep vein thrombosis right popliteal vein and deep     vein thrombosis left posterior tibial vein, status post IVC filter     09/09/2014. 3. Pain management. 4. Nonspecific chest pain. 5. Dysphagia. 6. History of patent foramen ovale with atrial septal aneurysm. 7. Hypertension. 8. Diabetes mellitus with peripheral neuropathy.  HISTORY OF PRESENT ILLNESS:  This is a 80 year old right-handed female with history of hypertension, diabetes mellitus, PFO with atrial septal aneurysm, maintained on aspirin, who was independent prior to admission living with her husband,  presented 09/05/2014 with left-sided weakness and altered mental status.  CT of the head showed a well-defined area of hypodensity around the right basal ganglia.  The patient did not receive tPA.  CT angio of the head and neck showed distal right M1 occlusion, abnormal perfusion throughout essentially the entirety of the right MCA territory.  Underwent complete revascularization of occluded right MCA per Interventional Radiology.  MRI of the brain showed patchy right MCA branch infarct with small hemorrhagic transformation in the right basal ganglia, but no significant midline shift.  Echocardiogram with ejection fraction of 35% grade 1 diastolic dysfunction.  Neurology consulted, maintained on aspirin therapy.  The patient did require intubation, followed by Critical Care, extubated  without difficulty 09/07/2014.  A venous Doppler study completed on 09/07/2014 showed finding consistent with nonocclusive DVT right popliteal vein as well as DVT left posterior tibial vein.  Interventional Radiology consulted with IVC filter placed on 09/09/2014.  Modified barium swallow 09/08/2014, recommendations of dysphagia 1 honey thick liquid diet.  Physical and occupational therapy ongoing.  The patient was admitted for comprehensive rehab program.  PAST MEDICAL HISTORY:  See discharge diagnoses.  SOCIAL HISTORY:  Lives with spouse.  FUNCTIONAL HISTORY:  Prior to admission, independent.  FUNCTIONAL STATUS:  Upon admission to rehab services, was 2-person handheld assist for equipment, plus 2 physical assist sit to stand, moderate assist plus 2, stand pivot transfers max assist supine to sit, plus 2 physical assist toilet transfers, total assist upper body, lower body dressing.  PHYSICAL EXAMINATION:  VITAL SIGNS:  Blood pressure 128/59, pulse 72, temperature 98, respirations 18. GENERAL:  This was a lethargic female, but arousable.  She appeared quite fatigued. NEURO:  She was able to state her first name and last name as well as age and date of birth.  Followed simple commands.  Her voice was but a whisper, but intelligible.  She did exhibit some left facial droop. LUNGS:  Decreased breath sounds.  Clear to auscultation. CARDIAC:  Regular rate and rhythm. ABDOMEN:  Soft, nontender.  Good bowel sounds.  REHABILITATION HOSPITAL COURSE:  The patient was admitted to inpatient rehab services with therapies initiated on a 3-hour daily basis consisting of physical therapy, occupational therapy, speech therapy, and rehabilitation nursing.  The following issues were addressed during the patient's rehabilitation stay.  Pertaining to Ms. Klostermann functional deficits secondary to right basal ganglia infarct with MCA occlusion, she had undergone revascularization hemorrhagic conversion on  the right putamen.  She continued to be followed by Neurology Services.  An IVC filter was placed for a nonocclusive DVT right popliteal vein, DVT left posterior tibial vein.  She remained afebrile.  Pain management with the use of Ultram and Robaxin; however, family had requested the need for Dilaudid. The patient did have some stiffness of her neck with application of a K-pad with good results.  It was discussed at length with family the need to abstain from any heavy narcotics due to increasing bouts of lethargy.  She was on a dysphagia 1 honey thick liquid diet, IV fluids for hydration and exhibit no signs of fluid overload.  She did have a history of PFO, atrial septal aneurysm.  She had been on aspirin therapy prior to admission.  Blood pressures monitored, no orthostatic changes.  She did have a history of diabetes mellitus.  Hemoglobin A1c of 12.  She was on Lantus therapy, checking blood sugars a.c. and nightly.  Therapies had been initiated.  The patient with slow progressive gains.  The patient rolled left with side rail, min to mod assist, rolled to the right side rails max assist, transfer at edge of bed to wheelchair with max assist and verbal cues. Propelled her wheelchair about 20 feet bilateral lower extremities. Transfer to sit to stand multiple times in the gym.  She was ambulating short distances, max assist, rolling walker.  She was limited by fatigue.  On 09/13/2014 nonspecific complaints of chest pain.  Mainly epigastric region.  No nausea.  No sweating.  No vomiting.  She did receive nitroglycerin x2 without relief.  EKG sinus rhythm, no acute ischemic changes appreciated.  Troponin 0.08.  D-dimer greater than 20. Followup troponin level 0.03.  Due to these findings, she was discharged to [place] for ongoing evaluation and workup for chest pain. Orders were given to cycle cardiac enzymes.  All medication changes were at the recommendations of the  hospitalist team.     Lauraine Rinne, P.A.   ______________________________ [name], M.D.    DA/MEDQ  D:  09/14/2014  T:  09/14/2014  Job:  [id number]

## 2014-09-15 ENCOUNTER — Encounter (HOSPITAL_COMMUNITY): Payer: Self-pay | Admitting: Radiology

## 2014-09-15 ENCOUNTER — Observation Stay (HOSPITAL_COMMUNITY): Payer: Medicare Other

## 2014-09-15 DIAGNOSIS — K219 Gastro-esophageal reflux disease without esophagitis: Secondary | ICD-10-CM | POA: Diagnosis not present

## 2014-09-15 DIAGNOSIS — R079 Chest pain, unspecified: Secondary | ICD-10-CM | POA: Diagnosis not present

## 2014-09-15 DIAGNOSIS — I82403 Acute embolism and thrombosis of unspecified deep veins of lower extremity, bilateral: Secondary | ICD-10-CM | POA: Diagnosis not present

## 2014-09-15 DIAGNOSIS — Q211 Atrial septal defect: Secondary | ICD-10-CM | POA: Diagnosis not present

## 2014-09-15 DIAGNOSIS — B379 Candidiasis, unspecified: Secondary | ICD-10-CM | POA: Diagnosis not present

## 2014-09-15 DIAGNOSIS — H269 Unspecified cataract: Secondary | ICD-10-CM | POA: Diagnosis not present

## 2014-09-15 DIAGNOSIS — E1142 Type 2 diabetes mellitus with diabetic polyneuropathy: Secondary | ICD-10-CM | POA: Diagnosis not present

## 2014-09-15 DIAGNOSIS — I610 Nontraumatic intracerebral hemorrhage in hemisphere, subcortical: Secondary | ICD-10-CM | POA: Diagnosis not present

## 2014-09-15 DIAGNOSIS — I10 Essential (primary) hypertension: Secondary | ICD-10-CM | POA: Diagnosis not present

## 2014-09-15 DIAGNOSIS — Z7982 Long term (current) use of aspirin: Secondary | ICD-10-CM | POA: Diagnosis not present

## 2014-09-15 DIAGNOSIS — E440 Moderate protein-calorie malnutrition: Secondary | ICD-10-CM | POA: Diagnosis not present

## 2014-09-15 DIAGNOSIS — Z888 Allergy status to other drugs, medicaments and biological substances status: Secondary | ICD-10-CM | POA: Diagnosis not present

## 2014-09-15 DIAGNOSIS — I69354 Hemiplegia and hemiparesis following cerebral infarction affecting left non-dominant side: Secondary | ICD-10-CM | POA: Diagnosis not present

## 2014-09-15 DIAGNOSIS — I82442 Acute embolism and thrombosis of left tibial vein: Secondary | ICD-10-CM | POA: Diagnosis not present

## 2014-09-15 DIAGNOSIS — Z8673 Personal history of transient ischemic attack (TIA), and cerebral infarction without residual deficits: Secondary | ICD-10-CM | POA: Diagnosis not present

## 2014-09-15 DIAGNOSIS — Z885 Allergy status to narcotic agent status: Secondary | ICD-10-CM | POA: Diagnosis not present

## 2014-09-15 DIAGNOSIS — Z79899 Other long term (current) drug therapy: Secondary | ICD-10-CM | POA: Diagnosis not present

## 2014-09-15 DIAGNOSIS — I82431 Acute embolism and thrombosis of right popliteal vein: Secondary | ICD-10-CM | POA: Diagnosis not present

## 2014-09-15 DIAGNOSIS — E785 Hyperlipidemia, unspecified: Secondary | ICD-10-CM | POA: Diagnosis not present

## 2014-09-15 DIAGNOSIS — I619 Nontraumatic intracerebral hemorrhage, unspecified: Secondary | ICD-10-CM | POA: Diagnosis not present

## 2014-09-15 DIAGNOSIS — Z794 Long term (current) use of insulin: Secondary | ICD-10-CM | POA: Diagnosis not present

## 2014-09-15 LAB — GLUCOSE, CAPILLARY
Glucose-Capillary: 196 mg/dL — ABNORMAL HIGH (ref 70–99)
Glucose-Capillary: 230 mg/dL — ABNORMAL HIGH (ref 70–99)
Glucose-Capillary: 129 mg/dL — ABNORMAL HIGH (ref 70–99)

## 2014-09-15 MED ORDER — ROPINIROLE HCL 0.25 MG PO TABS
0.2500 mg | ORAL_TABLET | Freq: Every day | ORAL | Status: DC
  Administered 2014-09-15: 0.25 mg via ORAL
  Filled 2014-09-15 (×2): qty 1

## 2014-09-15 MED ORDER — STROKE: EARLY STAGES OF RECOVERY BOOK
Freq: Once | Status: DC
  Filled 2014-09-15: qty 1

## 2014-09-15 MED ORDER — SENNOSIDES-DOCUSATE SODIUM 8.6-50 MG PO TABS
2.0000 | ORAL_TABLET | Freq: Two times a day (BID) | ORAL | Status: DC
  Administered 2014-09-15 – 2014-09-16 (×3): 2 via ORAL
  Filled 2014-09-15 (×4): qty 2

## 2014-09-15 NOTE — Clinical Social Work Note (Addendum)
CSW informed that patient is no longer CIR appropriate- CSW following for SNF placement.  CSW spoke with daughter at bedside- they are agreeable to SNF- patient has heard of [facility] and hopes to be placed there- no bed offers at this time.  Domenica Reamer, [place] Social Worker [phone]

## 2014-09-15 NOTE — Progress Notes (Signed)
[facility] PROGRESS NOTE  Christy Simmons MWN:[id number] DOB: 1934-08-21 DOA: 09/13/2014 PCP: Leonides Sake, MD  Assessment/Plan: 1-Chest pain: heart score 5. -troponin neg X2 -EKG and telemetry w/o acute ischemic changes -doubt pain is cardiac related -will continue metoprolol, statins and continue aspirin  -patient with recent 2-D echo demonstrating no wall motion abnormalities and preserved EF -she has positive bilateral DVT and D-dimer was ordered and elevated > 20; but she is status post IVC filter placement and do not present SOB or tachycardia; also due to recent ICH unable to use anticoagulation. Will hold on CTA at this moment. Decision discussed/informed to family. -pain is atypical; but patient with significant risk factors (heart score 5) -will continue patient on protonix BID and continue robaxin/valium for muscle spasm -cymbalta for neuropathy started on 2/15  2-Malnutrition of moderate degree: will continue using glucerna TID -dietician consulted -consider remeron for sleep and appetite  3-patient with recent right stroke of basal ganglia: who during revascularization transformed into ICH (intracerebral hemorrhage) at the putamen. -per neurology ok to continue ASA for now and then in 5 days (which would be around 2/16-2/17) repeat CT head; if bleeding subsided plan would be for eliquis 2.5mg  BID, Ct head ordered on 2/16 -patient with positive PFO and bilateral DVT  4-Essential hypertension: stable.  -Will continue metoprolol  5-uncontrolled Diabetes: A1C 12 -will continue sensitive SSI -continue lantus -use modified carbohydrates diet -adjust insulin therapy as needed base on CBG's  6-HLD (hyperlipidemia): continue statins  7-GERD (gastroesophageal reflux disease): will continue PPI  8-DVT of lower extremity, bilateral: s/p IVC filter due to inability for anticoagulation with recent [place] -patient complaining of pain -will continue pain meds as  needed  9-left hemiparesis/dysphagia: residual effect from CVA -continue dysphagia 1 and honey thick liquids -was actively receiving rehabilitation prior to admission; will involve PT/OT back  10-neuropathy: appears to be a residual from recent stroke -will start patient on cymbalta  11-thrush: will use nystatin  Code Status: DNR Family Communication: daughter at bedside Disposition Plan:snf placement   Consultants:  None   Procedures:  See below for x-ray reports   Antibiotics:  None   HPI/Subjective: C/o restless leg, c/o insomnia, constipation. Patient endorses pain her legs and epigastric region. She had associated muscle spasm/shaquing (especially upper extremities) with pain.  Objective: Filed Vitals:   09/15/14 1430  BP: 95/46  Pulse:   Temp:   Resp:     Intake/Output Summary (Last 24 hours) at 09/15/14 1650 Last data filed at 09/15/14 1230  Gross per 24 hour  Intake    350 ml  Output    500 ml  Net   -150 ml   Filed Weights   09/13/14 1800  Weight: 57.5 kg (126 lb 12.2 oz)    Exam:   General:  Complaining of LE pain and muscle spasm in her upper extremities. No fever, no cough and no SOB.  Cardiovascular: S1 and S2, no rubs or gallops  Respiratory: CTA bilaterally  Abdomen: soft, some epigastric discomfort on palpation, no distension and hepatomegaly   Musculoskeletal: no joint swelling and no erythema  Neurologic: no new deficit; continue to have residual left hemiparesis (affecting LLE > LUE); patient pain in legs and associated muscle spasm  Data Reviewed: Basic Metabolic Panel:  Recent Labs Lab 09/11/14 0615 09/13/14 1846  NA 138 136  K 4.3 4.5  CL 109 106  CO2 22 22  GLUCOSE 111* 142*  BUN 11 12  CREATININE 0.69 0.77  CALCIUM 8.8  9.2   Liver Function Tests:  Recent Labs Lab 09/11/14 0615  AST 28  ALT 15  ALKPHOS 59  BILITOT 0.8  PROT 4.6*  ALBUMIN 2.0*    Recent Labs Lab 09/14/14 0040  LIPASE 21    CBC:  Recent Labs Lab 09/11/14 0615 09/13/14 1846  WBC 7.6 8.6  NEUTROABS 5.0 5.7  HGB 9.4* 9.3*  HCT 27.9* 28.1*  MCV 87.2 86.7  PLT 211 187   Cardiac Enzymes:  Recent Labs Lab 09/12/14 0405 09/13/14 1846 09/14/14 0040 09/14/14 0755  CKTOTAL  --  19  --   --   CKMB  --  1.3  --   --   TROPONINI 0.08*  --  0.03 <0.03   BNP (last 3 results) No results for input(s): BNP in the last 8760 hours.  ProBNP (last 3 results) No results for input(s): PROBNP in the last 8760 hours.  CBG:  Recent Labs Lab 09/14/14 1122 09/14/14 1611 09/14/14 2114 09/15/14 1111 09/15/14 1616  GLUCAP 154* 101* 190* 196* 230*    No results found for this or any previous visit (from the past 240 hour(s)).   Studies: Dg Chest 2 View  09/13/2014   CLINICAL DATA:  Chest pain  EXAM: CHEST  2 VIEW  COMPARISON:  September 05, 2014  FINDINGS: Endotracheal tube no longer present. No pneumothorax. Lungs are clear. Heart size and pulmonary vascularity are normal. No adenopathy. There is evidence of a prior fracture of the left clavicle, healed. No acute rib fracture or other rib lesion is identified on this study. Bones do appear somewhat osteoporotic.  IMPRESSION: No edema or consolidation. No pneumothorax. Healed rib fracture left clavicle. No acute fracture elsewhere.   Electronically Signed   By: Lowella Grip III M.D.   On: 09/13/2014 21:27    Scheduled Meds: .  stroke: mapping our early stages of recovery book   Does not apply Once  . aspirin EC  81 mg Oral Daily  . atorvastatin  20 mg Oral q1800  . brimonidine  1 drop Both Eyes BID  . calcium-vitamin D  1 tablet Oral Q breakfast  . cholecalciferol  1,000 Units Oral Daily  . DULoxetine  30 mg Oral Daily  . feeding supplement (ENSURE COMPLETE)  237 mL Oral BID BM  . feeding supplement (GLUCERNA SHAKE)  237 mL Oral TID BM  . insulin aspart  0-9 Units Subcutaneous TID WC  . insulin glargine  10 Units Subcutaneous Daily  . magnesium oxide   400 mg Oral Daily  . metoprolol tartrate  12.5 mg Oral BID  . multivitamin with minerals  1 tablet Oral Daily  . nystatin  5 mL Oral QID  . pantoprazole  40 mg Oral BID  . rOPINIRole  0.25 mg Oral QHS  . senna-docusate  2 tablet Oral BID   Continuous Infusions: . sodium chloride 50 mL/hr at 09/14/14 2000    Principal Problem:   Chest pain Active Problems:   Malnutrition of moderate degree   ICH (intracerebral hemorrhage)   Essential hypertension   Diabetes   HLD (hyperlipidemia)   GERD (gastroesophageal reflux disease)   DVT of lower extremity, bilateral    Time spent: 30 minutes   Kaytlyn Din  [facility] Pager [phone]. If 7PM-7AM, please contact night-coverage at [email or web address], password [facility] 09/15/2014, 4:50 PM  LOS: 2 days

## 2014-09-15 NOTE — Progress Notes (Signed)
I met with pt and her daughter, Barnetta Chapel, at bedside. I rieterated my conversation with pt and her husband at bedside yesterday offering SNF level due to pt and family expressing the need for a slower paced program. Pt initially stated she didn't want SNF, but then expressed that she would consider. Daughter expressed goals to be better pain management, improved nutritional support, less intense therapy, and better sleep during the night. I discussed with RN CM. Would recommend SNF at this time [phone]

## 2014-09-15 NOTE — Progress Notes (Signed)
Pt picked up from [employer]. Christy Simmons 3:34 PM

## 2014-09-15 NOTE — Evaluation (Signed)
Occupational Therapy Evaluation Patient Details Name: Christy Simmons MRN: [id number] DOB: [birth year] Today's Date: 09/15/2014    History of Present Illness 80 y.o. female admitted with chest pain from inpatient rehabilitation. History of recent right stroke of basal ganglia: who during revascularization transformed into ICH (intracerebral hemorrhage) at the putamen, with resulting left hemiplegia.   Clinical Impression   Prior to CVA, pt was independent in her self care and mobility.  Pt presents with pain in her neck and R LE, but was readily willing to participate in evaluation.  Pt demonstrates generalized weakness, impaired balance in sitting and standing, impaired cognition, and ataxia interfering with ability to perform ADL and ADL transfers.  Pt's family voicing concern with pain management and poor oral intake interfering with her ability to fully benefit from therapy.  Will follow acutely.    Follow Up Recommendations  CIR    Equipment Recommendations  3 in 1 bedside comode;Tub/shower bench    Recommendations for Other Services       Precautions / Restrictions Precautions Precautions: Fall Precaution Comments: L hemiparesis, ataxia Restrictions Weight Bearing Restrictions: No      Mobility Bed Mobility Overal bed mobility: Needs Assistance Bed Mobility: Rolling;Sidelying to Sit;Sit to Supine Rolling: Mod assist Sidelying to sit: Mod assist   Sit to supine: Max assist   General bed mobility comments: got up to L side of bed, verbal cues for sequencing, physical assist for trunk and LEs  Transfers Overall transfer level: Needs assistance   Transfers: Stand Pivot Transfers;Sit to/from Stand Sit to Stand: Mod assist Stand pivot transfers: Mod assist       General transfer comment: assist to rise and for stability, cues for technique and hand placement    Balance Overall balance assessment: Needs assistance Sitting-balance support: Feet supported;Single  extremity supported Sitting balance-Leahy Scale: Poor       Standing balance-Leahy Scale: Poor                              ADL Overall ADL's : Needs assistance/impaired Eating/Feeding: Moderate assistance Eating/Feeding Details (indicate cue type and reason): assist to load spoon, brought it to her mouth,pt with poor appetite Grooming: Moderate assistance;Wash/dry hands;Wash/dry face;Brushing hair;Sitting (supported sitting) Grooming Details (indicate cue type and reason): able to use both hands for bilateral tasks Upper Body Bathing: Maximal assistance;Sitting   Lower Body Bathing: Total assistance;+2 for physical assistance;Sit to/from stand Lower Body Bathing Details (indicate cue type and reason): requires one person to support pt in standing, while the second performs Upper Body Dressing : Moderate assistance;Sitting (supported sitting)   Lower Body Dressing: +2 for physical assistance;Total assistance;Sit to/from stand   Toilet Transfer: Moderate assistance;Stand-pivot   Toileting- Clothing Manipulation and Hygiene: +2 for physical assistance;Total assistance         General ADL Comments: sat EOB x 5 minutes with L hand on rail and min guard assist     Vision Vision Assessment?: No apparent visual deficits   Perception     Praxis      Pertinent Vitals/Pain Pain Assessment: 0-10 Pain Score: 8  Pain Location: R LE, neck Pain Descriptors / Indicators: Aching Pain Intervention(s): Limited activity within patient's tolerance;Monitored during session;Premedicated before session;Repositioned     Hand Dominance Right   Extremity/Trunk Assessment Upper Extremity Assessment Upper Extremity Assessment: RUE deficits/detail;LUE deficits/detail RUE Deficits / Details: generalized weakness, full AROM LUE Deficits / Details: [place] 4-5 LUE Coordination: decreased fine motor;decreased gross  motor   Lower Extremity Assessment Lower Extremity Assessment:  Defer to PT evaluation       Communication Communication Communication: No difficulties   Cognition Arousal/Alertness: Awake/alert Behavior During Therapy: Flat affect Overall Cognitive Status: Impaired/Different from baseline Area of Impairment: Following commands     Memory: Decreased short-term memory Following Commands: Follows one step commands with increased time       General Comments: eyes open, participating in conversation about best discharge disposition   General Comments       Exercises       Shoulder Instructions      Home Living Family/patient expects to be discharged to:: Inpatient rehab Living Arrangements: Other relatives;Spouse/significant other;Children Available Help at Discharge: Family;Available 24 hours/day Type of Home: House Home Access: Stairs to enter [employer] of Steps: 3 with no rail and 7 with R rail  Entrance Stairs-Rails: None Home Layout: One level     Bathroom Shower/Tub: Walk-in shower;Tub/shower unit;Curtain;Door   [employer] Toilet: Standard Bathroom Accessibility: Yes How Accessible: Accessible via walker Home Equipment: Walker - 2 wheels;Cane - single point;Bedside commode   Additional Comments: tub/shower combo with curtain in most used bathroom and walk in shower in other bathroom  Lives With: Spouse;Daughter    Prior Functioning/Environment Level of Independence: Independent (prior to admission)             OT Diagnosis: Generalized weakness;Cognitive deficits;Acute pain;Hemiplegia non-dominant side   OT Problem List: Decreased strength;Decreased range of motion;Decreased activity tolerance;Impaired balance (sitting and/or standing);Decreased coordination;Decreased cognition;Decreased knowledge of use of DME or AE;Impaired UE functional use;Pain;Impaired sensation   OT Treatment/Interventions: Self-care/ADL training;Neuromuscular education;DME and/or AE instruction;Cognitive  remediation/compensation;Therapeutic activities;Balance training;Patient/family education;Therapeutic exercise    OT Goals(Current goals can be found in the care plan section) Acute Rehab OT Goals Patient Stated Goal: Go back to rehab OT Goal Formulation: With patient Time For Goal Achievement: 09/29/14 Potential to Achieve Goals: Good ADL Goals Pt Will Perform Eating: with supervision;sitting Pt Will Perform Grooming: with min assist;sitting Pt Will Perform Upper Body Bathing: with min assist;sitting Pt Will Perform Upper Body Dressing: with min assist;sitting Pt Will Transfer to Toilet: with min assist;stand pivot transfer;bedside commode Pt/caregiver will Perform Home Exercise Program: Both right and left upper extremity (AROM B UE with caregiver's assist) Additional ADL Goal #1: Pt will sit unsupported at EOB x 5 minutes as a precursor to ADL.  OT Frequency: Min 3X/week   Barriers to D/C:            Co-evaluation              End of Session Equipment Utilized During Treatment: Gait belt  Activity Tolerance: Patient limited by pain Patient left: in bed;with call bell/phone within reach;with family/visitor present   Time: 0828-0902 OT Time Calculation (min): 34 min Charges:  OT General Charges $OT Visit: 1 Procedure OT Evaluation $Initial OT Evaluation Tier I: 1 Procedure OT Treatments $Neuromuscular Re-education: 8-22 mins G-Codes:    Malka So 09/15/2014, 9:35 AM

## 2014-09-16 DIAGNOSIS — I82431 Acute embolism and thrombosis of right popliteal vein: Secondary | ICD-10-CM | POA: Diagnosis not present

## 2014-09-16 DIAGNOSIS — R1312 Dysphagia, oropharyngeal phase: Secondary | ICD-10-CM | POA: Diagnosis not present

## 2014-09-16 DIAGNOSIS — R600 Localized edema: Secondary | ICD-10-CM | POA: Diagnosis not present

## 2014-09-16 DIAGNOSIS — I619 Nontraumatic intracerebral hemorrhage, unspecified: Secondary | ICD-10-CM | POA: Diagnosis not present

## 2014-09-16 DIAGNOSIS — Z7982 Long term (current) use of aspirin: Secondary | ICD-10-CM | POA: Diagnosis not present

## 2014-09-16 DIAGNOSIS — F329 Major depressive disorder, single episode, unspecified: Secondary | ICD-10-CM | POA: Diagnosis not present

## 2014-09-16 DIAGNOSIS — R079 Chest pain, unspecified: Secondary | ICD-10-CM | POA: Diagnosis not present

## 2014-09-16 DIAGNOSIS — Z86718 Personal history of other venous thrombosis and embolism: Secondary | ICD-10-CM | POA: Diagnosis not present

## 2014-09-16 DIAGNOSIS — I82503 Chronic embolism and thrombosis of unspecified deep veins of lower extremity, bilateral: Secondary | ICD-10-CM | POA: Diagnosis not present

## 2014-09-16 DIAGNOSIS — R5381 Other malaise: Secondary | ICD-10-CM | POA: Diagnosis not present

## 2014-09-16 DIAGNOSIS — E785 Hyperlipidemia, unspecified: Secondary | ICD-10-CM | POA: Diagnosis not present

## 2014-09-16 DIAGNOSIS — R279 Unspecified lack of coordination: Secondary | ICD-10-CM | POA: Diagnosis not present

## 2014-09-16 DIAGNOSIS — H269 Unspecified cataract: Secondary | ICD-10-CM | POA: Diagnosis not present

## 2014-09-16 DIAGNOSIS — Q211 Atrial septal defect: Secondary | ICD-10-CM | POA: Diagnosis not present

## 2014-09-16 DIAGNOSIS — R2681 Unsteadiness on feet: Secondary | ICD-10-CM | POA: Diagnosis not present

## 2014-09-16 DIAGNOSIS — Z794 Long term (current) use of insulin: Secondary | ICD-10-CM

## 2014-09-16 DIAGNOSIS — I69154 Hemiplegia and hemiparesis following nontraumatic intracerebral hemorrhage affecting left non-dominant side: Secondary | ICD-10-CM | POA: Diagnosis not present

## 2014-09-16 DIAGNOSIS — I639 Cerebral infarction, unspecified: Secondary | ICD-10-CM | POA: Diagnosis not present

## 2014-09-16 DIAGNOSIS — E1142 Type 2 diabetes mellitus with diabetic polyneuropathy: Secondary | ICD-10-CM | POA: Diagnosis not present

## 2014-09-16 DIAGNOSIS — Z888 Allergy status to other drugs, medicaments and biological substances status: Secondary | ICD-10-CM | POA: Diagnosis not present

## 2014-09-16 DIAGNOSIS — K219 Gastro-esophageal reflux disease without esophagitis: Secondary | ICD-10-CM | POA: Diagnosis not present

## 2014-09-16 DIAGNOSIS — Z79899 Other long term (current) drug therapy: Secondary | ICD-10-CM | POA: Diagnosis not present

## 2014-09-16 DIAGNOSIS — E440 Moderate protein-calorie malnutrition: Secondary | ICD-10-CM | POA: Diagnosis not present

## 2014-09-16 DIAGNOSIS — I69354 Hemiplegia and hemiparesis following cerebral infarction affecting left non-dominant side: Secondary | ICD-10-CM | POA: Diagnosis not present

## 2014-09-16 DIAGNOSIS — Z8673 Personal history of transient ischemic attack (TIA), and cerebral infarction without residual deficits: Secondary | ICD-10-CM | POA: Diagnosis not present

## 2014-09-16 DIAGNOSIS — R1310 Dysphagia, unspecified: Secondary | ICD-10-CM | POA: Diagnosis not present

## 2014-09-16 DIAGNOSIS — I69391 Dysphagia following cerebral infarction: Secondary | ICD-10-CM | POA: Diagnosis not present

## 2014-09-16 DIAGNOSIS — M6281 Muscle weakness (generalized): Secondary | ICD-10-CM | POA: Diagnosis not present

## 2014-09-16 DIAGNOSIS — G8190 Hemiplegia, unspecified affecting unspecified side: Secondary | ICD-10-CM

## 2014-09-16 DIAGNOSIS — I610 Nontraumatic intracerebral hemorrhage in hemisphere, subcortical: Secondary | ICD-10-CM | POA: Diagnosis not present

## 2014-09-16 DIAGNOSIS — B379 Candidiasis, unspecified: Secondary | ICD-10-CM | POA: Diagnosis not present

## 2014-09-16 DIAGNOSIS — R2689 Other abnormalities of gait and mobility: Secondary | ICD-10-CM | POA: Diagnosis not present

## 2014-09-16 DIAGNOSIS — I82442 Acute embolism and thrombosis of left tibial vein: Secondary | ICD-10-CM | POA: Diagnosis not present

## 2014-09-16 DIAGNOSIS — E1139 Type 2 diabetes mellitus with other diabetic ophthalmic complication: Secondary | ICD-10-CM | POA: Diagnosis not present

## 2014-09-16 DIAGNOSIS — G894 Chronic pain syndrome: Secondary | ICD-10-CM | POA: Diagnosis not present

## 2014-09-16 DIAGNOSIS — I82403 Acute embolism and thrombosis of unspecified deep veins of lower extremity, bilateral: Secondary | ICD-10-CM | POA: Diagnosis not present

## 2014-09-16 DIAGNOSIS — Z885 Allergy status to narcotic agent status: Secondary | ICD-10-CM | POA: Diagnosis not present

## 2014-09-16 DIAGNOSIS — I10 Essential (primary) hypertension: Secondary | ICD-10-CM | POA: Diagnosis not present

## 2014-09-16 DIAGNOSIS — E119 Type 2 diabetes mellitus without complications: Secondary | ICD-10-CM

## 2014-09-16 LAB — GLUCOSE, CAPILLARY
Glucose-Capillary: 138 mg/dL — ABNORMAL HIGH (ref 70–99)
Glucose-Capillary: 151 mg/dL — ABNORMAL HIGH (ref 70–99)

## 2014-09-16 LAB — MYOGLOBIN, SERUM: Myoglobin: 34 mcg/L — ABNORMAL HIGH (ref <=30)

## 2014-09-16 MED ORDER — APIXABAN 2.5 MG PO TABS: 2.5000 mg | ORAL_TABLET | Freq: Two times a day (BID) | ORAL | Status: AC

## 2014-09-16 MED ORDER — LISINOPRIL 2.5 MG PO TABS: 5.0000 mg | ORAL_TABLET | Freq: Every day | ORAL | Status: DC

## 2014-09-16 MED ORDER — TRAMADOL HCL 50 MG PO TABS: 50.0000 mg | ORAL_TABLET | Freq: Two times a day (BID) | ORAL | Status: DC | PRN

## 2014-09-16 MED ORDER — GLUCERNA SHAKE PO LIQD: 237.0000 mL | Freq: Three times a day (TID) | ORAL | Status: DC

## 2014-09-16 MED ORDER — SENNOSIDES-DOCUSATE SODIUM 8.6-50 MG PO TABS: 2.0000 | ORAL_TABLET | Freq: Two times a day (BID) | ORAL | Status: DC

## 2014-09-16 MED ORDER — OXYCODONE-ACETAMINOPHEN 5-325 MG PO TABS: 1.0000 | ORAL_TABLET | ORAL | Status: DC | PRN

## 2014-09-16 MED ORDER — ATORVASTATIN CALCIUM 20 MG PO TABS: 20.0000 mg | ORAL_TABLET | Freq: Every day | ORAL | Status: DC

## 2014-09-16 MED ORDER — INSULIN ASPART 100 UNIT/ML ~~LOC~~ SOLN: 0.0000 [IU] | Freq: Three times a day (TID) | SUBCUTANEOUS | Status: AC

## 2014-09-16 MED ORDER — ROPINIROLE HCL 0.25 MG PO TABS: 0.2500 mg | ORAL_TABLET | Freq: Every day | ORAL | Status: DC

## 2014-09-16 MED ORDER — NYSTATIN 100000 UNIT/ML MT SUSP: 5.0000 mL | Freq: Four times a day (QID) | OROMUCOSAL | Status: DC

## 2014-09-16 NOTE — Clinical Social Work Placement (Signed)
Clinical Social Work Department CLINICAL SOCIAL WORK PLACEMENT NOTE 09/16/2014  Patient:  OVIE, EASTEP  Account Number:  [id number] Admit date:  09/13/2014  Clinical Social Worker:  Domenica Reamer, CLINICAL SOCIAL WORKER  Date/time:  09/15/2014 11:59 AM  Clinical Social Work is seeking post-discharge placement for this patient at the following level of care:   SKILLED NURSING   (*CSW will update this form in Epic as items are completed)   09/15/2014  Patient/family provided with [place] Department of Clinical Social Work's list of facilities offering this level of care within the geographic area requested by the patient (or if unable, by the patient's family).  09/15/2014  Patient/family informed of their freedom to choose among providers that offer the needed level of care, that participate in Medicare, Medicaid or managed care program needed by the patient, have an available bed and are willing to accept the patient.  09/15/2014  Patient/family informed of [facility]' ownership interest in [facility], as well as of the fact that they are under no obligation to receive care at this facility.  PASARR submitted to EDS on 09/15/2014 PASARR number received on 09/15/2014  FL2 transmitted to all facilities in geographic area requested by pt/family on  09/15/2014 FL2 transmitted to all facilities within larger geographic area on   Patient informed that his/her managed care company has contracts with or will negotiate with  certain facilities, including the following:     Patient/family informed of bed offers received:  09/16/2014 Patient chooses bed at Other Physician recommends and patient chooses bed at    Patient to be transferred to Other on  09/16/2014 Patient to be transferred to facility by [facility] Patient and family notified of transfer on 09/16/2014 Name of family member notified:  george  The following physician request were entered in Epic:   Additional  Comments: Patient will go to [facility] by [employer]. Domenica Reamer, [place] Social Worker [phone]

## 2014-09-16 NOTE — Progress Notes (Addendum)
Physical Therapy Treatment Patient Details Name: Christy Simmons MRN: [id number] DOB: October 01, 1934 Today's Date: 09/16/2014    History of Present Illness 80 y.o. female admitted with chest pain from inpatient rehabilitation. History of recent right stroke of basal ganglia: who during revascularization transformed into ICH (intracerebral hemorrhage) at the putamen, with resulting left hemiplegia.    PT Comments    Making slow but steady progress towards physical therapy goals. Tolerated standing with RW and min-mod assist x2 with focus on balance control and endurance. Very lethargic today needing frequent cues to remain awake during therapeutic exercises. Patient will continue to benefit from skilled physical therapy services to further improve independence with functional mobility. Discharge recommended for SNF as pt not a candidate for CIR.   Follow Up Recommendations  SNF     Equipment Recommendations  None recommended by PT    Recommendations for Other Services       Precautions / Restrictions Precautions Precautions: Fall Precaution Comments: L hemiparesis, ataxia Restrictions Weight Bearing Restrictions: No    Mobility  Bed Mobility               General bed mobility comments: In chair at start of therapy  Transfers Overall transfer level: Needs assistance Equipment used: Rolling walker (2 wheeled) Transfers: Sit to/from Stand Sit to Stand: Mod assist         General transfer comment: Mod assist for boost to stand from reclining chair with cues for hands on arm rest. Performed x2. Able to scoot forwards and backwards with min assist for trunk support. Requires increased time for all mobility in chair.  Ambulation/Gait                 Stairs            Wheelchair Mobility    Modified Rankin (Stroke Patients Only)       Balance     Sitting balance-Leahy Scale: Poor   Postural control: Left lateral lean Standing balance support: Single  extremity supported Standing balance-Leahy Scale: Poor Standing balance comment: Practiced standing balance with tasks for weight shifting towards her right side. Frequent cues and min -mod assist for upright posture. Unable to tolerate lifting Rt LE, but can lift LLE. Tolerated first bout of standing x 2 minutes. Approx 1 min on second trial with marching task.                    Cognition Arousal/Alertness: Lethargic;Suspect due to medications Behavior During Therapy: Flat affect Overall Cognitive Status: Impaired/Different from baseline Area of Impairment: Following commands       Following Commands: Follows one step commands consistently       General Comments: Needed frequent cues to remain awake during therapy session.    Exercises General Exercises - Lower Extremity Ankle Circles/Pumps: AAROM;Both;10 reps;Seated Quad Sets: Strengthening;Both;10 reps;Seated Long Arc Quad: AAROM;Strengthening;Both;10 reps;Seated Hip ABduction/ADduction: AAROM;Strengthening;Both;10 reps;Seated Hip Flexion/Marching: AAROM;Strengthening;Both;10 reps;Seated    General Comments General comments (skin integrity, edema, etc.): Very lethargic today.      Pertinent Vitals/Pain Pain Assessment: 0-10 Pain Score:  ("my legs hurt" no value given.) Pain Location: BIL LEs Pain Intervention(s): Limited activity within patient's tolerance;Monitored during session;Premedicated before session;Repositioned    Home Living                      Prior Function            PT Goals (current goals can now be found in the care  plan section) Acute Rehab PT Goals Patient Stated Goal: Go back to rehab PT Goal Formulation: With patient/family Time For Goal Achievement: 09/28/14 Potential to Achieve Goals: Good Progress towards PT goals: Progressing toward goals    Frequency  Min 3X/week    PT Plan Discharge plan needs to be updated    Co-evaluation             End of Session  Equipment Utilized During Treatment: Gait belt Activity Tolerance: Patient limited by lethargy;Patient limited by fatigue Patient left: in chair;with call bell/phone within reach;with family/visitor present     Time: 5093-2671 PT Time Calculation (min) (ACUTE ONLY): 31 min  Charges:  $Therapeutic Exercise: 8-22 mins $Therapeutic Activity: 8-22 mins                    G Codes:      Ellouise Newer 10/01/14, 1:35 PM Camille Bal Gage, [place]

## 2014-09-16 NOTE — Progress Notes (Signed)
Report called to [facility] manager at [facility]. afleming RN

## 2014-09-16 NOTE — Clinical Social Work Note (Signed)
Patient will discharge to [facility] Anticipated discharge date:09/16/14 Family notified: at bedside  Transportation by [facility]- called at 2pm  CSW signing off.  Domenica Reamer, [place] Social Worker [phone]

## 2014-09-16 NOTE — Progress Notes (Signed)
Patient discharged to [facility] via [facility]. Patient's information packet given to transport personnel. Report already called to [facility]. RN. afleming RN

## 2014-09-16 NOTE — Clinical Social Work Psychosocial (Signed)
Clinical Social Work Department BRIEF PSYCHOSOCIAL ASSESSMENT 09/16/2014  Patient:  Christy Simmons, Christy Simmons     Account Number:  [id number]     Admit date:  09/13/2014  Clinical Social Worker:  Domenica Reamer, [place]  Date/Time:  09/15/2014 11:55 AM  Referred by:  Physician  Date Referred:  09/15/2014 Referred for  SNF Placement   Other Referral:   Interview type:  Patient Other interview type:   also spoke with patient daughter and husband    PSYCHOSOCIAL DATA Living Status:  HUSBAND Admitted from facility:   Level of care:   Primary support name:  Iona Beard Primary support relationship to patient:  SPOUSE Degree of support available:   high level of support from family    CURRENT CONCERNS Current Concerns  Post-Acute Placement   Other Concerns:    SOCIAL WORK ASSESSMENT / PLAN CSW spoke with patient concerning SNF placement- patient was in [facility] but pt daughter reports that the high level of activity was too much for the patient and she became weaker as a result. Pt daughter is very much in favor of SNF placement for a lower level of rehab.  Patient is agreeable and states that she has heard great things about [facility] and would like to go there if she needs to go.   Assessment/plan status:  Psychosocial Support/Ongoing Assessment of Needs Other assessment/ plan:   FL2  PASAR   Information/referral to community resources:    PATIENT'S/FAMILY'S RESPONSE TO PLAN OF CARE: Patients family is agreeable to SNF stay and are hopeful that the patient will recover very quickly and be able to come back home at a baseline level of activity.

## 2014-09-16 NOTE — Discharge Summary (Signed)
Discharge Summary  Christy Simmons NOM:[id number] DOB: 1934/08/19  PCP: Leonides Sake, MD  Admit date: 09/13/2014 Discharge date: 09/16/2014  Time spent: >1mins  Recommendations for Outpatient Follow-up:  1. F/u with neurology, newly started on eliquis 2. F/u with pmd for further bp and diabetic control. Discharge Diagnoses:  Active Hospital Problems   Diagnosis Date Noted  . Chest pain 09/13/2014  . Essential hypertension 09/13/2014  . Diabetes 09/13/2014  . HLD (hyperlipidemia) 09/13/2014  . GERD (gastroesophageal reflux disease) 09/13/2014  . DVT of lower extremity, bilateral 09/13/2014  . ICH (intracerebral hemorrhage)   . Malnutrition of moderate degree 09/07/2014    Resolved Hospital Problems   Diagnosis Date Noted Date Resolved  No resolved problems to display.    Discharge Condition: stable  Diet recommendation: heart health/carb modified Diet Recommendations: Dysphagia 1 (Puree);Honey-thick liquid Liquid Administration via: Cup;No straw Medication Administration: Crushed with puree Supervision: Patient able to self feed;Full supervision/cueing for compensatory strategies Compensations: Slow rate;Small sips/bites Postural Changes and/or Swallow Maneuvers: Seated upright 90 degrees;Upright 30-60 min after meal Oral Care Recommendations: Oral care BID  Filed Weights   09/13/14 1800  Weight: 57.5 kg (126 lb 12.2 oz)    History of present illness:  Christy Simmons is a 80 y.o. female with PMH significant for HTN, HLD, diabetes, hx of PFO, h/o dvt s/p ivc filter on 2/10 ; who was recently admitted secondary to acute right side stroke with conversion to [place] with revascularization (2/6). Patient was at inpatient rehab when suddenly developed excruciating pain in the left side of her chest. Pain has been present for approx 2.5 hours, mainly in her epigastric region and radiated to left rib cage; no sweating, no nausea, no vomiting and SOB. Patient received NTG X 2 w/o  relieved. TR[facility] called to see patient and admit her for further evaluation and treatment.  Hospital Course:  Principal Problem:   Chest pain Active Problems:   Malnutrition of moderate degree   ICH (intracerebral hemorrhage)   Essential hypertension   Diabetes   HLD (hyperlipidemia)   GERD (gastroesophageal reflux disease)   DVT of lower extremity, bilateral  1-Chest pain: heart score 5. -troponin neg X2 -EKG and telemetry w/o acute ischemic changes -doubt pain is cardiac related -patient with recent 2-D echo demonstrating no wall motion abnormalities and preserved EF -she has positive bilateral DVT and D-dimer was ordered and elevated > 20; but she is status post IVC filter placement on 09/09/2014  and do not present SOB or tachycardia; also due to recent ICH unable to use anticoagulation. Will hold on CTA at this moment. Decision discussed/informed to family. -pain is atypical; but patient with significant risk factors (heart score 5) -will continue patient on protonix BID and continue robaxin/valium for muscle spasm -no c/o of chest pain at discharge  2-Malnutrition of moderate degree: will continue using glucerna TID -dietician consulted   3-patient with recent right stroke of basal ganglia: who during revascularization  On 09/05/2014 transformed into ICH (intracerebral hemorrhage) at the putamen. -per neurology ok to continue ASA for now and then in 5 days (which would be around 2/16-2/17) repeat CT head; if bleeding subsided plan would be for eliquis 2.5mg  BID and d/c asa ,  -patient with positive PFO and bilateral DVT -Ct head ordered on 2/16 no new hemorrhage, prior hemorrhage resolving. eliquis started per neuro recommendation, asa stopped, patient is to discharge to snf, and outpatient f/u with neurology  4-Essential hypertension: stable.  -metoprolol stopped due to concerning for  bradycardia -low dose lisinopril started at discharge, further bp meds adjustment per  pmd.  5-uncontrolled Diabetes: A1C 12 -will continue sensitive SSI -continue lantus -use modified carbohydrates diet -adjust insulin therapy as needed base on CBG's  6-HLD (hyperlipidemia): continue statins  7-GERD (gastroesophageal reflux disease): will continue PPI  8-DVT of lower extremity, bilateral: s/p IVC filter 09/09/2014 due to inability for anticoagulation with recent [place] -patient complaining of pain -will continue pain meds as needed -eliquis started at discharge --restless leg ? requip started, monitor effect  9-left hemiparesis/dysphagia: residual effect from CVA -continue dysphagia 1 and honey thick liquids -was actively receiving rehabilitation prior to admission; to snf to continue PT/OT/speech, patient not a candidate for intensive inpatient rehab due to overall frailty  10-neuropathy: appears to be a residual from recent stroke -requip started  11-thrush: topical nystatin, oral care  Code Status: DNR Family Communication: daughter at bedside Disposition Plan:snf placement   Consultants:  None  Procedures:  See below for x-ray reports  Antibiotics:  None  Discharge Exam: BP 112/55 mmHg  Pulse 76  Temp(Src) 98.2 F (36.8 C) (Oral)  Resp 18  Ht 5\' 5"  (1.651 m)  Wt 57.5 kg (126 lb 12.2 oz)  BMI 21.09 kg/m2  SpO2 93%   General: Complaining of LE pain and muscle spasm in her upper extremities. No fever, no cough and no SOB.  Cardiovascular: S1 and S2, no rubs or gallops  Respiratory: CTA bilaterally  Abdomen: soft, some epigastric discomfort on palpation, no distension and hepatomegaly   Musculoskeletal: no joint swelling and no erythema  Neurologic: no new deficit; continue to have residual left hemiparesis (affecting LLE > LUE); patient pain in legs and associated muscle spasm     Discharge Instructions You were cared for by a hospitalist during your hospital stay. If you have any questions about your discharge medications or the  care you received while you were in the hospital after you are discharged, you can call the unit and asked to speak with the hospitalist on call if the hospitalist that took care of you is not available. Once you are discharged, your primary care physician will handle any further medical issues. Please note that NO REFILLS for any discharge medications will be authorized once you are discharged, as it is imperative that you return to your primary care physician (or establish a relationship with a primary care physician if you do not have one) for your aftercare needs so that they can reassess your need for medications and monitor your lab values.  Discharge Instructions    Diet - low sodium heart healthy    Complete by:  As directed      Increase activity slowly    Complete by:  As directed             Medication List    STOP taking these medications        aspirin EC 81 MG tablet     diazepam 5 MG tablet  Commonly known as:  VALIUM     ibuprofen 200 MG tablet  Commonly known as:  ADVIL,MOTRIN     insulin lispro 100 UNIT/ML injection  Commonly known as:  HUMALOG     lisinopril-hydrochlorothiazide 20-25 MG per tablet  Commonly known as:  PRINZIDE,ZESTORETIC     metoCLOPramide 5 MG tablet  Commonly known as:  REGLAN      TAKE these medications        apixaban 2.5 MG Tabs tablet  Commonly known as:  ELIQUIS  Take 1 tablet (2.5 mg total) by mouth 2 (two) times daily.     atorvastatin 20 MG tablet  Commonly known as:  LIPITOR  Take 1 tablet (20 mg total) by mouth daily at 6 PM.     brimonidine 0.2 % ophthalmic solution  Commonly known as:  ALPHAGAN  Place 1 drop into both eyes 2 (two) times daily.     Calcium Citrate-Vitamin D 250-200 MG-UNIT Tabs  Take 1 tablet by mouth daily.     esomeprazole 20 MG capsule  Commonly known as:  NEXIUM  Take 20 mg by mouth daily.     feeding supplement (GLUCERNA SHAKE) Liqd  Take 237 mLs by mouth 3 (three) times daily between meals.      insulin aspart 100 UNIT/ML injection  Commonly known as:  novoLOG  Inject 0-9 Units into the skin 3 (three) times daily with meals.     insulin glargine 100 UNIT/ML injection  Commonly known as:  LANTUS  Inject 20 Units into the skin daily.     lisinopril 2.5 MG tablet  Commonly known as:  ZESTRIL  Take 2 tablets (5 mg total) by mouth daily.     Magnesium 200 MG Tabs  Take 400 mg by mouth daily.     multivitamin with minerals Tabs tablet  Take 1 tablet by mouth daily.     nystatin 100000 UNIT/ML suspension  Commonly known as:  MYCOSTATIN  Take 5 mLs (500,000 Units total) by mouth 4 (four) times daily.     OSTEO BI-FLEX ADV JOINT SHIELD Tabs  Take 1 tablet by mouth daily.     oxyCODONE-acetaminophen 5-325 MG per tablet  Commonly known as:  PERCOCET/ROXICET  Take 1-2 tablets by mouth every 4 (four) hours as needed for moderate pain.     rOPINIRole 0.25 MG tablet  Commonly known as:  REQUIP  Take 1 tablet (0.25 mg total) by mouth at bedtime.     senna-docusate 8.6-50 MG per tablet  Commonly known as:  Senokot-S  Take 2 tablets by mouth 2 (two) times daily.     traMADol 50 MG tablet  Commonly known as:  ULTRAM  Take 50-100 mg by mouth 2 (two) times daily as needed for moderate pain.     Vitamin D3 1000 UNITS Caps  Take 1,000 Units by mouth daily.       Allergies  Allergen Reactions  . Gabapentin Other (See Comments)    Caused depression  . Codeine     Headache       Follow-up Information    Follow up with [facility], MD In 2 weeks.   Specialty:  Family Medicine   Contact information:   Dr. Daiva Eves [address] [place] [address] [phone]       Follow up with Lenor Coffin, MD In 1 week.   Specialty:  Neurology   Contact information:   [address] [place] [address] [phone]        The results of significant diagnostics from this hospitalization (including imaging, microbiology,  ancillary and laboratory) are listed below for reference.    Significant Diagnostic Studies: Ct Angio Head W/cm &/or Wo Cm  09/05/2014   CLINICAL DATA:  Left-sided paralysis.  Acute stroke.  EXAM: CT ANGIOGRAPHY HEAD AND NECK  CT CEREBRAL PERFUSION  TECHNIQUE: Multidetector CT imaging of the head and neck was performed using the standard protocol during bolus administration of intravenous contrast. Multiplanar CT image reconstructions and MIPs  were obtained to evaluate the vascular anatomy. Carotid stenosis measurements (when applicable) are obtained utilizing NASCET criteria, using the distal internal carotid diameter as the denominator. CT cerebral perfusion was performed during bolus IV contrast injection.  CONTRAST:  165mL OMNIPAQUE IOHEXOL 350 MG/ML SOLN  COMPARISON:  Head CT earlier today. Head MRI/ MRA 01/27/2013. Neck MRA 10/13/2011.  FINDINGS: CTA NECK  Aortic arch: 3 vessel aortic arch. The brachiocephalic and subclavian arteries are patent without stenosis.  Right carotid system: Mild, predominantly noncalcified plaque is present at the right carotid bifurcation. No common carotid or cervical internal carotid artery stenosis.  Left carotid system: Mild calcified and noncalcified plaque at the carotid bifurcation without common carotid or cervical internal carotid artery stenosis.  Vertebral arteries:Vertebral arteries are patent without stenosis. Left vertebral artery is mildly dominant.  Skeleton: Moderate to severe multilevel cervical disc degeneration.  Other neck: The thyroid gland is heterogeneous and diffusely enlarged, more so in the right lobe. Multiple nodules are present. Dominant right-sided nodule measures 3.0 cm. There is a 2.0 cm nodule in the isthmus.  CTA HEAD  Anterior circulation: Internal carotid arteries are patent from skullbase to carotid termini. There is mild carotid siphon calcification bilaterally without significant stenosis. There is a distal M1 occlusion on the right.  Contrast is seen within M2 and more distal MCA branch vessels, likely via collateral flow, however the number of MCA branch vessels is mildly diminished compared to the contralateral side. Left MCA and bilateral ACAs are unremarkable. No intracranial aneurysm is identified.  Posterior circulation: The intracranial vertebral arteries are patent to the basilar with the left being dominant. PICA origins and SCA origins are patent. Basilar artery is patent without stenosis. There are patent posterior communicating arteries bilaterally, right larger than left, and the right P1 segment is mildly hypoplastic. PCAs are otherwise unremarkable.  Venous sinuses: Patent.  Anatomic variants: Hypoplastic right P1.  Delayed phase: No abnormal brain parenchymal or meningeal enhancement. Asymmetric enhancement of right MCA branch vessels may reflect slow flow and collateral flow. Subtle asymmetric hypoattenuation in the right corona radiata and external capsule may reflect developing acute infarct.  CT PERFUSION  Cerebral perfusion images demonstrate abnormally prolonged mean transit time and time to peak throughout essentially the entirety of the right MCA territory with corresponding decreased cerebral blood flow. Cerebral blood volume is maintained throughout much of this region, with some cortical/subcortical regions demonstrating slightly elevated CBV compared to the contralateral side. The deep white matter of the right corona radiata and centrum semiovale, particularly posteriorly, as well as posterior temporal lobe demonstrate more abnormal MTT and CBF parameters than the remainder of the MCA territory as well as slightly diminished CBV compared to the contralateral side and may reflect a developing core infarct in a deep watershed distribution. The basal ganglia are largely spared.  IMPRESSION: 1. Distal right M1 occlusion. 2. Abnormal perfusion throughout essentially the entirety of the right MCA territory. Much of this  appears to represent oligemic brain with relatively preserved cerebral blood volume, however there is likely a developing region of core infarct involving the deep white matter.  Preliminary results were discussed in person at the time of interpretation on 09/05/2014 at 10:45 am with Dr. Dorian Pod , who verbally acknowledged these results.   Electronically Signed   By: Logan Bores   On: 09/05/2014 11:35   Dg Chest 2 View  09/13/2014   CLINICAL DATA:  Chest pain  EXAM: CHEST  2 VIEW  COMPARISON:  September 05, 2014  FINDINGS: Endotracheal tube no longer present. No pneumothorax. Lungs are clear. Heart size and pulmonary vascularity are normal. No adenopathy. There is evidence of a prior fracture of the left clavicle, healed. No acute rib fracture or other rib lesion is identified on this study. Bones do appear somewhat osteoporotic.  IMPRESSION: No edema or consolidation. No pneumothorax. Healed rib fracture left clavicle. No acute fracture elsewhere.   Electronically Signed   By: Lowella Grip III M.D.   On: 09/13/2014 21:27   Ct Head Wo Contrast  09/15/2014   CLINICAL DATA:  Subsequent evaluation for stroke several days ago, still having left-sided weakness and no appetite  EXAM: CT HEAD WITHOUT CONTRAST  TECHNIQUE: Contiguous axial images were obtained from the base of the skull through the vertex without intravenous contrast.  COMPARISON:  09/06/2014 MRI, 09/09/2014 CT scan  FINDINGS: Moderate to severe atrophy. Low attenuation in the deep white matter. Multiple sub cm foci of more pronounced low attenuation in the deep periventricular white matter consistent with known lacunar infarcts. No evidence of large vascular territory infarct. Areas of known embolic infarction throughout the cerebellum as seen on recent MRI are subtle but visible by CT scan as on the prior CT. Further evolution of right putaminal hemorrhagic conversion of infarct with minimal residual hemorrhagic components identified. No  hemorrhage or extra-axial fluid. No hydrocephalus or mass effect.  Left sphenoid sinus is opacified. Remainder of the visualized paranasal sinuses clear. Calvarium intact.  IMPRESSION: Patient with known bilateral multifocal embolic infarcts, with known hemorrhagic conversion of infarction right putaminal. Minimal visible hemorrhage currently identified. No significant interval change from CT scan performed 09/09/2014 otherwise.   Electronically Signed   By: Skipper Cliche M.D.   On: 09/15/2014 18:52   Ct Head Wo Contrast  09/09/2014   CLINICAL DATA:  Followup intracranial hemorrhage.  EXAM: CT HEAD WITHOUT CONTRAST  TECHNIQUE: Contiguous axial images were obtained from the base of the skull through the vertex without intravenous contrast.  COMPARISON:  09/05/2014  FINDINGS: Skull and Sinuses:Progressive opacification of the left sphenoid sinus, likely related or exacerbated by a nasogastric tube that was previously noted  Orbits: No acute abnormality.  Brain: Many of the recently demonstrated acute infarcts have become visible due to cytotoxic edema, with cm or smaller infarcts noted in the bilateral cerebellum, bilateral centrum semiovale and subcortical white matter. The largest infarct, located in the right putamen, is again noted with unchanged amorphous hemorrhage measuring up to 13 mm. There is no evidence of new infarct. No hydrocephalus, mass lesion, or shift.  IMPRESSION: 1. Expected evolution of embolic cerebral and cerebellar infarcts. Hemorrhagic conversion in the right putamen is stable; no evidence of new ischemia. 2. Progressive left sphenoid sinusitis, likely related to prior nasogastric tube.   Electronically Signed   By: Monte Fantasia M.D.   On: 09/09/2014 08:22   Ct Head Wo Contrast  09/05/2014   CLINICAL DATA:  Stroke. Left-sided weakness. Right MCA occlusion status post cerebral angiogram and revascularization.  EXAM: CT HEAD WITHOUT CONTRAST  TECHNIQUE: Contiguous axial images were  obtained from the base of the skull through the vertex without intravenous contrast.  COMPARISON:  Head CT earlier today  FINDINGS: There is some residual intravascular contrast present from recent catheter angiogram. There is mild contrast staining in the right basal ganglia. Subtly asymmetric hypoattenuation in the posterior right centrum semiovale and corona radiata is unchanged and could reflect early acute ischemic change or asymmetric chronic ischemia. Patchy hypodensities more anteriorly  in the centrum semiovale/ corona radiata bilaterally are also unchanged and appear more chronic in nature. No definite acute large territory cortical infarct is identified. There is no evidence of acute intracranial hemorrhage, mass, midline shift, or extra-axial fluid collection. Mild generalized cerebral atrophy is noted.  Orbits are unremarkable. Visualized mastoid air cells are clear. Left sphenoid sinus fluid has mildly increased. Endotracheal and enteric tubes are partially visualized.  IMPRESSION: Sequelae of interval cerebral angiography and right MCA revascularization. No acute hemorrhage.   Electronically Signed   By: Logan Bores   On: 09/05/2014 14:41   Ct Head Wo Contrast  09/05/2014   CLINICAL DATA:  Left-sided weakness beginning earlier this morning. Post fall.  EXAM: CT HEAD WITHOUT CONTRAST  CT CERVICAL SPINE WITHOUT CONTRAST  TECHNIQUE: Multidetector CT imaging of the head and cervical spine was performed following the standard protocol without intravenous contrast. Multiplanar CT image reconstructions of the cervical spine were also generated.  COMPARISON:  Head CT - 12/18/2012; brain MRI - 01/27/2013; thyroid ultrasound - 08/17/2010  FINDINGS: CT HEAD FINDINGS  Examination is minimally degraded due to obliquity. Interval development of an ill-defined area of hypoattenuation within the bilateral centrum semiovale, right greater than left (representative images 18 through 20, series 201). These areas  appear at least subacute given lack of adjacent vasogenic edema. The gray-white differentiation is otherwise well maintained. Similar findings of atrophy with bifrontal sulcal prominence. Unchanged size and configuration of the ventricles and basilar cisterns given obliquity. No midline shift. Intracranial atherosclerosis.  There is minimal mucosal thickening within the bilateral sphenoid sinuses. The remaining paranasal sinuses mastoid air cells are normally aerated. No air-fluid levels. Regional soft tissues appear normal. No displaced calvarial fracture.  CT CERVICAL SPINE FINDINGS  C1 to the superior endplate of T3 is imaged.  Normal alignment of the cervical spine given the head being held in a minimal amount of left lateral flexion. No anterolisthesis or retrolisthesis. The bilateral facets are normally aligned. The dens is normally positioned between the lateral masses of C1. Moderate degenerative change of the atlantodental articulation  Cervical vertebral body heights are preserved. Prevertebral soft tissues are normal.  There is moderate to severe multilevel cervical spine DDD, worse at C4-C5, C5-C6 and C6-C7 with disc space height loss, endplate irregularity and posteriorly directed disc osteophyte complexes at these locations.  There is asymmetric enlargement of the right lobe of the thyroid and the thyroid isthmus with note made of an approximately 1.1 cm hyper attenuating nodule within the posterior aspect the right lobe of the thyroid (image 65, series 302) as well as an approximately 2.0 x 1.8 cm nodule within the thyroid isthmus (image 78).  Regional soft tissues appear otherwise normal. Limited visualization of lung apices is normal.  IMPRESSION: 1. Interval development of ill-defined hypo densities within the bilateral centrum semiovale, right greater than left, favored to represent progressive age-indeterminate though at least subacute microvascular ischemic disease continued since remote  examination performed 11/2012. Otherwise, no definite acute intracranial process. 2. No fracture or static subluxation of the cervical spine. 3. Moderate to severe multilevel cervical spine DDD, worse at C4-C5, C5-C6 and C6-C7. 4. Asymmetric enlargement of the right lobe of the thyroid and thyroid isthmus with suspected thyroid nodules as detailed above. While these nodules appear grossly similar to compared to the 07/2010 thyroid ultrasound, further evaluation could be performed with repeat dedicated non emergent thyroid ultrasound as clinically indicated. Above findings discussed with Dr. Aram Beecham at 02/2012.   Electronically Signed  By: Sandi Mariscal M.D.   On: 09/05/2014 08:39   Ct Angio Neck W/cm &/or Wo/cm  09/05/2014   CLINICAL DATA:  Left-sided paralysis.  Acute stroke.  EXAM: CT ANGIOGRAPHY HEAD AND NECK  CT CEREBRAL PERFUSION  TECHNIQUE: Multidetector CT imaging of the head and neck was performed using the standard protocol during bolus administration of intravenous contrast. Multiplanar CT image reconstructions and MIPs were obtained to evaluate the vascular anatomy. Carotid stenosis measurements (when applicable) are obtained utilizing NASCET criteria, using the distal internal carotid diameter as the denominator. CT cerebral perfusion was performed during bolus IV contrast injection.  CONTRAST:  175mL OMNIPAQUE IOHEXOL 350 MG/ML SOLN  COMPARISON:  Head CT earlier today. Head MRI/ MRA 01/27/2013. Neck MRA 10/13/2011.  FINDINGS: CTA NECK  Aortic arch: 3 vessel aortic arch. The brachiocephalic and subclavian arteries are patent without stenosis.  Right carotid system: Mild, predominantly noncalcified plaque is present at the right carotid bifurcation. No common carotid or cervical internal carotid artery stenosis.  Left carotid system: Mild calcified and noncalcified plaque at the carotid bifurcation without common carotid or cervical internal carotid artery stenosis.  Vertebral arteries:Vertebral arteries  are patent without stenosis. Left vertebral artery is mildly dominant.  Skeleton: Moderate to severe multilevel cervical disc degeneration.  Other neck: The thyroid gland is heterogeneous and diffusely enlarged, more so in the right lobe. Multiple nodules are present. Dominant right-sided nodule measures 3.0 cm. There is a 2.0 cm nodule in the isthmus.  CTA HEAD  Anterior circulation: Internal carotid arteries are patent from skullbase to carotid termini. There is mild carotid siphon calcification bilaterally without significant stenosis. There is a distal M1 occlusion on the right. Contrast is seen within M2 and more distal MCA branch vessels, likely via collateral flow, however the number of MCA branch vessels is mildly diminished compared to the contralateral side. Left MCA and bilateral ACAs are unremarkable. No intracranial aneurysm is identified.  Posterior circulation: The intracranial vertebral arteries are patent to the basilar with the left being dominant. PICA origins and SCA origins are patent. Basilar artery is patent without stenosis. There are patent posterior communicating arteries bilaterally, right larger than left, and the right P1 segment is mildly hypoplastic. PCAs are otherwise unremarkable.  Venous sinuses: Patent.  Anatomic variants: Hypoplastic right P1.  Delayed phase: No abnormal brain parenchymal or meningeal enhancement. Asymmetric enhancement of right MCA branch vessels may reflect slow flow and collateral flow. Subtle asymmetric hypoattenuation in the right corona radiata and external capsule may reflect developing acute infarct.  CT PERFUSION  Cerebral perfusion images demonstrate abnormally prolonged mean transit time and time to peak throughout essentially the entirety of the right MCA territory with corresponding decreased cerebral blood flow. Cerebral blood volume is maintained throughout much of this region, with some cortical/subcortical regions demonstrating slightly elevated  CBV compared to the contralateral side. The deep white matter of the right corona radiata and centrum semiovale, particularly posteriorly, as well as posterior temporal lobe demonstrate more abnormal MTT and CBF parameters than the remainder of the MCA territory as well as slightly diminished CBV compared to the contralateral side and may reflect a developing core infarct in a deep watershed distribution. The basal ganglia are largely spared.  IMPRESSION: 1. Distal right M1 occlusion. 2. Abnormal perfusion throughout essentially the entirety of the right MCA territory. Much of this appears to represent oligemic brain with relatively preserved cerebral blood volume, however there is likely a developing region of core infarct involving the deep white  matter.  Preliminary results were discussed in person at the time of interpretation on 09/05/2014 at 10:45 am with Dr. Dorian Pod , who verbally acknowledged these results.   Electronically Signed   By: Logan Bores   On: 09/05/2014 11:35   Ct Cervical Spine Wo Contrast  09/05/2014   CLINICAL DATA:  Left-sided weakness beginning earlier this morning. Post fall.  EXAM: CT HEAD WITHOUT CONTRAST  CT CERVICAL SPINE WITHOUT CONTRAST  TECHNIQUE: Multidetector CT imaging of the head and cervical spine was performed following the standard protocol without intravenous contrast. Multiplanar CT image reconstructions of the cervical spine were also generated.  COMPARISON:  Head CT - 12/18/2012; brain MRI - 01/27/2013; thyroid ultrasound - 08/17/2010  FINDINGS: CT HEAD FINDINGS  Examination is minimally degraded due to obliquity. Interval development of an ill-defined area of hypoattenuation within the bilateral centrum semiovale, right greater than left (representative images 18 through 20, series 201). These areas appear at least subacute given lack of adjacent vasogenic edema. The gray-white differentiation is otherwise well maintained. Similar findings of atrophy with  bifrontal sulcal prominence. Unchanged size and configuration of the ventricles and basilar cisterns given obliquity. No midline shift. Intracranial atherosclerosis.  There is minimal mucosal thickening within the bilateral sphenoid sinuses. The remaining paranasal sinuses mastoid air cells are normally aerated. No air-fluid levels. Regional soft tissues appear normal. No displaced calvarial fracture.  CT CERVICAL SPINE FINDINGS  C1 to the superior endplate of T3 is imaged.  Normal alignment of the cervical spine given the head being held in a minimal amount of left lateral flexion. No anterolisthesis or retrolisthesis. The bilateral facets are normally aligned. The dens is normally positioned between the lateral masses of C1. Moderate degenerative change of the atlantodental articulation  Cervical vertebral body heights are preserved. Prevertebral soft tissues are normal.  There is moderate to severe multilevel cervical spine DDD, worse at C4-C5, C5-C6 and C6-C7 with disc space height loss, endplate irregularity and posteriorly directed disc osteophyte complexes at these locations.  There is asymmetric enlargement of the right lobe of the thyroid and the thyroid isthmus with note made of an approximately 1.1 cm hyper attenuating nodule within the posterior aspect the right lobe of the thyroid (image 65, series 302) as well as an approximately 2.0 x 1.8 cm nodule within the thyroid isthmus (image 78).  Regional soft tissues appear otherwise normal. Limited visualization of lung apices is normal.  IMPRESSION: 1. Interval development of ill-defined hypo densities within the bilateral centrum semiovale, right greater than left, favored to represent progressive age-indeterminate though at least subacute microvascular ischemic disease continued since remote examination performed 11/2012. Otherwise, no definite acute intracranial process. 2. No fracture or static subluxation of the cervical spine. 3. Moderate to severe  multilevel cervical spine DDD, worse at C4-C5, C5-C6 and C6-C7. 4. Asymmetric enlargement of the right lobe of the thyroid and thyroid isthmus with suspected thyroid nodules as detailed above. While these nodules appear grossly similar to compared to the 07/2010 thyroid ultrasound, further evaluation could be performed with repeat dedicated non emergent thyroid ultrasound as clinically indicated. Above findings discussed with Dr. Aram Beecham at 02/2012.   Electronically Signed   By: Sandi Mariscal M.D.   On: 09/05/2014 08:39   Mr Brain Wo Contrast  09/06/2014   CLINICAL DATA:  Stroke. Right MCA occlusion with clot removal 09/05/2014.  EXAM: MRI HEAD WITHOUT CONTRAST  TECHNIQUE: Multiplanar, multiecho pulse sequences of the brain and surrounding structures were obtained without intravenous contrast.  COMPARISON:  CT 09/05/2014  FINDINGS: Multiple areas of acute infarct are identified. Scattered areas of acute infarct in the right MCA territory involving the right insula, right posterior basal ganglia, and right parietal lobe. Mild amount of hemorrhage in the right posterior putamen.  Small areas of acute infarct in the left parietal cortex. Acute infarct in the PICA territory bilaterally. Small area of acute infarct in the left occipital lobe.  This pattern suggests cerebral emboli.  Ventricle size is normal. No shift of the midline structures. Negative for mass lesion.  Air-fluid level in the sphenoid sinus. Maxillary sinuses are clear bilaterally.  IMPRESSION: Multiple areas of acute infarct are present indicative of cerebral emboli.  Postop re- canalization of the right MCA with a small amount of hemorrhage in the right putamen. There are scattered areas of infarct involving the right insula and right parietal lobe however there is no lobar right MCA infarct.   Electronically Signed   By: Franchot Gallo M.D.   On: 09/06/2014 18:33   Ir Ivc Filter Plmt / S&i /img Guid/mod Sed  09/09/2014   INDICATION: 80 year old  with hemorrhage stroke and lower extremity DVT. Patient is not a candidate for anticoagulation at this time. IVC filter needed for pulmonary embolism prophylaxis.  EXAM: IVC FILTER PLACEMENT; IVC VENOGRAM; ULTRASOUND FOR VASCULAR ACCESS  Physician: Stephan Minister. Anselm Pancoast, MD  FLUOROSCOPY TIME:  1 minutes and 54 seconds, 113.8 mGy  MEDICATIONS: None  ANESTHESIA/SEDATION: Moderate sedation time: None  CONTRAST:  40 mL Omnipaque 300  PROCEDURE: Informed consent was obtained for an IVC venogram and filter placement. Ultrasound demonstrated a patent right internal jugular vein. Ultrasound images were obtained for documentation. The right side of the neck was prepped and draped in a sterile fashion. Maximal barrier sterile technique was utilized including caps, mask, sterile gowns, sterile gloves, sterile drape, hand hygiene and skin antiseptic. The skin was anesthetized with 1% lidocaine. A 21 gauge needle was directed into the vein with ultrasound guidance and a micropuncture dilator set was placed. A wire was advanced into the IVC. The filter sheath was advanced over the wire into the IVC. An IVC venogram was performed. Fluoroscopic images were obtained for documentation. A Cook Celect filter was deployed below the lowest renal vein. A follow-up venogram was performed and the vascular sheath was removed with manual compression.  FINDINGS: IVC was patent. Bilateral renal veins were identified. The filter was deployed below the lowest renal vein. Follow-up venogram confirmed placement within the IVC and below the renal veins.  COMPLICATIONS: None  IMPRESSION: Successful placement of a retrievable IVC filter.  This IVC filter is potentially retrievable. The patient will be assessed for filter retrieval by Interventional Radiology in approximately 8-12 weeks. Further recommendations regarding filter retrieval, continued surveillance or declaration of device permanence, will be made at that time.   Electronically Signed   By: Markus Daft M.D.   On: 09/09/2014 16:44   Dg Pelvis Portable  09/05/2014   CLINICAL DATA:  Fall with right hip pain.  EXAM: PORTABLE PELVIS 1-2 VIEWS  COMPARISON:  12/24/2012  FINDINGS: Note that the very lateral aspect of the right trochanteric region is not imaged. There are mild symmetric degenerative changes of the hips unchanged. No evidence of acute fracture or dislocation. There are degenerative changes of the spine. There is mild fecal retention over the rectum.  IMPRESSION: No acute fracture or dislocation. Note that the lateral aspect of the trochanteric region of the right femur is not completely imaged.  Recommend an additional cone-down view of the right hip for complete evaluation.   Electronically Signed   By: Marin Olp M.D.   On: 09/05/2014 10:57   Ct Cerebral Perfusion W/cm  09/05/2014   CLINICAL DATA:  Left-sided paralysis.  Acute stroke.  EXAM: CT ANGIOGRAPHY HEAD AND NECK  CT CEREBRAL PERFUSION  TECHNIQUE: Multidetector CT imaging of the head and neck was performed using the standard protocol during bolus administration of intravenous contrast. Multiplanar CT image reconstructions and MIPs were obtained to evaluate the vascular anatomy. Carotid stenosis measurements (when applicable) are obtained utilizing NASCET criteria, using the distal internal carotid diameter as the denominator. CT cerebral perfusion was performed during bolus IV contrast injection.  CONTRAST:  182mL OMNIPAQUE IOHEXOL 350 MG/ML SOLN  COMPARISON:  Head CT earlier today. Head MRI/ MRA 01/27/2013. Neck MRA 10/13/2011.  FINDINGS: CTA NECK  Aortic arch: 3 vessel aortic arch. The brachiocephalic and subclavian arteries are patent without stenosis.  Right carotid system: Mild, predominantly noncalcified plaque is present at the right carotid bifurcation. No common carotid or cervical internal carotid artery stenosis.  Left carotid system: Mild calcified and noncalcified plaque at the carotid bifurcation without common carotid or  cervical internal carotid artery stenosis.  Vertebral arteries:Vertebral arteries are patent without stenosis. Left vertebral artery is mildly dominant.  Skeleton: Moderate to severe multilevel cervical disc degeneration.  Other neck: The thyroid gland is heterogeneous and diffusely enlarged, more so in the right lobe. Multiple nodules are present. Dominant right-sided nodule measures 3.0 cm. There is a 2.0 cm nodule in the isthmus.  CTA HEAD  Anterior circulation: Internal carotid arteries are patent from skullbase to carotid termini. There is mild carotid siphon calcification bilaterally without significant stenosis. There is a distal M1 occlusion on the right. Contrast is seen within M2 and more distal MCA branch vessels, likely via collateral flow, however the number of MCA branch vessels is mildly diminished compared to the contralateral side. Left MCA and bilateral ACAs are unremarkable. No intracranial aneurysm is identified.  Posterior circulation: The intracranial vertebral arteries are patent to the basilar with the left being dominant. PICA origins and SCA origins are patent. Basilar artery is patent without stenosis. There are patent posterior communicating arteries bilaterally, right larger than left, and the right P1 segment is mildly hypoplastic. PCAs are otherwise unremarkable.  Venous sinuses: Patent.  Anatomic variants: Hypoplastic right P1.  Delayed phase: No abnormal brain parenchymal or meningeal enhancement. Asymmetric enhancement of right MCA branch vessels may reflect slow flow and collateral flow. Subtle asymmetric hypoattenuation in the right corona radiata and external capsule may reflect developing acute infarct.  CT PERFUSION  Cerebral perfusion images demonstrate abnormally prolonged mean transit time and time to peak throughout essentially the entirety of the right MCA territory with corresponding decreased cerebral blood flow. Cerebral blood volume is maintained throughout much of  this region, with some cortical/subcortical regions demonstrating slightly elevated CBV compared to the contralateral side. The deep white matter of the right corona radiata and centrum semiovale, particularly posteriorly, as well as posterior temporal lobe demonstrate more abnormal MTT and CBF parameters than the remainder of the MCA territory as well as slightly diminished CBV compared to the contralateral side and may reflect a developing core infarct in a deep watershed distribution. The basal ganglia are largely spared.  IMPRESSION: 1. Distal right M1 occlusion. 2. Abnormal perfusion throughout essentially the entirety of the right MCA territory. Much of this appears to represent oligemic brain with relatively preserved cerebral  blood volume, however there is likely a developing region of core infarct involving the deep white matter.  Preliminary results were discussed in person at the time of interpretation on 09/05/2014 at 10:45 am with Dr. Dorian Pod , who verbally acknowledged these results.   Electronically Signed   By: Logan Bores   On: 09/05/2014 11:35   Dg Chest Port 1 View  09/05/2014   CLINICAL DATA:  Code stroke. History of TIA, hypertension and diabetes.  EXAM: PORTABLE CHEST - 1 VIEW  COMPARISON:  08/14/2013; 10/13/2011  FINDINGS: Grossly unchanged cardiac silhouette and mediastinal contours. No focal parenchymal opacities. No pleural effusion or pneumothorax. No evidence of edema. No acute osseus abnormalities.  IMPRESSION: No acute cardiopulmonary disease.   Electronically Signed   By: Sandi Mariscal M.D.   On: 09/05/2014 08:53   Dg Chest Port 1v Same Day  09/05/2014   CLINICAL DATA:  Respiratory failure.  EXAM: PORTABLE CHEST - 1 VIEW SAME DAY  COMPARISON:  09/05/2014 at 0838 hr.  FINDINGS: Endotracheal tube is in satisfactory position, terminating 5.1 cm above the carina. Nasogastric tube is followed into the stomach. Lungs are clear. A skin fold projects over the upper left hemi thorax. No  pleural fluid.  IMPRESSION: No acute findings.   Electronically Signed   By: Lorin Picket M.D.   On: 09/05/2014 19:00   Dg Swallowing Func-speech Pathology  09/08/2014   Orbie Pyo [place], CCC-SLP     09/08/2014 12:26 PM  Objective Swallowing Evaluation: Modified Barium Swallowing Study   Patient Details  Name: Christy Simmons MRN: [id number] Date of Birth: 1935/02/25  Today's Date: 09/08/2014 Time: SLP Start Time (ACUTE ONLY): 1045-SLP Stop Time (ACUTE  ONLY): 1105 SLP Time Calculation (min) (ACUTE ONLY): 20 min  Past Medical History:  Past Medical History  Diagnosis Date  . Transient ischemic attack   . HTN (hypertension)   . Hyperlipidemia   . DM (diabetes mellitus)   . GERD (gastroesophageal reflux disease)   . Heart disease     PFO  . Amnesia 01/10/2013  . Patent foramen ovale with atrial septal aneurysm   . Complication of anesthesia   . PONV (postoperative nausea and vomiting)   . Family history of anesthesia complication     "My brother has trouble waking up."  . Glaucoma     Hx: of  . Early cataracts, bilateral     Hx: of  . Arthritis    Past Surgical History:  Past Surgical History  Procedure Laterality Date  . US echocardiography  10-13-11    EF 60-65% normal  . Back surgery      disc   . Tonsillectomy    . Tee without cardioversion  10/30/2011    Procedure: TRANSESOPHAGEAL ECHOCARDIOGRAM (TEE);  Surgeon:  Larey Dresser, MD;  Location: [place];  Service:  Cardiovascular;  Laterality: N/A;  . Colonoscopy w/ biopsies and polypectomy      HX;of  . Tonsillectomy    . Tubal ligation    . Lumbar laminectomy/decompression microdiscectomy N/A 09/11/2013    Procedure: LUMBAR LAMINECTOMY/DECOMPRESSION MICRODISCECTOMY 2  LEVELS;  Surgeon: Ophelia Charter, MD;  Location: [place] [facility];   Service: Neurosurgery;  Laterality: N/A;  Lumbar Four-Five  Laminectomy and Foraminotomy with redo Lumbar Five-Sacral One  diskectomy  . Radiology with anesthesia N/A 09/05/2014    Procedure: RADIOLOGY WITH ANESTHESIA;  Surgeon:  Rob Hickman, MD;  Location: [place];  Service: Radiology;   Laterality: N/A;  HPI:  HPI: 80 y.o. female with a past medical history significant for  HTN, hyperlipidemia, DM, TIA, PFO with atrial septal aneurysm  admitted with left hemiparesis and decreased responsiveness. MRI  showed Multiple areas of acute infarct are present indicative of  cerebral emboli, right MCA branch infarcts including the right  insula and basal ganglia with small hemorrhagic transformation in  the right basal ganglis. In addition, infarcts in the left  parietal lobe, bilateral PICA and left occipital lobe.  Underwent  cerebral angiogram with complete revascularization of occluded RT  MCA 2/6. Intubated 2/6-2/7. MBS recommended following bedside  swallow assessment.    No Data Recorded  Assessment / Plan / Recommendation CHL IP CLINICAL IMPRESSIONS 09/08/2014 Dysphagia Diagnosis Mild  oral phase dysphagia;Moderate oral phase dysphagia;Mild  pharyngeal phase dysphagia  Clinical impression Pt exhibits a mild-moderate oral dyspahgia  due to motor weakness leading to mild lingual residue and  decreased mandibular excursion and delayed transit with solid and  mechanical soft textures. Pharyngeal phase characterized by  primarily motor impairments of decreased laryngeal elevation and  incomplete closure with penetration of nectar barium (decreased  sensation). No significant pharyngeal residue. Pt's demonstrating  fatigue and decreased endurance throughout study increasing  aspiration risk. Throat clearing present during when barium was  not observed in laryngeal vestibule. Recommend Dys 1 diet and  honey thick liquids, full supervision, crush pills, no straws. ST  will continue intervention.       CHL IP TREATMENT RECOMMENDATION 09/08/2014  Treatment Plan Recommendations Therapy as outlined in treatment  plan below     CHL IP DIET RECOMMENDATION 09/08/2014  Diet Recommendations Dysphagia 1 (Puree);Honey-thick liquid  Liquid Administration via  Cup;No straw  Medication Administration Crushed with puree  Compensations Slow rate;Small sips/bites  Postural Changes and/or Swallow Maneuvers Seated upright 90  degrees;Upright 30-60 min after meal     CHL IP OTHER RECOMMENDATIONS 09/08/2014  Recommended Consults (None)  Oral Care Recommendations Oral care BID  Other Recommendations Order thickener from pharmacy     CHL IP FOLLOW UP RECOMMENDATIONS 09/08/2014  Follow up Recommendations (No Data)     CHL IP FREQUENCY AND DURATION 09/08/2014  Speech Therapy Frequency (ACUTE ONLY) min 2x/week  Treatment Duration 2 weeks     Pertinent Vitals/Pain Pt reported no pain since received pain  meds this morning    SLP Swallow Goals No flowsheet data found.  No flowsheet data found.    CHL IP REASON FOR REFERRAL 09/08/2014  Reason for Referral Objectively evaluate swallowing function     CHL IP ORAL PHASE 09/08/2014  Lips (None)  Tongue (None)  Mucous membranes (None)  Nutritional status (None)  Other (None)  Oxygen therapy (None)  Oral Phase Impaired  Oral - Pudding Teaspoon (None)  Oral - Pudding Cup (None)  Oral - Honey Teaspoon (None)  Oral - Honey Cup Lingual/palatal residue  Oral - Honey Syringe (None)  Oral - Nectar Teaspoon (None)  Oral - Nectar Cup (None)  Oral - Nectar Straw (None)  Oral - Nectar Syringe (None)  Oral - Ice Chips (None)  Oral - Thin Teaspoon (None)  Oral - Thin Cup (None)  Oral - Thin Straw (None)  Oral - Thin Syringe (None)  Oral - Puree (None)  Oral - Mechanical Soft Delayed oral transit;Weak lingual  manipulation  Oral - Regular Weak lingual manipulation;Delayed oral transit  Oral - Multi-consistency (None)  Oral - Pill (None)  Oral Phase - Comment (None)      CHL IP PHARYNGEAL  PHASE 09/08/2014  Pharyngeal Phase Impaired  Pharyngeal - Pudding Teaspoon (None)  Penetration/Aspiration details (pudding teaspoon) (None)  Pharyngeal - Pudding Cup (None)  Penetration/Aspiration details (pudding cup) (None) Pharyngeal -  Honey Teaspoon (None)  Penetration/Aspiration  details (honey teaspoon) (None)  Pharyngeal - Honey Cup [facility]  Penetration/Aspiration details (honey cup) (None)  Pharyngeal - Honey Syringe (None)  Penetration/Aspiration details (honey syringe) (None)  Pharyngeal - Nectar Teaspoon WFL  Penetration/Aspiration details (nectar teaspoon) (None)  Pharyngeal - Nectar Cup Penetration/Aspiration during  swallow;Reduced laryngeal elevation;Reduced airway/laryngeal  closure  Penetration/Aspiration details (nectar cup) Material enters  airway, remains ABOVE vocal cords and not ejected out  Pharyngeal - Nectar Straw (None)  Penetration/Aspiration details (nectar straw) (None)  Pharyngeal - Nectar Syringe (None)  Penetration/Aspiration details (nectar syringe) (None)  Pharyngeal - Ice Chips (None)  Penetration/Aspiration details (ice chips) (None)  Pharyngeal - Thin Teaspoon (None)  Penetration/Aspiration details (thin teaspoon) (None) Pharyngeal  - Thin Cup (None)  Penetration/Aspiration details (thin cup) (None)  Pharyngeal - Thin Straw (None)  Penetration/Aspiration details (thin straw) (None)  Pharyngeal - Thin Syringe (None)  Penetration/Aspiration details (thin syringe') (None)  Pharyngeal - Puree Delayed swallow initiation;Premature spillage  to valleculae  Penetration/Aspiration details (puree) (None)  Pharyngeal - Mechanical Soft WFL  Penetration/Aspiration details (mechanical soft) (None)  Pharyngeal - Regular WFL  Penetration/Aspiration details (regular) (None)  Pharyngeal - Multi-consistency (None)  Penetration/Aspiration details (multi-consistency) (None)  Pharyngeal - Pill (None)  Penetration/Aspiration details (pill) (None)  Pharyngeal Comment (None)     CHL IP CERVICAL ESOPHAGEAL PHASE 09/08/2014  Cervical Esophageal Phase WFL  Pudding Teaspoon (None)  Pudding Cup (None)  Honey Teaspoon (None)  Honey Cup (None)  Honey Syringe (None)  Nectar Teaspoon (None)  Nectar Cup (None)  Nectar Straw (None)  Nectar Syringe (None) Thin Teaspoon (None)  Thin Cup (None)  Thin  Straw (None)  Thin Syringe (None)  Cervical Esophageal Comment (None)    No flowsheet data found.         Houston Siren 09/08/2014, 12:24 PM   Orbie Pyo [place]vin [place].Ed [employer] Pager [phone]  Orbie Pyo [place].Ed CCC-SLP Pager [phone]        Ir Percutaneous Art Thrombectomy/infusion Intracranial Inc Diag Angio  09/08/2014   CLINICAL DATA:  Acute onset of left-sided weakness, right gaze deviation.  EXAM: RIGHT COMMON CAROTID ARTERY AND RIGHT VERTEBRAL ARTERY ARTERIOGRAMS FOLLOWED BY ENDOVASCULAR COMPLETE REVASCULARIZATION OF THE OCCLUDED RIGHT MIDDLE CEREBRAL ARTERY M1 SEGMENT USING SUPERSELECTIVE INTRACRANIAL INTRA-ARTERIAL 6 MG OF INTEGRILIN AND ONE PASS WITH THE SOLITAIRE FR STENT RETRIEVAL DEVICE. IR PERCUTANEOUS ART THORMBECTOMY/INFUSION INTRACRANIAL INCLUDE DIAG ANGIO  PROCEDURE: Contrast: 163mL OMNIPAQUE IOHEXOL 300 MG/ML  SOLN  Anesthesia/Sedation:  General anesthesia.  Medications: As per general anesthesia.  Following a full explanation of the procedure along with the potential associated complications, an informed witnessed consent was obtained from patient's husband.  The procedure, the risks of intracranial hemorrhage of 10-15%, worsening neurological deficit, need for ventilator dependency, and inability to revascularize and death were all discussed in detail with the patient's husband. Informed consent was obtained.  The patient was put under general anesthesia by the Department of Anesthesiology at [facility].  The right groin was prepped and draped in the usual sterile fashion. Thereafter using modified Seldinger technique, transfemoral access into the right common femoral artery was obtained without difficulty. Over a 0.035 inch guidewire, a 5 French Pinnacle sheath was inserted. Through this, and also over a 0.035 inch guidewire, a 5 French JB1 catheter  was advanced to the aortic arch region and selectively positioned in the innominate artery and the right common carotid  artery.  There were no acute complications. The patient tolerated the procedure well.  FINDINGS: The origin of the right vertebral artery is normal.  There is moderate narrowing of the right vertebral artery just distal to its origin, however. More distally the vessel is seen to opacify to the cranial skull base. Opacification is seen of the right vertebrobasilar junction although the right posterior-inferior cerebral artery is suboptimally visualized.  The right common carotid arteriogram demonstrates the right external carotid artery and its major branches to be normal.  The right internal carotid artery at the bulb to the cranial skull base opacifies normally.  The petrous segment, the cavernous segment and the supraclinoid segments are widely patent.  The right middle cerebral artery demonstrates a complete truncation in the mid right M1 level. The right anterior cerebral artery is seen to opacify into the capillary and venous phases.  Transient cross opacification via the anterior communicating artery of the left anterior cerebral artery A2 segment and partially the A1 segment is seen.  Additionally noted is the opacification of the left posterior cerebral artery via the left posterior communicating artery.  The delayed arterial images demonstrate retrograde opacification of the distal parietal, the parietal subcortical and the perisylvian branches filling retrogradely from the callosal marginal and the pericallosal branches.  ENDOVASCULAR REVASCULARIZATION OF OCCLUDED RIGHT MIDDLE CEREBRAL ARTERY.  The diagnostic JB1 catheter in the right common carotid artery was exchanged over a 0.035 inch 300 cm Rosen exchange guidewire for an 8 French 55 cm Brite Tip neurovascular sheath using biplane roadmap technique and constant fluoroscopic guidance. Good aspiration was obtained from the side port of the neurovascular sheath. A gentle constant injection demonstrated no evidence of spasms, dissections or of intraluminal  filling defects. This was then connected to continuous heparinized saline infusion. Over the [employer] guidewire, an 8 [place] 85 cm FlowGate balloon guide catheter which had been prepped with 50% contrast and 50% heparinized saline infusion was then advanced and positioned just proximal to the right common carotid bifurcation. The guidewire was removed. Good aspiration was obtained from the hub of the 8 French balloon guide catheter.  A gentle constant injection demonstrated no evidence of spasms, dissections or of intraluminal filling defects.  Over a 0.035 inch Roadrunner guidewire, using biplane roadmap technique and constant fluoroscopic guidance, the 8 French balloon guide catheter was then advanced to the distal cervical segment of the right internal carotid artery. The guidewire was removed. Good aspiration was obtained from the hub of the 8 French balloon guide catheter. A gentle contrast injection demonstrated no evidence of spasms, dissections or of intraluminal filling defects.  A control arteriogram performed intracranially demonstrated no change in the occluded right middle cerebral artery mid M1 segment.  At this time in a coaxial manner and with constant heparinized saline infusion using biplane roadmap technique and constant fluoroscopic guidance, a combination of a TrevoProvue micro catheter inside of a 125 cm DAC catheter was advanced over a 0.014 inch Softip Synchro micro guidewire to the distal end of the 8 French balloon guide catheter and the distal cervical right ICA. With the micro guidewire leading with a J-tip configuration, the combination was then navigated using a torque device into the supraclinoid right ICA. The micro guidewire was then gently advanced through the occluded right middle cerebral artery into the dominant inferior division of the right middle cerebral artery followed by  the micro catheter combination. The guidewire was removed. There was good aspiration from the hub  of the TrevoProvue micro catheter in the M2 M3 region of the inferior division of the right middle cerebral artery. At this juncture because of the slow distal flow, 3 mg of superselective intracranial intra-arterial Integrilin was then infused over approximately 2 minutes.  During this time the micro catheter was retrieved somewhat proximally to engage the clot itself before being readvanced over a micro guidewire to the M2 M3 region.  At this time, a 4 mm x 40 mm Solitaire FR stent retrieval device which had been prepped and purged with heparinized saline fusion in its housing was then advanced to the distal end of the micro catheter. The O rings on the delivery micro catheter and the delivery micro guidewire were then loosened. With slight forward gentle traction with the right hand on the delivery micro guidewire, with the left hand, the stent retrieval device was then unsheathed to just proximal to its proximal portion.  Once there this was left deployed for approximately 2.5 minutes. During this time, approximately 3 mg of superselective intracranial Integrilin was then infused over approximately 2 minutes through the [facility] 125 catheter.  A control arteriogram performed through the 8 French balloon guide catheter demonstrated revascularization of the previously occluded right middle cerebral artery proximally. Filling defect noted in the right MCA proximally represented the clot. The balloon was then inflated in the right internal carotid artery of the guide catheter. Using constant aspiration with a 60 mL syringe, after having retrieved the proximal portion of the retrieval device into the micro catheter, the combination of the retrieval device, and the micro catheter was then gently retrieved and removed as constant aspiration was applied at the side port at the hub of the 8 [place] FlowGate guide catheter.  The aspiration was continued as the balloon was then deflated in the right internal carotid artery. Free  back flow of blood was noted at the hub of the [employer] [place].  The aspirate demonstrated a clot measuring approximately 2.5 mm x 3 mm in the aspirate.  A control arteriogram performed through the 8 [place] FlowGate guide catheter demonstrated complete angiographic revascularization of the occluded right middle cerebral artery distribution. The right posterior communicating artery, the right posterior cerebral artery and the right anterior cerebral artery remained widely patent without evidence of distal emboli. Moderate spasm was noted at the right M1 segment, this gradually responded to 3 aliquots of 25 mics of intra-arterial nitroglycerin.  Complete relief of the spasm was achieved.  A final control arteriogram performed through the 8 [place] FlowGate guide catheter in the right internal carotid artery demonstrated complete angiographic revascularization of the right middle and the right anterior cerebral artery distributions. No evidence of intracranial filling defects, dissections or occlusions were seen.  Venous flow remained normal.  There was no extravasation of contrast noted during the procedure.  The patient's hemodynamic and neurological status remained stable.  The 8 French neurovascular sheath in the balloon occlusion FlowGate guide catheter was then retrieved into the abdominal aorta and exchanged over a J-tip guidewire for a 9 French Pinnacle sheath. This was then connected to continuous heparinized saline infusion. The patient was then transported to the CT scanner for postprocedural CT scan of the brain.  IMPRESSION: Status post endovascular complete revascularization of the occluded right middle cerebral artery M1 segment achieving a TICI 3 perfusion in the right middle cerebral artery distribution using 6 mg of superselective intracranial intra-arterial  Integrilin, and one pass with the Solitaire FR 4 mm x 40 mm stent retrieval device as described above.   Electronically Signed   By: Luanne Bras M.D.   On: 09/08/2014 09:42   Dg Hip Unilat With Pelvis 2-3 Views Right  09/05/2014   CLINICAL DATA:  80 year old female with right hip pain. Patient fell in bathroom earlier today.  EXAM: RIGHT HIP (WITH PELVIS) 2-3 VIEWS  COMPARISON:  None.  FINDINGS: No evidence of acute fracture or malalignment. The femoral head is located. The bones appear osteopenic. Foley catheter present within the bladder.  IMPRESSION: No evidence of acute fracture or malalignment   Electronically Signed   By: Jacqulynn Cadet M.D.   On: 09/05/2014 12:05    Microbiology: No results found for this or any previous visit (from the past 240 hour(s)).   Labs: Basic Metabolic Panel:  Recent Labs Lab 09/11/14 0615 09/13/14 1846  NA 138 136  K 4.3 4.5  CL 109 106  CO2 22 22  GLUCOSE 111* 142*  BUN 11 12  CREATININE 0.69 0.77  CALCIUM 8.8 9.2   Liver Function Tests:  Recent Labs Lab 09/11/14 0615  AST 28  ALT 15  ALKPHOS 59  BILITOT 0.8  PROT 4.6*  ALBUMIN 2.0*    Recent Labs Lab 09/14/14 0040  LIPASE 21   No results for input(s): AMMONIA in the last 168 hours. CBC:  Recent Labs Lab 09/11/14 0615 09/13/14 1846  WBC 7.6 8.6  NEUTROABS 5.0 5.7  HGB 9.4* 9.3*  HCT 27.9* 28.1*  MCV 87.2 86.7  PLT 211 187   Cardiac Enzymes:  Recent Labs Lab 09/12/14 0405 09/13/14 1846 09/14/14 0040 09/14/14 0755  CKTOTAL  --  19  --   --   CKMB  --  1.3  --   --   TROPONINI 0.08*  --  0.03 <0.03   BNP: BNP (last 3 results) No results for input(s): BNP in the last 8760 hours.  ProBNP (last 3 results) No results for input(s): PROBNP in the last 8760 hours.  CBG:  Recent Labs Lab 09/15/14 1111 09/15/14 1616 09/15/14 2039 09/16/14 0646 09/16/14 1110  GLUCAP 196* 230* 129* 138* 151*       Signed:  Emerson Schreifels  [facility] 09/16/2014, 12:27 PM

## 2014-09-17 ENCOUNTER — Ambulatory Visit: Payer: Self-pay | Admitting: Cardiovascular Disease

## 2014-09-17 NOTE — Progress Notes (Signed)
Late entry due to missing g-code. Sep 24, 2014 Nestor Lewandowsky, OTR/L Pager: [phone]   09-24-14 1600  OT G-codes **NOT FOR INPATIENT CLASS**  Functional Assessment Tool Used clinical judgement  Functional Limitation Self care  Self Care Current Status [phone]) CM  Self Care Goal Status ([id number]) CJ

## 2014-09-18 DIAGNOSIS — I619 Nontraumatic intracerebral hemorrhage, unspecified: Secondary | ICD-10-CM | POA: Diagnosis not present

## 2014-09-18 DIAGNOSIS — I10 Essential (primary) hypertension: Secondary | ICD-10-CM | POA: Diagnosis not present

## 2014-09-18 DIAGNOSIS — R5381 Other malaise: Secondary | ICD-10-CM | POA: Diagnosis not present

## 2014-09-18 DIAGNOSIS — E785 Hyperlipidemia, unspecified: Secondary | ICD-10-CM | POA: Diagnosis not present

## 2014-09-25 ENCOUNTER — Other Ambulatory Visit (HOSPITAL_COMMUNITY): Payer: Self-pay | Admitting: Interventional Radiology

## 2014-09-25 ENCOUNTER — Telehealth (HOSPITAL_COMMUNITY): Payer: Self-pay | Admitting: Interventional Radiology

## 2014-09-25 DIAGNOSIS — Z86718 Personal history of other venous thrombosis and embolism: Secondary | ICD-10-CM | POA: Diagnosis not present

## 2014-09-25 DIAGNOSIS — I639 Cerebral infarction, unspecified: Secondary | ICD-10-CM

## 2014-09-25 DIAGNOSIS — R600 Localized edema: Secondary | ICD-10-CM | POA: Diagnosis not present

## 2014-09-25 NOTE — Telephone Encounter (Signed)
Called pt, left VM for her to call to schedule f/u acute stroke clinical visit. JM

## 2014-09-29 ENCOUNTER — Telehealth (HOSPITAL_COMMUNITY): Payer: Self-pay | Admitting: Interventional Radiology

## 2014-09-29 DIAGNOSIS — F329 Major depressive disorder, single episode, unspecified: Secondary | ICD-10-CM | POA: Diagnosis not present

## 2014-09-29 DIAGNOSIS — G894 Chronic pain syndrome: Secondary | ICD-10-CM | POA: Diagnosis not present

## 2014-09-29 NOTE — Telephone Encounter (Signed)
Pt's husband returned my call to let me know that his wife was in a nursing facility in [place] and was still in heavy therapy. States that she is not able to walk without a lot of help still. He wants [place] to try to schedule a follow-up in or try to in another month or so. JM

## 2014-10-06 ENCOUNTER — Encounter: Payer: Self-pay | Admitting: Cardiovascular Disease

## 2014-10-06 DIAGNOSIS — I69391 Dysphagia following cerebral infarction: Secondary | ICD-10-CM | POA: Diagnosis not present

## 2014-10-06 DIAGNOSIS — I82403 Acute embolism and thrombosis of unspecified deep veins of lower extremity, bilateral: Secondary | ICD-10-CM | POA: Diagnosis not present

## 2014-10-06 DIAGNOSIS — E1139 Type 2 diabetes mellitus with other diabetic ophthalmic complication: Secondary | ICD-10-CM | POA: Diagnosis not present

## 2014-10-06 DIAGNOSIS — I69354 Hemiplegia and hemiparesis following cerebral infarction affecting left non-dominant side: Secondary | ICD-10-CM | POA: Diagnosis not present

## 2014-10-08 DIAGNOSIS — R1312 Dysphagia, oropharyngeal phase: Secondary | ICD-10-CM | POA: Diagnosis not present

## 2014-10-09 DIAGNOSIS — E119 Type 2 diabetes mellitus without complications: Secondary | ICD-10-CM | POA: Diagnosis not present

## 2014-10-09 DIAGNOSIS — Z794 Long term (current) use of insulin: Secondary | ICD-10-CM | POA: Diagnosis not present

## 2014-10-09 DIAGNOSIS — I69354 Hemiplegia and hemiparesis following cerebral infarction affecting left non-dominant side: Secondary | ICD-10-CM | POA: Diagnosis not present

## 2014-10-09 DIAGNOSIS — I10 Essential (primary) hypertension: Secondary | ICD-10-CM | POA: Diagnosis not present

## 2014-10-12 DIAGNOSIS — E119 Type 2 diabetes mellitus without complications: Secondary | ICD-10-CM | POA: Diagnosis not present

## 2014-10-12 DIAGNOSIS — Z794 Long term (current) use of insulin: Secondary | ICD-10-CM | POA: Diagnosis not present

## 2014-10-12 DIAGNOSIS — I10 Essential (primary) hypertension: Secondary | ICD-10-CM | POA: Diagnosis not present

## 2014-10-12 DIAGNOSIS — I69354 Hemiplegia and hemiparesis following cerebral infarction affecting left non-dominant side: Secondary | ICD-10-CM | POA: Diagnosis not present

## 2014-10-13 DIAGNOSIS — I69354 Hemiplegia and hemiparesis following cerebral infarction affecting left non-dominant side: Secondary | ICD-10-CM | POA: Diagnosis not present

## 2014-10-13 DIAGNOSIS — I10 Essential (primary) hypertension: Secondary | ICD-10-CM | POA: Diagnosis not present

## 2014-10-13 DIAGNOSIS — E119 Type 2 diabetes mellitus without complications: Secondary | ICD-10-CM | POA: Diagnosis not present

## 2014-10-13 DIAGNOSIS — Z794 Long term (current) use of insulin: Secondary | ICD-10-CM | POA: Diagnosis not present

## 2014-10-13 NOTE — Progress Notes (Signed)
Physical Therapy Note Addendum for:  Late G-Code Entry    Oct 09, 2014 1600  PT G-Codes **NOT FOR INPATIENT CLASS**  Functional Assessment Tool Used clinical Observation  Functional Limitation Mobility: Walking and moving around  Mobility: Walking and Moving Around Current Status [phone]) CJ  Mobility: Walking and Moving Around Goal Status [phone]) CI    Christy Simmons, [place]

## 2014-10-15 DIAGNOSIS — E119 Type 2 diabetes mellitus without complications: Secondary | ICD-10-CM | POA: Diagnosis not present

## 2014-10-15 DIAGNOSIS — I10 Essential (primary) hypertension: Secondary | ICD-10-CM | POA: Diagnosis not present

## 2014-10-15 DIAGNOSIS — I69354 Hemiplegia and hemiparesis following cerebral infarction affecting left non-dominant side: Secondary | ICD-10-CM | POA: Diagnosis not present

## 2014-10-15 DIAGNOSIS — Z794 Long term (current) use of insulin: Secondary | ICD-10-CM | POA: Diagnosis not present

## 2014-10-16 DIAGNOSIS — I69354 Hemiplegia and hemiparesis following cerebral infarction affecting left non-dominant side: Secondary | ICD-10-CM | POA: Diagnosis not present

## 2014-10-16 DIAGNOSIS — Z794 Long term (current) use of insulin: Secondary | ICD-10-CM | POA: Diagnosis not present

## 2014-10-16 DIAGNOSIS — I10 Essential (primary) hypertension: Secondary | ICD-10-CM | POA: Diagnosis not present

## 2014-10-16 DIAGNOSIS — E119 Type 2 diabetes mellitus without complications: Secondary | ICD-10-CM | POA: Diagnosis not present

## 2014-10-19 ENCOUNTER — Encounter (HOSPITAL_COMMUNITY): Payer: Self-pay | Admitting: Emergency Medicine

## 2014-10-19 ENCOUNTER — Emergency Department (HOSPITAL_COMMUNITY): Payer: Medicare Other

## 2014-10-19 ENCOUNTER — Emergency Department (HOSPITAL_COMMUNITY)
Admission: EM | Admit: 2014-10-19 | Discharge: 2014-10-20 | Disposition: A | Discharge status: Home / Self Care | Payer: Medicare Other | Attending: Emergency Medicine | Admitting: Emergency Medicine

## 2014-10-19 DIAGNOSIS — R404 Transient alteration of awareness: Secondary | ICD-10-CM | POA: Diagnosis not present

## 2014-10-19 DIAGNOSIS — H539 Unspecified visual disturbance: Secondary | ICD-10-CM | POA: Insufficient documentation

## 2014-10-19 DIAGNOSIS — Z79899 Other long term (current) drug therapy: Secondary | ICD-10-CM | POA: Diagnosis not present

## 2014-10-19 DIAGNOSIS — Z86718 Personal history of other venous thrombosis and embolism: Secondary | ICD-10-CM | POA: Diagnosis not present

## 2014-10-19 DIAGNOSIS — E785 Hyperlipidemia, unspecified: Secondary | ICD-10-CM | POA: Diagnosis not present

## 2014-10-19 DIAGNOSIS — Z8673 Personal history of transient ischemic attack (TIA), and cerebral infarction without residual deficits: Secondary | ICD-10-CM | POA: Diagnosis not present

## 2014-10-19 DIAGNOSIS — R531 Weakness: Secondary | ICD-10-CM | POA: Diagnosis not present

## 2014-10-19 DIAGNOSIS — I10 Essential (primary) hypertension: Secondary | ICD-10-CM | POA: Insufficient documentation

## 2014-10-19 DIAGNOSIS — Z8739 Personal history of other diseases of the musculoskeletal system and connective tissue: Secondary | ICD-10-CM | POA: Insufficient documentation

## 2014-10-19 DIAGNOSIS — Z794 Long term (current) use of insulin: Secondary | ICD-10-CM | POA: Diagnosis not present

## 2014-10-19 DIAGNOSIS — B9689 Other specified bacterial agents as the cause of diseases classified elsewhere: Secondary | ICD-10-CM | POA: Diagnosis not present

## 2014-10-19 DIAGNOSIS — G311 Senile degeneration of brain, not elsewhere classified: Secondary | ICD-10-CM | POA: Diagnosis not present

## 2014-10-19 DIAGNOSIS — E119 Type 2 diabetes mellitus without complications: Secondary | ICD-10-CM | POA: Diagnosis not present

## 2014-10-19 DIAGNOSIS — I69354 Hemiplegia and hemiparesis following cerebral infarction affecting left non-dominant side: Secondary | ICD-10-CM | POA: Diagnosis not present

## 2014-10-19 DIAGNOSIS — N390 Urinary tract infection, site not specified: Secondary | ICD-10-CM | POA: Insufficient documentation

## 2014-10-19 DIAGNOSIS — G453 Amaurosis fugax: Secondary | ICD-10-CM | POA: Diagnosis not present

## 2014-10-19 DIAGNOSIS — I6350 Cerebral infarction due to unspecified occlusion or stenosis of unspecified cerebral artery: Secondary | ICD-10-CM | POA: Diagnosis not present

## 2014-10-19 DIAGNOSIS — K219 Gastro-esophageal reflux disease without esophagitis: Secondary | ICD-10-CM | POA: Insufficient documentation

## 2014-10-19 DIAGNOSIS — I639 Cerebral infarction, unspecified: Secondary | ICD-10-CM | POA: Diagnosis not present

## 2014-10-19 DIAGNOSIS — R5383 Other fatigue: Secondary | ICD-10-CM | POA: Diagnosis present

## 2014-10-19 DIAGNOSIS — I6782 Cerebral ischemia: Secondary | ICD-10-CM | POA: Diagnosis not present

## 2014-10-19 LAB — CBC WITH DIFFERENTIAL/PLATELET
Basophils Absolute: 0 10*3/uL (ref 0.0–0.1)
Basophils Relative: 0 % (ref 0–1)
EOS PCT: 1 % (ref 0–5)
Eosinophils Absolute: 0.2 10*3/uL (ref 0.0–0.7)
HCT: 35.4 % — ABNORMAL LOW (ref 36.0–46.0)
HEMOGLOBIN: 11.7 g/dL — AB (ref 12.0–15.0)
LYMPHS ABS: 1.5 10*3/uL (ref 0.7–4.0)
LYMPHS PCT: 13 % (ref 12–46)
MCH: 28.7 pg (ref 26.0–34.0)
MCHC: 33.1 g/dL (ref 30.0–36.0)
MCV: 86.8 fL (ref 78.0–100.0)
Monocytes Absolute: 0.7 10*3/uL (ref 0.1–1.0)
Monocytes Relative: 7 % (ref 3–12)
Neutro Abs: 9.1 10*3/uL — ABNORMAL HIGH (ref 1.7–7.7)
Neutrophils Relative %: 79 % — ABNORMAL HIGH (ref 43–77)
Platelets: 218 10*3/uL (ref 150–400)
RBC: 4.08 MIL/uL (ref 3.87–5.11)
RDW: 14.1 % (ref 11.5–15.5)
WBC: 11.5 10*3/uL — ABNORMAL HIGH (ref 4.0–10.5)

## 2014-10-19 LAB — BASIC METABOLIC PANEL
ANION GAP: 10 (ref 5–15)
BUN: 19 mg/dL (ref 6–23)
CHLORIDE: 101 mmol/L (ref 96–112)
CO2: 27 mmol/L (ref 19–32)
Calcium: 10 mg/dL (ref 8.4–10.5)
Creatinine, Ser: 0.81 mg/dL (ref 0.50–1.10)
GFR calc Af Amer: 79 mL/min — ABNORMAL LOW (ref >90)
GFR calc non Af Amer: 68 mL/min — ABNORMAL LOW (ref >90)
Glucose, Bld: 138 mg/dL — ABNORMAL HIGH (ref 70–99)
POTASSIUM: 3.8 mmol/L (ref 3.5–5.1)
SODIUM: 138 mmol/L (ref 135–145)

## 2014-10-19 LAB — TROPONIN I: Troponin I: <0.03 ng/mL (ref <0.031)

## 2014-10-19 MED ORDER — OXYCODONE-ACETAMINOPHEN 5-325 MG PO TABS
1.0000 | ORAL_TABLET | Freq: Once | ORAL | Status: AC
  Administered 2014-10-20: 1 via ORAL
  Filled 2014-10-19: qty 1

## 2014-10-19 NOTE — ED Notes (Signed)
Patient is from home. Patient denies any pain.Patient sent due family request, family states patient is very sleepy and fatigue.

## 2014-10-19 NOTE — ED Provider Notes (Signed)
CSN: [id number]     Arrival date & time 10/19/14  2113 History   First MD Initiated Contact with Patient 10/19/14 2131     Chief Complaint  Patient presents with  . Fatigue   Christy Simmons is a 80 y.o. female with a history of TIA, recent CVA with left sided deficits, hypertension, diabetes, and DVT who presents to the ED from home with her family who reports around 7 pm today she had a period of weakness that lasted about 45 minutes. They report she was sitting on the couch when she became more weak and unable to stand, the patient reports her vision became black in both her eyes. She reports being weak on all sides.  They report this lasted until EMS arrival when her symptoms resolved. She currently report feeling tired, but denies other complaints. She denies any pain. She reports her vision has returned to normal. Family reports she has been in rehab since her CVA and has been doing very well recently. She denies fevers, chills, recent illness, abdominal pain, nausea, vomiting, numbness, tingling, ear pain, sore throat, current changes to her vision, chest pain, shortness of breath, dizziness, lightheadedness, headache or cough.   (Consider location/radiation/quality/duration/timing/severity/associated sxs/prior Treatment) HPI  Past Medical History  Diagnosis Date  . Transient ischemic attack   . HTN (hypertension)   . Hyperlipidemia   . GERD (gastroesophageal reflux disease)   . Heart disease     PFO  . Amnesia 01/10/2013  . Patent foramen ovale with atrial septal aneurysm   . Glaucoma     Hx: of  . Early cataracts, bilateral     Hx: of  . PONV (postoperative nausea and vomiting)   . Family history of anesthesia complication     "My brother has trouble waking up; all of [place] get sick"  . Type II diabetes mellitus   . DVT (deep venous thrombosis)     "BLE; one broke off, went to her brain, caused the stroke" (09/14/2014)  . Thyroid nodule   . Migraine     "stopped in the 1990's"  .  Stroke 09/04/2014    "left side weaker since" (09/14/2014)  . Arthritis     "hands" (09/14/2014)   Past Surgical History  Procedure Laterality Date  . US echocardiography  10-13-11    EF 60-65% normal  . Tee without cardioversion  10/30/2011    Procedure: TRANSESOPHAGEAL ECHOCARDIOGRAM (TEE);  Surgeon: Larey Dresser, MD;  Location: [place];  Service: Cardiovascular;  Laterality: N/A;  . Colonoscopy w/ biopsies and polypectomy      HX;of  . Tonsillectomy    . Tubal ligation    . Lumbar laminectomy/decompression microdiscectomy N/A 09/11/2013    Procedure: LUMBAR LAMINECTOMY/DECOMPRESSION MICRODISCECTOMY 2 LEVELS;  Surgeon: Ophelia Charter, MD;  Location: [place] [facility];  Service: Neurosurgery;  Laterality: N/A;  Lumbar Four-Five Laminectomy and Foraminotomy with redo Lumbar Five-Sacral One diskectomy  . Radiology with anesthesia N/A 09/05/2014    Procedure: RADIOLOGY WITH ANESTHESIA;  Surgeon: Rob Hickman, MD;  Location: [place];  Service: Radiology;  Laterality: N/A;  . Back surgery    . Lumbar disc surgery    . Ivc filter (celect)  09/09/2014   Family History  Problem Relation Age of Onset  . Stroke      No family history of such  . Congenital heart disease      No family history of such  . Anesthesia problems Neg Hx   . Diabetes Maternal  Grandfather    History  Substance Use Topics  . Smoking status: Never Smoker   . Smokeless tobacco: Never Used  . Alcohol Use: No   OB History    No data available     Review of Systems  Constitutional: Positive for fatigue. Negative for fever and chills.  HENT: Negative for congestion, ear pain and sore throat.   Eyes: Positive for visual disturbance. Negative for photophobia, pain and redness.  Respiratory: Negative for cough, shortness of breath and wheezing.   Cardiovascular: Negative for chest pain and palpitations.  Gastrointestinal: Negative for nausea, vomiting, abdominal pain and diarrhea.  Genitourinary: Negative for  dysuria and difficulty urinating.  Musculoskeletal: Negative for back pain and neck pain.  Skin: Negative for rash.  Neurological: Negative for dizziness, syncope, numbness and headaches.      Allergies  Codeine and Gabapentin  Home Medications   Prior to Admission medications   Medication Sig Start Date End Date Taking? Authorizing Provider  apixaban (ELIQUIS) 2.5 MG TABS tablet Take 1 tablet (2.5 mg total) by mouth 2 (two) times daily. 09/16/14  Yes Florencia Reasons, MD  brimonidine (ALPHAGAN) 0.2 % ophthalmic solution Place 1 drop into both eyes 2 (two) times daily.  11/18/12  Yes Historical Provider, MD  Calcium Citrate-Vitamin D 250-200 MG-UNIT TABS Take 1 tablet by mouth daily.   Yes Historical Provider, MD  Cholecalciferol (VITAMIN D3) 1000 UNITS CAPS Take 1,000 Units by mouth daily.   Yes Historical Provider, MD  esomeprazole (NEXIUM) 20 MG capsule Take 20 mg by mouth daily.    Yes Historical Provider, MD  feeding supplement, GLUCERNA SHAKE, (GLUCERNA SHAKE) LIQD Take 237 mLs by mouth 3 (three) times daily between meals. 09/16/14  Yes Florencia Reasons, MD  insulin aspart (NOVOLOG) 100 UNIT/ML injection Inject 0-9 Units into the skin 3 (three) times daily with meals. Patient taking differently: Inject 0-10 Units into the skin 3 (three) times daily with meals. Sliding scale 09/16/14  Yes Florencia Reasons, MD  insulin glargine (LANTUS) 100 UNIT/ML injection Inject 0-10 Units into the skin at bedtime.    Yes Historical Provider, MD  lisinopril (ZESTRIL) 2.5 MG tablet Take 2 tablets (5 mg total) by mouth daily. 09/16/14  Yes Florencia Reasons, MD  Magnesium 200 MG TABS Take 400 mg by mouth daily.   Yes Historical Provider, MD  Multiple Vitamin (MULITIVITAMIN WITH MINERALS) TABS Take 1 tablet by mouth daily.   Yes Historical Provider, MD  oxyCODONE-acetaminophen (PERCOCET/ROXICET) 5-325 MG per tablet Take 1-2 tablets by mouth every 4 (four) hours as needed for moderate pain. Patient taking differently: Take 0.5-1 tablets by  mouth every 4 (four) hours as needed for moderate pain.  09/16/14  Yes Florencia Reasons, MD  polyethylene glycol (MIRALAX / GLYCOLAX) packet Take 17 g by mouth daily.   Yes Historical Provider, MD  polyvinyl alcohol (LIQUIFILM TEARS) 1.4 % ophthalmic solution Place 1 drop into both eyes 3 (three) times daily as needed for dry eyes.   Yes Historical Provider, MD  rOPINIRole (REQUIP) 0.25 MG tablet Take 1 tablet (0.25 mg total) by mouth at bedtime. Patient taking differently: Take 0.25 mg by mouth every evening.  09/16/14  Yes Florencia Reasons, MD  traMADol (ULTRAM) 50 MG tablet Take 1-2 tablets (50-100 mg total) by mouth 2 (two) times daily as needed for moderate pain. Patient taking differently: Take 50 mg by mouth 2 (two) times daily as needed for moderate pain.  09/16/14  Yes Florencia Reasons, MD  atorvastatin (LIPITOR) 20 MG  tablet Take 1 tablet (20 mg total) by mouth daily at 6 PM. 09/16/14   Florencia Reasons, MD  nystatin (MYCOSTATIN) 100000 UNIT/ML suspension Take 5 mLs (500,000 Units total) by mouth 4 (four) times daily. 09/16/14   Florencia Reasons, MD  senna-docusate (SENOKOT-S) 8.6-50 MG per tablet Take 2 tablets by mouth 2 (two) times daily. 09/16/14   Florencia Reasons, MD   BP 154/81 mmHg  Pulse 72  Temp(Src) 98.4 F (36.9 C) (Oral)  Resp 21  SpO2 93% Physical Exam  Constitutional: She is oriented to person, place, and time. She appears well-developed and well-nourished. No distress.  HENT:  Head: Normocephalic and atraumatic.  Right Ear: External ear normal.  Left Ear: External ear normal.  Nose: Nose normal.  Mouth/Throat: Oropharynx is clear and moist. No oropharyngeal exudate.  Bilateral tympanic membranes are pearly-gray without erythema or loss of landmarks.  Eyes: Conjunctivae and EOM are normal. Pupils are equal, round, and reactive to light. Right eye exhibits no discharge. Left eye exhibits no discharge.  Vision grossly intact.   Neck: Neck supple.  Cardiovascular: Normal rate, regular rhythm, normal heart sounds and intact  distal pulses.  Exam reveals no gallop and no friction rub.   No murmur heard. Pulmonary/Chest: Effort normal and breath sounds normal. No respiratory distress. She has no wheezes. She has no rales.  Abdominal: Soft. Bowel sounds are normal. She exhibits no distension. There is no tenderness.  Musculoskeletal:  Left arm strength 4/5, right arm 5/5. Left lower leg 4/5, right lower leg 5/5 strength.   Lymphadenopathy:    She has no cervical adenopathy.  Neurological: She is alert and oriented to person, place, and time. No cranial nerve deficit. Coordination normal.  Cranial nerves are intact. She is alert and oriented 3. Her vision is grossly intact. Sensation is intact in her bilateral upper and lower extremities. No pronator drift. Patient does have mild decreased strength in her left upper and lower arms which she has chronically.   Skin: Skin is warm and dry. No rash noted. She is not diaphoretic. No erythema. No pallor.  Psychiatric: She has a normal mood and affect. Her behavior is normal.  Nursing note and vitals reviewed.   ED Course  Procedures (including critical care time) Labs Review Labs Reviewed  BASIC METABOLIC PANEL - Abnormal; Notable for the following:    Glucose, Bld 138 (*)    GFR calc non Af Amer 68 (*)    GFR calc Af Amer 79 (*)    All other components within normal limits  CBC WITH DIFFERENTIAL/PLATELET - Abnormal; Notable for the following:    WBC 11.5 (*)    Hemoglobin 11.7 (*)    HCT 35.4 (*)    Neutrophils Relative % 79 (*)    Neutro Abs 9.1 (*)    All other components within normal limits  URINALYSIS, ROUTINE W REFLEX MICROSCOPIC - Abnormal; Notable for the following:    APPearance TURBID (*)    Hgb urine dipstick SMALL (*)    Bilirubin Urine SMALL (*)    Ketones, ur 40 (*)    Protein, ur 100 (*)    Leukocytes, UA SMALL (*)    All other components within normal limits  URINE MICROSCOPIC-ADD ON - Abnormal; Notable for the following:    Squamous  Epithelial / LPF FEW (*)    Bacteria, UA MANY (*)    Casts HYALINE CASTS (*)    All other components within normal limits  TROPONIN I  Imaging Review Ct Head Wo Contrast  10/19/2014   CLINICAL DATA:  Acute onset of weakness. Amaurosis fugax. Initial encounter.  EXAM: CT HEAD WITHOUT CONTRAST  TECHNIQUE: Contiguous axial images were obtained from the base of the skull through the vertex without intravenous contrast.  COMPARISON:  CT of the head performed 09/15/2014  FINDINGS: There is no evidence of acute infarction, mass lesion, or intra- or extra-axial hemorrhage on CT.  Prominence of the sulci reflects mild to moderate cortical volume loss. Cerebellar atrophy is noted. Scattered periventricular white matter change likely reflects small vessel ischemic microangiopathy. An area of chronic infarct is noted within the right basal ganglia, stable in appearance.  The brainstem and fourth ventricle are within normal limits. The cerebral hemispheres demonstrate grossly normal gray-white differentiation. No mass effect or midline shift is seen.  There is no evidence of fracture; visualized osseous structures are unremarkable in appearance. The orbits are within normal limits. There is complete opacification of the left side of the sphenoid sinus. The remaining paranasal sinuses and mastoid air cells are well-aerated. No significant soft tissue abnormalities are seen.  IMPRESSION: 1. No acute intracranial pathology seen on CT. 2. Moderate cortical volume loss and scattered small vessel ischemic microangiopathy. 3. Chronic infarct at the right basal ganglia. 4. Complete opacification of the left side of the sphenoid sinus.   Electronically Signed   By: Garald Balding M.D.   On: 10/19/2014 23:43     EKG Interpretation   Date/Time:  Monday October 19 2014 21:35:24 EDT Ventricular Rate:  60 PR Interval:  159 QRS Duration: 85 QT Interval:  413 QTC Calculation: 413 R Axis:   19 Text Interpretation:  Sinus  rhythm RSR' in V1 or V2, right VCD or RVH  since last tracing no significant change Confirmed by BELFI  MD, MELANIE  ([id number]) on 10/19/2014 10:36:50 PM      Filed Vitals:   10/20/14 0030 10/20/14 0045 10/20/14 0100 10/20/14 0115  BP: 121/57 117/88 135/66 154/81  Pulse: 63 66 68 72  Temp:      TempSrc:      Resp: 12 14 17 21   SpO2: 99% 96% 96% 93%     MDM   Meds given in ED:  Medications  oxyCODONE-acetaminophen (PERCOCET/ROXICET) 5-325 MG per tablet 1 tablet (1 tablet Oral Given 10/20/14 0021)    New Prescriptions   No medications on file    Final diagnoses:  Weakness   This is a 80 y.o. female with a history of TIA, recent CVA with left sided deficits, hypertension, diabetes, and DVT who presents to the ED from home with her family who reports around 7 pm today she had a period of weakness that lasted about 45 minutes. They report she was sitting on the couch when she became more weak and unable to stand, the patient reports her vision became black in both her eyes. She reports being weak on both sides. Patient recently had a stroke 2 months ago and has some left-sided deficits. At the time of my evaluation the patient is afebrile and nontoxic-appearing. She denies any pain although reports feeling tired. Patient does have some left sided weakness that is chronic but no new focal neuro deficits are appreciated on exam. Patient's vision is grossly intact. The patient reports all of her symptoms have resolved from earlier. EKG shows no significant change from last tracing. Troponin is negative.  CT scan shows no acute intracranial pathology. It does show the chronic infarct of the right  basal ganglia. After discussing with Dr. Tamera Punt will order MRI.  Patient complaining of her chronic leg pain which she was provided percocet for in the ED.   Patient has a slight leukocytosis at 11.5. Urinalysis shows small leukocytes and many bacteria. Plan is to treat for UTI if MRI is negative after  discussing with Dr. Dina Rich. Patient care handed off to Dr. Dina Rich at shift change who will disposition the patient.   This patient was discussed with and evaluated by Dr. Tamera Punt who agrees with assessment and plan.    Waynetta Pean, PA-C 10/20/14 [place], MD 10/21/14 1515

## 2014-10-20 ENCOUNTER — Encounter (HOSPITAL_COMMUNITY): Payer: Self-pay | Admitting: Radiology

## 2014-10-20 ENCOUNTER — Emergency Department (HOSPITAL_COMMUNITY): Payer: Medicare Other

## 2014-10-20 DIAGNOSIS — G311 Senile degeneration of brain, not elsewhere classified: Secondary | ICD-10-CM | POA: Diagnosis not present

## 2014-10-20 DIAGNOSIS — Z794 Long term (current) use of insulin: Secondary | ICD-10-CM | POA: Diagnosis not present

## 2014-10-20 DIAGNOSIS — I10 Essential (primary) hypertension: Secondary | ICD-10-CM | POA: Diagnosis not present

## 2014-10-20 DIAGNOSIS — I69354 Hemiplegia and hemiparesis following cerebral infarction affecting left non-dominant side: Secondary | ICD-10-CM | POA: Diagnosis not present

## 2014-10-20 DIAGNOSIS — E119 Type 2 diabetes mellitus without complications: Secondary | ICD-10-CM | POA: Diagnosis not present

## 2014-10-20 DIAGNOSIS — I639 Cerebral infarction, unspecified: Secondary | ICD-10-CM | POA: Diagnosis not present

## 2014-10-20 LAB — URINE MICROSCOPIC-ADD ON

## 2014-10-20 LAB — URINALYSIS, ROUTINE W REFLEX MICROSCOPIC
Glucose, UA: NEGATIVE mg/dL
Ketones, ur: 40 mg/dL — AB
NITRITE: NEGATIVE
PH: 7 (ref 5.0–8.0)
Protein, ur: 100 mg/dL — AB
Specific Gravity, Urine: 1.026 (ref 1.005–1.030)
UROBILINOGEN UA: 1 mg/dL (ref 0.0–1.0)

## 2014-10-20 LAB — CBG MONITORING, ED: Glucose-Capillary: 230 mg/dL — ABNORMAL HIGH (ref 70–99)

## 2014-10-20 MED ORDER — CEPHALEXIN 500 MG PO CAPS: 500.0000 mg | ORAL_CAPSULE | Freq: Two times a day (BID) | ORAL | Status: DC

## 2014-10-20 NOTE — Discharge Instructions (Signed)
Urinary Tract Infection Urinary tract infections (UTIs) can develop anywhere along your urinary tract. Your urinary tract is your body's drainage system for removing wastes and extra water. Your urinary tract includes two kidneys, two ureters, a bladder, and a urethra. Your kidneys are a pair of bean-shaped organs. Each kidney is about the size of your fist. They are located below your ribs, one on each side of your spine. CAUSES Infections are caused by microbes, which are microscopic organisms, including fungi, viruses, and bacteria. These organisms are so small that they can only be seen through a microscope. Bacteria are the microbes that most commonly cause UTIs. SYMPTOMS  Symptoms of UTIs may vary by age and gender of the patient and by the location of the infection. Symptoms in young women typically include a frequent and intense urge to urinate and a painful, burning feeling in the bladder or urethra during urination. Older women and men are more likely to be tired, shaky, and weak and have muscle aches and abdominal pain. A fever may mean the infection is in your kidneys. Other symptoms of a kidney infection include pain in your back or sides below the ribs, nausea, and vomiting. DIAGNOSIS To diagnose a UTI, your caregiver will ask you about your symptoms. Your caregiver also will ask to provide a urine sample. The urine sample will be tested for bacteria and white blood cells. White blood cells are made by your body to help fight infection. TREATMENT  Typically, UTIs can be treated with medication. Because most UTIs are caused by a bacterial infection, they usually can be treated with the use of antibiotics. The choice of antibiotic and length of treatment depend on your symptoms and the type of bacteria causing your infection. HOME CARE INSTRUCTIONS  If you were prescribed antibiotics, take them exactly as your caregiver instructs you. Finish the medication even if you feel better after you  have only taken some of the medication.  Drink enough water and fluids to keep your urine clear or pale yellow.  Avoid caffeine, tea, and carbonated beverages. They tend to irritate your bladder.  Empty your bladder often. Avoid holding urine for long periods of time.  Empty your bladder before and after sexual intercourse.  After a bowel movement, women should cleanse from front to back. Use each tissue only once. SEEK MEDICAL CARE IF:   You have back pain.  You develop a fever.  Your symptoms do not begin to resolve within 3 days. SEEK IMMEDIATE MEDICAL CARE IF:   You have severe back pain or lower abdominal pain.  You develop chills.  You have nausea or vomiting.  You have continued burning or discomfort with urination. MAKE SURE YOU:   Understand these instructions.  Will watch your condition.  Will get help right away if you are not doing well or get worse. Document Released: 04/26/2005 Document Revised: 01/16/2012 Document Reviewed: 08/25/2011 [facility] Patient Information 2015 [place], [place]. This information is not intended to replace advice given to you by your health care provider. Make sure you discuss any questions you have with your health care provider. Weakness Weakness is a lack of strength. It may be felt all over the body (generalized) or in one specific part of the body (focal). Some causes of weakness can be serious. You may need further medical evaluation, especially if you are elderly or you have a history of immunosuppression (such as chemotherapy or HIV), kidney disease, heart disease, or diabetes. CAUSES  Weakness can be caused  by many different things, including:  Infection.  Physical exhaustion.  Internal bleeding or other blood loss that results in a lack of red blood cells (anemia).  Dehydration. This cause is more common in elderly people.  Side effects or electrolyte abnormalities from medicines, such as pain medicines or  sedatives.  Emotional distress, anxiety, or depression.  Circulation problems, especially severe peripheral arterial disease.  Heart disease, such as rapid atrial fibrillation, bradycardia, or heart failure.  Nervous system disorders, such as Guillain-Barr syndrome, multiple sclerosis, or stroke. DIAGNOSIS  To find the cause of your weakness, your caregiver will take your history and perform a physical exam. Lab tests or X-rays may also be ordered, if needed. TREATMENT  Treatment of weakness depends on the cause of your symptoms and can vary greatly. HOME CARE INSTRUCTIONS   Rest as needed.  Eat a well-balanced diet.  Try to get some exercise every day.  Only take over-the-counter or prescription medicines as directed by your caregiver. SEEK MEDICAL CARE IF:   Your weakness seems to be getting worse or spreads to other parts of your body.  You develop new aches or pains. SEEK IMMEDIATE MEDICAL CARE IF:   You cannot perform your normal daily activities, such as getting dressed and feeding yourself.  You cannot walk up and down stairs, or you feel exhausted when you do so.  You have shortness of breath or chest pain.  You have difficulty moving parts of your body.  You have weakness in only one area of the body or on only one side of the body.  You have a fever.  You have trouble speaking or swallowing.  You cannot control your bladder or bowel movements.  You have black or bloody vomit or stools. MAKE SURE YOU:  Understand these instructions.  Will watch your condition.  Will get help right away if you are not doing well or get worse. Document Released: 07/17/2005 Document Revised: 01/16/2012 Document Reviewed: 09/15/2011 [facility] Patient Information 2015 [place], [place]. This information is not intended to replace advice given to you by your health care provider. Make sure you discuss any questions you have with your health care provider.

## 2014-10-20 NOTE — ED Provider Notes (Signed)
Patient signed out pending MRI.  MRI with questionable acute stroke in the basal ganglia. When compared to prior MRI from patient's known old stroke, it appears to be in the same location. Consulted neurology. Kathrynn Speed has reviewed the scans and agrees that this is likely residual stroke. Patient is on appropriate stroke preventative medication, Eliquis.  It is possible that the patient's blood pressure dropped causing her to feel symptomatic secondary to her prior stroke. Other workup reveals possible urinary tract infection. Will send urine culture and treat. Discussed workup and plan with family. They will follow-up with primary physician regarding blood pressure management.  After history, exam, and medical workup I feel the patient has been appropriately medically screened and is safe for discharge home. Pertinent diagnoses were discussed with the patient. Patient was given return precautions.   Results for orders placed or performed during the hospital encounter of 22/02/54  Basic metabolic panel  Result Value Ref Range   Sodium 138 135 - 145 mmol/L   Potassium 3.8 3.5 - 5.1 mmol/L   Chloride 101 96 - 112 mmol/L   CO2 27 19 - 32 mmol/L   Glucose, Bld 138 (H) 70 - 99 mg/dL   BUN 19 6 - 23 mg/dL   Creatinine, Ser 0.81 0.50 - 1.10 mg/dL   Calcium 10.0 8.4 - 10.5 mg/dL   GFR calc non Af Amer 68 (L) >90 mL/min   GFR calc Af Amer 79 (L) >90 mL/min   Anion gap 10 5 - 15  CBC with Differential  Result Value Ref Range   WBC 11.5 (H) 4.0 - 10.5 K/uL   RBC 4.08 3.87 - 5.11 MIL/uL   Hemoglobin 11.7 (L) 12.0 - 15.0 g/dL   HCT 35.4 (L) 36.0 - 46.0 %   MCV 86.8 78.0 - 100.0 fL   MCH 28.7 26.0 - 34.0 pg   MCHC 33.1 30.0 - 36.0 g/dL   RDW 14.1 11.5 - 15.5 %   Platelets 218 150 - 400 K/uL   Neutrophils Relative % 79 (H) 43 - 77 %   Neutro Abs 9.1 (H) 1.7 - 7.7 K/uL   Lymphocytes Relative 13 12 - 46 %   Lymphs Abs 1.5 0.7 - 4.0 K/uL   Monocytes Relative 7 3 - 12 %   Monocytes  Absolute 0.7 0.1 - 1.0 K/uL   Eosinophils Relative 1 0 - 5 %   Eosinophils Absolute 0.2 0.0 - 0.7 K/uL   Basophils Relative 0 0 - 1 %   Basophils Absolute 0.0 0.0 - 0.1 K/uL  Troponin I  Result Value Ref Range   Troponin I <0.03 <0.031 ng/mL  Urinalysis, Routine w reflex microscopic  Result Value Ref Range   Color, Urine YELLOW YELLOW   APPearance TURBID (A) CLEAR   Specific Gravity, Urine 1.026 1.005 - 1.030   pH 7.0 5.0 - 8.0   Glucose, UA NEGATIVE NEGATIVE mg/dL   Hgb urine dipstick SMALL (A) NEGATIVE   Bilirubin Urine SMALL (A) NEGATIVE   Ketones, ur 40 (A) NEGATIVE mg/dL   Protein, ur 100 (A) NEGATIVE mg/dL   Urobilinogen, UA 1.0 0.0 - 1.0 mg/dL   Nitrite NEGATIVE NEGATIVE   Leukocytes, UA SMALL (A) NEGATIVE  Urine microscopic-add on  Result Value Ref Range   Squamous Epithelial / LPF FEW (A) RARE   WBC, UA 3-6 <3 WBC/hpf   RBC / HPF 3-6 <3 RBC/hpf   Bacteria, UA MANY (A) RARE   Casts HYALINE CASTS (A) NEGATIVE  CBG monitoring,  ED  Result Value Ref Range   Glucose-Capillary 230 (H) 70 - 99 mg/dL   Ct Head Wo Contrast  10/19/2014   CLINICAL DATA:  Acute onset of weakness. Amaurosis fugax. Initial encounter.  EXAM: CT HEAD WITHOUT CONTRAST  TECHNIQUE: Contiguous axial images were obtained from the base of the skull through the vertex without intravenous contrast.  COMPARISON:  CT of the head performed 09/15/2014  FINDINGS: There is no evidence of acute infarction, mass lesion, or intra- or extra-axial hemorrhage on CT.  Prominence of the sulci reflects mild to moderate cortical volume loss. Cerebellar atrophy is noted. Scattered periventricular white matter change likely reflects small vessel ischemic microangiopathy. An area of chronic infarct is noted within the right basal ganglia, stable in appearance.  The brainstem and fourth ventricle are within normal limits. The cerebral hemispheres demonstrate grossly normal gray-white differentiation. No mass effect or midline shift is  seen.  There is no evidence of fracture; visualized osseous structures are unremarkable in appearance. The orbits are within normal limits. There is complete opacification of the left side of the sphenoid sinus. The remaining paranasal sinuses and mastoid air cells are well-aerated. No significant soft tissue abnormalities are seen.  IMPRESSION: 1. No acute intracranial pathology seen on CT. 2. Moderate cortical volume loss and scattered small vessel ischemic microangiopathy. 3. Chronic infarct at the right basal ganglia. 4. Complete opacification of the left side of the sphenoid sinus.   Electronically Signed   By: Garald Balding M.D.   On: 10/19/2014 23:43   Mr Brain Wo Contrast  10/20/2014   CLINICAL DATA:  Initial evaluation for acute left-sided weakness, vision loss.  EXAM: MRI HEAD WITHOUT CONTRAST  TECHNIQUE: Multiplanar, multiecho pulse sequences of the brain and surrounding structures were obtained without intravenous contrast.  COMPARISON:  Prior CT from 10/19/2014  FINDINGS: Diffuse prominence of the CSF containing spaces compatible with generalized cerebral atrophy. Mild patchy and confluent T2/FLAIR hyperintensity within the periventricular and deep white matter both cerebral hemispheres most consistent with chronic small vessel ischemic changes. Small remote lacunar infarcts present within the right basal ganglia as well as the left centrum semi ovale.  There is an acute ischemic infarct primarily involving the right basal ganglia with superior extension into the white matter of the right corona radiata. This infarct measures 18 x 13 x 18 mm. Scattered associated petechial hemorrhage present on gradient echo sequence without frank hemorrhagic conversion. Probable remote hemorrhagic lacunar infarct present just inferiorly within the right lentiform nucleus. No significant mass effect.  Additional subtle hyperintense focus seen on DWI sequence involving the cortex of the left parietal region may  reflect an additional more subacute infarct (series 3, image 26). There is associated petechial hemorrhage with this lesion is well.  Few scattered small chronic micro hemorrhages noted within the left cerebellar hemisphere and right temporal occipital region, likely related to previously seen infarcts.  No other acute intracranial infarct.  No mass lesion or midline shift. No hydrocephalus. No extra-axial fluid collection.  Craniocervical junction within normal limits. Pituitary gland unremarkable.  No acute abnormality about the orbits.  Left sphenoid sinus is completely opacified as are the posterior ethmoidal air cells. Paranasal sinuses are otherwise largely clear. No frank mastoid effusion.  IMPRESSION: 1. Acute ischemic infarct involving the right basal ganglia/white matter of the right corona radiata without significant mass effect. There is a small amount of associated petechial hemorrhage with this infarct without frank hemorrhagic conversion. 2. Probable additional more subacute infarct involving the  cortex of the left parietal region with associated small amount of petechial hemorrhage as above. 3. Atrophy with mild chronic small vessel ischemic disease and additional remote infarcts as above. 4. Prominent left sphenoethmoidal sinus disease.   Electronically Signed   By: Jeannine Boga M.D.   On: 10/20/2014 03:02      Merryl Hacker, MD 10/20/14 [phone]

## 2014-10-22 DIAGNOSIS — I69354 Hemiplegia and hemiparesis following cerebral infarction affecting left non-dominant side: Secondary | ICD-10-CM | POA: Diagnosis not present

## 2014-10-22 DIAGNOSIS — I10 Essential (primary) hypertension: Secondary | ICD-10-CM | POA: Diagnosis not present

## 2014-10-22 DIAGNOSIS — Z794 Long term (current) use of insulin: Secondary | ICD-10-CM | POA: Diagnosis not present

## 2014-10-22 DIAGNOSIS — E119 Type 2 diabetes mellitus without complications: Secondary | ICD-10-CM | POA: Diagnosis not present

## 2014-10-22 LAB — URINE CULTURE: Colony Count: >=100000

## 2014-10-26 DIAGNOSIS — Z794 Long term (current) use of insulin: Secondary | ICD-10-CM | POA: Diagnosis not present

## 2014-10-26 DIAGNOSIS — E119 Type 2 diabetes mellitus without complications: Secondary | ICD-10-CM | POA: Diagnosis not present

## 2014-10-26 DIAGNOSIS — I10 Essential (primary) hypertension: Secondary | ICD-10-CM | POA: Diagnosis not present

## 2014-10-26 DIAGNOSIS — I69354 Hemiplegia and hemiparesis following cerebral infarction affecting left non-dominant side: Secondary | ICD-10-CM | POA: Diagnosis not present

## 2014-10-27 DIAGNOSIS — E119 Type 2 diabetes mellitus without complications: Secondary | ICD-10-CM | POA: Diagnosis not present

## 2014-10-27 DIAGNOSIS — Z794 Long term (current) use of insulin: Secondary | ICD-10-CM | POA: Diagnosis not present

## 2014-10-27 DIAGNOSIS — I10 Essential (primary) hypertension: Secondary | ICD-10-CM | POA: Diagnosis not present

## 2014-10-27 DIAGNOSIS — I69354 Hemiplegia and hemiparesis following cerebral infarction affecting left non-dominant side: Secondary | ICD-10-CM | POA: Diagnosis not present

## 2014-10-29 DIAGNOSIS — I69354 Hemiplegia and hemiparesis following cerebral infarction affecting left non-dominant side: Secondary | ICD-10-CM | POA: Diagnosis not present

## 2014-10-29 DIAGNOSIS — Z794 Long term (current) use of insulin: Secondary | ICD-10-CM | POA: Diagnosis not present

## 2014-10-29 DIAGNOSIS — I10 Essential (primary) hypertension: Secondary | ICD-10-CM | POA: Diagnosis not present

## 2014-10-29 DIAGNOSIS — E119 Type 2 diabetes mellitus without complications: Secondary | ICD-10-CM | POA: Diagnosis not present

## 2014-10-30 DIAGNOSIS — I69354 Hemiplegia and hemiparesis following cerebral infarction affecting left non-dominant side: Secondary | ICD-10-CM | POA: Diagnosis not present

## 2014-10-30 DIAGNOSIS — E119 Type 2 diabetes mellitus without complications: Secondary | ICD-10-CM | POA: Diagnosis not present

## 2014-10-30 DIAGNOSIS — I10 Essential (primary) hypertension: Secondary | ICD-10-CM | POA: Diagnosis not present

## 2014-10-30 DIAGNOSIS — Z794 Long term (current) use of insulin: Secondary | ICD-10-CM | POA: Diagnosis not present

## 2014-11-02 DIAGNOSIS — Z794 Long term (current) use of insulin: Secondary | ICD-10-CM | POA: Diagnosis not present

## 2014-11-02 DIAGNOSIS — I69354 Hemiplegia and hemiparesis following cerebral infarction affecting left non-dominant side: Secondary | ICD-10-CM | POA: Diagnosis not present

## 2014-11-02 DIAGNOSIS — I10 Essential (primary) hypertension: Secondary | ICD-10-CM | POA: Diagnosis not present

## 2014-11-02 DIAGNOSIS — E119 Type 2 diabetes mellitus without complications: Secondary | ICD-10-CM | POA: Diagnosis not present

## 2014-11-06 DIAGNOSIS — I82403 Acute embolism and thrombosis of unspecified deep veins of lower extremity, bilateral: Secondary | ICD-10-CM | POA: Diagnosis not present

## 2014-11-06 DIAGNOSIS — R531 Weakness: Secondary | ICD-10-CM | POA: Diagnosis not present

## 2014-11-06 DIAGNOSIS — I69354 Hemiplegia and hemiparesis following cerebral infarction affecting left non-dominant side: Secondary | ICD-10-CM | POA: Diagnosis not present

## 2014-11-06 DIAGNOSIS — I10 Essential (primary) hypertension: Secondary | ICD-10-CM | POA: Diagnosis not present

## 2014-11-06 DIAGNOSIS — Z1389 Encounter for screening for other disorder: Secondary | ICD-10-CM | POA: Diagnosis not present

## 2014-11-09 ENCOUNTER — Encounter: Payer: Self-pay | Admitting: Cardiovascular Disease

## 2014-11-23 ENCOUNTER — Other Ambulatory Visit (HOSPITAL_COMMUNITY): Payer: Self-pay | Admitting: Physician Assistant

## 2014-11-23 DIAGNOSIS — I82409 Acute embolism and thrombosis of unspecified deep veins of unspecified lower extremity: Secondary | ICD-10-CM

## 2014-11-24 ENCOUNTER — Other Ambulatory Visit: Payer: Medicare Other

## 2014-11-24 ENCOUNTER — Other Ambulatory Visit: Payer: Self-pay | Admitting: Physician Assistant

## 2014-11-24 DIAGNOSIS — Z95828 Presence of other vascular implants and grafts: Secondary | ICD-10-CM

## 2014-11-25 ENCOUNTER — Ambulatory Visit
Admission: RE | Admit: 2014-11-25 | Discharge: 2014-11-25 | Disposition: A | Discharge status: Home / Self Care | Payer: Medicare Other | Source: Ambulatory Visit | Attending: Physician Assistant | Admitting: Physician Assistant

## 2014-11-25 DIAGNOSIS — Z95828 Presence of other vascular implants and grafts: Secondary | ICD-10-CM

## 2014-11-25 DIAGNOSIS — Z7901 Long term (current) use of anticoagulants: Secondary | ICD-10-CM | POA: Diagnosis not present

## 2014-11-25 DIAGNOSIS — I82593 Chronic embolism and thrombosis of other specified deep vein of lower extremity, bilateral: Secondary | ICD-10-CM | POA: Diagnosis not present

## 2014-11-25 NOTE — Consult Note (Signed)
Chief Complaint: Chief Complaint  Patient presents with  . Advice Only    Consult for possible IVC filter retrieval    Referring Physician(s): Conroy,Nathan  History of Present Illness: Christy Simmons is a 80 y.o. female with history of stroke due to a right MCA occlusion and status post endovascular revascularization of the MCA. Following the revascularization, patient was found to have bilateral lower extremity DVTs. As a result, the patient had an IVC filter placed on 09/09/2014 for pulmonary embolism prophylaxis because the patient was not a candidate for anticoagulation at that time. The patient has undergone rehabilitation and has regained some function along the left side of her body. She continues to have weakness and difficulty with her left extremities. She is also complaining of swelling in the left ankle and foot. Patient had another episode of left-sided weakness and an MRI on 10/20/2014 demonstrated an acute ischemic infarct in the right basal ganglia and right white matter tracts. The patient is currently on Eliquis for the DVT treatment. Patient presents for discussion of IVC filter retrieval.  Past Medical History  Diagnosis Date  . Transient ischemic attack   . HTN (hypertension)   . Hyperlipidemia   . GERD (gastroesophageal reflux disease)   . Heart disease     PFO  . Amnesia 01/10/2013  . Patent foramen ovale with atrial septal aneurysm   . Glaucoma     Hx: of  . Early cataracts, bilateral     Hx: of  . PONV (postoperative nausea and vomiting)   . Family history of anesthesia complication     "My brother has trouble waking up; all of [place] get sick"  . Type II diabetes mellitus   . DVT (deep venous thrombosis)     "BLE; one broke off, went to her brain, caused the stroke" (09/14/2014)  . Thyroid nodule   . Migraine     "stopped in the 1990's"  . Stroke 09/04/2014    "left side weaker since" (09/14/2014)  . Arthritis     "hands" (09/14/2014)    Past Surgical  History  Procedure Laterality Date  . US echocardiography  10-13-11    EF 60-65% normal  . Tee without cardioversion  10/30/2011    Procedure: TRANSESOPHAGEAL ECHOCARDIOGRAM (TEE);  Surgeon: Larey Dresser, MD;  Location: [place];  Service: Cardiovascular;  Laterality: N/A;  . Colonoscopy w/ biopsies and polypectomy      HX;of  . Tonsillectomy    . Tubal ligation    . Lumbar laminectomy/decompression microdiscectomy N/A 09/11/2013    Procedure: LUMBAR LAMINECTOMY/DECOMPRESSION MICRODISCECTOMY 2 LEVELS;  Surgeon: Ophelia Charter, MD;  Location: [place] [facility];  Service: Neurosurgery;  Laterality: N/A;  Lumbar Four-Five Laminectomy and Foraminotomy with redo Lumbar Five-Sacral One diskectomy  . Radiology with anesthesia N/A 09/05/2014    Procedure: RADIOLOGY WITH ANESTHESIA;  Surgeon: Rob Hickman, MD;  Location: [place];  Service: Radiology;  Laterality: N/A;  . Back surgery    . Lumbar disc surgery    . Ivc filter (celect)  09/09/2014    Allergies: Codeine and Gabapentin  Medications: Prior to Admission medications   Medication Sig Start Date End Date Taking? Authorizing Provider  apixaban (ELIQUIS) 5 MG TABS tablet Take 5 mg by mouth 2 (two) times daily.   Yes Historical Provider, MD  atorvastatin (LIPITOR) 20 MG tablet Take 1 tablet (20 mg total) by mouth daily at 6 PM. 09/16/14  Yes Florencia Reasons, MD  brimonidine (ALPHAGAN) 0.2 %  ophthalmic solution Place 1 drop into both eyes 2 (two) times daily.  11/18/12  Yes Historical Provider, MD  Calcium Citrate-Vitamin D 250-200 MG-UNIT TABS Take 1 tablet by mouth daily.   Yes Historical Provider, MD  calcium-vitamin D (OSCAL WITH D) 500-200 MG-UNIT per tablet Take 2 tablets by mouth daily.   Yes Historical Provider, MD  esomeprazole (NEXIUM) 20 MG capsule Take 20 mg by mouth daily at 12 noon.   Yes Historical Provider, MD  GLUCOSAMINE CHONDROITIN COMPLX PO Take 1 capsule by mouth daily.   Yes Historical Provider, MD  Hypromellose 0.4 % SOLN  Apply 1-2 drops to eye 3 (three) times daily. 1-2 drops in RIGHT eye tid as needed   Yes Historical Provider, MD  insulin aspart (NOVOLOG) 100 UNIT/ML injection Inject 0-9 Units into the skin 3 (three) times daily with meals. Patient taking differently: Inject 0-10 Units into the skin 3 (three) times daily with meals. Sliding scale 09/16/14  Yes Florencia Reasons, MD  lisinopril (ZESTRIL) 2.5 MG tablet Take 2 tablets (5 mg total) by mouth daily. 09/16/14  Yes Florencia Reasons, MD  Magnesium 200 MG TABS Take 400 mg by mouth daily.   Yes Historical Provider, MD  Multiple Vitamin (MULITIVITAMIN WITH MINERALS) TABS Take 1 tablet by mouth daily.   Yes Historical Provider, MD  nystatin (MYCOSTATIN) 100000 UNIT/ML suspension Take 5 mLs (500,000 Units total) by mouth 4 (four) times daily. 09/16/14  Yes Florencia Reasons, MD  oxyCODONE-acetaminophen (PERCOCET/ROXICET) 5-325 MG per tablet Take 1-2 tablets by mouth every 4 (four) hours as needed for moderate pain. Patient taking differently: Take 0.5-1 tablets by mouth every 4 (four) hours as needed for moderate pain.  09/16/14  Yes Florencia Reasons, MD  polyethylene glycol (MIRALAX / GLYCOLAX) packet Take 17 g by mouth daily.   Yes Historical Provider, MD  rOPINIRole (REQUIP) 0.25 MG tablet Take 1 tablet (0.25 mg total) by mouth at bedtime. Patient taking differently: Take 0.25 mg by mouth every evening.  09/16/14  Yes Florencia Reasons, MD  senna-docusate (SENOKOT-S) 8.6-50 MG per tablet Take 2 tablets by mouth 2 (two) times daily. 09/16/14  Yes Florencia Reasons, MD  traMADol (ULTRAM) 50 MG tablet Take 1-2 tablets (50-100 mg total) by mouth 2 (two) times daily as needed for moderate pain. Patient taking differently: Take 50 mg by mouth 2 (two) times daily as needed for moderate pain.  09/16/14  Yes Florencia Reasons, MD  apixaban (ELIQUIS) 2.5 MG TABS tablet Take 1 tablet (2.5 mg total) by mouth 2 (two) times daily. 09/16/14   Florencia Reasons, MD  bisacodyl (DULCOLAX) 10 MG suppository Place 10 mg rectally as needed for moderate  constipation.    Historical Provider, MD  cephALEXin (KEFLEX) 500 MG capsule Take 1 capsule (500 mg total) by mouth 2 (two) times daily. 10/20/14   Merryl Hacker, MD  Cholecalciferol (VITAMIN D3) 1000 UNITS CAPS Take 1,000 Units by mouth daily.    Historical Provider, MD  esomeprazole (NEXIUM) 20 MG capsule Take 20 mg by mouth daily.     Historical Provider, MD  feeding supplement, GLUCERNA SHAKE, (GLUCERNA SHAKE) LIQD Take 237 mLs by mouth 3 (three) times daily between meals. 09/16/14   Florencia Reasons, MD  insulin glargine (LANTUS) 100 UNIT/ML injection Inject 0-10 Units into the skin at bedtime.     Historical Provider, MD  polyvinyl alcohol (LIQUIFILM TEARS) 1.4 % ophthalmic solution Place 1 drop into both eyes 3 (three) times daily as needed for dry eyes.  Historical Provider, MD     Family History  Problem Relation Age of Onset  . Stroke      No family history of such  . Congenital heart disease      No family history of such  . Anesthesia problems Neg Hx   . Diabetes Maternal Grandfather     History   Social History  . Marital Status: Married    Spouse Name: N/A  . Number of Children: 5  . Years of Education: 14   Occupational History  .     Social History Main Topics  . Smoking status: Never Smoker   . Smokeless tobacco: Never Used  . Alcohol Use: No  . Drug Use: No  . Sexual Activity: No   Other Topics Concern  . Not on file   Social History Narrative     Review of Systems  Constitutional: Positive for fatigue.  Musculoskeletal:       Left ankle and foot swelling.  Neurological: Positive for weakness.       Weakness on left side.    Vital Signs: BP 122/66 mmHg  Pulse 56  Temp(Src) 97.5 F (36.4 C) (Oral)  Resp 14  Ht 5' 5.5" (1.664 m)  Wt 106 lb (48.081 kg)  BMI 17.36 kg/m2  SpO2 99%  Physical Exam  Musculoskeletal: She exhibits edema.  Mild swelling in left foot, ankle and calf No ulcerations. No significant discoloration.   Left thigh is  soft.         Imaging: No results found.  Labs:  CBC:  Recent Labs  09/06/14 0300 09/11/14 0615 09/13/14 1846 10/19/14 2235  WBC 14.6* 7.6 8.6 11.5*  HGB 10.4* 9.4* 9.3* 11.7*  HCT 30.0* 27.9* 28.1* 35.4*  PLT 130* 211 187 218    COAGS:  Recent Labs  09/05/14 0744  INR 1.07  APTT 28    BMP:  Recent Labs  09/06/14 0300 09/11/14 0615 09/13/14 1846 10/19/14 2235  NA 137 138 136 138  K 3.5 4.3 4.5 3.8  CL 106 109 106 101  CO2 24 22 22 27   GLUCOSE 129* 111* 142* 138*  BUN 20 11 12 19   CALCIUM 8.2* 8.8 9.2 10.0  CREATININE 0.84 0.69 0.77 0.81  GFRNONAA 65* 81* 78* 68*  GFRAA 76* >90 >90 79*    LIVER FUNCTION TESTS:  Recent Labs  09/05/14 0744 09/11/14 0615  BILITOT 0.5 0.8  AST 30 28  ALT 23 15  ALKPHOS 71 59  PROT 6.3 4.6*  ALBUMIN 3.6 2.0*    Assessment and Plan:  80 year old with history of CVAs and still recovering from the stroke in February 2016. The patient is currently anticoagulated for the bilateral lower extremity DVTs. The IVC filter is no longer needed from a pulmonary embolism prophylaxis standpoint. I explained the risks and benefits of removing the IVC filter with the patient and her family in depth. I feel they have a very good understanding of the IVC filter and the retrieval procedure. The patient is a candidate for an IVC filter retrieval.  The patient and family would like to wait a little bit longer before having the IVC filter removed. The patient is still recovering from the strokes and she continues to be weak. They would like to wait until her strength has improved.  We will contact the patient in 3 months and decide if we want to discuss IVC filter retrieval again at that time. Again, the patient would be a candidate for IVC  filter retrieval as long as she remains anticoagulated. If the patient goes off the anticoagulation, then we would need to make sure that the patient is clot free in the lower extremityies by repeating  the lower extremity venous duplex.  The patient has edema in the left foot, ankle and calf. I suspect this edema is related to the DVT and her stroke. I have recommended compression hose. I gave her information about compression stockings and a prescription for 15-20 mm thigh-high stockings. Hopefully this will help with the patient's swelling and symptoms.   Thank you for this interesting consult.  I greatly enjoyed meeting Christy Simmons and look forward to participating in their care.  SignedCarylon Perches 11/25/2014, 3:45 PM    I spent a total of  15 Minutes in face to face in clinical consultation, greater than 50% of which was counseling/coordinating care for IVC filter management.

## 2014-12-04 ENCOUNTER — Ambulatory Visit (HOSPITAL_COMMUNITY)
Admission: RE | Admit: 2014-12-04 | Discharge: 2014-12-04 | Disposition: A | Discharge status: Home / Self Care | Payer: Medicare Other | Source: Ambulatory Visit | Attending: Interventional Radiology | Admitting: Interventional Radiology

## 2014-12-04 ENCOUNTER — Other Ambulatory Visit (HOSPITAL_COMMUNITY): Payer: Self-pay | Admitting: Interventional Radiology

## 2014-12-04 DIAGNOSIS — I6601 Occlusion and stenosis of right middle cerebral artery: Secondary | ICD-10-CM | POA: Diagnosis not present

## 2014-12-04 DIAGNOSIS — I639 Cerebral infarction, unspecified: Secondary | ICD-10-CM

## 2014-12-10 DIAGNOSIS — I69354 Hemiplegia and hemiparesis following cerebral infarction affecting left non-dominant side: Secondary | ICD-10-CM | POA: Diagnosis not present

## 2014-12-10 DIAGNOSIS — I82403 Acute embolism and thrombosis of unspecified deep veins of lower extremity, bilateral: Secondary | ICD-10-CM | POA: Diagnosis not present

## 2014-12-10 DIAGNOSIS — F329 Major depressive disorder, single episode, unspecified: Secondary | ICD-10-CM | POA: Diagnosis not present

## 2014-12-10 DIAGNOSIS — I10 Essential (primary) hypertension: Secondary | ICD-10-CM | POA: Diagnosis not present

## 2014-12-10 DIAGNOSIS — E1139 Type 2 diabetes mellitus with other diabetic ophthalmic complication: Secondary | ICD-10-CM | POA: Diagnosis not present

## 2014-12-15 ENCOUNTER — Other Ambulatory Visit: Payer: Self-pay | Admitting: Physician Assistant

## 2014-12-16 ENCOUNTER — Other Ambulatory Visit (HOSPITAL_COMMUNITY): Payer: Self-pay | Admitting: Radiology

## 2014-12-16 ENCOUNTER — Other Ambulatory Visit: Payer: Self-pay | Admitting: Radiology

## 2014-12-17 ENCOUNTER — Ambulatory Visit (HOSPITAL_COMMUNITY)
Admission: RE | Admit: 2014-12-17 | Discharge: 2014-12-17 | Disposition: A | Discharge status: Home / Self Care | Payer: Medicare Other | Source: Ambulatory Visit | Attending: Interventional Radiology | Admitting: Interventional Radiology

## 2014-12-17 ENCOUNTER — Other Ambulatory Visit (HOSPITAL_COMMUNITY): Payer: Self-pay | Admitting: Interventional Radiology

## 2014-12-17 ENCOUNTER — Encounter (HOSPITAL_COMMUNITY): Payer: Self-pay

## 2014-12-17 DIAGNOSIS — G43909 Migraine, unspecified, not intractable, without status migrainosus: Secondary | ICD-10-CM | POA: Insufficient documentation

## 2014-12-17 DIAGNOSIS — R55 Syncope and collapse: Secondary | ICD-10-CM | POA: Diagnosis not present

## 2014-12-17 DIAGNOSIS — Q211 Atrial septal defect: Secondary | ICD-10-CM | POA: Diagnosis not present

## 2014-12-17 DIAGNOSIS — I69354 Hemiplegia and hemiparesis following cerebral infarction affecting left non-dominant side: Secondary | ICD-10-CM | POA: Diagnosis not present

## 2014-12-17 DIAGNOSIS — I253 Aneurysm of heart: Secondary | ICD-10-CM | POA: Insufficient documentation

## 2014-12-17 DIAGNOSIS — Z86718 Personal history of other venous thrombosis and embolism: Secondary | ICD-10-CM | POA: Diagnosis not present

## 2014-12-17 DIAGNOSIS — I10 Essential (primary) hypertension: Secondary | ICD-10-CM | POA: Insufficient documentation

## 2014-12-17 DIAGNOSIS — I6601 Occlusion and stenosis of right middle cerebral artery: Secondary | ICD-10-CM | POA: Diagnosis not present

## 2014-12-17 DIAGNOSIS — Z794 Long term (current) use of insulin: Secondary | ICD-10-CM | POA: Diagnosis not present

## 2014-12-17 DIAGNOSIS — K219 Gastro-esophageal reflux disease without esophagitis: Secondary | ICD-10-CM | POA: Diagnosis not present

## 2014-12-17 DIAGNOSIS — I639 Cerebral infarction, unspecified: Secondary | ICD-10-CM

## 2014-12-17 DIAGNOSIS — Z7901 Long term (current) use of anticoagulants: Secondary | ICD-10-CM | POA: Insufficient documentation

## 2014-12-17 DIAGNOSIS — I6521 Occlusion and stenosis of right carotid artery: Secondary | ICD-10-CM | POA: Insufficient documentation

## 2014-12-17 DIAGNOSIS — Z79899 Other long term (current) drug therapy: Secondary | ICD-10-CM | POA: Diagnosis not present

## 2014-12-17 DIAGNOSIS — E785 Hyperlipidemia, unspecified: Secondary | ICD-10-CM | POA: Insufficient documentation

## 2014-12-17 DIAGNOSIS — E1136 Type 2 diabetes mellitus with diabetic cataract: Secondary | ICD-10-CM | POA: Insufficient documentation

## 2014-12-17 LAB — BASIC METABOLIC PANEL
Anion gap: 8 (ref 5–15)
BUN: 21 mg/dL — ABNORMAL HIGH (ref 6–20)
CO2: 26 mmol/L (ref 22–32)
Calcium: 9.6 mg/dL (ref 8.9–10.3)
Chloride: 103 mmol/L (ref 101–111)
Creatinine, Ser: 0.78 mg/dL (ref 0.44–1.00)
GFR calc Af Amer: >60 mL/min (ref >60)
Glucose, Bld: 210 mg/dL — ABNORMAL HIGH (ref 65–99)
Potassium: 4.3 mmol/L (ref 3.5–5.1)
Sodium: 137 mmol/L (ref 135–145)

## 2014-12-17 LAB — CBC
HEMATOCRIT: 33.6 % — AB (ref 36.0–46.0)
Hemoglobin: 11 g/dL — ABNORMAL LOW (ref 12.0–15.0)
MCH: 29.3 pg (ref 26.0–34.0)
MCHC: 32.7 g/dL (ref 30.0–36.0)
MCV: 89.4 fL (ref 78.0–100.0)
Platelets: 242 10*3/uL (ref 150–400)
RBC: 3.76 MIL/uL — ABNORMAL LOW (ref 3.87–5.11)
RDW: 14.2 % (ref 11.5–15.5)
WBC: 8.4 10*3/uL (ref 4.0–10.5)

## 2014-12-17 LAB — PROTIME-INR
INR: 1.1 (ref 0.00–1.49)
Prothrombin Time: 14.4 seconds (ref 11.6–15.2)

## 2014-12-17 LAB — GLUCOSE, CAPILLARY
GLUCOSE-CAPILLARY: 151 mg/dL — AB (ref 65–99)
Glucose-Capillary: 215 mg/dL — ABNORMAL HIGH (ref 65–99)

## 2014-12-17 MED ORDER — HEPARIN SOD (PORK) LOCK FLUSH 100 UNIT/ML IV SOLN
INTRAVENOUS | Status: AC
  Filled 2014-12-17: qty 15

## 2014-12-17 MED ORDER — MIDAZOLAM HCL 2 MG/2ML IJ SOLN
INTRAMUSCULAR | Status: AC
  Filled 2014-12-17: qty 2

## 2014-12-17 MED ORDER — HEPARIN SODIUM (PORCINE) 1000 UNIT/ML IJ SOLN
INTRAMUSCULAR | Status: AC | PRN
  Administered 2014-12-17: 1000 [IU] via INTRAVENOUS

## 2014-12-17 MED ORDER — FENTANYL CITRATE (PF) 100 MCG/2ML IJ SOLN
INTRAMUSCULAR | Status: AC | PRN
  Administered 2014-12-17: 12.5 ug via INTRAVENOUS

## 2014-12-17 MED ORDER — SODIUM CHLORIDE 0.9 % IV SOLN
INTRAVENOUS | Status: DC
  Administered 2014-12-17: 10:00:00 via INTRAVENOUS

## 2014-12-17 MED ORDER — MIDAZOLAM HCL 2 MG/2ML IJ SOLN
INTRAMUSCULAR | Status: AC | PRN
  Administered 2014-12-17: 0.5 mg via INTRAVENOUS

## 2014-12-17 MED ORDER — LIDOCAINE HCL 1 % IJ SOLN
INTRAMUSCULAR | Status: AC
  Filled 2014-12-17: qty 20

## 2014-12-17 MED ORDER — FENTANYL CITRATE (PF) 100 MCG/2ML IJ SOLN
INTRAMUSCULAR | Status: AC
  Filled 2014-12-17: qty 2

## 2014-12-17 MED ORDER — SODIUM CHLORIDE 0.9 % IV SOLN: INTRAVENOUS | Status: AC

## 2014-12-17 MED ORDER — IOHEXOL 300 MG/ML  SOLN
150.0000 mL | Freq: Once | INTRAMUSCULAR | Status: AC | PRN
  Administered 2014-12-17: 70 mL via INTRAVENOUS

## 2014-12-17 NOTE — Procedures (Signed)
S/P Bilateral common carotid arteriogram,and Rt vert angiogram. Rt CFsa approach. Findings. 1.Mild scattered intracranial arteriosclerosis. 2.Minimal RT MCA M1 stenosis

## 2014-12-17 NOTE — H&P (Signed)
Chief Complaint: Stroke/TIA  Referring Physician(s): Deveshwar,Sanjeev  History of Present Illness: Christy Simmons is a 80 y.o. female who reports h/o  CVA back in February. She has been taking Eliquis. She also reports a more recent "TIA" where she states she "Just blacked out" She has held her Eliquis since 5/16 and she is currently taking 325 mg ASA She is here today for Carotid Arteriogram  Past Medical History  Diagnosis Date  . Transient ischemic attack   . HTN (hypertension)   . Hyperlipidemia   . GERD (gastroesophageal reflux disease)   . Heart disease     PFO  . Amnesia 01/10/2013  . Patent foramen ovale with atrial septal aneurysm   . Glaucoma     Hx: of  . Early cataracts, bilateral     Hx: of  . PONV (postoperative nausea and vomiting)   . Family history of anesthesia complication     "My brother has trouble waking up; all of [place] get sick"  . Type II diabetes mellitus   . DVT (deep venous thrombosis)     "BLE; one broke off, went to her brain, caused the stroke" (09/14/2014)  . Thyroid nodule   . Migraine     "stopped in the 1990's"  . Stroke 09/04/2014    "left side weaker since" (09/14/2014)  . Arthritis     "hands" (09/14/2014)    Past Surgical History  Procedure Laterality Date  . US echocardiography  10-13-11    EF 60-65% normal  . Tee without cardioversion  10/30/2011    Procedure: TRANSESOPHAGEAL ECHOCARDIOGRAM (TEE);  Surgeon: Larey Dresser, MD;  Location: [place];  Service: Cardiovascular;  Laterality: N/A;  . Colonoscopy w/ biopsies and polypectomy      HX;of  . Tonsillectomy    . Tubal ligation    . Lumbar laminectomy/decompression microdiscectomy N/A 09/11/2013    Procedure: LUMBAR LAMINECTOMY/DECOMPRESSION MICRODISCECTOMY 2 LEVELS;  Surgeon: Ophelia Charter, MD;  Location: [place] [facility];  Service: Neurosurgery;  Laterality: N/A;  Lumbar Four-Five Laminectomy and Foraminotomy with redo Lumbar Five-Sacral One diskectomy  . Radiology with  anesthesia N/A 09/05/2014    Procedure: RADIOLOGY WITH ANESTHESIA;  Surgeon: Rob Hickman, MD;  Location: [place];  Service: Radiology;  Laterality: N/A;  . Back surgery    . Lumbar disc surgery    . Ivc filter (celect)  09/09/2014    Allergies: Codeine and Gabapentin  Medications: Prior to Admission medications   Medication Sig Start Date End Date Taking? Authorizing Provider  apixaban (ELIQUIS) 2.5 MG TABS tablet Take 1 tablet (2.5 mg total) by mouth 2 (two) times daily. 09/16/14  Yes Florencia Reasons, MD  atorvastatin (LIPITOR) 10 MG tablet Take 5 mg by mouth daily.   Yes Historical Provider, MD  bisacodyl (DULCOLAX) 10 MG suppository Take 10 mg by mouth daily as needed for moderate constipation.    Yes Historical Provider, MD  brimonidine (ALPHAGAN) 0.2 % ophthalmic solution Place 1 drop into both eyes 2 (two) times daily.  11/18/12  Yes Historical Provider, MD  feeding supplement, GLUCERNA SHAKE, (GLUCERNA SHAKE) LIQD Take 237 mLs by mouth 3 (three) times daily between meals. Patient taking differently: Take 237 mLs by mouth 2 (two) times daily between meals.  09/16/14  Yes Florencia Reasons, MD  GLUCOSAMINE CHONDROITIN COMPLX PO Take 1 capsule by mouth daily.   Yes Historical Provider, MD  Hypromellose 0.4 % SOLN Apply 1-2 drops to eye 3 (three) times daily. 1-2 drops  in RIGHT eye tid as needed   Yes Historical Provider, MD  insulin aspart (NOVOLOG) 100 UNIT/ML injection Inject 0-9 Units into the skin 3 (three) times daily with meals. Patient taking differently: Inject 0-10 Units into the skin 3 (three) times daily with meals. Sliding scale 09/16/14  Yes Florencia Reasons, MD  insulin glargine (LANTUS) 100 UNIT/ML injection Inject 0-10 Units into the skin at bedtime.    Yes Historical Provider, MD  lisinopril (ZESTRIL) 2.5 MG tablet Take 2 tablets (5 mg total) by mouth daily. 09/16/14  Yes Florencia Reasons, MD  Magnesium 200 MG TABS Take 400 mg by mouth daily.   Yes Historical Provider, MD  Multiple Vitamin (MULITIVITAMIN  WITH MINERALS) TABS Take 1 tablet by mouth daily.   Yes Historical Provider, MD  oxyCODONE-acetaminophen (PERCOCET/ROXICET) 5-325 MG per tablet Take 1-2 tablets by mouth every 4 (four) hours as needed for moderate pain. Patient taking differently: Take 0.5-1 tablets by mouth every 4 (four) hours as needed for moderate pain.  09/16/14  Yes Florencia Reasons, MD  rOPINIRole (REQUIP) 0.25 MG tablet Take 1 tablet (0.25 mg total) by mouth at bedtime. Patient taking differently: Take 0.25 mg by mouth every evening.  09/16/14  Yes Florencia Reasons, MD  atorvastatin (LIPITOR) 20 MG tablet Take 1 tablet (20 mg total) by mouth daily at 6 PM. Patient not taking: Reported on 12/15/2014 09/16/14   Florencia Reasons, MD  calcium-vitamin D (OSCAL WITH D) 500-200 MG-UNIT per tablet Take 2 tablets by mouth daily.    Historical Provider, MD  cephALEXin (KEFLEX) 500 MG capsule Take 1 capsule (500 mg total) by mouth 2 (two) times daily. 10/20/14   Merryl Hacker, MD  esomeprazole (NEXIUM) 20 MG capsule Take 20 mg by mouth daily at 12 noon.    Historical Provider, MD  nystatin (MYCOSTATIN) 100000 UNIT/ML suspension Take 5 mLs (500,000 Units total) by mouth 4 (four) times daily. Patient not taking: Reported on 12/15/2014 09/16/14   Florencia Reasons, MD  senna-docusate (SENOKOT-S) 8.6-50 MG per tablet Take 2 tablets by mouth 2 (two) times daily. Patient not taking: Reported on 12/15/2014 09/16/14   Florencia Reasons, MD  traMADol (ULTRAM) 50 MG tablet Take 1-2 tablets (50-100 mg total) by mouth 2 (two) times daily as needed for moderate pain. Patient not taking: Reported on 12/15/2014 09/16/14   Florencia Reasons, MD     Family History  Problem Relation Age of Onset  . Stroke      No family history of such  . Congenital heart disease      No family history of such  . Anesthesia problems Neg Hx   . Diabetes Maternal Grandfather     History   Social History  . Marital Status: Married    Spouse Name: N/A  . Number of Children: 5  . Years of Education: 14   Occupational  History  .     Social History Main Topics  . Smoking status: Never Smoker   . Smokeless tobacco: Never Used  . Alcohol Use: No  . Drug Use: No  . Sexual Activity: No   Other Topics Concern  . None   Social History Narrative     Review of Systems: A 12 point ROS discussed and pertinent positives are indicated in the HPI above.  All other systems are negative.  Review of Systems  Constitutional: Negative for activity change and appetite change.  HENT: Negative.   Eyes: Negative.   Respiratory: Negative for cough, chest tightness and shortness  of breath.   Cardiovascular: Negative for chest pain and leg swelling.  Gastrointestinal: Negative for abdominal pain.  Musculoskeletal: Negative for back pain and arthralgias.  Skin: Negative.   Neurological: Positive for syncope and light-headedness. Negative for facial asymmetry, speech difficulty and numbness.  Psychiatric/Behavioral: Negative.     Vital Signs: BP 144/58 mmHg  Pulse 58  Temp(Src) 97.6 F (36.4 C)  Resp 16  SpO2 98%  Physical Exam  Constitutional: She is oriented to person, place, and time. She appears well-developed and well-nourished.  HENT:  Head: Normocephalic and atraumatic.  Eyes: EOM are normal. Pupils are equal, round, and reactive to light.  Neck: Normal range of motion. Neck supple.  Cardiovascular: Normal rate, regular rhythm and normal heart sounds.   No murmur heard. Pulmonary/Chest: Effort normal and breath sounds normal. She has no wheezes.  Abdominal: Soft. Bowel sounds are normal.  Musculoskeletal: Normal range of motion.  Neurological: She is alert and oriented to person, place, and time.  Skin: Skin is warm and dry.  Psychiatric: She has a normal mood and affect. Her behavior is normal. Judgment and thought content normal.  Nursing note and vitals reviewed.   Mallampati Score:  MD Evaluation Airway: WNL Heart: WNL Abdomen: WNL Chest/ Lungs: WNL ASA  Classification:  3 Mallampati/Airway Score: Two  Imaging: Ir Radiologist Eval & Mgmt  12/09/2014   EXAM: ESTABLISHED PATIENT OFFICE VISIT  CHIEF COMPLAINT: Left-sided weakness related to right-sided middle cerebral artery occlusion followed by endovascular complete revascularization with thrombectomy.  Current Pain Level:  HISTORY OF PRESENT ILLNESS: The patient is a 80 year old, right-handed lady who is status post endovascular complete revascularization of occluded right middle cerebral artery using a chemical thrombolysis and mechanical thrombectomy in early March of this year.  She returns today for follow-up accompanied by her husband and her daughter.  According to all of them, she has made significant improvement after discharge at a rehab facility and also continuing on with physical therapy at home.  According to her husband, the patient is able to do nearly all her normal activities except for being less active on account of her depression. There have been times when the patient has been emotional.  The left arm and leg weakness has significantly improved. However the patient still uses a cane occasionally to ensure that she doesn't fall.  Otherwise the patient is able to involve herself with her usual daily activities with minimal help.  Her appetite has improved somewhat with the patient having gained approximately 2 lb. Her normal weight is around 125 lb.  At home the patient passes much of her time sitting and watching TV.  She has no problems swallowing or chewing food. She denies any visual aberrations, seizures, or loss of consciousness. Following her discharge home from the rehab, the patient was brought back in late March with a small right frontal subcortical ischemic infarct involving the corona radiata from which she has made significant improvement.  On close questioning, the patient denies any pathological symptoms pertaining to other systems.  She denies any chills, fevers, or rigors. She denies any  chest pain shortness of breath or coughing of blood. She denies any abdominal pain, or nausea or vomiting or passing of blood in her stool. Her past medical history, her surgical history, her social history and her family history are otherwise as per at the time of her admission recently.  Patient is reportedly allergic to gabapentin and codeine.  Her present medications are Eliquis. Brimonidine ophthalmic solution. Calcium  citrate with vitamin D tablets. Feeding supplement Glucerna. Insulin. Lisinopril. Magnesium. Multivitamins. Oxycodone. Requip at bedtime. Tramadol.  PHYSICAL EXAMINATION: Brief clinical examination reveals that the patient to be in no acute distress. Affect slightly flat although properly responsive. The patient does occasionally smile. Patient is oriented to time, place, space. Speech and comprehension within normal limits.  Minimal left-sided facial droop. Power in the left upper and left lower extremity 5- / 5.  Pupils 3 mm reactive sluggishly. Eye range of movement within normal limits.  No visual deficit on gross confrontation.  Tongue in the midline.  Coordination of finger-to-nose within normal limits bilaterally. Station and gait not tested.  ASSESSMENT AND PLAN: Patient is status post endovascular complete revascularization of occluded right middle cerebral artery with a mechanical thrombectomy in early March. Patient with significant improvement as per family. However still appears to have reactive depression although that is also improving.  Patient has been strongly advised to improve her level of activity such as reading, going to the malls and keeping herself. Her previous neuroimaging was reviewed. Given the fact the patient had a second ischemic stroke following the first, a formal catheter angiogram will be performed in order to evaluate for possible extra or intracranial narrowing of the anterior circulation, and also to evaluate the left sided intracranial cerebrovascular  supply.  The patient and family are agreeable to this.  For this, the patient will have to stop her Eliquis for 48 hours, and the bridge with 81 mg of aspirin for those two days. Her diagnostic catheter angiogram will be scheduled at the earliest possible. There were asked to call should they have any concerns or questions.   Electronically Signed   By: Luanne Bras M.D.   On: 12/04/2014 14:02    Labs:  CBC:  Recent Labs  09/06/14 0300 09/11/14 0615 09/13/14 1846 10/19/14 2235  WBC 14.6* 7.6 8.6 11.5*  HGB 10.4* 9.4* 9.3* 11.7*  HCT 30.0* 27.9* 28.1* 35.4*  PLT 130* 211 187 218    COAGS:  Recent Labs  09/05/14 0744  INR 1.07  APTT 28    BMP:  Recent Labs  09/06/14 0300 09/11/14 0615 09/13/14 1846 10/19/14 2235  NA 137 138 136 138  K 3.5 4.3 4.5 3.8  CL 106 109 106 101  CO2 24 22 22 27   GLUCOSE 129* 111* 142* 138*  BUN 20 11 12 19   CALCIUM 8.2* 8.8 9.2 10.0  CREATININE 0.84 0.69 0.77 0.81  GFRNONAA 65* 81* 78* 68*  GFRAA 76* >90 >90 79*    LIVER FUNCTION TESTS:  Recent Labs  09/05/14 0744 09/11/14 0615  BILITOT 0.5 0.8  AST 30 28  ALT 23 15  ALKPHOS 71 59  PROT 6.3 4.6*  ALBUMIN 3.6 2.0*    TUMOR MARKERS: No results for input(s): AFPTM, CEA, CA199, CHROMGRNA in the last 8760 hours.  Assessment and Plan:  Syncopal Episode H/O Stroke/TIA Will proceed with  Carotid Arteriogram (possible angioplasty) by Dr. Estanislado Pandy today.  Risks and Benefits discussed with the patient including, but not limited to bleeding, infection, vascular injury, contrast induced renal failure, stroke or even death. All of the patient's questions were answered, patient is agreeable to proceed. Consent signed and in chart.  Thank you for this interesting consult.  I greatly enjoyed meeting SEQUITA WISE and look forward to participating in their care.  SignedNevin Bloodgood 12/17/2014, 9:43 AM   I spent a total of 20 minutes in face to face in clinical  consultation, greater than 50% of which was counseling/coordinating care for Carotid Angiogram

## 2014-12-17 NOTE — Discharge Instructions (Signed)
Cerebral Angiogram, Care After °Refer to this sheet in the next few weeks. These instructions provide you with information on caring for yourself after your procedure. Your health care provider may also give you more specific instructions. Your treatment has been planned according to current medical practices, but problems sometimes occur. Call your health care provider if you have any problems or questions after your procedure. °WHAT TO EXPECT AFTER THE PROCEDURE °After your procedure, it is typical to have the following:  °· Small lump at the insertion site. °· Tenderness or bruising at the insertion site. °· Mild headache. °HOME CARE INSTRUCTIONS °· Rest at home for the first day after your procedure. °· Do not drive or operate heavy machinery as directed by your health care provider. °· Do not exercise as directed by your health care provider. °· Do not lift objects more than 10 pounds as directed by your health care provider. °· Take medicines only as directed by your health care provider. °· Follow your health care provider's instructions on: °¨ Incision care. °¨ Bandage (dressing) changes and removal. °· Check daily for signs of infection at the insertion site, such as redness, swelling, or discharge. °· Drink enough water to keep your urine clear or pale yellow. This will help flush out the contrast dye. °· Keep all follow-up visits as directed by your health care provider. This is important. °SEEK MEDICAL CARE IF: °· You have a fever. °· The skin at the insertion site begins to separate. °· You have drainage, redness, swelling, or pain at the insertion site. °· You are dizzy. °· You have a rash. °· You are itchy. °· You have difficulty urinating. °SEEK IMMEDIATE MEDICAL CARE IF: °· You have weakness in the face, arms, or legs.   °· You have difficulty talking or swallowing.   °· You have vision problems.   °· You have sudden swelling or pain at the insertion site. °· You have a rash.   °· You have  difficulty using the arm or leg in which the catheter was inserted.   °· You have chest pain. °· You have difficulty breathing. °Document Released: 12/01/2013 Document Reviewed: 07/30/2013 °[employer]® Patient Information ©2015 [employer]. This information is not intended to replace advice given to you by your health care provider. Make sure you discuss any questions you have with your health care provider. ° °

## 2014-12-18 DIAGNOSIS — Z794 Long term (current) use of insulin: Secondary | ICD-10-CM | POA: Diagnosis not present

## 2014-12-18 DIAGNOSIS — I672 Cerebral atherosclerosis: Secondary | ICD-10-CM | POA: Diagnosis not present

## 2014-12-18 DIAGNOSIS — E1165 Type 2 diabetes mellitus with hyperglycemia: Secondary | ICD-10-CM | POA: Diagnosis not present

## 2014-12-18 DIAGNOSIS — E11649 Type 2 diabetes mellitus with hypoglycemia without coma: Secondary | ICD-10-CM | POA: Diagnosis not present

## 2015-01-21 DIAGNOSIS — Z794 Long term (current) use of insulin: Secondary | ICD-10-CM | POA: Diagnosis not present

## 2015-01-21 DIAGNOSIS — E041 Nontoxic single thyroid nodule: Secondary | ICD-10-CM | POA: Diagnosis not present

## 2015-01-21 DIAGNOSIS — E11649 Type 2 diabetes mellitus with hypoglycemia without coma: Secondary | ICD-10-CM | POA: Diagnosis not present

## 2015-01-21 DIAGNOSIS — E1165 Type 2 diabetes mellitus with hyperglycemia: Secondary | ICD-10-CM | POA: Diagnosis not present

## 2015-02-02 DIAGNOSIS — H4010X0 Unspecified open-angle glaucoma, stage unspecified: Secondary | ICD-10-CM | POA: Diagnosis not present

## 2015-02-17 ENCOUNTER — Telehealth: Payer: Self-pay | Admitting: Radiology

## 2015-02-17 NOTE — Telephone Encounter (Signed)
Patient states that she prefers to schedule for September to schedule follow up with Dr Markus Daft.  Our office will contact patient when his September schedule has been finalized.  Earnstine Meinders Riendeau, RN 02/17/2015 10:46 AM

## 2015-03-17 DIAGNOSIS — Z79899 Other long term (current) drug therapy: Secondary | ICD-10-CM | POA: Diagnosis not present

## 2015-03-17 DIAGNOSIS — Z9181 History of falling: Secondary | ICD-10-CM | POA: Diagnosis not present

## 2015-03-17 DIAGNOSIS — E1139 Type 2 diabetes mellitus with other diabetic ophthalmic complication: Secondary | ICD-10-CM | POA: Diagnosis not present

## 2015-03-17 DIAGNOSIS — E780 Pure hypercholesterolemia: Secondary | ICD-10-CM | POA: Diagnosis not present

## 2015-03-17 DIAGNOSIS — Z681 Body mass index (BMI) 19 or less, adult: Secondary | ICD-10-CM | POA: Diagnosis not present

## 2015-03-17 DIAGNOSIS — I69354 Hemiplegia and hemiparesis following cerebral infarction affecting left non-dominant side: Secondary | ICD-10-CM | POA: Diagnosis not present

## 2015-03-17 DIAGNOSIS — I82403 Acute embolism and thrombosis of unspecified deep veins of lower extremity, bilateral: Secondary | ICD-10-CM | POA: Diagnosis not present

## 2015-03-25 ENCOUNTER — Other Ambulatory Visit (HOSPITAL_COMMUNITY): Payer: Self-pay | Admitting: Diagnostic Radiology

## 2015-03-25 DIAGNOSIS — Z95828 Presence of other vascular implants and grafts: Secondary | ICD-10-CM

## 2015-04-20 ENCOUNTER — Ambulatory Visit
Admission: RE | Admit: 2015-04-20 | Discharge: 2015-04-20 | Disposition: A | Discharge status: Home / Self Care | Payer: Medicare Other | Source: Ambulatory Visit | Attending: Diagnostic Radiology | Admitting: Diagnostic Radiology

## 2015-04-20 DIAGNOSIS — Z95828 Presence of other vascular implants and grafts: Secondary | ICD-10-CM

## 2015-04-20 DIAGNOSIS — Z86718 Personal history of other venous thrombosis and embolism: Secondary | ICD-10-CM | POA: Diagnosis not present

## 2015-04-20 DIAGNOSIS — I639 Cerebral infarction, unspecified: Secondary | ICD-10-CM | POA: Diagnosis not present

## 2015-04-20 NOTE — H&P (Signed)
Referring Physician(s): Dr. Cyndi Bender  Chief Complaint: The patient is seen in follow up today s/p retrievable IVC filter placed 09/09/2014  History of present illness: Christy Simmons is a 80 year old female with history of ischemic CVA with hemorrhagic conversion and found to have bilateral DVT on LE venous duplex at that time. Venous duplex on 09/07/2014 revealed non-occlusive DVT in right popliteal vein and left posterior tibial vein s/p retrievable IVC filter placed 09/09/2014. She was seen in follow-up with Dr. Anselm Pancoast in 10/2014 and she decided at that time to wait to get her IVC filter out while she gained more strength from her stroke. She now presents today to discuss IVC filter removal. The patient is now on Eliquis and wears compression stockings without evidence of bleeding and is ambulating well. She does complain of some LLE pain worse at night and slight swelling.   Past Medical History  Diagnosis Date  . Transient ischemic attack   . HTN (hypertension)   . Hyperlipidemia   . GERD (gastroesophageal reflux disease)   . Heart disease     PFO  . Amnesia 01/10/2013  . Patent foramen ovale with atrial septal aneurysm   . Glaucoma     Hx: of  . Early cataracts, bilateral     Hx: of  . PONV (postoperative nausea and vomiting)   . Family history of anesthesia complication     "My brother has trouble waking up; all of [place] get sick"  . Type II diabetes mellitus   . DVT (deep venous thrombosis)     "BLE; one broke off, went to her brain, caused the stroke" (09/14/2014)  . Thyroid nodule   . Migraine     "stopped in the 1990's"  . Stroke 09/04/2014    "left side weaker since" (09/14/2014)  . Arthritis     "hands" (09/14/2014)    Past Surgical History  Procedure Laterality Date  . US echocardiography  10-13-11    EF 60-65% normal  . Tee without cardioversion  10/30/2011    Procedure: TRANSESOPHAGEAL ECHOCARDIOGRAM (TEE);  Surgeon: Larey Dresser, MD;  Location: [place];  Service:  Cardiovascular;  Laterality: N/A;  . Colonoscopy w/ biopsies and polypectomy      HX;of  . Tonsillectomy    . Tubal ligation    . Lumbar laminectomy/decompression microdiscectomy N/A 09/11/2013    Procedure: LUMBAR LAMINECTOMY/DECOMPRESSION MICRODISCECTOMY 2 LEVELS;  Surgeon: Ophelia Charter, MD;  Location: [place] [facility];  Service: Neurosurgery;  Laterality: N/A;  Lumbar Four-Five Laminectomy and Foraminotomy with redo Lumbar Five-Sacral One diskectomy  . Radiology with anesthesia N/A 09/05/2014    Procedure: RADIOLOGY WITH ANESTHESIA;  Surgeon: Rob Hickman, MD;  Location: [place];  Service: Radiology;  Laterality: N/A;  . Back surgery    . Lumbar disc surgery    . Ivc filter (celect)  09/09/2014    Allergies: Gabapentin and Codeine  Medications: Prior to Admission medications   Medication Sig Start Date End Date Taking? Authorizing Provider  apixaban (ELIQUIS) 2.5 MG TABS tablet Take 1 tablet (2.5 mg total) by mouth 2 (two) times daily. 09/16/14  Yes Florencia Reasons, MD  bisacodyl (DULCOLAX) 10 MG suppository Take 10 mg by mouth daily as needed for moderate constipation.    Yes Historical Provider, MD  bisacodyl (DULCOLAX) 5 MG EC tablet Take 5 mg by mouth daily as needed for moderate constipation (constipation).   Yes Historical Provider, MD  brimonidine (ALPHAGAN) 0.2 % ophthalmic solution Place 1 drop  into both eyes 2 (two) times daily.  11/18/12  Yes Historical Provider, MD  escitalopram (LEXAPRO) 5 MG tablet Take 5 mg by mouth daily.   Yes Historical Provider, MD  feeding supplement, GLUCERNA SHAKE, (GLUCERNA SHAKE) LIQD Take 237 mLs by mouth 3 (three) times daily between meals. Patient taking differently: Take 237 mLs by mouth 2 (two) times daily between meals.  09/16/14  Yes Florencia Reasons, MD  insulin aspart (NOVOLOG) 100 UNIT/ML injection Inject 0-9 Units into the skin 3 (three) times daily with meals. Patient taking differently: Inject 0-10 Units into the skin 3 (three) times daily with  meals. Sliding scale 09/16/14  Yes Florencia Reasons, MD  insulin glargine (LANTUS) 100 UNIT/ML injection Inject 0-10 Units into the skin at bedtime.    Yes Historical Provider, MD  Iron-Vitamins (GERITOL PO) Take 1 tablet by mouth daily.   Yes Historical Provider, MD  lisinopril (ZESTRIL) 2.5 MG tablet Take 2 tablets (5 mg total) by mouth daily. 09/16/14  Yes Florencia Reasons, MD  metFORMIN (GLUCOPHAGE) 500 MG tablet Take 500 mg by mouth 2 (two) times daily with a meal.   Yes Historical Provider, MD  oxyCODONE-acetaminophen (PERCOCET/ROXICET) 5-325 MG per tablet Take 1-2 tablets by mouth every 4 (four) hours as needed for moderate pain. Patient taking differently: Take 0.5-1 tablets by mouth every 4 (four) hours as needed for moderate pain.  09/16/14  Yes Florencia Reasons, MD  rOPINIRole (REQUIP) 0.25 MG tablet Take 1 tablet (0.25 mg total) by mouth at bedtime. Patient taking differently: Take 0.5 mg by mouth at bedtime.  09/16/14  Yes Florencia Reasons, MD  traMADol (ULTRAM) 50 MG tablet Take 50 mg by mouth every 6 (six) hours as needed.   Yes Historical Provider, MD     Family History  Problem Relation Age of Onset  . Stroke      No family history of such  . Congenital heart disease      No family history of such  . Anesthesia problems Neg Hx   . Diabetes Maternal Grandfather     Social History   Social History  . Marital Status: Married    Spouse Name: N/A  . Number of Children: 5  . Years of Education: 14   Occupational History  .     Social History Main Topics  . Smoking status: Never Smoker   . Smokeless tobacco: Never Used  . Alcohol Use: No  . Drug Use: No  . Sexual Activity: No   Other Topics Concern  . Not on file   Social History Narrative   Vital Signs: BP 196/103 mmHg  Pulse 67  Temp(Src) 97.4 F (36.3 C)  Resp 16  SpO2 100%  Physical Exam General: A&Ox3, NAD, ambulates well Ext: Slight non pitting edema LLE compared to RLE, NT to touch  Imaging: No results  found.  Labs:  CBC:  Recent Labs  09/11/14 0615 09/13/14 1846 10/19/14 2235 12/17/14 0930  WBC 7.6 8.6 11.5* 8.4  HGB 9.4* 9.3* 11.7* 11.0*  HCT 27.9* 28.1* 35.4* 33.6*  PLT 211 187 218 242    COAGS:  Recent Labs  09/05/14 0744 12/17/14 0930  INR 1.07 1.10  APTT 28  --     BMP:  Recent Labs  09/11/14 0615 09/13/14 1846 10/19/14 2235 12/17/14 0930  NA 138 136 138 137  K 4.3 4.5 3.8 4.3  CL 109 106 101 103  CO2 22 22 27 26   GLUCOSE 111* 142* 138* 210*  BUN 11  12 19 21*  CALCIUM 8.8 9.2 10.0 9.6  CREATININE 0.69 0.77 0.81 0.78  GFRNONAA 81* 78* 68* >60  GFRAA >90 >90 79* >60    LIVER FUNCTION TESTS:  Recent Labs  09/05/14 0744 09/11/14 0615  BILITOT 0.5 0.8  AST 30 28  ALT 23 15  ALKPHOS 71 59  PROT 6.3 4.6*  ALBUMIN 3.6 2.0*    Assessment: History of ischemic CVA with hemorrhagic conversion History of bilateral DVT LE venous duplex 09/07/14 revealed non-occlusive DVT in right popliteal vein and left posterior tibial vein S/p retrievable IVC filter placed 09/09/2014 Patient is now on Eliquis and wears compression stockings without evidence of bleeding She does complain of some LLE pain worse at night and slight swelling We will contact her PCP Dr. Tobie Lords to have a LE venous duplex study ordered and based on those results she will be scheduled for a filter retrieval with Dr. Anselm Pancoast Risks and Benefits discussed with the patient and her family including, but not limited to bleeding, infection, contrast induced renal failure, filter fracture or migration, strut penetration or inability to remove filter.  All of the patient's questions were answered, patient is agreeable to proceed.    SignedHedy Jacob 04/20/2015, 11:33 AM   Please refer to Dr. Anselm Pancoast attestation of this note for management and plan.

## 2015-04-23 ENCOUNTER — Other Ambulatory Visit: Payer: Self-pay | Admitting: Physician Assistant

## 2015-04-23 DIAGNOSIS — I82403 Acute embolism and thrombosis of unspecified deep veins of lower extremity, bilateral: Secondary | ICD-10-CM

## 2015-04-27 ENCOUNTER — Ambulatory Visit
Admission: RE | Admit: 2015-04-27 | Discharge: 2015-04-27 | Disposition: A | Discharge status: Home / Self Care | Payer: Medicare Other | Source: Ambulatory Visit | Attending: Physician Assistant | Admitting: Physician Assistant

## 2015-04-27 ENCOUNTER — Other Ambulatory Visit: Payer: Self-pay | Admitting: Diagnostic Radiology

## 2015-04-27 DIAGNOSIS — I82403 Acute embolism and thrombosis of unspecified deep veins of lower extremity, bilateral: Secondary | ICD-10-CM

## 2015-04-27 DIAGNOSIS — Z95828 Presence of other vascular implants and grafts: Secondary | ICD-10-CM

## 2015-05-04 DIAGNOSIS — E1165 Type 2 diabetes mellitus with hyperglycemia: Secondary | ICD-10-CM | POA: Diagnosis not present

## 2015-05-04 DIAGNOSIS — Z23 Encounter for immunization: Secondary | ICD-10-CM | POA: Diagnosis not present

## 2015-05-04 DIAGNOSIS — Z794 Long term (current) use of insulin: Secondary | ICD-10-CM | POA: Diagnosis not present

## 2015-05-04 DIAGNOSIS — E042 Nontoxic multinodular goiter: Secondary | ICD-10-CM | POA: Diagnosis not present

## 2015-05-05 DIAGNOSIS — H2513 Age-related nuclear cataract, bilateral: Secondary | ICD-10-CM | POA: Diagnosis not present

## 2015-05-07 ENCOUNTER — Other Ambulatory Visit: Payer: Self-pay | Admitting: Diagnostic Radiology

## 2015-05-07 DIAGNOSIS — Z95828 Presence of other vascular implants and grafts: Secondary | ICD-10-CM

## 2015-05-10 ENCOUNTER — Telehealth: Payer: Self-pay | Admitting: *Deleted

## 2015-05-10 NOTE — Telephone Encounter (Signed)
I s/w Ms. Christy Simmons and she states she has decided to leave the IVC Filter in place, she does not want to remove it at this time.

## 2015-06-15 ENCOUNTER — Other Ambulatory Visit (HOSPITAL_COMMUNITY): Payer: Self-pay | Admitting: Interventional Radiology

## 2015-06-15 DIAGNOSIS — I639 Cerebral infarction, unspecified: Secondary | ICD-10-CM

## 2015-06-30 ENCOUNTER — Ambulatory Visit (HOSPITAL_COMMUNITY)
Admission: RE | Admit: 2015-06-30 | Discharge: 2015-06-30 | Disposition: A | Discharge status: Home / Self Care | Payer: Medicare Other | Source: Ambulatory Visit | Attending: Interventional Radiology | Admitting: Interventional Radiology

## 2015-06-30 ENCOUNTER — Ambulatory Visit (HOSPITAL_COMMUNITY): Payer: Medicare Other

## 2015-06-30 DIAGNOSIS — Z8673 Personal history of transient ischemic attack (TIA), and cerebral infarction without residual deficits: Secondary | ICD-10-CM | POA: Diagnosis not present

## 2015-06-30 DIAGNOSIS — I639 Cerebral infarction, unspecified: Secondary | ICD-10-CM

## 2015-06-30 LAB — POCT I-STAT CREATININE: CREATININE: 1.1 mg/dL — AB (ref 0.44–1.00)

## 2015-06-30 MED ORDER — GADOBENATE DIMEGLUMINE 529 MG/ML IV SOLN
10.0000 mL | Freq: Once | INTRAVENOUS | Status: AC | PRN
  Administered 2015-06-30: 10 mL via INTRAVENOUS

## 2015-07-06 DIAGNOSIS — E042 Nontoxic multinodular goiter: Secondary | ICD-10-CM | POA: Diagnosis not present

## 2015-07-06 DIAGNOSIS — Z794 Long term (current) use of insulin: Secondary | ICD-10-CM | POA: Diagnosis not present

## 2015-07-06 DIAGNOSIS — G2581 Restless legs syndrome: Secondary | ICD-10-CM | POA: Diagnosis not present

## 2015-07-06 DIAGNOSIS — E1165 Type 2 diabetes mellitus with hyperglycemia: Secondary | ICD-10-CM | POA: Diagnosis not present

## 2015-07-13 ENCOUNTER — Telehealth (HOSPITAL_COMMUNITY): Payer: Self-pay

## 2015-07-13 NOTE — Telephone Encounter (Signed)
Called to inform pt that Dr. Estanislado Pandy wants her to follow up in 6 months with another MRI/MRA of the brain. Pt stated that this exam cost about 5000 and that she didn't know if she was going to be able to do the exam in 6 months. She stated that she would give [place] a call in the 6 months and let us know what she decides to do. AW

## 2015-07-27 DIAGNOSIS — I82403 Acute embolism and thrombosis of unspecified deep veins of lower extremity, bilateral: Secondary | ICD-10-CM | POA: Diagnosis not present

## 2015-07-27 DIAGNOSIS — E119 Type 2 diabetes mellitus without complications: Secondary | ICD-10-CM | POA: Diagnosis not present

## 2015-07-27 DIAGNOSIS — M79604 Pain in right leg: Secondary | ICD-10-CM | POA: Diagnosis not present

## 2015-07-27 DIAGNOSIS — Z1389 Encounter for screening for other disorder: Secondary | ICD-10-CM | POA: Diagnosis not present

## 2015-07-27 DIAGNOSIS — E1139 Type 2 diabetes mellitus with other diabetic ophthalmic complication: Secondary | ICD-10-CM | POA: Diagnosis not present

## 2015-07-27 DIAGNOSIS — I69354 Hemiplegia and hemiparesis following cerebral infarction affecting left non-dominant side: Secondary | ICD-10-CM | POA: Diagnosis not present

## 2015-08-05 DIAGNOSIS — J189 Pneumonia, unspecified organism: Secondary | ICD-10-CM | POA: Diagnosis not present

## 2015-09-28 DIAGNOSIS — Z79899 Other long term (current) drug therapy: Secondary | ICD-10-CM | POA: Diagnosis not present

## 2015-09-28 DIAGNOSIS — I10 Essential (primary) hypertension: Secondary | ICD-10-CM | POA: Diagnosis not present

## 2015-09-28 DIAGNOSIS — E039 Hypothyroidism, unspecified: Secondary | ICD-10-CM | POA: Diagnosis not present

## 2015-09-28 DIAGNOSIS — I69354 Hemiplegia and hemiparesis following cerebral infarction affecting left non-dominant side: Secondary | ICD-10-CM | POA: Diagnosis not present

## 2015-09-28 DIAGNOSIS — Z1389 Encounter for screening for other disorder: Secondary | ICD-10-CM | POA: Diagnosis not present

## 2015-09-28 DIAGNOSIS — I82403 Acute embolism and thrombosis of unspecified deep veins of lower extremity, bilateral: Secondary | ICD-10-CM | POA: Diagnosis not present

## 2015-09-28 DIAGNOSIS — E1139 Type 2 diabetes mellitus with other diabetic ophthalmic complication: Secondary | ICD-10-CM | POA: Diagnosis not present

## 2015-09-29 DIAGNOSIS — E042 Nontoxic multinodular goiter: Secondary | ICD-10-CM | POA: Diagnosis not present

## 2015-10-06 DIAGNOSIS — E1121 Type 2 diabetes mellitus with diabetic nephropathy: Secondary | ICD-10-CM | POA: Diagnosis not present

## 2015-10-06 DIAGNOSIS — Z794 Long term (current) use of insulin: Secondary | ICD-10-CM | POA: Diagnosis not present

## 2015-10-06 DIAGNOSIS — G2581 Restless legs syndrome: Secondary | ICD-10-CM | POA: Diagnosis not present

## 2015-10-06 DIAGNOSIS — E1165 Type 2 diabetes mellitus with hyperglycemia: Secondary | ICD-10-CM | POA: Diagnosis not present

## 2015-10-06 DIAGNOSIS — E042 Nontoxic multinodular goiter: Secondary | ICD-10-CM | POA: Diagnosis not present

## 2015-11-02 DIAGNOSIS — H401132 Primary open-angle glaucoma, bilateral, moderate stage: Secondary | ICD-10-CM | POA: Diagnosis not present

## 2015-11-09 DIAGNOSIS — H2513 Age-related nuclear cataract, bilateral: Secondary | ICD-10-CM | POA: Diagnosis not present

## 2015-12-28 DIAGNOSIS — I10 Essential (primary) hypertension: Secondary | ICD-10-CM | POA: Diagnosis not present

## 2015-12-28 DIAGNOSIS — I82403 Acute embolism and thrombosis of unspecified deep veins of lower extremity, bilateral: Secondary | ICD-10-CM | POA: Diagnosis not present

## 2015-12-28 DIAGNOSIS — M79604 Pain in right leg: Secondary | ICD-10-CM | POA: Diagnosis not present

## 2015-12-28 DIAGNOSIS — E11319 Type 2 diabetes mellitus with unspecified diabetic retinopathy without macular edema: Secondary | ICD-10-CM | POA: Diagnosis not present

## 2016-01-03 DIAGNOSIS — M810 Age-related osteoporosis without current pathological fracture: Secondary | ICD-10-CM | POA: Diagnosis not present

## 2016-01-06 ENCOUNTER — Telehealth (HOSPITAL_COMMUNITY): Payer: Self-pay

## 2016-01-06 NOTE — Telephone Encounter (Signed)
Called to schedule 6 month f/u MRI/MRA, left message for pt to return call. AW

## 2016-01-07 DIAGNOSIS — Z794 Long term (current) use of insulin: Secondary | ICD-10-CM | POA: Diagnosis not present

## 2016-01-07 DIAGNOSIS — E1121 Type 2 diabetes mellitus with diabetic nephropathy: Secondary | ICD-10-CM | POA: Diagnosis not present

## 2016-01-07 DIAGNOSIS — E1165 Type 2 diabetes mellitus with hyperglycemia: Secondary | ICD-10-CM | POA: Diagnosis not present

## 2016-01-07 DIAGNOSIS — G2581 Restless legs syndrome: Secondary | ICD-10-CM | POA: Diagnosis not present

## 2016-01-07 DIAGNOSIS — E538 Deficiency of other specified B group vitamins: Secondary | ICD-10-CM | POA: Diagnosis not present

## 2016-01-11 ENCOUNTER — Other Ambulatory Visit (HOSPITAL_COMMUNITY): Payer: Self-pay | Admitting: Interventional Radiology

## 2016-01-11 DIAGNOSIS — I639 Cerebral infarction, unspecified: Secondary | ICD-10-CM

## 2016-01-25 ENCOUNTER — Ambulatory Visit (HOSPITAL_COMMUNITY)
Admission: RE | Admit: 2016-01-25 | Payer: Medicare Other | Source: Ambulatory Visit | Admitting: Interventional Radiology

## 2016-01-25 ENCOUNTER — Ambulatory Visit (HOSPITAL_COMMUNITY): Payer: Medicare Other

## 2016-01-31 DIAGNOSIS — E103293 Type 1 diabetes mellitus with mild nonproliferative diabetic retinopathy without macular edema, bilateral: Secondary | ICD-10-CM | POA: Diagnosis not present

## 2016-01-31 DIAGNOSIS — H401134 Primary open-angle glaucoma, bilateral, indeterminate stage: Secondary | ICD-10-CM | POA: Diagnosis not present

## 2016-01-31 DIAGNOSIS — Z794 Long term (current) use of insulin: Secondary | ICD-10-CM | POA: Diagnosis not present

## 2016-01-31 DIAGNOSIS — H04123 Dry eye syndrome of bilateral lacrimal glands: Secondary | ICD-10-CM | POA: Diagnosis not present

## 2016-02-02 ENCOUNTER — Ambulatory Visit (HOSPITAL_COMMUNITY)
Admission: RE | Admit: 2016-02-02 | Discharge: 2016-02-02 | Disposition: A | Discharge status: Home / Self Care | Payer: Medicare Other | Source: Ambulatory Visit | Attending: Interventional Radiology | Admitting: Interventional Radiology

## 2016-02-02 ENCOUNTER — Ambulatory Visit (HOSPITAL_COMMUNITY): Payer: Medicare Other

## 2016-02-02 DIAGNOSIS — I6782 Cerebral ischemia: Secondary | ICD-10-CM | POA: Insufficient documentation

## 2016-02-02 DIAGNOSIS — I639 Cerebral infarction, unspecified: Secondary | ICD-10-CM

## 2016-02-02 DIAGNOSIS — Z8673 Personal history of transient ischemic attack (TIA), and cerebral infarction without residual deficits: Secondary | ICD-10-CM | POA: Diagnosis not present

## 2016-02-02 DIAGNOSIS — I63311 Cerebral infarction due to thrombosis of right middle cerebral artery: Secondary | ICD-10-CM | POA: Diagnosis not present

## 2016-02-02 DIAGNOSIS — G319 Degenerative disease of nervous system, unspecified: Secondary | ICD-10-CM | POA: Insufficient documentation

## 2016-02-02 LAB — CREATININE, SERUM
Creatinine, Ser: 0.98 mg/dL (ref 0.44–1.00)
GFR calc Af Amer: >60 mL/min (ref >60)
GFR calc non Af Amer: 53 mL/min — ABNORMAL LOW (ref >60)

## 2016-02-02 MED ORDER — GADOBENATE DIMEGLUMINE 529 MG/ML IV SOLN
10.0000 mL | Freq: Once | INTRAVENOUS | Status: AC | PRN
  Administered 2016-02-02: 10 mL via INTRAVENOUS

## 2016-02-08 ENCOUNTER — Telehealth (HOSPITAL_COMMUNITY): Payer: Self-pay

## 2016-02-08 NOTE — Telephone Encounter (Signed)
Pt agreed to f/u in 6 months with a MRI. AW

## 2016-02-16 DIAGNOSIS — L578 Other skin changes due to chronic exposure to nonionizing radiation: Secondary | ICD-10-CM | POA: Diagnosis not present

## 2016-02-16 DIAGNOSIS — R233 Spontaneous ecchymoses: Secondary | ICD-10-CM | POA: Diagnosis not present

## 2016-02-16 DIAGNOSIS — C44629 Squamous cell carcinoma of skin of left upper limb, including shoulder: Secondary | ICD-10-CM | POA: Diagnosis not present

## 2016-02-16 DIAGNOSIS — L570 Actinic keratosis: Secondary | ICD-10-CM | POA: Diagnosis not present

## 2016-03-14 DIAGNOSIS — H2513 Age-related nuclear cataract, bilateral: Secondary | ICD-10-CM | POA: Diagnosis not present

## 2016-03-23 DIAGNOSIS — Z79899 Other long term (current) drug therapy: Secondary | ICD-10-CM | POA: Diagnosis not present

## 2016-03-23 DIAGNOSIS — E1139 Type 2 diabetes mellitus with other diabetic ophthalmic complication: Secondary | ICD-10-CM | POA: Diagnosis not present

## 2016-03-23 DIAGNOSIS — I10 Essential (primary) hypertension: Secondary | ICD-10-CM | POA: Diagnosis not present

## 2016-03-23 DIAGNOSIS — M79604 Pain in right leg: Secondary | ICD-10-CM | POA: Diagnosis not present

## 2016-03-23 DIAGNOSIS — I69354 Hemiplegia and hemiparesis following cerebral infarction affecting left non-dominant side: Secondary | ICD-10-CM | POA: Diagnosis not present

## 2016-03-23 DIAGNOSIS — M791 Myalgia: Secondary | ICD-10-CM | POA: Diagnosis not present

## 2016-03-23 DIAGNOSIS — I82403 Acute embolism and thrombosis of unspecified deep veins of lower extremity, bilateral: Secondary | ICD-10-CM | POA: Diagnosis not present

## 2016-03-27 DIAGNOSIS — E785 Hyperlipidemia, unspecified: Secondary | ICD-10-CM | POA: Diagnosis not present

## 2016-03-27 DIAGNOSIS — I10 Essential (primary) hypertension: Secondary | ICD-10-CM | POA: Diagnosis not present

## 2016-03-27 DIAGNOSIS — Z79899 Other long term (current) drug therapy: Secondary | ICD-10-CM | POA: Diagnosis not present

## 2016-03-27 DIAGNOSIS — K219 Gastro-esophageal reflux disease without esophagitis: Secondary | ICD-10-CM | POA: Diagnosis not present

## 2016-03-27 DIAGNOSIS — I2510 Atherosclerotic heart disease of native coronary artery without angina pectoris: Secondary | ICD-10-CM | POA: Diagnosis not present

## 2016-03-27 DIAGNOSIS — H2512 Age-related nuclear cataract, left eye: Secondary | ICD-10-CM | POA: Diagnosis not present

## 2016-03-27 DIAGNOSIS — Z794 Long term (current) use of insulin: Secondary | ICD-10-CM | POA: Diagnosis not present

## 2016-03-27 DIAGNOSIS — E1136 Type 2 diabetes mellitus with diabetic cataract: Secondary | ICD-10-CM | POA: Diagnosis not present

## 2016-03-27 DIAGNOSIS — Z7901 Long term (current) use of anticoagulants: Secondary | ICD-10-CM | POA: Diagnosis not present

## 2016-03-27 DIAGNOSIS — Z885 Allergy status to narcotic agent status: Secondary | ICD-10-CM | POA: Diagnosis not present

## 2016-03-27 DIAGNOSIS — Z888 Allergy status to other drugs, medicaments and biological substances status: Secondary | ICD-10-CM | POA: Diagnosis not present

## 2016-03-27 DIAGNOSIS — Z7982 Long term (current) use of aspirin: Secondary | ICD-10-CM | POA: Diagnosis not present

## 2016-03-27 DIAGNOSIS — Z8673 Personal history of transient ischemic attack (TIA), and cerebral infarction without residual deficits: Secondary | ICD-10-CM | POA: Diagnosis not present

## 2016-04-10 DIAGNOSIS — H2511 Age-related nuclear cataract, right eye: Secondary | ICD-10-CM | POA: Diagnosis not present

## 2016-04-10 DIAGNOSIS — Z794 Long term (current) use of insulin: Secondary | ICD-10-CM | POA: Diagnosis not present

## 2016-04-10 DIAGNOSIS — K219 Gastro-esophageal reflux disease without esophagitis: Secondary | ICD-10-CM | POA: Diagnosis not present

## 2016-04-10 DIAGNOSIS — I10 Essential (primary) hypertension: Secondary | ICD-10-CM | POA: Diagnosis not present

## 2016-04-10 DIAGNOSIS — Z7901 Long term (current) use of anticoagulants: Secondary | ICD-10-CM | POA: Diagnosis not present

## 2016-04-10 DIAGNOSIS — E119 Type 2 diabetes mellitus without complications: Secondary | ICD-10-CM | POA: Diagnosis not present

## 2016-04-10 DIAGNOSIS — Z7982 Long term (current) use of aspirin: Secondary | ICD-10-CM | POA: Diagnosis not present

## 2016-04-10 DIAGNOSIS — G2581 Restless legs syndrome: Secondary | ICD-10-CM | POA: Diagnosis not present

## 2016-04-26 DIAGNOSIS — Z794 Long term (current) use of insulin: Secondary | ICD-10-CM | POA: Diagnosis not present

## 2016-04-26 DIAGNOSIS — E1165 Type 2 diabetes mellitus with hyperglycemia: Secondary | ICD-10-CM | POA: Diagnosis not present

## 2016-05-01 DIAGNOSIS — E119 Type 2 diabetes mellitus without complications: Secondary | ICD-10-CM | POA: Diagnosis not present

## 2016-05-01 DIAGNOSIS — Z961 Presence of intraocular lens: Secondary | ICD-10-CM | POA: Diagnosis not present

## 2016-05-01 DIAGNOSIS — H40003 Preglaucoma, unspecified, bilateral: Secondary | ICD-10-CM | POA: Diagnosis not present

## 2016-05-09 DIAGNOSIS — Z961 Presence of intraocular lens: Secondary | ICD-10-CM | POA: Diagnosis not present

## 2016-08-08 ENCOUNTER — Telehealth (HOSPITAL_COMMUNITY): Payer: Self-pay

## 2016-08-08 NOTE — Telephone Encounter (Signed)
Called to schedule f/u, left message for pt to schedule scan. AW

## 2016-08-10 ENCOUNTER — Other Ambulatory Visit (HOSPITAL_COMMUNITY): Payer: Self-pay | Admitting: Interventional Radiology

## 2016-08-10 DIAGNOSIS — I639 Cerebral infarction, unspecified: Secondary | ICD-10-CM

## 2016-10-11 ENCOUNTER — Other Ambulatory Visit: Payer: Self-pay | Admitting: Physician Assistant

## 2016-10-11 DIAGNOSIS — Z86718 Personal history of other venous thrombosis and embolism: Secondary | ICD-10-CM

## 2016-10-19 ENCOUNTER — Other Ambulatory Visit: Payer: Medicare Other

## 2016-11-03 IMAGING — DX DG CHEST 2V
2 series · 2 of 2 positions shown · non-contrast
Comparison: September 05, 2014

CLINICAL DATA: Chest pain

EXAM:
CHEST  2 VIEW

[chest lat]
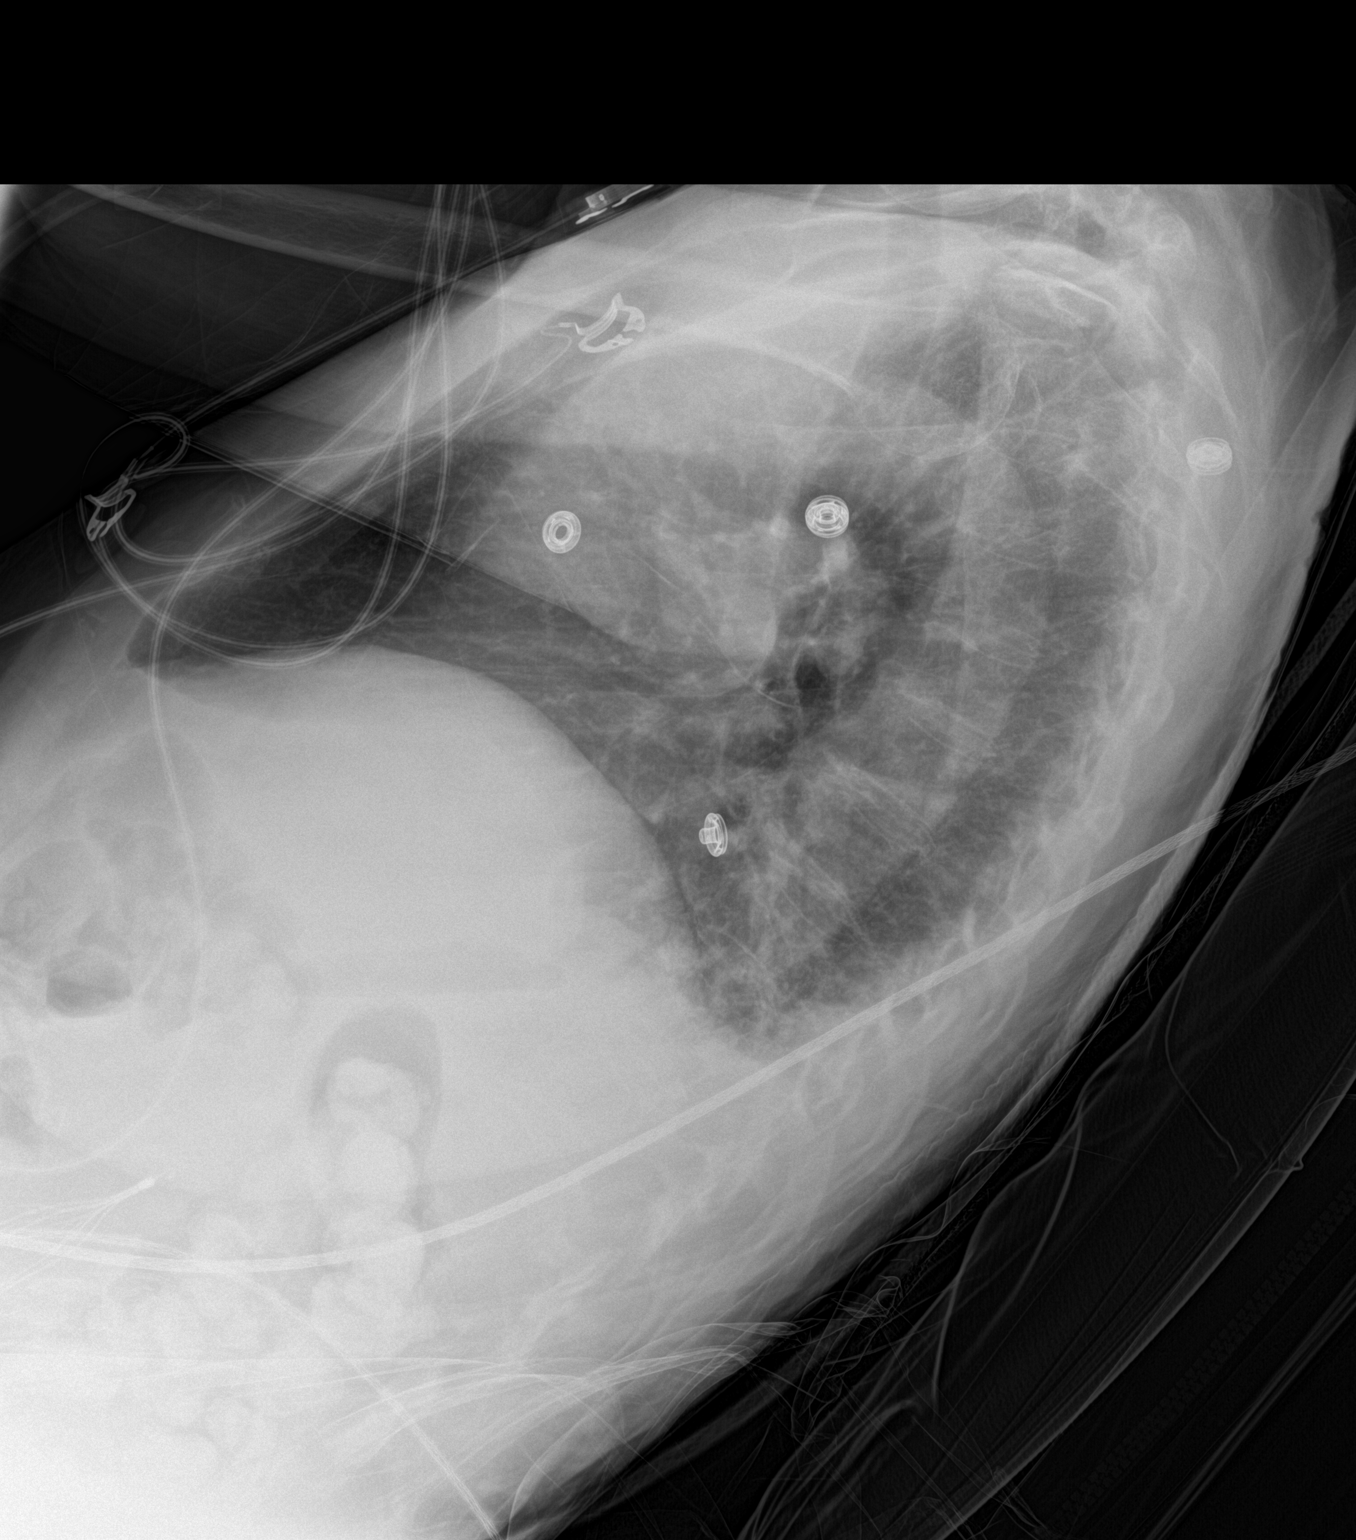

[chest ap]
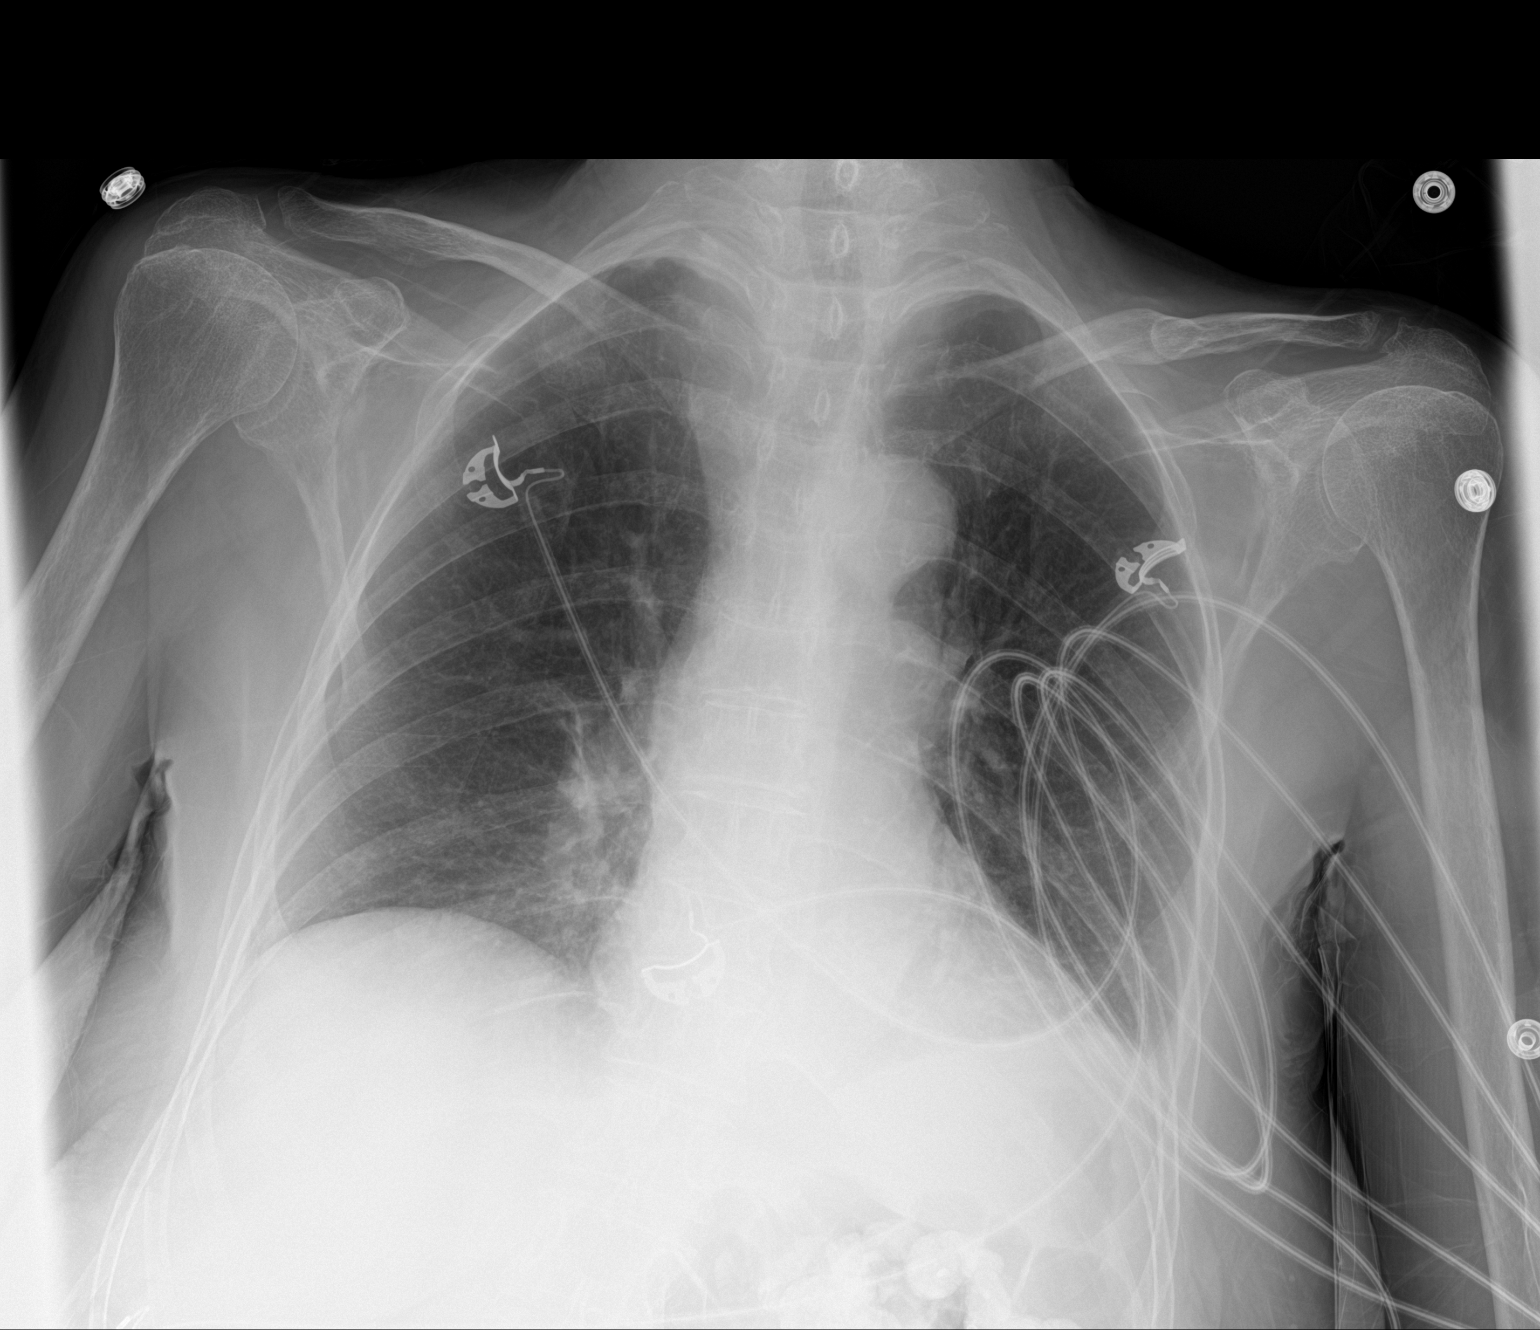

[2 of 2 positions shown; findings below may reference images not displayed]

FINDINGS: Endotracheal tube no longer present. No pneumothorax. Lungs are
clear. Heart size and pulmonary vascularity are normal. No
adenopathy. There is evidence of a prior fracture of the left
clavicle, healed. No acute rib fracture or other rib lesion is
identified on this study. Bones do appear somewhat osteoporotic.
IMPRESSION: No edema or consolidation. No pneumothorax. Healed rib fracture left
clavicle. No acute fracture elsewhere.

## 2016-11-05 IMAGING — CT CT HEAD W/O CM
1 series · 15 of 30 positions shown, 19 images · non-contrast
Comparison: 09/06/2014 MRI, 09/09/2014 CT scan

CLINICAL DATA: Subsequent evaluation for stroke several days ago,
still having left-sided weakness and no appetite

EXAM:
CT HEAD WITHOUT CONTRAST
TECHNIQUE: Contiguous axial images were obtained from the base of the skull
through the vertex without intravenous contrast.

[Series 2: head 5.0 h30s · axial · 0.42mm/px · z∈[-164,-24]mm · 15 of 32 slices shown, 19 images]
[im 2/32  brain]
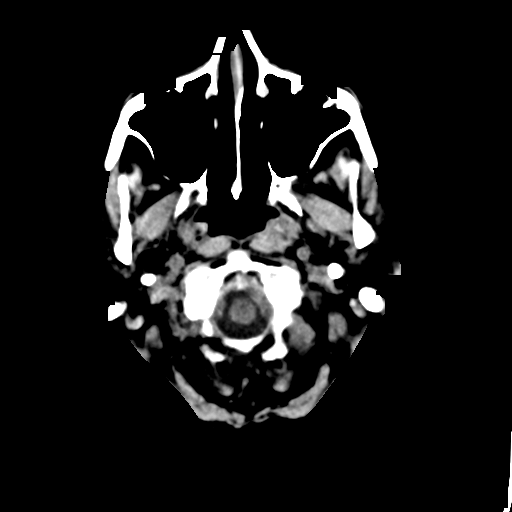
[im 2/32  bone]
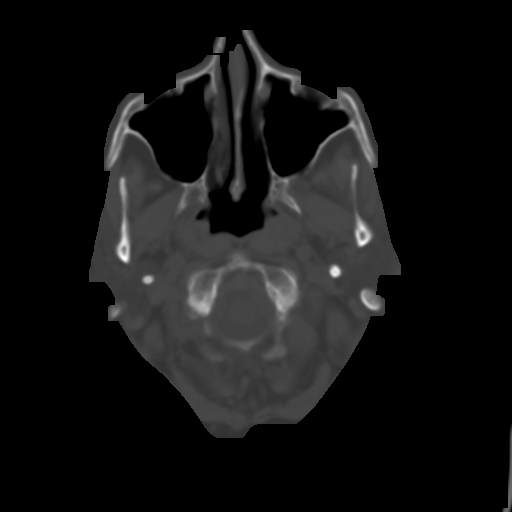
[im 4/32  brain]
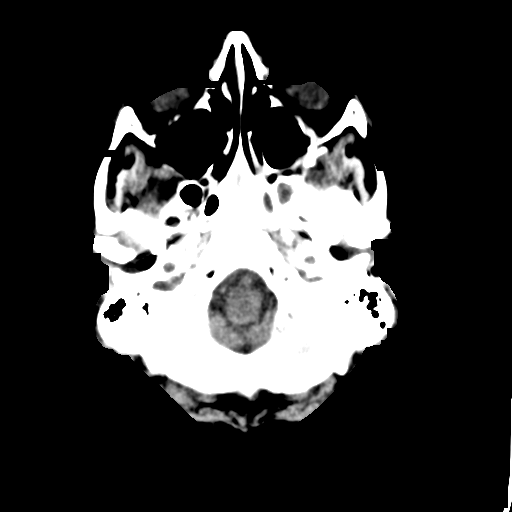
[im 6/32  brain]
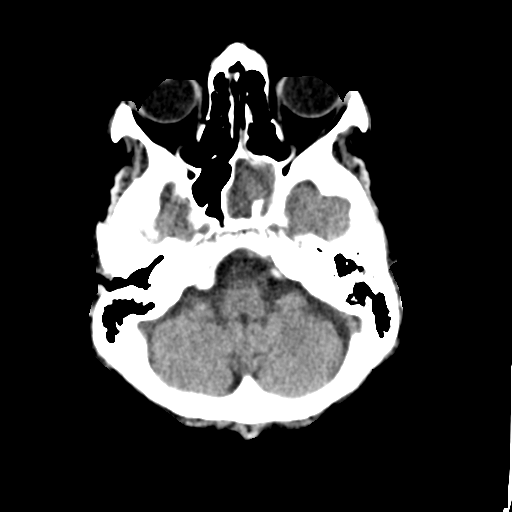
[im 8/32  brain]
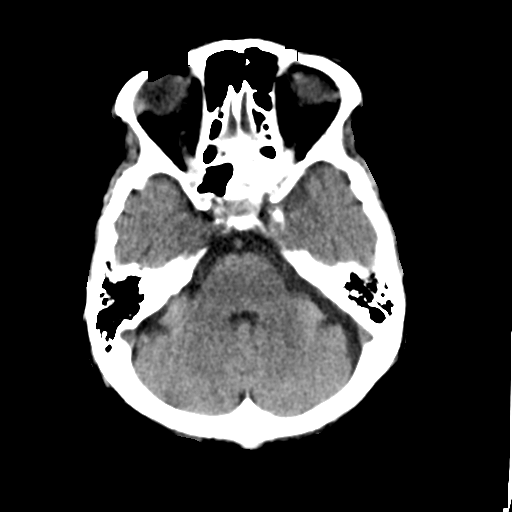
[im 10/32  brain]
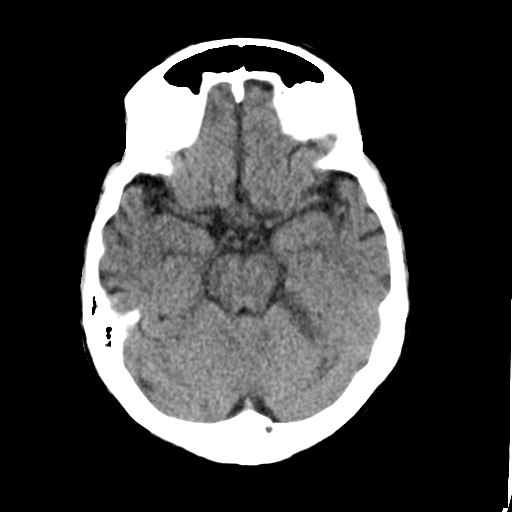
[im 10/32  bone]
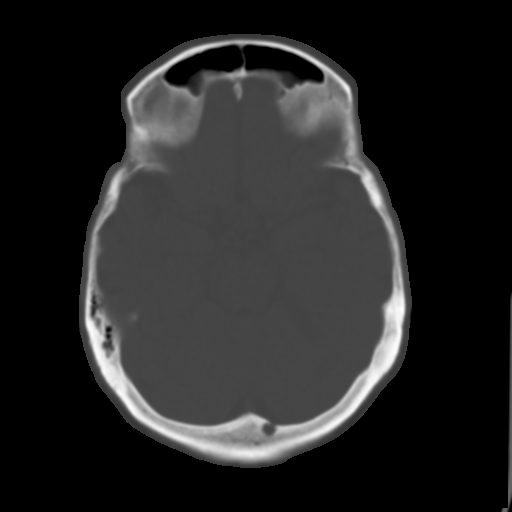
[im 12/32  brain]
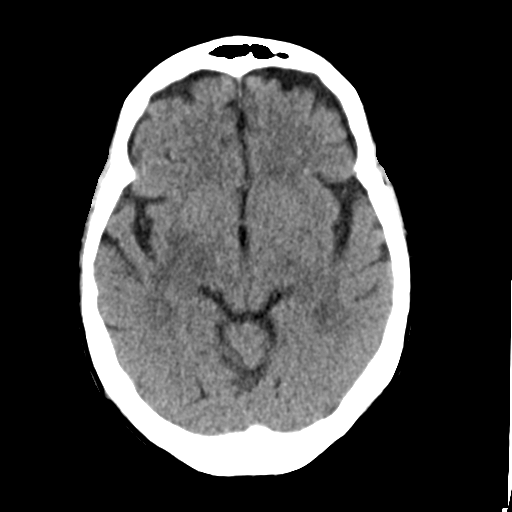
[im 14/32  brain]
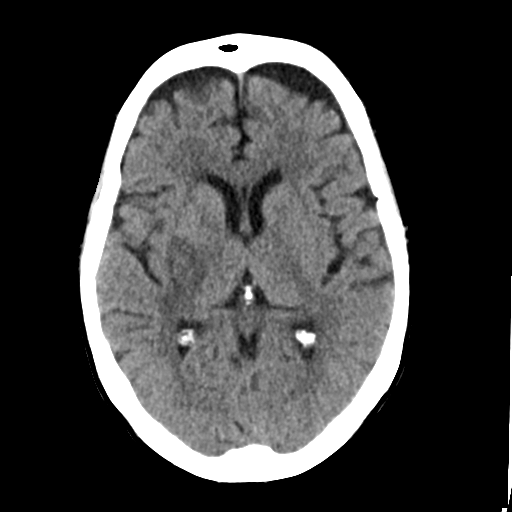
[im 17/32  brain]
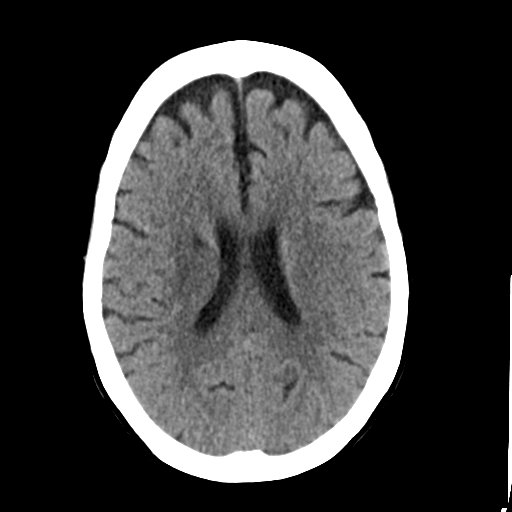
[im 18/32  brain]
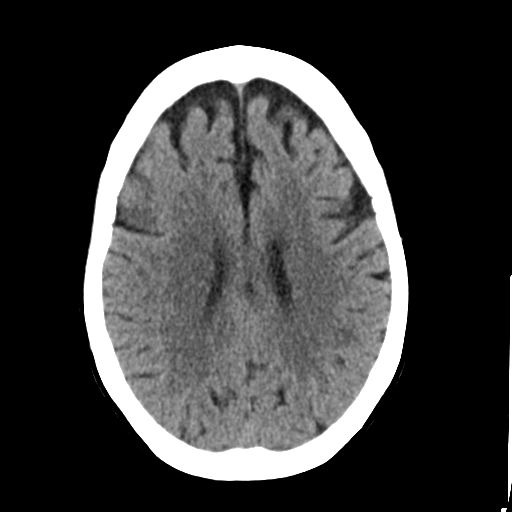
[im 18/32  bone]
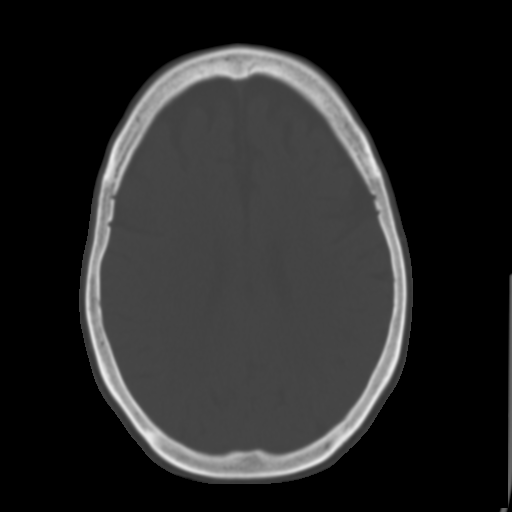
[im 20/32  brain]
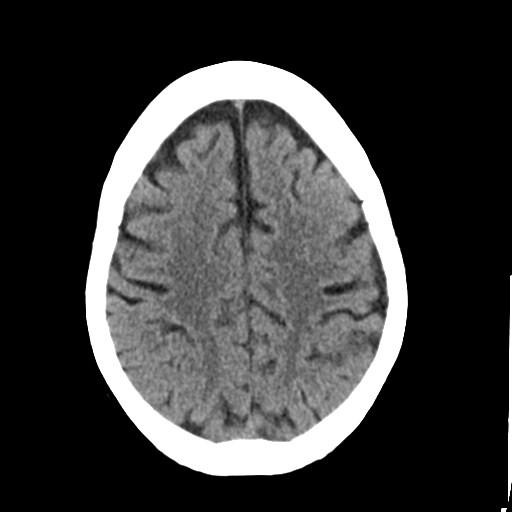
[im 22/32  brain]
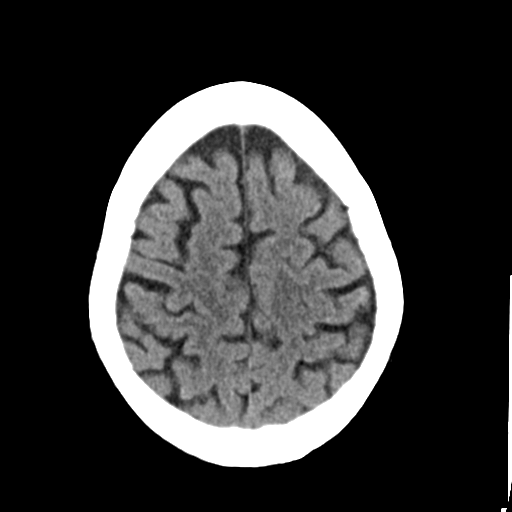
[im 24/32  brain]
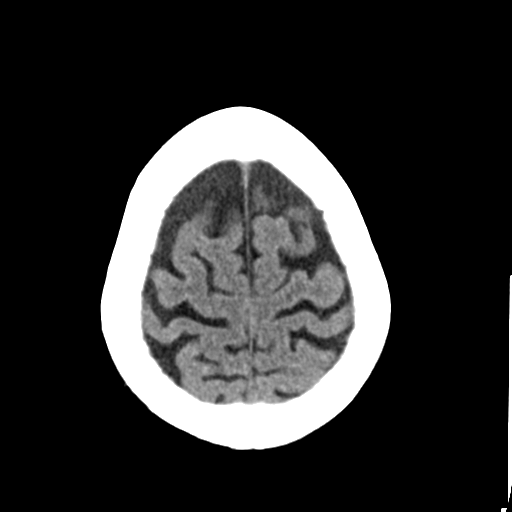
[im 26/32  brain]
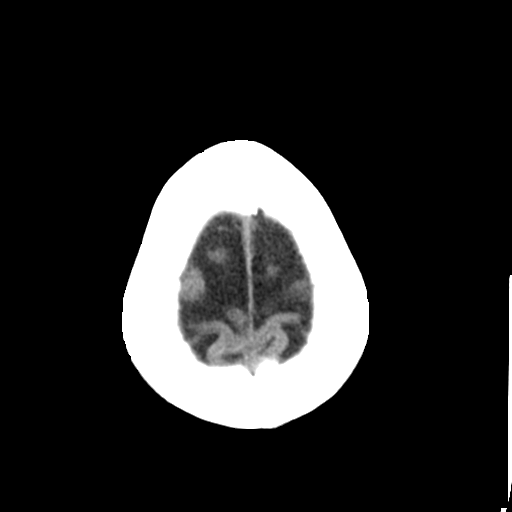
[im 26/32  bone]
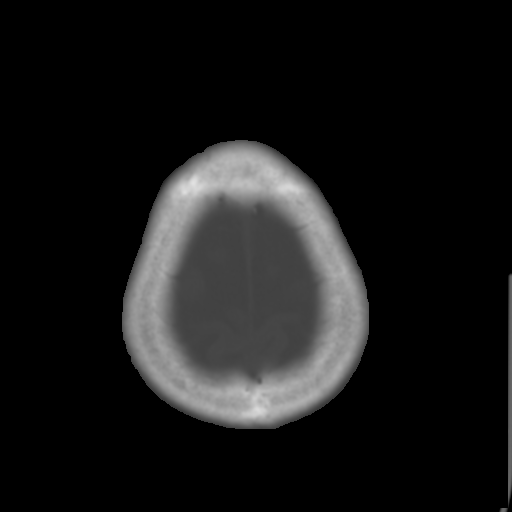
[im 28/32  brain]
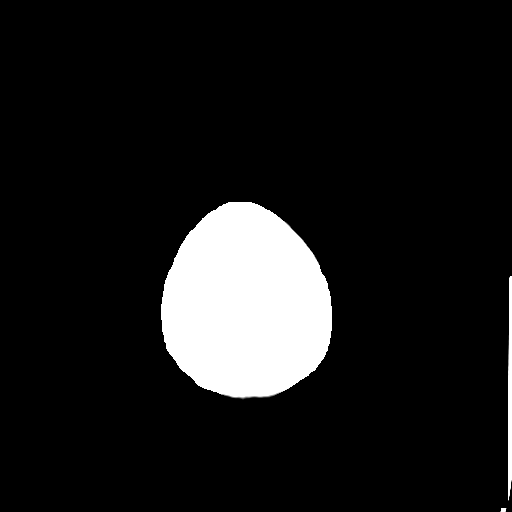
[im 30/32  brain]
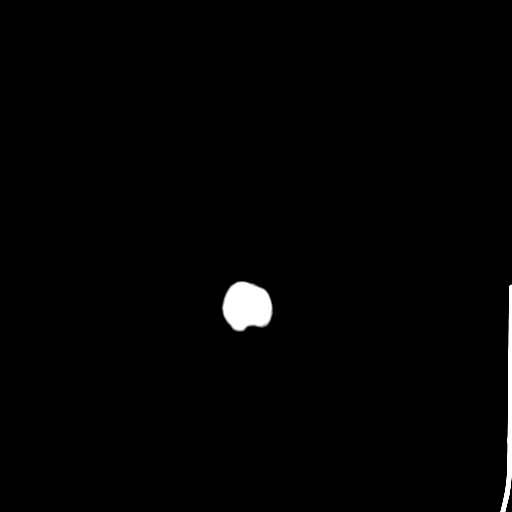

[15 of 30 positions shown; findings below may reference images not displayed]

FINDINGS: Moderate to severe atrophy. Low attenuation in the deep white
matter. Multiple sub cm foci of more pronounced low attenuation in
the deep periventricular white matter consistent with known lacunar
infarcts. No evidence of large vascular territory infarct. Areas of
known embolic infarction throughout the cerebellum as seen on recent
MRI are subtle but visible by CT scan as on the prior CT. Further
evolution of right putaminal hemorrhagic conversion of infarct with
minimal residual hemorrhagic components identified. No hemorrhage or
extra-axial fluid. No hydrocephalus or mass effect.

Left sphenoid sinus is opacified. Remainder of the visualized
paranasal sinuses clear. Calvarium intact.
IMPRESSION: Patient with known bilateral multifocal embolic infarcts, with known
hemorrhagic conversion of infarction right putaminal. Minimal
visible hemorrhage currently identified. No significant interval
change from CT scan performed 09/09/2014 otherwise.

## 2016-11-15 ENCOUNTER — Ambulatory Visit
Admission: RE | Admit: 2016-11-15 | Discharge: 2016-11-15 | Disposition: A | Discharge status: Home / Self Care | Payer: Medicare Other | Source: Ambulatory Visit | Attending: Physician Assistant | Admitting: Physician Assistant

## 2016-11-15 DIAGNOSIS — Z86718 Personal history of other venous thrombosis and embolism: Secondary | ICD-10-CM

## 2016-11-15 HISTORY — PX: IR RADIOLOGIST EVAL & MGMT: IMG5224

## 2016-11-15 NOTE — Progress Notes (Signed)
Chief Complaint: DVT History of  IVC filter placement  Referring Physician(s): Conroy,Nathan  Supervising Physician: Markus Daft  History of Present Illness: Christy Simmons is a 82 y.o. female who is known to our service.  She has history of CVA and underwent a cerebral thrombectomy by Dr. Estanislado Pandy on 09/08/2014.  Following the revascularization she had hemorrhagic conversion and anticoagulation was stopped.  She was then found to have bilateral lower extremity DVTs.   As a result she had an IVC filter placed by Dr. Anselm Pancoast on 09/09/2014 for pulmonary embolism prophylaxis.  She had been scheduled previously to plan removal, however at one visit she decided to wait until she was physically stronger.  She has not had f/u until today to discuss removal, unsure why.  Her only complaint is some pain behind the left knee and some bruising of the left leg.  She is currently anticoagulated and seems to be doing well with no bleeding or other side effects.  She also has some gallbladder issues but states she'd rather just watch what she eats than to have a surgical procedure to remove it.  She denies any fever/chills or recent illness.  Past Medical History:  Diagnosis Date  . Amnesia 01/10/2013  . Arthritis    "hands" (09/14/2014)  . DVT (deep venous thrombosis)    "BLE; one broke off, went to her brain, caused the stroke" (09/14/2014)  . Early cataracts, bilateral    Hx: of  . Family history of anesthesia complication    "My brother has trouble waking up; all of [place] get sick"  . GERD (gastroesophageal reflux disease)   . Glaucoma    Hx: of  . Heart disease    PFO  . HTN (hypertension)   . Hyperlipidemia   . Migraine    "stopped in the 1990's"  . Patent foramen ovale with atrial septal aneurysm   . PONV (postoperative nausea and vomiting)   . Stroke 09/04/2014   "left side weaker since" (09/14/2014)  . Thyroid nodule   . Transient ischemic attack   . Type II diabetes  mellitus     Past Surgical History:  Procedure Laterality Date  . BACK SURGERY    . COLONOSCOPY W/ BIOPSIES AND POLYPECTOMY     HX;of  . IVC Filter ([place])  09/09/2014  . LUMBAR DISC SURGERY    . LUMBAR LAMINECTOMY/DECOMPRESSION MICRODISCECTOMY N/A 09/11/2013   Procedure: LUMBAR LAMINECTOMY/DECOMPRESSION MICRODISCECTOMY 2 LEVELS;  Surgeon: Ophelia Charter, MD;  Location: [facility];  Service: Neurosurgery;  Laterality: N/A;  Lumbar Four-Five Laminectomy and Foraminotomy with redo Lumbar Five-Sacral One diskectomy  . RADIOLOGY WITH ANESTHESIA N/A 09/05/2014   Procedure: RADIOLOGY WITH ANESTHESIA;  Surgeon: Rob Hickman, MD;  Location: [facility];  Service: Radiology;  Laterality: N/A;  . TEE WITHOUT CARDIOVERSION  10/30/2011   Procedure: TRANSESOPHAGEAL ECHOCARDIOGRAM (TEE);  Surgeon: Larey Dresser, MD;  Location: [facility];  Service: Cardiovascular;  Laterality: N/A;  . TONSILLECTOMY    . TUBAL LIGATION    . US ECHOCARDIOGRAPHY  10-13-11   EF 60-65% normal    Allergies: Gabapentin and Codeine  Medications: Prior to Admission medications   Medication Sig Start Date End Date Taking? Authorizing Provider  apixaban (ELIQUIS) 2.5 MG TABS tablet Take 1 tablet (2.5 mg total) by mouth 2 (two) times daily. 09/16/14   Florencia Reasons, MD  bisacodyl (DULCOLAX) 10 MG suppository Take 10 mg by mouth daily as needed for moderate constipation.     Historical  Provider, MD  bisacodyl (DULCOLAX) 5 MG EC tablet Take 5 mg by mouth daily as needed for moderate constipation (constipation).    Historical Provider, MD  brimonidine (ALPHAGAN) 0.2 % ophthalmic solution Place 1 drop into both eyes 2 (two) times daily.  11/18/12   Historical Provider, MD  escitalopram (LEXAPRO) 5 MG tablet Take 5 mg by mouth daily.    Historical Provider, MD  feeding supplement, GLUCERNA SHAKE, (GLUCERNA SHAKE) LIQD Take 237 mLs by mouth 3 (three) times daily between meals. Patient taking differently: Take 237 mLs by mouth 2 (two)  times daily between meals.  09/16/14   Florencia Reasons, MD  insulin aspart (NOVOLOG) 100 UNIT/ML injection Inject 0-9 Units into the skin 3 (three) times daily with meals. Patient taking differently: Inject 0-10 Units into the skin 3 (three) times daily with meals. Sliding scale 09/16/14   Florencia Reasons, MD  insulin glargine (LANTUS) 100 UNIT/ML injection Inject 0-10 Units into the skin at bedtime.     Historical Provider, MD  Iron-Vitamins (GERITOL PO) Take 1 tablet by mouth daily.    Historical Provider, MD  lisinopril (ZESTRIL) 2.5 MG tablet Take 2 tablets (5 mg total) by mouth daily. 09/16/14   Florencia Reasons, MD  metFORMIN (GLUCOPHAGE) 500 MG tablet Take 500 mg by mouth 2 (two) times daily with a meal.    Historical Provider, MD  oxyCODONE-acetaminophen (PERCOCET/ROXICET) 5-325 MG per tablet Take 1-2 tablets by mouth every 4 (four) hours as needed for moderate pain. Patient not taking: Reported on 11/15/2016 09/16/14   Florencia Reasons, MD  rOPINIRole (REQUIP) 0.25 MG tablet Take 1 tablet (0.25 mg total) by mouth at bedtime. Patient not taking: Reported on 11/15/2016 09/16/14   Florencia Reasons, MD  traMADol (ULTRAM) 50 MG tablet Take 50 mg by mouth every 6 (six) hours as needed.    Historical Provider, MD     Family History  Problem Relation Age of Onset  . Stroke      No family history of such  . Congenital heart disease      No family history of such  . Anesthesia problems Neg Hx   . Diabetes Maternal Grandfather     Social History   Social History  . Marital status: Married    Spouse name: N/A  . Number of children: 5  . Years of education: 14   Occupational History  .  Retired   Social History Main Topics  . Smoking status: Never Smoker  . Smokeless tobacco: Never Used  . Alcohol use No  . Drug use: No  . Sexual activity: No   Other Topics Concern  . Not on file   Social History Narrative  . No narrative on file    Review of Systems: A 12 point ROS discussed  Review of Systems  Constitutional:  Negative.   HENT: Negative.   Respiratory: Negative.   Cardiovascular:       Trace edema left leg  Gastrointestinal: Positive for nausea and vomiting.       Issues only when her gallbladder flares up  Musculoskeletal: Negative.        Pain behind the left knee  Skin: Negative.   Hematological: Bruises/bleeds easily.  Psychiatric/Behavioral: Negative.     Vital Signs: There were no vitals taken for this visit.  Physical Exam  Constitutional: She is oriented to person, place, and time. She appears well-developed.  HENT:  Head: Normocephalic and atraumatic.  Eyes: EOM are normal.  Neck: Normal range of motion.  Cardiovascular: Normal rate, regular rhythm and normal heart sounds.   Pulmonary/Chest: Effort normal and breath sounds normal. No respiratory distress. She has no wheezes.  Abdominal: She exhibits no distension. There is no tenderness.  Musculoskeletal:       Legs: Pain behind left knee (red) Trace edema, mostly at ankle (purple) Cluster of telangiectatic varicosities. (blue)  Neurological: She is alert and oriented to person, place, and time.  Skin: Skin is warm and dry.  Psychiatric: She has a normal mood and affect. Her behavior is normal. Judgment and thought content normal.     Imaging: US Venous Img Lower Bilateral  Result Date: 11/15/2016 CLINICAL DATA:  History of lower extremity DVT and patient has an IVC filter. Patient is currently anticoagulated. EXAM: BILATERAL LOWER EXTREMITY VENOUS DOPPLER ULTRASOUND TECHNIQUE: Gray-scale sonography with graded compression, as well as color Doppler and duplex ultrasound were performed to evaluate the lower extremity deep venous systems from the level of the common femoral vein and including the common femoral, femoral, profunda femoral, popliteal and calf veins including the posterior tibial, peroneal and gastrocnemius veins when visible. The superficial great saphenous vein was also interrogated. Spectral Doppler was  utilized to evaluate flow at rest and with distal augmentation maneuvers in the common femoral, femoral and popliteal veins. COMPARISON:  None. FINDINGS: RIGHT LOWER EXTREMITY Common Femoral Vein: No evidence of thrombus. Normal compressibility, respiratory phasicity and response to augmentation. Saphenofemoral Junction: No evidence of thrombus. Normal compressibility and flow on color Doppler imaging. Profunda Femoral Vein: No evidence of thrombus. Normal compressibility and flow on color Doppler imaging. Femoral Vein: No evidence of thrombus. Normal compressibility, respiratory phasicity and response to augmentation. Popliteal Vein: No evidence of thrombus. Normal compressibility, respiratory phasicity and response to augmentation. Calf Veins: Visualized right deep calf veins are patent without thrombus. Other Findings:  None LEFT LOWER EXTREMITY Common Femoral Vein: No evidence of thrombus. Normal compressibility, respiratory phasicity and response to augmentation. Saphenofemoral Junction: No evidence of thrombus. Normal compressibility and flow on color Doppler imaging. Profunda Femoral Vein: No evidence of thrombus. Normal compressibility and flow on color Doppler imaging. Femoral Vein: No evidence of thrombus. Normal compressibility, respiratory phasicity and response to augmentation. Popliteal Vein: No evidence of thrombus. Normal compressibility, respiratory phasicity and response to augmentation. Calf Veins: Visualized left deep calf veins are patent without thrombus. Other: Anechoic structure in the medial left popliteal fossa. This is compatible with a Baker's cyst measuring 4.3 x 1.7 x 2.8 cm. IMPRESSION: No evidence of deep venous thrombosis. Left knee Baker's cyst. Electronically Signed   By: Markus Daft M.D.   On: 11/15/2016 09:19    Labs:  CBC: No results for input(s): WBC, HGB, HCT, PLT in the last 8760 hours.  COAGS: No results for input(s): INR, APTT in the last 8760  hours.  BMP:  Recent Labs  02/02/16 1300  CREATININE 0.98  GFRNONAA 53*  GFRAA >60    LIVER FUNCTION TESTS: No results for input(s): BILITOT, AST, ALT, ALKPHOS, PROT, ALBUMIN in the last 8760 hours.  TUMOR MARKERS: No results for input(s): AFPTM, CEA, CA199, CHROMGRNA in the last 8760 hours.  Assessment:  1. History of DVT s/p IVC Filter 2 years ago.  Venous doppler done today is negative for DVT = Dr. Anselm Pancoast had a lengthy discussion regarding the removal of the filter. He discussed risks and benefits including bleeding, inability to remove the filter, and caval thrombosis.  Given her age and the fact that she is currently doing well on  anticoagulation, her risk of caval thrombosis is low.  Also, given her advanced age, she is a moderate fall risk currently and may eventually need to stop her anticoagulation if her fall risks increases.  With her gallbladder issues, if she were to eventually require a procedure for this, her anticoagulation would need to be held and her filter would protect her.  At this time, we will consider the IVC Filter to be permanent.  2. Knee pain = Baker's cyst seen on doppler behind the left knee.  Dr. Anselm Pancoast discussed aspiration of the cyst with Ms Bora and she would like to proceed. We will contact her PCP Cyndi Bender, PA-C and obtain an order for this and get her scheduled.   Electronically Signed: Murrell Redden PA-C 11/15/2016, 9:29 AM   Please refer to Dr. Moises Blood attestation of this note for management and plan.

## 2017-01-12 ENCOUNTER — Encounter: Payer: Self-pay | Admitting: Diagnostic Radiology

## 2017-02-01 ENCOUNTER — Encounter: Payer: Self-pay | Admitting: Sports Medicine

## 2017-02-01 ENCOUNTER — Ambulatory Visit (INDEPENDENT_AMBULATORY_CARE_PROVIDER_SITE_OTHER): Payer: Medicare Other | Admitting: Sports Medicine

## 2017-02-01 VITALS — BP 161/97 | HR 58 | Resp 14

## 2017-02-01 DIAGNOSIS — L03032 Cellulitis of left toe: Secondary | ICD-10-CM

## 2017-02-01 DIAGNOSIS — Z8673 Personal history of transient ischemic attack (TIA), and cerebral infarction without residual deficits: Secondary | ICD-10-CM

## 2017-02-01 DIAGNOSIS — E1142 Type 2 diabetes mellitus with diabetic polyneuropathy: Secondary | ICD-10-CM | POA: Diagnosis not present

## 2017-02-01 DIAGNOSIS — M79675 Pain in left toe(s): Secondary | ICD-10-CM

## 2017-02-01 MED ORDER — AMOXICILLIN-POT CLAVULANATE 875-125 MG PO TABS: 1.0000 | ORAL_TABLET | Freq: Two times a day (BID) | ORAL | 0 refills | Status: AC
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
## 2017-02-01 NOTE — Patient Instructions (Signed)
Continue with Epsom salt and antibiotic cream to left toe

## 2017-02-01 NOTE — Progress Notes (Signed)
   Subjective:    Patient ID: Christy Simmons, female    DOB: 31-Jul-1935, 82 y.o.   MRN: [id number]  HPI  Woke up Sunday morning and left great toe was bright red and oozing puss.  I have been soaking in epsom salts and using neosporin and wearing clean white socks it seems to have calmed some.     Review of Systems  Neurological: Positive for weakness.  All other systems reviewed and are negative.      Objective:   Physical Exam        Assessment & Plan:

## 2017-02-07 ENCOUNTER — Ambulatory Visit (HOSPITAL_COMMUNITY): Payer: Medicare Other

## 2017-02-07 ENCOUNTER — Encounter (HOSPITAL_COMMUNITY): Payer: Self-pay

## 2017-02-12 ENCOUNTER — Telehealth (HOSPITAL_COMMUNITY): Payer: Self-pay

## 2017-02-12 NOTE — Telephone Encounter (Signed)
Called to reschedule mri. Pt does not want to reschedule. Her husband just had a stroke and she just wants to deal with that at this moment. AW

## 2017-02-22 ENCOUNTER — Ambulatory Visit: Payer: Medicare Other | Admitting: Sports Medicine

## 2018-08-09 ENCOUNTER — Emergency Department: Payer: Medicare Other

## 2018-08-09 ENCOUNTER — Emergency Department
Admission: EM | Admit: 2018-08-09 | Discharge: 2018-08-09 | Disposition: A | Discharge status: Other Acute Inpatient Hospital | Payer: Medicare Other | Attending: Student in an Organized Health Care Education/Training Program | Admitting: Student in an Organized Health Care Education/Training Program

## 2018-08-09 ENCOUNTER — Other Ambulatory Visit: Payer: Self-pay

## 2018-08-09 ENCOUNTER — Encounter: Payer: Self-pay | Admitting: Radiology

## 2018-08-09 ENCOUNTER — Inpatient Hospital Stay (HOSPITAL_COMMUNITY)
Admission: EM | Admit: 2018-08-09 | Discharge: 2018-08-11 | DRG: 184 | Disposition: A | Discharge status: Home Health | Payer: Medicare Other | Source: Other Acute Inpatient Hospital | Attending: General Surgery | Admitting: General Surgery

## 2018-08-09 DIAGNOSIS — G2581 Restless legs syndrome: Secondary | ICD-10-CM | POA: Diagnosis not present

## 2018-08-09 DIAGNOSIS — M19041 Primary osteoarthritis, right hand: Secondary | ICD-10-CM | POA: Diagnosis not present

## 2018-08-09 DIAGNOSIS — I10 Essential (primary) hypertension: Secondary | ICD-10-CM | POA: Insufficient documentation

## 2018-08-09 DIAGNOSIS — K219 Gastro-esophageal reflux disease without esophagitis: Secondary | ICD-10-CM | POA: Diagnosis present

## 2018-08-09 DIAGNOSIS — H409 Unspecified glaucoma: Secondary | ICD-10-CM | POA: Diagnosis not present

## 2018-08-09 DIAGNOSIS — Y9389 Activity, other specified: Secondary | ICD-10-CM | POA: Insufficient documentation

## 2018-08-09 DIAGNOSIS — Q211 Atrial septal defect: Secondary | ICD-10-CM | POA: Diagnosis not present

## 2018-08-09 DIAGNOSIS — R402142 Coma scale, eyes open, spontaneous, at arrival to emergency department: Secondary | ICD-10-CM | POA: Diagnosis present

## 2018-08-09 DIAGNOSIS — S32030A Wedge compression fracture of third lumbar vertebra, initial encounter for closed fracture: Secondary | ICD-10-CM | POA: Diagnosis present

## 2018-08-09 DIAGNOSIS — I69354 Hemiplegia and hemiparesis following cerebral infarction affecting left non-dominant side: Secondary | ICD-10-CM

## 2018-08-09 DIAGNOSIS — Z794 Long term (current) use of insulin: Secondary | ICD-10-CM | POA: Insufficient documentation

## 2018-08-09 DIAGNOSIS — S52612A Displaced fracture of left ulna styloid process, initial encounter for closed fracture: Secondary | ICD-10-CM | POA: Diagnosis not present

## 2018-08-09 DIAGNOSIS — R079 Chest pain, unspecified: Secondary | ICD-10-CM | POA: Diagnosis not present

## 2018-08-09 DIAGNOSIS — S52512A Displaced fracture of left radial styloid process, initial encounter for closed fracture: Secondary | ICD-10-CM | POA: Diagnosis not present

## 2018-08-09 DIAGNOSIS — Z23 Encounter for immunization: Secondary | ICD-10-CM | POA: Diagnosis not present

## 2018-08-09 DIAGNOSIS — E1136 Type 2 diabetes mellitus with diabetic cataract: Secondary | ICD-10-CM | POA: Diagnosis present

## 2018-08-09 DIAGNOSIS — Y92410 Unspecified street and highway as the place of occurrence of the external cause: Secondary | ICD-10-CM | POA: Insufficient documentation

## 2018-08-09 DIAGNOSIS — Z7901 Long term (current) use of anticoagulants: Secondary | ICD-10-CM | POA: Diagnosis not present

## 2018-08-09 DIAGNOSIS — M1812 Unilateral primary osteoarthritis of first carpometacarpal joint, left hand: Secondary | ICD-10-CM | POA: Diagnosis not present

## 2018-08-09 DIAGNOSIS — R51 Headache: Secondary | ICD-10-CM | POA: Diagnosis not present

## 2018-08-09 DIAGNOSIS — Z885 Allergy status to narcotic agent status: Secondary | ICD-10-CM

## 2018-08-09 DIAGNOSIS — M545 Low back pain: Secondary | ICD-10-CM | POA: Diagnosis present

## 2018-08-09 DIAGNOSIS — Z833 Family history of diabetes mellitus: Secondary | ICD-10-CM

## 2018-08-09 DIAGNOSIS — Z86010 Personal history of colonic polyps: Secondary | ICD-10-CM | POA: Diagnosis not present

## 2018-08-09 DIAGNOSIS — S32039A Unspecified fracture of third lumbar vertebra, initial encounter for closed fracture: Secondary | ICD-10-CM | POA: Diagnosis not present

## 2018-08-09 DIAGNOSIS — E041 Nontoxic single thyroid nodule: Secondary | ICD-10-CM | POA: Diagnosis not present

## 2018-08-09 DIAGNOSIS — E119 Type 2 diabetes mellitus without complications: Secondary | ICD-10-CM | POA: Insufficient documentation

## 2018-08-09 DIAGNOSIS — Z8673 Personal history of transient ischemic attack (TIA), and cerebral infarction without residual deficits: Secondary | ICD-10-CM | POA: Diagnosis not present

## 2018-08-09 DIAGNOSIS — Z86718 Personal history of other venous thrombosis and embolism: Secondary | ICD-10-CM

## 2018-08-09 DIAGNOSIS — Y998 Other external cause status: Secondary | ICD-10-CM | POA: Insufficient documentation

## 2018-08-09 DIAGNOSIS — I253 Aneurysm of heart: Secondary | ICD-10-CM | POA: Diagnosis not present

## 2018-08-09 DIAGNOSIS — Z79890 Hormone replacement therapy (postmenopausal): Secondary | ICD-10-CM

## 2018-08-09 DIAGNOSIS — R64 Cachexia: Secondary | ICD-10-CM | POA: Diagnosis not present

## 2018-08-09 DIAGNOSIS — M25562 Pain in left knee: Secondary | ICD-10-CM | POA: Diagnosis not present

## 2018-08-09 DIAGNOSIS — Z9089 Acquired absence of other organs: Secondary | ICD-10-CM

## 2018-08-09 DIAGNOSIS — Z79899 Other long term (current) drug therapy: Secondary | ICD-10-CM | POA: Insufficient documentation

## 2018-08-09 DIAGNOSIS — E785 Hyperlipidemia, unspecified: Secondary | ICD-10-CM | POA: Diagnosis present

## 2018-08-09 DIAGNOSIS — S2242XA Multiple fractures of ribs, left side, initial encounter for closed fracture: Secondary | ICD-10-CM | POA: Diagnosis not present

## 2018-08-09 DIAGNOSIS — R402252 Coma scale, best verbal response, oriented, at arrival to emergency department: Secondary | ICD-10-CM | POA: Diagnosis present

## 2018-08-09 DIAGNOSIS — Z79891 Long term (current) use of opiate analgesic: Secondary | ICD-10-CM

## 2018-08-09 DIAGNOSIS — Z888 Allergy status to other drugs, medicaments and biological substances status: Secondary | ICD-10-CM

## 2018-08-09 DIAGNOSIS — Z681 Body mass index (BMI) 19 or less, adult: Secondary | ICD-10-CM

## 2018-08-09 DIAGNOSIS — M19042 Primary osteoarthritis, left hand: Secondary | ICD-10-CM | POA: Diagnosis not present

## 2018-08-09 DIAGNOSIS — S6992XA Unspecified injury of left wrist, hand and finger(s), initial encounter: Secondary | ICD-10-CM | POA: Diagnosis present

## 2018-08-09 DIAGNOSIS — Z9851 Tubal ligation status: Secondary | ICD-10-CM

## 2018-08-09 DIAGNOSIS — K7689 Other specified diseases of liver: Secondary | ICD-10-CM | POA: Diagnosis not present

## 2018-08-09 DIAGNOSIS — R402362 Coma scale, best motor response, obeys commands, at arrival to emergency department: Secondary | ICD-10-CM | POA: Diagnosis present

## 2018-08-09 LAB — CBC WITH DIFFERENTIAL/PLATELET
ABS IMMATURE GRANULOCYTES: 0.19 10*3/uL — AB (ref 0.00–0.07)
Basophils Absolute: 0 10*3/uL (ref 0.0–0.1)
Basophils Relative: 0 %
Eosinophils Absolute: 0.1 10*3/uL (ref 0.0–0.5)
Eosinophils Relative: 1 %
HCT: 39.1 % (ref 36.0–46.0)
Hemoglobin: 13 g/dL (ref 12.0–15.0)
Immature Granulocytes: 2 %
Lymphocytes Relative: 15 %
Lymphs Abs: 1.4 10*3/uL (ref 0.7–4.0)
MCH: 28.6 pg (ref 26.0–34.0)
MCHC: 33.2 g/dL (ref 30.0–36.0)
MCV: 85.9 fL (ref 80.0–100.0)
Monocytes Absolute: 0.6 10*3/uL (ref 0.1–1.0)
Monocytes Relative: 6 %
NEUTROS ABS: 7.6 10*3/uL (ref 1.7–7.7)
Neutrophils Relative %: 76 %
Platelets: 229 10*3/uL (ref 150–400)
RBC: 4.55 MIL/uL (ref 3.87–5.11)
RDW: 12.8 % (ref 11.5–15.5)
WBC: 10 10*3/uL (ref 4.0–10.5)
nRBC: 0 % (ref 0.0–0.2)

## 2018-08-09 LAB — COMPREHENSIVE METABOLIC PANEL
ALBUMIN: 3.9 g/dL (ref 3.5–5.0)
ALT: 34 U/L (ref 0–44)
AST: 33 U/L (ref 15–41)
Alkaline Phosphatase: 72 U/L (ref 38–126)
Anion gap: 8 (ref 5–15)
BUN: 23 mg/dL (ref 8–23)
CO2: 26 mmol/L (ref 22–32)
CREATININE: 1.07 mg/dL — AB (ref 0.44–1.00)
Calcium: 9.6 mg/dL (ref 8.9–10.3)
Chloride: 99 mmol/L (ref 98–111)
GFR calc Af Amer: 56 mL/min — ABNORMAL LOW (ref >60)
GFR calc non Af Amer: 49 mL/min — ABNORMAL LOW (ref >60)
Glucose, Bld: 235 mg/dL — ABNORMAL HIGH (ref 70–99)
Potassium: 4.2 mmol/L (ref 3.5–5.1)
Sodium: 133 mmol/L — ABNORMAL LOW (ref 135–145)
Total Bilirubin: 0.8 mg/dL (ref 0.3–1.2)
Total Protein: 7 g/dL (ref 6.5–8.1)

## 2018-08-09 LAB — CBG MONITORING, ED: Glucose-Capillary: 141 mg/dL — ABNORMAL HIGH (ref 70–99)

## 2018-08-09 LAB — LIPASE, BLOOD: Lipase: 45 U/L (ref 11–51)

## 2018-08-09 MED ORDER — BUPROPION HCL 75 MG PO TABS: 75.0000 mg | ORAL_TABLET | Freq: Every day | ORAL | Status: DC

## 2018-08-09 MED ORDER — FENTANYL CITRATE (PF) 100 MCG/2ML IJ SOLN
50.0000 ug | Freq: Once | INTRAMUSCULAR | Status: AC
  Administered 2018-08-09: 50 ug via INTRAVENOUS
  Filled 2018-08-09: qty 2

## 2018-08-09 MED ORDER — FENTANYL CITRATE (PF) 100 MCG/2ML IJ SOLN
12.5000 ug | INTRAMUSCULAR | Status: DC | PRN
  Administered 2018-08-09: 12.5 ug via INTRAVENOUS
  Filled 2018-08-09 (×2): qty 2

## 2018-08-09 MED ORDER — ESCITALOPRAM OXALATE 10 MG PO TABS
5.0000 mg | ORAL_TABLET | Freq: Every day | ORAL | Status: DC
  Administered 2018-08-10 – 2018-08-11 (×2): 5 mg via ORAL
  Filled 2018-08-09 (×2): qty 1

## 2018-08-09 MED ORDER — FENTANYL CITRATE (PF) 100 MCG/2ML IJ SOLN: 50.0000 ug | Freq: Once | INTRAMUSCULAR | Status: DC

## 2018-08-09 MED ORDER — BRIMONIDINE TARTRATE 0.2 % OP SOLN: 1.0000 [drp] | Freq: Two times a day (BID) | OPHTHALMIC | Status: DC

## 2018-08-09 MED ORDER — IOHEXOL 300 MG/ML  SOLN
75.0000 mL | Freq: Once | INTRAMUSCULAR | Status: AC | PRN
  Administered 2018-08-09: 75 mL via INTRAVENOUS

## 2018-08-09 MED ORDER — ROPINIROLE HCL 0.25 MG PO TABS: 0.2500 mg | ORAL_TABLET | Freq: Every day | ORAL | Status: DC

## 2018-08-09 MED ORDER — GLUCERNA SHAKE PO LIQD
237.0000 mL | Freq: Three times a day (TID) | ORAL | Status: DC
  Administered 2018-08-10 – 2018-08-11 (×5): 237 mL via ORAL

## 2018-08-09 MED ORDER — MORPHINE SULFATE (PF) 2 MG/ML IV SOLN
1.0000 mg | INTRAVENOUS | Status: DC | PRN
  Administered 2018-08-09 – 2018-08-10 (×2): 1 mg via INTRAVENOUS
  Filled 2018-08-09 (×2): qty 1

## 2018-08-09 MED ORDER — LISINOPRIL 10 MG PO TABS
5.0000 mg | ORAL_TABLET | Freq: Every day | ORAL | Status: DC
  Administered 2018-08-10 – 2018-08-11 (×2): 5 mg via ORAL
  Filled 2018-08-09 (×2): qty 1

## 2018-08-09 MED ORDER — FENTANYL CITRATE (PF) 100 MCG/2ML IJ SOLN
50.0000 ug | Freq: Once | INTRAMUSCULAR | Status: AC
  Administered 2018-08-09: 50 ug via INTRAVENOUS

## 2018-08-09 MED ORDER — ACETAMINOPHEN 500 MG PO TABS
1000.0000 mg | ORAL_TABLET | Freq: Once | ORAL | Status: AC
  Administered 2018-08-09: 1000 mg via ORAL
  Filled 2018-08-09: qty 2
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
## 2018-08-09 NOTE — ED Notes (Signed)
[facility] reviewed. 

## 2018-08-10 ENCOUNTER — Other Ambulatory Visit: Payer: Self-pay

## 2018-08-10 ENCOUNTER — Encounter (HOSPITAL_COMMUNITY): Payer: Self-pay | Admitting: *Deleted

## 2018-08-10 DIAGNOSIS — S2242XA Multiple fractures of ribs, left side, initial encounter for closed fracture: Secondary | ICD-10-CM | POA: Diagnosis not present

## 2018-08-10 DIAGNOSIS — M545 Low back pain: Secondary | ICD-10-CM | POA: Diagnosis not present

## 2018-08-10 LAB — CBC
HCT: 33.9 % — ABNORMAL LOW (ref 36.0–46.0)
Hemoglobin: 11.1 g/dL — ABNORMAL LOW (ref 12.0–15.0)
MCH: 28.1 pg (ref 26.0–34.0)
MCHC: 32.7 g/dL (ref 30.0–36.0)
MCV: 85.8 fL (ref 80.0–100.0)
Platelets: 194 10*3/uL (ref 150–400)
RBC: 3.95 MIL/uL (ref 3.87–5.11)
RDW: 12.9 % (ref 11.5–15.5)
WBC: 9.7 10*3/uL (ref 4.0–10.5)
nRBC: 0 % (ref 0.0–0.2)

## 2018-08-10 LAB — BASIC METABOLIC PANEL
Anion gap: 8 (ref 5–15)
BUN: 19 mg/dL (ref 8–23)
CO2: 23 mmol/L (ref 22–32)
Calcium: 9.2 mg/dL (ref 8.9–10.3)
Chloride: 104 mmol/L (ref 98–111)
Creatinine, Ser: 1.06 mg/dL — ABNORMAL HIGH (ref 0.44–1.00)
GFR calc Af Amer: 57 mL/min — ABNORMAL LOW (ref >60)
GFR calc non Af Amer: 49 mL/min — ABNORMAL LOW (ref >60)
Glucose, Bld: 167 mg/dL — ABNORMAL HIGH (ref 70–99)
Potassium: 4.2 mmol/L (ref 3.5–5.1)
Sodium: 135 mmol/L (ref 135–145)

## 2018-08-10 LAB — MRSA PCR SCREENING: MRSA by PCR: NEGATIVE

## 2018-08-10 LAB — TYPE AND SCREEN
ABO/RH(D): A POS
ABO/RH(D): A POS
Antibody Screen: NEGATIVE
Antibody Screen: NEGATIVE

## 2018-08-10 LAB — GLUCOSE, CAPILLARY
Glucose-Capillary: 142 mg/dL — ABNORMAL HIGH (ref 70–99)
Glucose-Capillary: 115 mg/dL — ABNORMAL HIGH (ref 70–99)
Glucose-Capillary: 110 mg/dL — ABNORMAL HIGH (ref 70–99)
Glucose-Capillary: 136 mg/dL — ABNORMAL HIGH (ref 70–99)

## 2018-08-10 LAB — HEMOGLOBIN A1C
Hgb A1c MFr Bld: 11.8 % — ABNORMAL HIGH (ref 4.8–5.6)
Mean Plasma Glucose: 291.96 mg/dL

## 2018-08-10 LAB — ABO/RH: ABO/RH(D): A POS

## 2018-08-10 MED ORDER — ONDANSETRON 4 MG PO TBDP: 4.0000 mg | ORAL_TABLET | Freq: Four times a day (QID) | ORAL | Status: DC | PRN

## 2018-08-10 MED ORDER — SODIUM CHLORIDE 0.9 % IV SOLN
INTRAVENOUS | Status: DC
  Administered 2018-08-10 – 2018-08-11 (×2): via INTRAVENOUS

## 2018-08-10 MED ORDER — OXYCODONE HCL 5 MG PO TABS
5.0000 mg | ORAL_TABLET | ORAL | Status: DC | PRN
  Administered 2018-08-10 – 2018-08-11 (×5): 5 mg via ORAL
  Filled 2018-08-10 (×5): qty 1

## 2018-08-10 MED ORDER — METHOCARBAMOL 500 MG PO TABS
500.0000 mg | ORAL_TABLET | Freq: Three times a day (TID) | ORAL | Status: DC
  Administered 2018-08-10 – 2018-08-11 (×4): 500 mg via ORAL
  Filled 2018-08-10 (×4): qty 1

## 2018-08-10 MED ORDER — DOCUSATE SODIUM 100 MG PO CAPS
100.0000 mg | ORAL_CAPSULE | Freq: Two times a day (BID) | ORAL | Status: DC
  Administered 2018-08-10 – 2018-08-11 (×3): 100 mg via ORAL
  Filled 2018-08-10 (×3): qty 1

## 2018-08-10 MED ORDER — ACETAMINOPHEN 500 MG PO TABS
1000.0000 mg | ORAL_TABLET | Freq: Four times a day (QID) | ORAL | Status: DC
  Administered 2018-08-10 – 2018-08-11 (×6): 1000 mg via ORAL
  Filled 2018-08-10 (×6): qty 2

## 2018-08-10 MED ORDER — INSULIN ASPART 100 UNIT/ML ~~LOC~~ SOLN
0.0000 [IU] | Freq: Three times a day (TID) | SUBCUTANEOUS | Status: DC
  Administered 2018-08-10 – 2018-08-11 (×2): 2 [IU] via SUBCUTANEOUS
  Administered 2018-08-11: 3 [IU] via SUBCUTANEOUS

## 2018-08-10 MED ORDER — ONDANSETRON HCL 4 MG/2ML IJ SOLN: 4.0000 mg | Freq: Four times a day (QID) | INTRAMUSCULAR | Status: DC | PRN

## 2018-08-10 MED ORDER — INSULIN ASPART 100 UNIT/ML ~~LOC~~ SOLN: 0.0000 [IU] | Freq: Every day | SUBCUTANEOUS | Status: DC

## 2018-08-10 MED ORDER — BISACODYL 10 MG RE SUPP: 10.0000 mg | Freq: Every day | RECTAL | Status: DC | PRN

## 2018-08-10 MED ORDER — PNEUMOCOCCAL VAC POLYVALENT 25 MCG/0.5ML IJ INJ
0.5000 mL | INJECTION | INTRAMUSCULAR | Status: AC
  Administered 2018-08-11: 0.5 mL via INTRAMUSCULAR
  Filled 2018-08-10: qty 0.5

## 2018-08-10 MED ORDER — ENOXAPARIN SODIUM 40 MG/0.4ML ~~LOC~~ SOLN
40.0000 mg | SUBCUTANEOUS | Status: DC
  Administered 2018-08-10 – 2018-08-11 (×2): 40 mg via SUBCUTANEOUS
  Filled 2018-08-10 (×2): qty 0.4

## 2018-08-10 MED ORDER — TRAMADOL HCL 50 MG PO TABS
50.0000 mg | ORAL_TABLET | Freq: Four times a day (QID) | ORAL | Status: DC | PRN
  Administered 2018-08-10 (×2): 50 mg via ORAL
  Filled 2018-08-10 (×2): qty 1

## 2018-08-10 NOTE — Progress Notes (Signed)
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 within normal limits. Bony orbits are intact. Sinuses: Paranasal sinuses are clear.  No hemosinus. Soft tissues: No appreciable soft tissue injury about the face. CT CERVICAL SPINE FINDINGS Alignment: Straightening with slight reversal of the normal cervical lordosis. Trace anterolisthesis of C3 on C4. Skull base and vertebrae: Skull base intact. Normal C1-2 articulations are preserved in the dens is intact. Vertebral body heights maintained. No acute fracture. Soft tissues and spinal canal: Soft tissues of the neck demonstrate no acute finding. No abnormal prevertebral edema. Spinal canal within normal  limits. Enlarged multinodular goiter noted. Vascular calcifications present about the carotid bifurcations. Disc levels: Mild cervical spondylolysis present at C4-5 through C6-7. Degenerative osteoarthritic changes present about the C1-2 articulation. Upper chest: Visualized upper chest demonstrates no acute finding. Visualized lungs are clear. Other: None. IMPRESSION: CT HEAD: 1. No acute intracranial abnormality. 2. Generalized age-related cerebral atrophy with moderate chronic small vessel ischemic disease. CT MAXILLOFACIAL: No acute maxillofacial fracture or other abnormality identified. CT CERVICAL SPINE: 1. No acute traumatic injury within the cervical spine. 2. Mild cervical spondylolysis at C4-5 through C6-7. Electronically Signed   By: Jeannine Boga M.D.   On: 08/09/2018 14:59    Anti-infectives: Anti-infectives (From admission, onward)   None      Assessment/Plan: s/p * No surgery found * Advance diet. Allow fulls today L3 Fx. [facility] recommends lumbar corset. I have ordered this today Rib fxs. Pain control Radius fx. Waiting for hand surgery to see  LOS: 1 day    Autumn Messing III 08/10/2018
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
## 2018-08-10 NOTE — Progress Notes (Signed)
Orthopedic Tech Progress Note Patient Details:  Christy Simmons 04-10-1935 [id number]  Patient ID: Christy Simmons, female   DOB: [birth year], 84 y.o.   MRN: [id number]   [name] 08/10/2018, 3:31 PMCalled [employer] for Lumbar corset.
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
## 2018-08-11 DIAGNOSIS — Y92410 Unspecified street and highway as the place of occurrence of the external cause: Secondary | ICD-10-CM | POA: Diagnosis not present

## 2018-08-11 DIAGNOSIS — Z23 Encounter for immunization: Secondary | ICD-10-CM | POA: Diagnosis not present

## 2018-08-11 DIAGNOSIS — M545 Low back pain: Secondary | ICD-10-CM | POA: Diagnosis present

## 2018-08-11 DIAGNOSIS — S2242XA Multiple fractures of ribs, left side, initial encounter for closed fracture: Secondary | ICD-10-CM | POA: Diagnosis not present

## 2018-08-11 LAB — GLUCOSE, CAPILLARY
Glucose-Capillary: 151 mg/dL — ABNORMAL HIGH (ref 70–99)
Glucose-Capillary: 146 mg/dL — ABNORMAL HIGH (ref 70–99)

## 2018-08-11 MED ORDER — HYDROXYZINE HCL 50 MG/ML IM SOLN
50.0000 mg | Freq: Once | INTRAMUSCULAR | Status: AC
  Administered 2018-08-11: 50 mg via INTRAMUSCULAR
  Filled 2018-08-11: qty 1

## 2018-08-11 MED ORDER — OXYCODONE-ACETAMINOPHEN 5-325 MG PO TABS: 1.0000 | ORAL_TABLET | Freq: Four times a day (QID) | ORAL | 0 refills | Status: DC | PRN

## 2018-08-11 MED ORDER — HYDROXYZINE HCL 50 MG/ML IM SOLN
50.0000 mg | Freq: Once | INTRAMUSCULAR | Status: DC
  Filled 2018-08-11: qty 1

## 2018-08-11 MED ORDER — TRAMADOL HCL 50 MG PO TABS: 50.0000 mg | ORAL_TABLET | Freq: Four times a day (QID) | ORAL | 1 refills | Status: DC | PRN

## 2018-08-11 NOTE — Progress Notes (Signed)
Patient discharged home with daughters and family with walker after reviewing discharge instructions and verberalizing understanding. Family agreed to homecare and Dr follow up appointments.

## 2018-08-11 NOTE — Progress Notes (Signed)
Physical Therapy Treatment Note  Recommend L platform RW for ambulation;   Discussed with Darnelle Bos, RN, and Caryl Pina [facility];   Full PT note to follow,   Roney Marion, PT  Acute Rehabilitation Services Pager [phone] Office [phone]

## 2018-08-11 NOTE — Care Management (Addendum)
Platform ordered for patient to attach to walker at home.   Pt has been instructed on how to attach.  Platform will be delivered to room prior to d/c.

## 2018-08-11 NOTE — Progress Notes (Signed)
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 within normal limits. Bony orbits are intact. Sinuses: Paranasal sinuses are clear.  No hemosinus. Soft tissues: No appreciable soft tissue injury about the face. CT CERVICAL SPINE FINDINGS Alignment: Straightening with slight reversal of the normal cervical lordosis. Trace anterolisthesis of C3 on C4. Skull base and vertebrae: Skull base intact. Normal C1-2 articulations are preserved in the dens is intact. Vertebral body heights maintained. No acute fracture. Soft tissues and spinal canal: Soft tissues of the neck demonstrate no acute finding. No abnormal prevertebral edema. Spinal canal within  normal limits. Enlarged multinodular goiter noted. Vascular calcifications present about the carotid bifurcations. Disc levels: Mild cervical spondylolysis present at C4-5 through C6-7. Degenerative osteoarthritic changes present about the C1-2 articulation. Upper chest: Visualized upper chest demonstrates no acute finding. Visualized lungs are clear. Other: None. IMPRESSION: CT HEAD: 1. No acute intracranial abnormality. 2. Generalized age-related cerebral atrophy with moderate chronic small vessel ischemic disease. CT MAXILLOFACIAL: No acute maxillofacial fracture or other abnormality identified. CT CERVICAL SPINE: 1. No acute traumatic injury within the cervical spine. 2. Mild cervical spondylolysis at C4-5 through C6-7. Electronically Signed   By: Jeannine Boga M.D.   On: 08/09/2018 14:59    Anti-infectives: Anti-infectives (From admission, onward)   None      Assessment/Plan: s/p * No surgery found * Advance diet  Will try to arrange home health PT. If this can be arranged then I think she can go home today. Will need to follow up with Hand surgery and Neurosurgery as outpt. Will need to have corset on when upright  LOS: 2 days    Autumn Messing III 08/11/2018
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
## 2018-08-11 NOTE — Care Management Note (Addendum)
Case Management Note  Patient Details  Name: Christy Simmons MRN: [id number] Date of Birth: [birth year]  Subjective/Objective:                    Action/Plan:  Spoke w patient and family at bedside. They would like to use [facility]. Patient has all needed DME at home including hospital bed, 3/1 WC RW. Referral called into [facility] at [phone] awaiting callback to see if accepted. Received call back from [place] who states that [facility] PT OT [facility] will be Tuesday   Expected Discharge Date:  08/11/18               Expected Discharge Plan:     In-House Referral:     Discharge planning Services  CM Consult  Post Acute Care Choice:  Home Health Choice offered to:  Adult Children  DME Arranged:    DME Agency:     HH Arranged:  PT, OT HH Agency:  [facility]  Status of Service:  In process, will continue to follow  If discussed at Long Length of Stay Meetings, dates discussed:    Additional Comments:  Carles Collet, RN 08/11/2018, 1:11 PM
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
## 2020-11-05 ENCOUNTER — Inpatient Hospital Stay (HOSPITAL_COMMUNITY)
Admission: EM | Admit: 2020-11-05 | Discharge: 2020-11-10 | DRG: 689 | Disposition: A | Discharge status: Home Health | Payer: Medicare Other | Attending: Family Medicine | Admitting: Family Medicine

## 2020-11-05 ENCOUNTER — Encounter (HOSPITAL_COMMUNITY): Payer: Self-pay

## 2020-11-05 ENCOUNTER — Emergency Department (HOSPITAL_COMMUNITY): Payer: Medicare Other

## 2020-11-05 ENCOUNTER — Other Ambulatory Visit: Payer: Self-pay

## 2020-11-05 DIAGNOSIS — I82503 Chronic embolism and thrombosis of unspecified deep veins of lower extremity, bilateral: Secondary | ICD-10-CM | POA: Diagnosis present

## 2020-11-05 DIAGNOSIS — R54 Age-related physical debility: Secondary | ICD-10-CM | POA: Diagnosis present

## 2020-11-05 DIAGNOSIS — N179 Acute kidney failure, unspecified: Secondary | ICD-10-CM | POA: Diagnosis present

## 2020-11-05 DIAGNOSIS — H409 Unspecified glaucoma: Secondary | ICD-10-CM | POA: Diagnosis present

## 2020-11-05 DIAGNOSIS — E860 Dehydration: Secondary | ICD-10-CM | POA: Diagnosis present

## 2020-11-05 DIAGNOSIS — I6622 Occlusion and stenosis of left posterior cerebral artery: Secondary | ICD-10-CM | POA: Diagnosis present

## 2020-11-05 DIAGNOSIS — I10 Essential (primary) hypertension: Secondary | ICD-10-CM | POA: Diagnosis present

## 2020-11-05 DIAGNOSIS — Z823 Family history of stroke: Secondary | ICD-10-CM

## 2020-11-05 DIAGNOSIS — Z79899 Other long term (current) drug therapy: Secondary | ICD-10-CM

## 2020-11-05 DIAGNOSIS — G9341 Metabolic encephalopathy: Secondary | ICD-10-CM | POA: Diagnosis not present

## 2020-11-05 DIAGNOSIS — L8990 Pressure ulcer of unspecified site, unspecified stage: Secondary | ICD-10-CM | POA: Insufficient documentation

## 2020-11-05 DIAGNOSIS — I82403 Acute embolism and thrombosis of unspecified deep veins of lower extremity, bilateral: Secondary | ICD-10-CM | POA: Diagnosis present

## 2020-11-05 DIAGNOSIS — Z66 Do not resuscitate: Secondary | ICD-10-CM | POA: Diagnosis present

## 2020-11-05 DIAGNOSIS — N390 Urinary tract infection, site not specified: Principal | ICD-10-CM | POA: Diagnosis present

## 2020-11-05 DIAGNOSIS — E785 Hyperlipidemia, unspecified: Secondary | ICD-10-CM | POA: Diagnosis present

## 2020-11-05 DIAGNOSIS — E876 Hypokalemia: Secondary | ICD-10-CM | POA: Diagnosis present

## 2020-11-05 DIAGNOSIS — Q211 Atrial septal defect: Secondary | ICD-10-CM

## 2020-11-05 DIAGNOSIS — E1165 Type 2 diabetes mellitus with hyperglycemia: Secondary | ICD-10-CM | POA: Diagnosis present

## 2020-11-05 DIAGNOSIS — I253 Aneurysm of heart: Secondary | ICD-10-CM | POA: Diagnosis present

## 2020-11-05 DIAGNOSIS — L89891 Pressure ulcer of other site, stage 1: Secondary | ICD-10-CM | POA: Diagnosis present

## 2020-11-05 DIAGNOSIS — Z993 Dependence on wheelchair: Secondary | ICD-10-CM

## 2020-11-05 DIAGNOSIS — Z6820 Body mass index (BMI) 20.0-20.9, adult: Secondary | ICD-10-CM

## 2020-11-05 DIAGNOSIS — I129 Hypertensive chronic kidney disease with stage 1 through stage 4 chronic kidney disease, or unspecified chronic kidney disease: Secondary | ICD-10-CM | POA: Diagnosis present

## 2020-11-05 DIAGNOSIS — E1136 Type 2 diabetes mellitus with diabetic cataract: Secondary | ICD-10-CM | POA: Diagnosis present

## 2020-11-05 DIAGNOSIS — D72829 Elevated white blood cell count, unspecified: Secondary | ICD-10-CM

## 2020-11-05 DIAGNOSIS — I160 Hypertensive urgency: Secondary | ICD-10-CM

## 2020-11-05 DIAGNOSIS — A0472 Enterocolitis due to Clostridium difficile, not specified as recurrent: Secondary | ICD-10-CM

## 2020-11-05 DIAGNOSIS — G2581 Restless legs syndrome: Secondary | ICD-10-CM | POA: Diagnosis present

## 2020-11-05 DIAGNOSIS — Z794 Long term (current) use of insulin: Secondary | ICD-10-CM

## 2020-11-05 DIAGNOSIS — G43909 Migraine, unspecified, not intractable, without status migrainosus: Secondary | ICD-10-CM | POA: Diagnosis present

## 2020-11-05 DIAGNOSIS — Z7901 Long term (current) use of anticoagulants: Secondary | ICD-10-CM

## 2020-11-05 DIAGNOSIS — R4182 Altered mental status, unspecified: Secondary | ICD-10-CM | POA: Diagnosis present

## 2020-11-05 DIAGNOSIS — E119 Type 2 diabetes mellitus without complications: Secondary | ICD-10-CM

## 2020-11-05 DIAGNOSIS — Z885 Allergy status to narcotic agent status: Secondary | ICD-10-CM

## 2020-11-05 DIAGNOSIS — L89896 Pressure-induced deep tissue damage of other site: Secondary | ICD-10-CM | POA: Diagnosis present

## 2020-11-05 DIAGNOSIS — M1990 Unspecified osteoarthritis, unspecified site: Secondary | ICD-10-CM | POA: Diagnosis present

## 2020-11-05 DIAGNOSIS — N1831 Chronic kidney disease, stage 3a: Secondary | ICD-10-CM | POA: Diagnosis present

## 2020-11-05 DIAGNOSIS — Z888 Allergy status to other drugs, medicaments and biological substances status: Secondary | ICD-10-CM

## 2020-11-05 DIAGNOSIS — Z833 Family history of diabetes mellitus: Secondary | ICD-10-CM

## 2020-11-05 DIAGNOSIS — R29810 Facial weakness: Secondary | ICD-10-CM | POA: Diagnosis present

## 2020-11-05 DIAGNOSIS — I69354 Hemiplegia and hemiparesis following cerebral infarction affecting left non-dominant side: Secondary | ICD-10-CM

## 2020-11-05 DIAGNOSIS — K219 Gastro-esophageal reflux disease without esophagitis: Secondary | ICD-10-CM | POA: Diagnosis present

## 2020-11-05 DIAGNOSIS — Z20822 Contact with and (suspected) exposure to covid-19: Secondary | ICD-10-CM | POA: Diagnosis present

## 2020-11-05 DIAGNOSIS — E1122 Type 2 diabetes mellitus with diabetic chronic kidney disease: Secondary | ICD-10-CM | POA: Diagnosis present

## 2020-11-05 DIAGNOSIS — E871 Hypo-osmolality and hyponatremia: Secondary | ICD-10-CM | POA: Diagnosis present

## 2020-11-05 DIAGNOSIS — E440 Moderate protein-calorie malnutrition: Secondary | ICD-10-CM | POA: Diagnosis present

## 2020-11-05 DIAGNOSIS — B9620 Unspecified Escherichia coli [E. coli] as the cause of diseases classified elsewhere: Secondary | ICD-10-CM | POA: Diagnosis present

## 2020-11-05 DIAGNOSIS — F0390 Unspecified dementia without behavioral disturbance: Secondary | ICD-10-CM | POA: Diagnosis present

## 2020-11-05 LAB — COMPREHENSIVE METABOLIC PANEL
ALT: 17 U/L (ref 0–44)
AST: 23 U/L (ref 15–41)
Albumin: 3 g/dL — ABNORMAL LOW (ref 3.5–5.0)
Alkaline Phosphatase: 62 U/L (ref 38–126)
Anion gap: 13 (ref 5–15)
BUN: 28 mg/dL — ABNORMAL HIGH (ref 8–23)
CO2: 24 mmol/L (ref 22–32)
Calcium: 9.8 mg/dL (ref 8.9–10.3)
Chloride: 93 mmol/L — ABNORMAL LOW (ref 98–111)
Creatinine, Ser: 1.32 mg/dL — ABNORMAL HIGH (ref 0.44–1.00)
GFR, Estimated: 40 mL/min — ABNORMAL LOW (ref >60)
Glucose, Bld: 348 mg/dL — ABNORMAL HIGH (ref 70–99)
Potassium: 3.2 mmol/L — ABNORMAL LOW (ref 3.5–5.1)
Sodium: 130 mmol/L — ABNORMAL LOW (ref 135–145)
Total Bilirubin: 0.7 mg/dL (ref 0.3–1.2)
Total Protein: 5.9 g/dL — ABNORMAL LOW (ref 6.5–8.1)

## 2020-11-05 LAB — CBC WITH DIFFERENTIAL/PLATELET
Abs Immature Granulocytes: 0.11 10*3/uL — ABNORMAL HIGH (ref 0.00–0.07)
Basophils Absolute: 0.1 10*3/uL (ref 0.0–0.1)
Basophils Relative: 0 %
Eosinophils Absolute: 0.1 10*3/uL (ref 0.0–0.5)
Eosinophils Relative: 0 %
HCT: 42.7 % (ref 36.0–46.0)
Hemoglobin: 14.4 g/dL (ref 12.0–15.0)
Immature Granulocytes: 1 %
Lymphocytes Relative: 19 %
Lymphs Abs: 2.7 10*3/uL (ref 0.7–4.0)
MCH: 29.2 pg (ref 26.0–34.0)
MCHC: 33.7 g/dL (ref 30.0–36.0)
MCV: 86.6 fL (ref 80.0–100.0)
Monocytes Absolute: 1.1 10*3/uL — ABNORMAL HIGH (ref 0.1–1.0)
Monocytes Relative: 8 %
Neutro Abs: 10 10*3/uL — ABNORMAL HIGH (ref 1.7–7.7)
Neutrophils Relative %: 72 %
Platelets: 266 10*3/uL (ref 150–400)
RBC: 4.93 MIL/uL (ref 3.87–5.11)
RDW: 12.7 % (ref 11.5–15.5)
WBC: 14 10*3/uL — ABNORMAL HIGH (ref 4.0–10.5)
nRBC: 0 % (ref 0.0–0.2)

## 2020-11-05 LAB — I-STAT CHEM 8, ED
BUN: 33 mg/dL — ABNORMAL HIGH (ref 8–23)
Calcium, Ion: 1.13 mmol/L — ABNORMAL LOW (ref 1.15–1.40)
Chloride: 97 mmol/L — ABNORMAL LOW (ref 98–111)
Creatinine, Ser: 1 mg/dL (ref 0.44–1.00)
Glucose, Bld: 414 mg/dL — ABNORMAL HIGH (ref 70–99)
HCT: 42 % (ref 36.0–46.0)
Hemoglobin: 14.3 g/dL (ref 12.0–15.0)
Potassium: 3.6 mmol/L (ref 3.5–5.1)
Sodium: 130 mmol/L — ABNORMAL LOW (ref 135–145)
TCO2: 22 mmol/L (ref 22–32)

## 2020-11-05 LAB — APTT: aPTT: 30 seconds (ref 24–36)

## 2020-11-05 LAB — PROTIME-INR
INR: 1.1 (ref 0.8–1.2)
Prothrombin Time: 13.8 seconds (ref 11.4–15.2)

## 2020-11-05 LAB — ETHANOL: Alcohol, Ethyl (B): <10 mg/dL (ref <10)

## 2020-11-05 LAB — CBG MONITORING, ED: Glucose-Capillary: 422 mg/dL — ABNORMAL HIGH (ref 70–99)

## 2020-11-05 MED ORDER — LACTATED RINGERS IV BOLUS
1000.0000 mL | Freq: Once | INTRAVENOUS | Status: AC
  Administered 2020-11-05: 1000 mL via INTRAVENOUS

## 2020-11-05 MED ORDER — IOHEXOL 350 MG/ML SOLN
60.0000 mL | Freq: Once | INTRAVENOUS | Status: AC | PRN
  Administered 2020-11-05: 60 mL via INTRAVENOUS

## 2020-11-05 MED ORDER — LABETALOL HCL 5 MG/ML IV SOLN
10.0000 mg | Freq: Once | INTRAVENOUS | Status: AC
  Administered 2020-11-05: 10 mg via INTRAVENOUS
  Filled 2020-11-05: qty 4

## 2020-11-05 NOTE — ED Notes (Signed)
pts daughter [name] combs updated, pt driving in tomorrow morning from [place], preferred to be called at [phone]

## 2020-11-05 NOTE — ED Notes (Signed)
Dr Marlowe Sax notified of pts increasing bp.

## 2020-11-05 NOTE — Code Documentation (Addendum)
Assisted with Code Stroke paged out.    LKW 11/04/2020 @2000  HX of MCA (right) with left sided weakness.  CBG 422 NIH 15 on eliquis last dose 4/8 am. -CT head and CTA  Not a candidate for tPA nor IR per Dr Curly [place].

## 2020-11-05 NOTE — ED Notes (Signed)
Pt to MRI
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
## 2020-11-05 NOTE — ED Triage Notes (Signed)
Per [facility], pt coming from home LKN 2000 on 11/04/20, pt found tonight vomiting at 15, currently on eliquis for previous stroke, hx of dementia, cbg 433, bp 217/109, hr 42,  pt lives with daughter laura combs [phone]

## 2020-11-05 NOTE — ED Notes (Addendum)
Pts right AC 20g infiltrated during Ct scan, iv removed and ice pack placed at this time. Pt denies any major discomfort.
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
## 2020-11-06 ENCOUNTER — Observation Stay (HOSPITAL_COMMUNITY): Payer: Medicare Other

## 2020-11-06 DIAGNOSIS — Z7189 Other specified counseling: Secondary | ICD-10-CM | POA: Diagnosis not present

## 2020-11-06 DIAGNOSIS — N179 Acute kidney failure, unspecified: Secondary | ICD-10-CM | POA: Diagnosis not present

## 2020-11-06 DIAGNOSIS — Z515 Encounter for palliative care: Secondary | ICD-10-CM

## 2020-11-06 DIAGNOSIS — I160 Hypertensive urgency: Secondary | ICD-10-CM

## 2020-11-06 DIAGNOSIS — Z794 Long term (current) use of insulin: Secondary | ICD-10-CM | POA: Diagnosis not present

## 2020-11-06 DIAGNOSIS — E1169 Type 2 diabetes mellitus with other specified complication: Secondary | ICD-10-CM

## 2020-11-06 DIAGNOSIS — Z66 Do not resuscitate: Secondary | ICD-10-CM

## 2020-11-06 DIAGNOSIS — R4182 Altered mental status, unspecified: Secondary | ICD-10-CM | POA: Diagnosis not present

## 2020-11-06 DIAGNOSIS — L8990 Pressure ulcer of unspecified site, unspecified stage: Secondary | ICD-10-CM | POA: Insufficient documentation

## 2020-11-06 LAB — TSH: TSH: 2.338 u[IU]/mL (ref 0.350–4.500)

## 2020-11-06 LAB — POTASSIUM: Potassium: 3.3 mmol/L — ABNORMAL LOW (ref 3.5–5.1)

## 2020-11-06 LAB — GLUCOSE, CAPILLARY
Glucose-Capillary: 118 mg/dL — ABNORMAL HIGH (ref 70–99)
Glucose-Capillary: 202 mg/dL — ABNORMAL HIGH (ref 70–99)
Glucose-Capillary: 367 mg/dL — ABNORMAL HIGH (ref 70–99)
Glucose-Capillary: 193 mg/dL — ABNORMAL HIGH (ref 70–99)
Glucose-Capillary: 90 mg/dL (ref 70–99)

## 2020-11-06 LAB — HEMOGLOBIN A1C
Hgb A1c MFr Bld: 11.2 % — ABNORMAL HIGH (ref 4.8–5.6)
Mean Plasma Glucose: 274.74 mg/dL

## 2020-11-06 LAB — BASIC METABOLIC PANEL
Anion gap: 10 (ref 5–15)
BUN: 25 mg/dL — ABNORMAL HIGH (ref 8–23)
CO2: 26 mmol/L (ref 22–32)
Calcium: 9.7 mg/dL (ref 8.9–10.3)
Chloride: 97 mmol/L — ABNORMAL LOW (ref 98–111)
Creatinine, Ser: 1.06 mg/dL — ABNORMAL HIGH (ref 0.44–1.00)
GFR, Estimated: 52 mL/min — ABNORMAL LOW (ref >60)
Glucose, Bld: 113 mg/dL — ABNORMAL HIGH (ref 70–99)
Potassium: 2.9 mmol/L — ABNORMAL LOW (ref 3.5–5.1)
Sodium: 133 mmol/L — ABNORMAL LOW (ref 135–145)

## 2020-11-06 LAB — URINALYSIS, ROUTINE W REFLEX MICROSCOPIC
Bilirubin Urine: NEGATIVE
Glucose, UA: >=500 mg/dL — AB
Ketones, ur: 5 mg/dL — AB
Nitrite: NEGATIVE
Protein, ur: >=300 mg/dL — AB
Specific Gravity, Urine: 1.043 — ABNORMAL HIGH (ref 1.005–1.030)
WBC, UA: >50 WBC/hpf — ABNORMAL HIGH (ref 0–5)
pH: 5 (ref 5.0–8.0)

## 2020-11-06 LAB — RAPID URINE DRUG SCREEN, HOSP PERFORMED
Amphetamines: NOT DETECTED
Barbiturates: NOT DETECTED
Benzodiazepines: NOT DETECTED
Cocaine: NOT DETECTED
Opiates: NOT DETECTED
Tetrahydrocannabinol: NOT DETECTED

## 2020-11-06 LAB — VITAMIN B12: Vitamin B-12: 565 pg/mL (ref 180–914)

## 2020-11-06 LAB — CBC
HCT: 36.1 % (ref 36.0–46.0)
Hemoglobin: 12.7 g/dL (ref 12.0–15.0)
MCH: 29.1 pg (ref 26.0–34.0)
MCHC: 35.2 g/dL (ref 30.0–36.0)
MCV: 82.8 fL (ref 80.0–100.0)
Platelets: 275 10*3/uL (ref 150–400)
RBC: 4.36 MIL/uL (ref 3.87–5.11)
RDW: 12.6 % (ref 11.5–15.5)
WBC: 12.7 10*3/uL — ABNORMAL HIGH (ref 4.0–10.5)
nRBC: 0 % (ref 0.0–0.2)

## 2020-11-06 LAB — RESP PANEL BY RT-PCR (FLU A&B, COVID) ARPGX2
Influenza A by PCR: NEGATIVE
Influenza B by PCR: NEGATIVE
SARS Coronavirus 2 by RT PCR: NEGATIVE

## 2020-11-06 LAB — AMMONIA: Ammonia: 22 umol/L (ref 9–35)

## 2020-11-06 LAB — LIPASE, BLOOD: Lipase: 31 U/L (ref 11–51)

## 2020-11-06 LAB — MAGNESIUM: Magnesium: 1.7 mg/dL (ref 1.7–2.4)

## 2020-11-06 LAB — OSMOLALITY: Osmolality: 289 mOsm/kg (ref 275–295)

## 2020-11-06 MED ORDER — AMLODIPINE BESYLATE 10 MG PO TABS
10.0000 mg | ORAL_TABLET | Freq: Every day | ORAL | Status: DC
  Administered 2020-11-07 – 2020-11-10 (×4): 10 mg via ORAL
  Filled 2020-11-06 (×5): qty 1

## 2020-11-06 MED ORDER — ACETAMINOPHEN 325 MG PO TABS
650.0000 mg | ORAL_TABLET | Freq: Four times a day (QID) | ORAL | Status: DC | PRN
  Administered 2020-11-07: 650 mg via ORAL
  Filled 2020-11-06: qty 2

## 2020-11-06 MED ORDER — INSULIN ASPART 100 UNIT/ML ~~LOC~~ SOLN
0.0000 [IU] | Freq: Three times a day (TID) | SUBCUTANEOUS | Status: DC
  Administered 2020-11-06: 5 [IU] via SUBCUTANEOUS
  Administered 2020-11-06: 15 [IU] via SUBCUTANEOUS
  Administered 2020-11-06: 3 [IU] via SUBCUTANEOUS
  Administered 2020-11-07: 8 [IU] via SUBCUTANEOUS
  Administered 2020-11-07 – 2020-11-08 (×2): 3 [IU] via SUBCUTANEOUS
  Administered 2020-11-08 (×2): 8 [IU] via SUBCUTANEOUS
  Administered 2020-11-09: 5 [IU] via SUBCUTANEOUS
  Administered 2020-11-09: 11 [IU] via SUBCUTANEOUS

## 2020-11-06 MED ORDER — AMLODIPINE BESYLATE 5 MG PO TABS
5.0000 mg | ORAL_TABLET | Freq: Every day | ORAL | Status: DC
  Administered 2020-11-06: 5 mg via ORAL
  Filled 2020-11-06: qty 1

## 2020-11-06 MED ORDER — SODIUM CHLORIDE 0.9 % IV SOLN: INTRAVENOUS | Status: DC

## 2020-11-06 MED ORDER — POTASSIUM CHLORIDE CRYS ER 20 MEQ PO TBCR: 40.0000 meq | EXTENDED_RELEASE_TABLET | Freq: Once | ORAL | Status: DC

## 2020-11-06 MED ORDER — INSULIN ASPART 100 UNIT/ML ~~LOC~~ SOLN: 0.0000 [IU] | Freq: Every day | SUBCUTANEOUS | Status: DC

## 2020-11-06 MED ORDER — PROCHLORPERAZINE EDISYLATE 10 MG/2ML IJ SOLN: 5.0000 mg | Freq: Four times a day (QID) | INTRAMUSCULAR | Status: DC | PRN

## 2020-11-06 MED ORDER — ACETAMINOPHEN 650 MG RE SUPP: 650.0000 mg | Freq: Four times a day (QID) | RECTAL | Status: DC | PRN

## 2020-11-06 MED ORDER — SODIUM CHLORIDE 0.9 % IV SOLN
1.0000 g | INTRAVENOUS | Status: DC
  Administered 2020-11-06 – 2020-11-08 (×3): 1 g via INTRAVENOUS
  Filled 2020-11-06 (×3): qty 10

## 2020-11-06 MED ORDER — STROKE: EARLY STAGES OF RECOVERY BOOK
Status: AC
  Filled 2020-11-06: qty 1

## 2020-11-06 MED ORDER — INSULIN GLARGINE 100 UNIT/ML ~~LOC~~ SOLN
25.0000 [IU] | Freq: Every evening | SUBCUTANEOUS | Status: DC
  Administered 2020-11-06: 25 [IU] via SUBCUTANEOUS
  Filled 2020-11-06 (×3): qty 0.25

## 2020-11-06 MED ORDER — HYDRALAZINE HCL 20 MG/ML IJ SOLN
10.0000 mg | INTRAMUSCULAR | Status: DC | PRN
  Administered 2020-11-06: 10 mg via INTRAVENOUS
  Filled 2020-11-06: qty 1

## 2020-11-06 MED ORDER — POTASSIUM CHLORIDE CRYS ER 20 MEQ PO TBCR
40.0000 meq | EXTENDED_RELEASE_TABLET | ORAL | Status: AC
  Administered 2020-11-06: 40 meq via ORAL
  Filled 2020-11-06: qty 2

## 2020-11-06 MED ORDER — APIXABAN 2.5 MG PO TABS
2.5000 mg | ORAL_TABLET | Freq: Two times a day (BID) | ORAL | Status: DC
  Administered 2020-11-06 – 2020-11-10 (×10): 2.5 mg via ORAL
  Filled 2020-11-06 (×11): qty 1

## 2020-11-06 MED ORDER — HYDRALAZINE HCL 25 MG PO TABS
25.0000 mg | ORAL_TABLET | Freq: Three times a day (TID) | ORAL | Status: DC
  Administered 2020-11-06 – 2020-11-07 (×3): 25 mg via ORAL
  Filled 2020-11-06 (×3): qty 1

## 2020-11-06 MED ORDER — SERTRALINE HCL 100 MG PO TABS
100.0000 mg | ORAL_TABLET | Freq: Every day | ORAL | Status: DC
  Administered 2020-11-06 – 2020-11-09 (×5): 100 mg via ORAL
  Filled 2020-11-06 (×6): qty 1

## 2020-11-06 MED ORDER — LIVING WELL WITH DIABETES BOOK
Freq: Once | Status: AC
  Filled 2020-11-06 (×2): qty 1

## 2020-11-06 NOTE — Progress Notes (Signed)
Inpatient Diabetes Program Recommendations  AACE/ADA: New Consensus Statement on Inpatient Glycemic Control (2015)  Target Ranges:  Prepandial:   less than 140 mg/dL      Peak postprandial:   less than 180 mg/dL (1-2 hours)      Critically ill patients:  140 - 180 mg/dL   Lab Results  Component Value Date   GLUCAP 367 (H) 11/06/2020   HGBA1C 11.2 (H) 11/06/2020    Review of Glycemic Control  Diabetes history: DM2 Outpatient Diabetes medications: Lantus 25 + Novolog 0-10 units tid Current orders for Inpatient glycemic control: Lantus 25 + Novolog 0-15 units tid correction + hs 0-5 units  Inpatient Diabetes Program Recommendations:   Received consult. A1c 11.2 (average blood glucose 275 over the past 2-3 months) Agree with orders and will follow and speak with patient as appropriate.  Thank you, Nani Gasser. Fawne Hughley, RN, MSN, CDE  Diabetes Coordinator Inpatient Glycemic Control Team Team Pager [phone] (8am-5pm) 11/06/2020 1:12 PM

## 2020-11-06 NOTE — Evaluation (Signed)
Physical Therapy Evaluation Patient Details Name: Christy Simmons MRN: [id number] DOB: 05/06/1935 Today's Date: 11/06/2020   History of Present Illness  86 y/o female presented to ED on 4/8 with AMS, sudden emesis, and weakness. MRI negative for stroke. Neurology believes toxic/metabolic encephalopathy in setting of elevated blood glucose. PMH: dementia, GERD, HTN, HLD, R MCA stroke 2016, type 2 DM  Clinical Impression  Per chart review, patient lives with daughter and uses w/c primarily for mobility PTA. Patient presents with generalized weakness, decreased activity tolerance, impaired balance, and impaired functional mobility. Patient requires maxA+2 for bed mobility and modA+2 for sit to stand transfer with HHAx2. Patient performed stand pivot transfer with maxA+2 and HHAx2 but demos difficulty stepping however able to initiate movement. Patient with hx of dementia at baseline. Patient will benefit from skilled PT services during acute stay to address listed deficits. Recommend SNF following discharge unless family is able to provide 24 hour assistance at discharge.     Follow Up Recommendations SNF;Supervision/Assistance - 24 hour    Equipment Recommendations  Other (comment) (TBD)    Recommendations for Other Services       Precautions / Restrictions Precautions Precautions: Fall Restrictions Weight Bearing Restrictions: No      Mobility  Bed Mobility Overal bed mobility: Needs Assistance Bed Mobility: Supine to Sit;Sit to Supine     Supine to sit: Max assist;+2 for physical assistance;+2 for safety/equipment Sit to supine: Max assist;+2 for physical assistance;+2 for safety/equipment   General bed mobility comments: maxA+2 for all aspects of bed mobility with patient initiating moving LEs towards EOB. Upon sitting required modA initially progressing to min guard    Transfers Overall transfer level: Needs assistance Equipment used: 2 person hand held assist Transfers: Sit  to/from [facility] Sit to Stand: Mod assist;+2 physical assistance;+2 safety/equipment Stand pivot transfers: Max assist;+2 physical assistance;+2 safety/equipment       General transfer comment: Difficulty stepping but initiates movement. MaxA+2 to complete stand pivot bed<>BSC. Multimodal cueing to assist with stepping  Ambulation/Gait             General Gait Details: deferred  Stairs            Wheelchair Mobility    Modified Rankin (Stroke Patients Only)       Balance Overall balance assessment: Needs assistance Sitting-balance support: Bilateral upper extremity supported;Feet supported Sitting balance-Leahy Scale: Fair Sitting balance - Comments: initially modA progressing to min gaurd   Standing balance support: Bilateral upper extremity supported;During functional activity Standing balance-Leahy Scale: Zero Standing balance comment: maxA to maintain standing with B UE support                             Pertinent Vitals/Pain Pain Assessment: Faces Faces Pain Scale: No hurt Pain Intervention(s): Monitored during session    Home Living Family/patient expects to be discharged to:: Private residence Living Arrangements: Children                    Prior Function Level of Independence: Needs assistance   Gait / Transfers Assistance Needed: w/c for mobility  ADL's / Homemaking Assistance Needed: Assist for all ADL other than feeding herself        Hand Dominance        Extremity/Trunk Assessment   Upper Extremity Assessment Upper Extremity Assessment: Defer to OT evaluation    Lower Extremity Assessment Lower Extremity Assessment: Generalized weakness  Communication   Communication: No difficulties  Cognition Arousal/Alertness: Lethargic Behavior During Therapy: WFL for tasks assessed/performed Overall Cognitive Status: Impaired/Different from baseline Area of Impairment:  Orientation;Attention;Memory;Following commands;Awareness;Problem solving                 Orientation Level: Disoriented to;Place;Situation Current Attention Level: Sustained Memory: Decreased short-term memory Following Commands: Follows one step commands inconsistently;Follows one step commands with increased time   Awareness: Intellectual Problem Solving: Difficulty sequencing;Slow processing;Requires verbal cues;Requires tactile cues General Comments: Attends to task with cues using name. Follows simple commands with increased time and demos slow processing and difficulty sequencing. No family member present to determine baseline      General Comments      Exercises     Assessment/Plan    PT Assessment Patient needs continued PT services  PT Problem List Decreased strength;Decreased activity tolerance;Decreased range of motion;Decreased balance;Decreased mobility;Decreased cognition       PT Treatment Interventions DME instruction;Gait training;Functional mobility training;Therapeutic activities;Therapeutic exercise;Neuromuscular re-education;Balance training;Patient/family education    PT Goals (Current goals can be found in the Care Plan section)  Acute Rehab PT Goals Patient Stated Goal: did not state PT Goal Formulation: Patient unable to participate in goal setting Time For Goal Achievement: 11/20/20 Potential to Achieve Goals: Fair    Frequency Min 2X/week   Barriers to discharge        Co-evaluation PT/OT/SLP Co-Evaluation/Treatment: Yes Reason for Co-Treatment: Necessary to address cognition/behavior during functional activity;For patient/therapist safety;To address functional/ADL transfers PT goals addressed during session: Mobility/safety with mobility;Balance         AM-PAC PT "6 Clicks" Mobility  Outcome Measure Help needed turning from your back to your side while in a flat bed without using bedrails?: A Lot Help needed moving from lying on  your back to sitting on the side of a flat bed without using bedrails?: A Lot Help needed moving to and from a bed to a chair (including a wheelchair)?: A Lot Help needed standing up from a chair using your arms (e.g., wheelchair or bedside chair)?: A Lot Help needed to walk in hospital room?: Total Help needed climbing 3-5 steps with a railing? : Total 6 Click Score: 10    End of Session Equipment Utilized During Treatment: Gait belt Activity Tolerance: Patient tolerated treatment well Patient left: in bed;with call bell/phone within reach;with bed alarm set Nurse Communication: Mobility status PT Visit Diagnosis: Unsteadiness on feet (R26.81);Muscle weakness (generalized) (M62.81);Difficulty in walking, not elsewhere classified (R26.2)    Time: 9323-5573 PT Time Calculation (min) (ACUTE ONLY): 31 min   Charges:   PT Evaluation $PT Eval Moderate Complexity: 1 Mod PT Treatments $Therapeutic Activity: 8-22 mins        Deuntae Kocsis A. Gilford Rile PT, DPT Acute Rehabilitation Services Pager [phone] Office [phone]   Linna Hoff 11/06/2020, 1:11 PM

## 2020-11-06 NOTE — Progress Notes (Signed)
PROGRESS NOTE    TERI LEGACY  YQM:[id number] DOB: November 09, 1934 DOA: 11/05/2020 PCP: Cyndi Bender, PA-C    Chief Complaint  Patient presents with  . Code Stroke    Brief Narrative:  86 year old lady with prior history of right MCA stroke status post intra-arterial intervention, dementia, DVT, atrial septal aneurysm, hypertension hyperlipidemia diabetes mellitus presents to the hospital with altered mental status and confusion.  Patient at baseline is wheelchair-bound and dependent on it for activities of daily living.  On arrival to ED she was found to be in hypertensive crisis.  Neurology consulted, recommended palliative care consult. MRI of the brain without contrast is negative for stroke.  Her urine analysis showed trace leukocytes and many bacteria, pyuria.  She was started on IV Rocephin and urine culture sent.  CT angiogram of the head and neck showed Moderate stenosis of the distal left PCA P2 segment.  Alcohol level is negative.  UDS is negative.  TSH within normal limits. Palliative care consulted for goals of care.    Assessment & Plan:   Principal Problem:   AMS (altered mental status) Active Problems:   Diabetes ([place])   DVT of lower extremity, bilateral ([place])   Hypertensive urgency   AKI (acute kidney injury) ([place])   Pressure injury of skin   Acute metabolic encephalopathy probably secondary to UTI vs polypharmacy.  UDS is negative TSH is within normal limits COVID-19 is negative Ordered sedating medications.  Analysis shows pyuria, trace leukocytes. She was started on IV rocephin for UTI.   MRI is negative for MRI.  Urine culture pending.     DVT: -continue with Eliquis.    Hypertensive Crisis: Not well controlled.  Added Norvasc and hydralazine.    Insulin dependent DM; Uncontrolled with hyperglycemia.  CBG (last 3)  Recent Labs    11/05/20 2011 11/06/20 0304 11/06/20 0606  GLUCAP 422* 118* 202*   continue WITH SSI. Continue with lantus 25  units daily.  Hemoglobin A1c is 11.2.    H/o prior stroke , with MCA involvement.  Continue with eliquis.    Hypokalemia:  Replaced.   Nausea and vomiting Appears to have resolved.    Mild AKI Probably prerenal azotemia from dehydration continue with IV fluids.   : Deconditioning Therapy evaluations ordered.   QT prolongation probably secondary to electrolyte abnormalities. Replace potassium keep it greater than 4 and magnesium greater than 2. Repeat EKG.  DVT prophylaxis: eliquis. Code Status: (Full code.  Family Communication: none at bedside.  Disposition:   Status is: Observation  The patient will require care spanning > 2 midnights and should be moved to inpatient because: Ongoing diagnostic testing needed not appropriate for outpatient work up and IV treatments appropriate due to intensity of illness or inability to take PO  Dispo: The patient is from: Home              Anticipated d/c is to: pending              Patient currently is not medically stable to d/c.   Difficult to place patient No       Consultants:   neurology   Procedures: MRI brain is negative.    Antimicrobials: rocephin for UTI   Subjective: No chest pain or sob, no nausea or vomiting.   Objective: Vitals:   11/06/20 0153 11/06/20 0224 11/06/20 0339 11/06/20 0850  BP: (!) 148/60 (!) 155/90 (!) 156/76 (!) 184/84  Pulse: 65 95 67 63  Resp: (!) 21 20  19 18  Temp:  97.6 F (36.4 C) 97.6 F (36.4 C) 97.9 F (36.6 C)  TempSrc:  Axillary Oral Oral  SpO2: 99% 95% 97% 99%  Weight:      Height:       No intake or output data in the 24 hours ending 11/06/20 0929 Filed Weights   11/05/20 2039  Weight: 55 kg    Examination:  General exam: Appears calm and comfortable  Respiratory system: Clear to auscultation. Respiratory effort normal. Cardiovascular system: S1 & S2 heard, RRR. No JVD, murmurs,  No pedal edema. Gastrointestinal system: Abdomen is nondistended, soft and  nontender. Normal bowel sounds heard. Central nervous system: Alert and oriented to person only. Able to answer simple questions about herself and daughter.  Extremities: Symmetric 5 x 5 power. Skin: No rashes, lesions or ulcers Psychiatry: . Mood & affect appropriate.     Data Reviewed: I have personally reviewed following labs and imaging studies  CBC: Recent Labs  Lab 11/05/20 2013 11/05/20 2017 11/06/20 0240  WBC 14.0*  --  12.7*  NEUTROABS 10.0*  --   --   HGB 14.4 14.3 12.7  HCT 42.7 42.0 36.1  MCV 86.6  --  82.8  PLT 266  --  811    Basic Metabolic Panel: Recent Labs  Lab 11/05/20 2017 11/05/20 2053 11/06/20 0240  NA 130* 130* 133*  K 3.6 3.2* 2.9*  CL 97* 93* 97*  CO2  --  24 26  GLUCOSE 414* 348* 113*  BUN 33* 28* 25*  CREATININE 1.00 1.32* 1.06*  CALCIUM  --  9.8 9.7  MG  --   --  1.7    GFR: Estimated Creatinine Clearance: 34.3 mL/min (A) (by C-G formula based on SCr of 1.06 mg/dL (H)).  Liver Function Tests: Recent Labs  Lab 11/05/20 2053  AST 23  ALT 17  ALKPHOS 62  BILITOT 0.7  PROT 5.9*  ALBUMIN 3.0*    CBG: Recent Labs  Lab 11/05/20 2011 11/06/20 0304 11/06/20 0606  GLUCAP 422* 118* 202*     Recent Results (from the past 240 hour(s))  Resp Panel by RT-PCR (Flu A&B, Covid) Nasopharyngeal Swab     Status: None   Collection Time: 11/05/20 11:40 PM   Specimen: Nasopharyngeal Swab; Nasopharyngeal(NP) swabs in vial transport medium  Result Value Ref Range Status   SARS Coronavirus 2 by RT PCR NEGATIVE NEGATIVE Final    Comment: (NOTE) SARS-CoV-2 target nucleic acids are NOT DETECTED.  The SARS-CoV-2 RNA is generally detectable in upper respiratory specimens during the acute phase of infection. The lowest concentration of SARS-CoV-2 viral copies this assay can detect is 138 copies/mL. A negative result does not preclude SARS-Cov-2 infection and should not be used as the sole basis for treatment or other patient management  decisions. A negative result may occur with  improper specimen collection/handling, submission of specimen other than nasopharyngeal swab, presence of viral mutation(s) within the areas targeted by this assay, and inadequate number of viral copies(<138 copies/mL). A negative result must be combined with clinical observations, patient history, and epidemiological information. The expected result is Negative.  Fact Sheet for Patients:  EntrepreneurPulse.com.au  Fact Sheet for Healthcare Providers:  IncredibleEmployment.be  This test is no t yet approved or cleared by the [place] FDA and  has been authorized for detection and/or diagnosis of SARS-CoV-2 by FDA under an Emergency Use Authorization (EUA). This EUA will remain  in effect (meaning this test can be used) for the  duration of the COVID-19 declaration under Section 564(b)(1) of the Act, 21 U.S.C.section 360bbb-3(b)(1), unless the authorization is terminated  or revoked sooner.       Influenza A by PCR NEGATIVE NEGATIVE Final   Influenza B by PCR NEGATIVE NEGATIVE Final    Comment: (NOTE) The Xpert Xpress SARS-CoV-2/FLU/RSV plus assay is intended as an aid in the diagnosis of influenza from Nasopharyngeal swab specimens and should not be used as a sole basis for treatment. Nasal washings and aspirates are unacceptable for Xpert Xpress SARS-CoV-2/FLU/RSV testing.  Fact Sheet for Patients: EntrepreneurPulse.com.au  Fact Sheet for Healthcare Providers: IncredibleEmployment.be  This test is not yet approved or cleared by the [place] FDA and has been authorized for detection and/or diagnosis of SARS-CoV-2 by FDA under an Emergency Use Authorization (EUA). This EUA will remain in effect (meaning this test can be used) for the duration of the COVID-19 declaration under Section 564(b)(1) of the Act, 21 U.S.C. section 360bbb-3(b)(1), unless the  authorization is terminated or revoked.  Performed at [facility], [place] [address] [place], [place] [address]          Radiology Studies: CT ANGIO HEAD W OR WO CONTRAST  Result Date: 11/05/2020 CLINICAL DATA:  Code stroke. Encephalopathy. Left-sided facial droop. EXAM: CT ANGIOGRAPHY HEAD AND NECK TECHNIQUE: Multidetector CT imaging of the head and neck was performed using the standard protocol during bolus administration of intravenous contrast. Multiplanar CT image reconstructions and MIPs were obtained to evaluate the vascular anatomy. Carotid stenosis measurements (when applicable) are obtained utilizing NASCET criteria, using the distal internal carotid diameter as the denominator. CONTRAST:  76mL OMNIPAQUE IOHEXOL 350 MG/ML SOLN COMPARISON:  Head CT 11/05/2020 FINDINGS: CTA NECK FINDINGS SKELETON: There is no bony spinal canal stenosis. No lytic or blastic lesion. OTHER NECK: Normal pharynx, larynx and major salivary glands. No cervical lymphadenopathy. UPPER CHEST: No pneumothorax or pleural effusion. No nodules or masses. AORTIC ARCH: There is calcific atherosclerosis of the aortic arch. There is no aneurysm, dissection or hemodynamically significant stenosis of the visualized portion of the aorta. Conventional 3 vessel aortic branching pattern. The visualized proximal subclavian arteries are widely patent. RIGHT CAROTID SYSTEM: No dissection, occlusion or aneurysm. There is mixed density atherosclerosis extending into the proximal ICA, resulting in less than 50% stenosis. LEFT CAROTID SYSTEM: No dissection, occlusion or aneurysm. There is mixed density atherosclerosis extending into the proximal ICA, resulting in less than 50% stenosis. VERTEBRAL ARTERIES: Left dominant configuration. Both origins are clearly patent. Mild stenosis of the right V3 segments and distal left V2 segment. CTA HEAD FINDINGS POSTERIOR CIRCULATION: --Vertebral arteries: Normal V4 segments. --Inferior cerebellar  arteries: Normal. --Basilar artery: Normal. --Superior cerebellar arteries: Normal. --Posterior cerebral arteries (PCA): Moderate stenosis of the distal left P2 segment. Normal right. There are small bilateral P comms. ANTERIOR CIRCULATION: --Intracranial internal carotid arteries: Normal. --Anterior cerebral arteries (ACA): Normal. Both A1 segments are present. Patent anterior communicating artery (a-comm). --Middle cerebral arteries (MCA): Normal. VENOUS SINUSES: As permitted by contrast timing, patent. ANATOMIC VARIANTS: None Review of the MIP images confirms the above findings. IMPRESSION: 1. No emergent large vessel occlusion. 2. Moderate stenosis of the distal left PCA P2 segment. 3. Mild stenosis of the right vertebral artery V3 segment and distal left V2 segment. 4. Bilateral carotid bifurcation atherosclerosis without hemodynamically significant stenosis by NASCET criteria. Aortic Atherosclerosis (ICD10-I70.0). Electronically Signed   By: Ulyses Jarred M.D.   On: 11/05/2020 20:43   DG Chest 2 View  Result Date:  11/06/2020 CLINICAL DATA:  Vomiting EXAM: CHEST - 2 VIEW COMPARISON:  08/09/2018 FINDINGS: No focal opacity or pleural effusion. Stable cardiomediastinal silhouette with aortic atherosclerosis. No pneumothorax. Old left-sided rib fractures IMPRESSION: No active cardiopulmonary disease. Electronically Signed   By: Donavan Foil M.D.   On: 11/06/2020 01:46   CT ANGIO NECK W OR WO CONTRAST  Result Date: 11/05/2020 CLINICAL DATA:  Code stroke. Encephalopathy. Left-sided facial droop. EXAM: CT ANGIOGRAPHY HEAD AND NECK TECHNIQUE: Multidetector CT imaging of the head and neck was performed using the standard protocol during bolus administration of intravenous contrast. Multiplanar CT image reconstructions and MIPs were obtained to evaluate the vascular anatomy. Carotid stenosis measurements (when applicable) are obtained utilizing NASCET criteria, using the distal internal carotid diameter as the  denominator. CONTRAST:  46mL OMNIPAQUE IOHEXOL 350 MG/ML SOLN COMPARISON:  Head CT 11/05/2020 FINDINGS: CTA NECK FINDINGS SKELETON: There is no bony spinal canal stenosis. No lytic or blastic lesion. OTHER NECK: Normal pharynx, larynx and major salivary glands. No cervical lymphadenopathy. UPPER CHEST: No pneumothorax or pleural effusion. No nodules or masses. AORTIC ARCH: There is calcific atherosclerosis of the aortic arch. There is no aneurysm, dissection or hemodynamically significant stenosis of the visualized portion of the aorta. Conventional 3 vessel aortic branching pattern. The visualized proximal subclavian arteries are widely patent. RIGHT CAROTID SYSTEM: No dissection, occlusion or aneurysm. There is mixed density atherosclerosis extending into the proximal ICA, resulting in less than 50% stenosis. LEFT CAROTID SYSTEM: No dissection, occlusion or aneurysm. There is mixed density atherosclerosis extending into the proximal ICA, resulting in less than 50% stenosis. VERTEBRAL ARTERIES: Left dominant configuration. Both origins are clearly patent. Mild stenosis of the right V3 segments and distal left V2 segment. CTA HEAD FINDINGS POSTERIOR CIRCULATION: --Vertebral arteries: Normal V4 segments. --Inferior cerebellar arteries: Normal. --Basilar artery: Normal. --Superior cerebellar arteries: Normal. --Posterior cerebral arteries (PCA): Moderate stenosis of the distal left P2 segment. Normal right. There are small bilateral P comms. ANTERIOR CIRCULATION: --Intracranial internal carotid arteries: Normal. --Anterior cerebral arteries (ACA): Normal. Both A1 segments are present. Patent anterior communicating artery (a-comm). --Middle cerebral arteries (MCA): Normal. VENOUS SINUSES: As permitted by contrast timing, patent. ANATOMIC VARIANTS: None Review of the MIP images confirms the above findings. IMPRESSION: 1. No emergent large vessel occlusion. 2. Moderate stenosis of the distal left PCA P2 segment. 3. Mild  stenosis of the right vertebral artery V3 segment and distal left V2 segment. 4. Bilateral carotid bifurcation atherosclerosis without hemodynamically significant stenosis by NASCET criteria. Aortic Atherosclerosis (ICD10-I70.0). Electronically Signed   By: Ulyses Jarred M.D.   On: 11/05/2020 20:43   MR BRAIN WO CONTRAST  Result Date: 11/05/2020 CLINICAL DATA:  Initial evaluation for neuro deficit, stroke suspected. EXAM: MRI HEAD WITHOUT CONTRAST TECHNIQUE: Multiplanar, multiecho pulse sequences of the brain and surrounding structures were obtained without intravenous contrast. COMPARISON:  Prior CTs from earlier the same day. FINDINGS: Brain: Examination moderately degraded by motion artifact. Diffuse prominence of the CSF containing spaces compatible with generalized cerebral atrophy. Patchy and confluent T2/FLAIR hyperintensity within the periventricular and deep white matter both cerebral hemispheres most consistent with chronic small vessel ischemic disease, moderate in nature. Remote lacunar infarct present at the right basal ganglia/external capsule. Few additional scattered remote lacunar infarcts noted about the bilateral basal ganglia and thalami. Chronic posterior left MCA distribution infarct noted at the left parietal lobe. Multiple small chronic bilateral cerebellar infarcts. No abnormal foci of restricted suggest acute or subacute ischemia. Gray-white matter differentiation otherwise maintained. No  visible foci of susceptibility artifact to suggest acute or chronic intracranial hemorrhage on this motion degraded exam. No mass lesion, midline shift or mass effect. No hydrocephalus or extra-axial fluid collection. Pituitary gland suprasellar region within normal limits. Midline structures intact. Vascular: Major intracranial vascular flow voids are maintained. Skull and upper cervical spine: Craniocervical junction within normal limits. Bone marrow signal intensity normal. No scalp soft tissue  abnormality. Sinuses/Orbits: Patient status post bilateral ocular lens replacement. Globes and orbital soft tissues demonstrate no acute finding. Paranasal sinuses are clear. Small bilateral mastoid effusions, of doubtful significance. Inner ear structures grossly normal. Other: None. IMPRESSION: 1. No acute intracranial abnormality. 2. Chronic left MCA distribution infarct, with additional remote lacunar infarcts involving the bilateral basal ganglia and thalami, and cerebellum. 3. Underlying moderately advanced cerebral atrophy with chronic small vessel ischemic disease. Electronically Signed   By: Jeannine Boga M.D.   On: 11/05/2020 23:21   CT HEAD CODE STROKE WO CONTRAST  Result Date: 11/05/2020 CLINICAL DATA:  Code stroke.  Left-sided facial droop EXAM: CT HEAD WITHOUT CONTRAST TECHNIQUE: Contiguous axial images were obtained from the base of the skull through the vertex without intravenous contrast. COMPARISON:  10/19/2014 FINDINGS: Brain: There is no mass, hemorrhage or extra-axial collection. Diffuse severe atrophy. There is hypoattenuation of the periventricular white matter, most commonly indicating chronic ischemic microangiopathy. There are multiple old infarcts, including the left parietal lobe and right basal ganglia. Vascular: No abnormal hyperdensity of the major intracranial arteries or dural venous sinuses. No intracranial atherosclerosis. Skull: The visualized skull base, calvarium and extracranial soft tissues are normal. Sinuses/Orbits: No fluid levels or advanced mucosal thickening of the visualized paranasal sinuses. No mastoid or middle ear effusion. The orbits are normal. ASPECTS [facility] Stroke Program Early CT Score) - Ganglionic level infarction (caudate, lentiform nuclei, internal capsule, insula, M1-M3 cortex): 7 - Supraganglionic infarction (M4-M6 cortex): 3 Total score (0-10 with 10 being normal): 10 IMPRESSION: 1. No hemorrhage. 2. ASPECTS is 10. 3. Diffuse severe atrophy  and chronic ischemic microangiopathy. 4. Multiple old infarcts. These results were communicated to Dr. Lesleigh Noe at 8:25 pm on 11/05/2020 by text page via the [facility] messaging system. Electronically Signed   By: Ulyses Jarred M.D.   On: 11/05/2020 20:26        Scheduled Meds: . amLODipine  5 mg Oral Daily  . apixaban  2.5 mg Oral BID  . insulin aspart  0-15 Units Subcutaneous TID WC  . insulin aspart  0-5 Units Subcutaneous QHS  . insulin glargine  25 Units Subcutaneous QPM  . sertraline  100 mg Oral QHS   Continuous Infusions: . sodium chloride    . cefTRIAXone (ROCEPHIN)  IV 1 g (11/06/20 0534)     LOS: 0 days    Time spent: 32 minutes    Hosie Poisson, MD [facility]   To contact the attending provider between 7A-7P or the covering provider during after hours 7P-7A, please log into the web site [email or web address] and access using universal [place] password for that web site. If you do not have the password, please call the hospital operator.  11/06/2020, 9:29 AM
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
## 2020-11-06 NOTE — Plan of Care (Signed)
MRI reviewed. Negative for stroke. No new recs. Please call if we can be of any further assistance.  Please see detailed recs in the consult note from Dr. Curly [place]. -- Amie [place], MD Neurologist [facility] Pager: [phone]

## 2020-11-06 NOTE — Plan of Care (Signed)
  Problem: Education: Goal: Knowledge of General Education information will improve Description: Including pain rating scale, medication(s)/side effects and non-pharmacologic comfort measures Outcome: Progressing   Problem: Education: Goal: Knowledge of patient specific risk factors addressed and post discharge goals established will improve Outcome: Progressing   Problem: Education: Goal: Knowledge of disease or condition will improve Outcome: Progressing   Problem: Education: Goal: Knowledge of disease or condition will improve Outcome: Progressing Goal: Knowledge of secondary prevention will improve Outcome: Progressing Goal: Knowledge of patient specific risk factors addressed and post discharge goals established will improve Outcome: Progressing Goal: Individualized Educational Video(s) Outcome: Progressing

## 2020-11-06 NOTE — Evaluation (Addendum)
Occupational Therapy Evaluation Patient Details Name: Christy Simmons MRN: [id number] DOB: 02-Jul-1935 Today's Date: 11/06/2020    History of Present Illness 86 y/o female presented to ED on 4/8 with AMS, sudden emesis, and weakness. MRI negative for stroke. Neurology believes toxic/metabolic encephalopathy in setting of elevated blood glucose. PMH: dementia, GERD, HTN, HLD, R MCA stroke 2016, type 2 DM   Clinical Impression   Pt PTA: Pt from home. Pt was totalA for ADL except for self feeding per chart. Pt currently, Pt limited by decreased strength, decreased ability to feed self and decreased mobility.  Pt modA +2 to maxA+2 for bed mobility, transfers and limited stand pivot. Pt modA for self feeding and maxA to totalA overall. Pt is 98% O2 on RA, 65 BPM. Pt would benefit from continued OT skilled services. OT following acutely. *IF family is able to provide 24/7 supervision and physical assist, HHOT can be more appropriate.     Follow Up Recommendations  SNF    Equipment Recommendations  None recommended by OT;3 in 1 bedside commode    Recommendations for Other Services       Precautions / Restrictions Precautions Precautions: Fall Restrictions Weight Bearing Restrictions: No      Mobility Bed Mobility Overal bed mobility: Needs Assistance Bed Mobility: Supine to Sit;Sit to Supine     Supine to sit: Max assist;+2 for physical assistance;+2 for safety/equipment Sit to supine: Max assist;+2 for physical assistance;+2 for safety/equipment   General bed mobility comments: MaxA+2 for trunk elevation and BLE mangement. Upon sitting required modA initially progressing to min guard    Transfers Overall transfer level: Needs assistance Equipment used: 2 person hand held assist Transfers: Sit to/from [facility] Sit to Stand: Mod assist;+2 physical assistance;+2 safety/equipment Stand pivot transfers: Max assist;+2 physical assistance;+2 safety/equipment        General transfer comment: Small steps taken. MaxA+2 to complete stand pivot bed<>BSC. Multimodal cueing to assist with stepping    Balance Overall balance assessment: Needs assistance Sitting-balance support: Bilateral upper extremity supported;Feet supported Sitting balance-Leahy Scale: Fair Sitting balance - Comments: initially modA progressing to min gaurd   Standing balance support: Bilateral upper extremity supported;During functional activity Standing balance-Leahy Scale: Zero Standing balance comment: maxA to maintain standing with B UE support                           ADL either performed or assessed with clinical judgement   ADL Overall ADL's : Needs assistance/impaired Eating/Feeding: Moderate assistance;Bed level Eating/Feeding Details (indicate cue type and reason): set-upA and then too weak to feed self Grooming: Total assistance   Upper Body Bathing: Total assistance   Lower Body Bathing: Total assistance   Upper Body Dressing : Total assistance   Lower Body Dressing: Total assistance   Toilet Transfer: Total assistance   Toileting- Clothing Manipulation and Hygiene: Total assistance;+2 for physical assistance;+2 for safety/equipment       Functional mobility during ADLs: Maximal assistance;+2 for physical assistance;+2 for safety/equipment;Cueing for sequencing;Cueing for safety General ADL Comments: Pt limited by decreased strength, decreased ability to feed self and decreased mobility.     Vision Baseline Vision/History: No visual deficits Vision Assessment?: Vision impaired- to be further tested in functional context Additional Comments: unable to assess as pt kept eye closed for most of session     Perception     Praxis      Pertinent Vitals/Pain Pain Assessment: Faces Faces Pain Scale: No  hurt Pain Intervention(s): Monitored during session     Hand Dominance     Extremity/Trunk Assessment Upper Extremity Assessment Upper  Extremity Assessment: Defer to OT evaluation   Lower Extremity Assessment Lower Extremity Assessment: Generalized weakness   Cervical / Trunk Assessment Cervical / Trunk Assessment: Kyphotic   Communication Communication Communication: No difficulties   Cognition Arousal/Alertness: Lethargic Behavior During Therapy: WFL for tasks assessed/performed Overall Cognitive Status: Impaired/Different from baseline Area of Impairment: Orientation;Attention;Memory;Following commands;Awareness;Problem solving                 Orientation Level: Disoriented to;Place;Situation Current Attention Level: Sustained Memory: Decreased short-term memory Following Commands: Follows one step commands inconsistently;Follows one step commands with increased time   Awareness: Intellectual Problem Solving: Difficulty sequencing;Slow processing;Requires verbal cues;Requires tactile cues General Comments: Pt able to answer 1 step commands, but unable to continually answer questions as pt would return to sleeping or nont answer. No family member present to determine baseline   General Comments  98% O2 on RA, 65 BPM    Exercises     Shoulder Instructions      Home Living Family/patient expects to be discharged to:: Private residence Living Arrangements: Children                               Additional Comments: difficult to gather info as pt is a poor historian      Prior Functioning/Environment Level of Independence: Needs assistance  Gait / Transfers Assistance Needed: w/c for mobility ADL's / Homemaking Assistance Needed: Assist for all ADL other than feeding herself            OT Problem List: Decreased strength;Decreased activity tolerance;Impaired balance (sitting and/or standing);Decreased cognition;Decreased safety awareness;Pain;Increased edema      OT Treatment/Interventions: Self-care/ADL training;Therapeutic exercise;Neuromuscular education;Energy  conservation;DME and/or AE instruction;Therapeutic activities;Patient/family education;Balance training;Visual/perceptual remediation/compensation;Cognitive remediation/compensation    OT Goals(Current goals can be found in the care plan section) Acute Rehab OT Goals Patient Stated Goal: did not state OT Goal Formulation: Patient unable to participate in goal setting Time For Goal Achievement: 11/20/20 Potential to Achieve Goals: Good ADL Goals Pt Will Perform Eating: with set-up;with adaptive utensils;bed level Pt/caregiver will Perform Home Exercise Program: Increased strength;Both right and left upper extremity;With minimal assist Additional ADL Goal #1: Pt will perform bed mobility with modA overall as precursor for functional mobility OOB. Additional ADL Goal #2: Pt will sit EOB x3 mins with minA overall for stability.  OT Frequency: Min 2X/week   Barriers to D/C: Decreased caregiver support          Co-evaluation PT/OT/SLP Co-Evaluation/Treatment: Yes Reason for Co-Treatment: To address functional/ADL transfers;For patient/therapist safety   OT goals addressed during session: ADL's and self-care      AM-PAC OT "6 Clicks" Daily Activity     Outcome Measure Help from another person eating meals?: Total Help from another person taking care of personal grooming?: Total Help from another person toileting, which includes using toliet, bedpan, or urinal?: Total Help from another person bathing (including washing, rinsing, drying)?: Total Help from another person to put on and taking off regular upper body clothing?: Total Help from another person to put on and taking off regular lower body clothing?: Total 6 Click Score: 6   End of Session Equipment Utilized During Treatment: Gait belt Nurse Communication: Mobility status  Activity Tolerance: Patient limited by pain;Patient limited by fatigue Patient left: in bed;with call bell/phone within reach;with  bed alarm set  OT Visit  Diagnosis: Unsteadiness on feet (R26.81);Muscle weakness (generalized) (M62.81);Other symptoms and signs involving cognitive function                Time: 3606-7703 OT Time Calculation (min): 35 min Charges:  OT General Charges $OT Visit: 1 Visit OT Evaluation $OT Eval Moderate Complexity: 1 Mod  Jefferey Pica, OTR/L Acute Rehabilitation Services Pager: [phone] Office: [place] [facility] 11/06/2020, 8:16 PM

## 2020-11-07 DIAGNOSIS — Z20822 Contact with and (suspected) exposure to covid-19: Secondary | ICD-10-CM | POA: Diagnosis present

## 2020-11-07 DIAGNOSIS — Z888 Allergy status to other drugs, medicaments and biological substances status: Secondary | ICD-10-CM | POA: Diagnosis not present

## 2020-11-07 DIAGNOSIS — K219 Gastro-esophageal reflux disease without esophagitis: Secondary | ICD-10-CM | POA: Diagnosis present

## 2020-11-07 DIAGNOSIS — I253 Aneurysm of heart: Secondary | ICD-10-CM | POA: Diagnosis present

## 2020-11-07 DIAGNOSIS — Z6820 Body mass index (BMI) 20.0-20.9, adult: Secondary | ICD-10-CM | POA: Diagnosis not present

## 2020-11-07 DIAGNOSIS — N390 Urinary tract infection, site not specified: Secondary | ICD-10-CM | POA: Diagnosis present

## 2020-11-07 DIAGNOSIS — Z993 Dependence on wheelchair: Secondary | ICD-10-CM | POA: Diagnosis not present

## 2020-11-07 DIAGNOSIS — F0390 Unspecified dementia without behavioral disturbance: Secondary | ICD-10-CM | POA: Diagnosis present

## 2020-11-07 DIAGNOSIS — I69354 Hemiplegia and hemiparesis following cerebral infarction affecting left non-dominant side: Secondary | ICD-10-CM | POA: Diagnosis not present

## 2020-11-07 DIAGNOSIS — Z885 Allergy status to narcotic agent status: Secondary | ICD-10-CM | POA: Diagnosis not present

## 2020-11-07 DIAGNOSIS — E440 Moderate protein-calorie malnutrition: Secondary | ICD-10-CM | POA: Diagnosis not present

## 2020-11-07 DIAGNOSIS — G9341 Metabolic encephalopathy: Secondary | ICD-10-CM | POA: Diagnosis present

## 2020-11-07 DIAGNOSIS — M1990 Unspecified osteoarthritis, unspecified site: Secondary | ICD-10-CM | POA: Diagnosis present

## 2020-11-07 DIAGNOSIS — I82503 Chronic embolism and thrombosis of unspecified deep veins of lower extremity, bilateral: Secondary | ICD-10-CM | POA: Diagnosis present

## 2020-11-07 DIAGNOSIS — A0472 Enterocolitis due to Clostridium difficile, not specified as recurrent: Secondary | ICD-10-CM | POA: Diagnosis present

## 2020-11-07 DIAGNOSIS — Q211 Atrial septal defect: Secondary | ICD-10-CM | POA: Diagnosis not present

## 2020-11-07 DIAGNOSIS — E871 Hypo-osmolality and hyponatremia: Secondary | ICD-10-CM | POA: Diagnosis present

## 2020-11-07 DIAGNOSIS — I824Z3 Acute embolism and thrombosis of unspecified deep veins of distal lower extremity, bilateral: Secondary | ICD-10-CM

## 2020-11-07 DIAGNOSIS — R4182 Altered mental status, unspecified: Secondary | ICD-10-CM | POA: Diagnosis present

## 2020-11-07 DIAGNOSIS — N1831 Chronic kidney disease, stage 3a: Secondary | ICD-10-CM | POA: Diagnosis present

## 2020-11-07 DIAGNOSIS — R531 Weakness: Secondary | ICD-10-CM | POA: Diagnosis not present

## 2020-11-07 DIAGNOSIS — N179 Acute kidney failure, unspecified: Secondary | ICD-10-CM | POA: Diagnosis not present

## 2020-11-07 DIAGNOSIS — Z7189 Other specified counseling: Secondary | ICD-10-CM | POA: Diagnosis not present

## 2020-11-07 DIAGNOSIS — R54 Age-related physical debility: Secondary | ICD-10-CM | POA: Diagnosis present

## 2020-11-07 DIAGNOSIS — I129 Hypertensive chronic kidney disease with stage 1 through stage 4 chronic kidney disease, or unspecified chronic kidney disease: Secondary | ICD-10-CM | POA: Diagnosis present

## 2020-11-07 DIAGNOSIS — E876 Hypokalemia: Secondary | ICD-10-CM | POA: Diagnosis present

## 2020-11-07 DIAGNOSIS — E1122 Type 2 diabetes mellitus with diabetic chronic kidney disease: Secondary | ICD-10-CM | POA: Diagnosis present

## 2020-11-07 DIAGNOSIS — Z515 Encounter for palliative care: Secondary | ICD-10-CM | POA: Diagnosis not present

## 2020-11-07 DIAGNOSIS — E785 Hyperlipidemia, unspecified: Secondary | ICD-10-CM | POA: Diagnosis present

## 2020-11-07 DIAGNOSIS — E860 Dehydration: Secondary | ICD-10-CM | POA: Diagnosis present

## 2020-11-07 DIAGNOSIS — Z66 Do not resuscitate: Secondary | ICD-10-CM | POA: Diagnosis present

## 2020-11-07 LAB — CBC WITH DIFFERENTIAL/PLATELET
Abs Immature Granulocytes: 0.04 10*3/uL (ref 0.00–0.07)
Basophils Absolute: 0 10*3/uL (ref 0.0–0.1)
Basophils Relative: 0 %
Eosinophils Absolute: 0.1 10*3/uL (ref 0.0–0.5)
Eosinophils Relative: 1 %
HCT: 31 % — ABNORMAL LOW (ref 36.0–46.0)
Hemoglobin: 10.8 g/dL — ABNORMAL LOW (ref 12.0–15.0)
Immature Granulocytes: 0 %
Lymphocytes Relative: 22 %
Lymphs Abs: 2.5 10*3/uL (ref 0.7–4.0)
MCH: 29.4 pg (ref 26.0–34.0)
MCHC: 34.8 g/dL (ref 30.0–36.0)
MCV: 84.5 fL (ref 80.0–100.0)
Monocytes Absolute: 1.1 10*3/uL — ABNORMAL HIGH (ref 0.1–1.0)
Monocytes Relative: 10 %
Neutro Abs: 7.2 10*3/uL (ref 1.7–7.7)
Neutrophils Relative %: 67 %
Platelets: 240 10*3/uL (ref 150–400)
RBC: 3.67 MIL/uL — ABNORMAL LOW (ref 3.87–5.11)
RDW: 13.3 % (ref 11.5–15.5)
WBC: 11 10*3/uL — ABNORMAL HIGH (ref 4.0–10.5)
nRBC: 0 % (ref 0.0–0.2)

## 2020-11-07 LAB — MAGNESIUM: Magnesium: 1.7 mg/dL (ref 1.7–2.4)

## 2020-11-07 LAB — C DIFFICILE QUICK SCREEN W PCR REFLEX
C Diff antigen: POSITIVE — AB
C Diff toxin: NEGATIVE

## 2020-11-07 LAB — GLUCOSE, CAPILLARY
Glucose-Capillary: 161 mg/dL — ABNORMAL HIGH (ref 70–99)
Glucose-Capillary: 42 mg/dL — CL (ref 70–99)
Glucose-Capillary: 280 mg/dL — ABNORMAL HIGH (ref 70–99)
Glucose-Capillary: 164 mg/dL — ABNORMAL HIGH (ref 70–99)
Glucose-Capillary: 156 mg/dL — ABNORMAL HIGH (ref 70–99)

## 2020-11-07 LAB — BASIC METABOLIC PANEL
Anion gap: 7 (ref 5–15)
BUN: 26 mg/dL — ABNORMAL HIGH (ref 8–23)
CO2: 22 mmol/L (ref 22–32)
Calcium: 9 mg/dL (ref 8.9–10.3)
Chloride: 107 mmol/L (ref 98–111)
Creatinine, Ser: 1.18 mg/dL — ABNORMAL HIGH (ref 0.44–1.00)
GFR, Estimated: 46 mL/min — ABNORMAL LOW (ref >60)
Glucose, Bld: 50 mg/dL — ABNORMAL LOW (ref 70–99)
Potassium: 3.2 mmol/L — ABNORMAL LOW (ref 3.5–5.1)
Sodium: 136 mmol/L (ref 135–145)

## 2020-11-07 LAB — CLOSTRIDIUM DIFFICILE BY PCR, REFLEXED: Toxigenic C. Difficile by PCR: POSITIVE — AB

## 2020-11-07 MED ORDER — HYDRALAZINE HCL 50 MG PO TABS
50.0000 mg | ORAL_TABLET | Freq: Three times a day (TID) | ORAL | Status: DC
  Administered 2020-11-07 – 2020-11-10 (×9): 50 mg via ORAL
  Filled 2020-11-07 (×9): qty 1

## 2020-11-07 MED ORDER — INSULIN GLARGINE 100 UNIT/ML ~~LOC~~ SOLN
20.0000 [IU] | Freq: Every day | SUBCUTANEOUS | Status: DC
  Administered 2020-11-08 – 2020-11-09 (×2): 20 [IU] via SUBCUTANEOUS
  Filled 2020-11-07 (×3): qty 0.2

## 2020-11-07 MED ORDER — POTASSIUM CHLORIDE CRYS ER 20 MEQ PO TBCR
40.0000 meq | EXTENDED_RELEASE_TABLET | Freq: Once | ORAL | Status: AC
  Administered 2020-11-07: 40 meq via ORAL
  Filled 2020-11-07: qty 2

## 2020-11-07 MED ORDER — DEXTROSE 50 % IV SOLN
INTRAVENOUS | Status: AC
  Administered 2020-11-07: 25 mL
  Filled 2020-11-07: qty 50
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
## 2020-11-07 NOTE — Progress Notes (Addendum)
PROGRESS NOTE    Christy Simmons  GBT:[id number] DOB: April 03, 1935 DOA: 11/05/2020 PCP: Cyndi Bender, PA-C    Chief Complaint  Patient presents with  . Code Stroke    Brief Narrative:  86 year old lady with prior history of right MCA stroke status post intra-arterial intervention, dementia, DVT, atrial septal aneurysm, hypertension hyperlipidemia diabetes mellitus presents to the hospital with altered mental status and confusion.  Patient at baseline is wheelchair-bound and dependent on it for activities of daily living.  On arrival to ED she was found to be in hypertensive crisis.  Neurology consulted, recommended palliative care consult. MRI of the brain without contrast is negative for stroke.  Her urine analysis showed trace leukocytes and many bacteria, pyuria.  She was started on IV Rocephin and urine culture sent.  CT angiogram of the head and neck showed Moderate stenosis of the distal left PCA P2 segment.  Alcohol level is negative.  UDS is negative.  TSH within normal limits. Palliative care consulted for goals of care. Pt seen and examined at bedside.  No new complaints. She appears comfortable.     Assessment & Plan:   Principal Problem:   AMS (altered mental status) Active Problems:   Diabetes ([place])   DVT of lower extremity, bilateral (HCC)   Hypertensive urgency   AKI (acute kidney injury) ([place])   Pressure injury of skin   Altered mental status, unspecified   Acute metabolic encephalopathy probably secondary to UTI vs polypharmacy.  UDS is negative TSH is within normal limits COVID-19 is negative Urne Analysis shows pyuria, trace leukocytes. She was started on IV rocephin for UTI. Urine cultures are pending. Avoid sedating medications.   MRI is negative for MRI.  Urine culture pending.     DVT: -continue with Eliquis.    Hypertensive Crisis: BP parameters appear to be optimal.  continue with norvasc and hydralazine.    Insulin dependent DM; Uncontrolled  with hyperglycemia and hypoglycemia. CBG (last 3)  Recent Labs    11/07/20 0641 11/07/20 0656 11/07/20 1207  GLUCAP 42* 161* 280*    Hemoglobin A1c is 11.2. hold lantus tonight.  Continue with SSI.    H/o prior stroke , with MCA involvement.  Continue with eliquis.    Hypokalemia:  Replaced. Repeat in the morning.  Check magnesium levels.   Nausea and vomiting Appears to have resolved.    Mild AKI Probably prerenal azotemia from dehydration continue with IV fluids.   Hyponatremia Resolved. Possibly from dehydration.     Deconditioning Therapy evaluations ordered.   QT prolongation probably secondary to electrolyte abnormalities. Replace potassium keep it greater than 4 and magnesium greater than 2. Repeat EKG.  Pressure Injury 11/06/20 Arm Distal;Left;Posterior;Upper Stage 1 -  Intact skin with non-blanchable redness of a localized area usually over a bony prominence. (Active)  11/06/20 0244  Location: Arm  Location Orientation: Distal;Left;Posterior;Upper  Staging: Stage 1 -  Intact skin with non-blanchable redness of a localized area usually over a bony prominence.  Wound Description (Comments):   Present on Admission: Yes     Pressure Injury 11/06/20 Tibial Left;Posterior Deep Tissue Pressure Injury - Purple or maroon localized area of discolored intact skin or blood-filled blister due to damage of underlying soft tissue from pressure and/or shear. scattered down calf (Active)  11/06/20 0246  Location: Tibial  Location Orientation: Left;Posterior  Staging: Deep Tissue Pressure Injury - Purple or maroon localized area of discolored intact skin or blood-filled blister due to damage of underlying soft tissue from  pressure and/or shear.  Wound Description (Comments): scattered down calf  Present on Admission: Yes     Pressure Injury 11/06/20 Tibial Posterior;Proximal;Right Deep Tissue Pressure Injury - Purple or maroon localized area of discolored intact skin  or blood-filled blister due to damage of underlying soft tissue from pressure and/or shear. scattered on calf (Active)  11/06/20 0247  Location: Tibial  Location Orientation: Posterior;Proximal;Right  Staging: Deep Tissue Pressure Injury - Purple or maroon localized area of discolored intact skin or blood-filled blister due to damage of underlying soft tissue from pressure and/or shear.  Wound Description (Comments): scattered on calf area  Present on Admission: Yes   Wound care will be consulted.     DVT prophylaxis: eliquis. Code Status: (Full code.  Family Communication: none at bedside.  Disposition:   Status is: Observation  The patient will require care spanning > 2 midnights and should be moved to inpatient because: Ongoing diagnostic testing needed not appropriate for outpatient work up and IV treatments appropriate due to intensity of illness or inability to take PO  Dispo: The patient is from: Home              Anticipated d/c is to: pending              Patient currently is not medically stable to d/c.   Difficult to place patient No       Consultants:   Neurology Palliative care   Procedures: MRI brain is negative.    Antimicrobials: rocephin for UTI   Subjective: No new complaints.   Objective: Vitals:   11/07/20 0051 11/07/20 0421 11/07/20 0854 11/07/20 1213  BP: 137/76 (!) 150/73 (!) 184/76 (!) 156/74  Pulse: 62 (!) 59 61 67  Resp: 19 19 18 18   Temp: 98.9 F (37.2 C) 97.8 F (36.6 C) 98.1 F (36.7 C) 98.3 F (36.8 C)  TempSrc:   Oral Oral  SpO2: 97% 97% 98% 97%  Weight:      Height:        Intake/Output Summary (Last 24 hours) at 11/07/2020 1509 Last data filed at 11/07/2020 0900 Gross per 24 hour  Intake 720 ml  Output 550 ml  Net 170 ml   Filed Weights   11/05/20 2039  Weight: 55 kg    Examination:  General exam: alert and comfortable.  Respiratory system: Clear to auscultation, no wheezing or rhonchi.  Cardiovascular system:  S1S2, RRR, no JVD, Gastrointestinal system: Abdomen is soft, NT ND BS+ Central nervous system: alert, able to follow simple commands.  Extremities:no pedal edema.  Skin: pressure injury over the tibia.  Psychiatry: cannot be assessed.     Data Reviewed: I have personally reviewed following labs and imaging studies  CBC: Recent Labs  Lab 11/05/20 2013 11/05/20 2017 11/06/20 0240 11/07/20 0246  WBC 14.0*  --  12.7* 11.0*  NEUTROABS 10.0*  --   --  7.2  HGB 14.4 14.3 12.7 10.8*  HCT 42.7 42.0 36.1 31.0*  MCV 86.6  --  82.8 84.5  PLT 266  --  275 502    Basic Metabolic Panel: Recent Labs  Lab 11/05/20 2017 11/05/20 2053 11/06/20 0240 11/06/20 1556 11/07/20 0246  NA 130* 130* 133*  --  136  K 3.6 3.2* 2.9* 3.3* 3.2*  CL 97* 93* 97*  --  107  CO2  --  24 26  --  22  GLUCOSE 414* 348* 113*  --  50*  BUN 33* 28* 25*  --  26*  CREATININE 1.00 1.32* 1.06*  --  1.18*  CALCIUM  --  9.8 9.7  --  9.0  MG  --   --  1.7  --   --     GFR: Estimated Creatinine Clearance: 30.8 mL/min (A) (by C-G formula based on SCr of 1.18 mg/dL (H)).  Liver Function Tests: Recent Labs  Lab 11/05/20 2053  AST 23  ALT 17  ALKPHOS 62  BILITOT 0.7  PROT 5.9*  ALBUMIN 3.0*    CBG: Recent Labs  Lab 11/06/20 1627 11/06/20 2130 11/07/20 0641 11/07/20 0656 11/07/20 1207  GLUCAP 193* 90 42* 161* 280*     Recent Results (from the past 240 hour(s))  Resp Panel by RT-PCR (Flu A&B, Covid) Nasopharyngeal Swab     Status: None   Collection Time: 11/05/20 11:40 PM   Specimen: Nasopharyngeal Swab; Nasopharyngeal(NP) swabs in vial transport medium  Result Value Ref Range Status   SARS Coronavirus 2 by RT PCR NEGATIVE NEGATIVE Final    Comment: (NOTE) SARS-CoV-2 target nucleic acids are NOT DETECTED.  The SARS-CoV-2 RNA is generally detectable in upper respiratory specimens during the acute phase of infection. The lowest concentration of SARS-CoV-2 viral copies this assay can detect  is 138 copies/mL. A negative result does not preclude SARS-Cov-2 infection and should not be used as the sole basis for treatment or other patient management decisions. A negative result may occur with  improper specimen collection/handling, submission of specimen other than nasopharyngeal swab, presence of viral mutation(s) within the areas targeted by this assay, and inadequate number of viral copies(<138 copies/mL). A negative result must be combined with clinical observations, patient history, and epidemiological information. The expected result is Negative.  Fact Sheet for Patients:  EntrepreneurPulse.com.au  Fact Sheet for Healthcare Providers:  IncredibleEmployment.be  This test is no t yet approved or cleared by the [place] FDA and  has been authorized for detection and/or diagnosis of SARS-CoV-2 by FDA under an Emergency Use Authorization (EUA). This EUA will remain  in effect (meaning this test can be used) for the duration of the COVID-19 declaration under Section 564(b)(1) of the Act, 21 U.S.C.section 360bbb-3(b)(1), unless the authorization is terminated  or revoked sooner.       Influenza A by PCR NEGATIVE NEGATIVE Final   Influenza B by PCR NEGATIVE NEGATIVE Final    Comment: (NOTE) The Xpert Xpress SARS-CoV-2/FLU/RSV plus assay is intended as an aid in the diagnosis of influenza from Nasopharyngeal swab specimens and should not be used as a sole basis for treatment. Nasal washings and aspirates are unacceptable for Xpert Xpress SARS-CoV-2/FLU/RSV testing.  Fact Sheet for Patients: EntrepreneurPulse.com.au  Fact Sheet for Healthcare Providers: IncredibleEmployment.be  This test is not yet approved or cleared by the [place] FDA and has been authorized for detection and/or diagnosis of SARS-CoV-2 by FDA under an Emergency Use Authorization (EUA). This EUA will remain in effect  (meaning this test can be used) for the duration of the COVID-19 declaration under Section 564(b)(1) of the Act, 21 U.S.C. section 360bbb-3(b)(1), unless the authorization is terminated or revoked.  Performed at [facility], [place] [address] [place], [place] [address]   Culture, Urine     Status: Abnormal (Preliminary result)   Collection Time: 11/06/20  4:04 AM   Specimen: Urine, Random  Result Value Ref Range Status   Specimen Description URINE, RANDOM  Final   Special Requests NONE  Final   Culture (A)  Final    >=100,000 COLONIES/mL  ESCHERICHIA COLI SUSCEPTIBILITIES TO FOLLOW Performed at [facility], [place] [address], [place], [place] [address]    Report Status PENDING  Incomplete         Radiology Studies: CT ANGIO HEAD W OR WO CONTRAST  Result Date: 11/05/2020 CLINICAL DATA:  Code stroke. Encephalopathy. Left-sided facial droop. EXAM: CT ANGIOGRAPHY HEAD AND NECK TECHNIQUE: Multidetector CT imaging of the head and neck was performed using the standard protocol during bolus administration of intravenous contrast. Multiplanar CT image reconstructions and MIPs were obtained to evaluate the vascular anatomy. Carotid stenosis measurements (when applicable) are obtained utilizing NASCET criteria, using the distal internal carotid diameter as the denominator. CONTRAST:  20mL OMNIPAQUE IOHEXOL 350 MG/ML SOLN COMPARISON:  Head CT 11/05/2020 FINDINGS: CTA NECK FINDINGS SKELETON: There is no bony spinal canal stenosis. No lytic or blastic lesion. OTHER NECK: Normal pharynx, larynx and major salivary glands. No cervical lymphadenopathy. UPPER CHEST: No pneumothorax or pleural effusion. No nodules or masses. AORTIC ARCH: There is calcific atherosclerosis of the aortic arch. There is no aneurysm, dissection or hemodynamically significant stenosis of the visualized portion of the aorta. Conventional 3 vessel aortic branching pattern. The visualized proximal subclavian arteries are  widely patent. RIGHT CAROTID SYSTEM: No dissection, occlusion or aneurysm. There is mixed density atherosclerosis extending into the proximal ICA, resulting in less than 50% stenosis. LEFT CAROTID SYSTEM: No dissection, occlusion or aneurysm. There is mixed density atherosclerosis extending into the proximal ICA, resulting in less than 50% stenosis. VERTEBRAL ARTERIES: Left dominant configuration. Both origins are clearly patent. Mild stenosis of the right V3 segments and distal left V2 segment. CTA HEAD FINDINGS POSTERIOR CIRCULATION: --Vertebral arteries: Normal V4 segments. --Inferior cerebellar arteries: Normal. --Basilar artery: Normal. --Superior cerebellar arteries: Normal. --Posterior cerebral arteries (PCA): Moderate stenosis of the distal left P2 segment. Normal right. There are small bilateral P comms. ANTERIOR CIRCULATION: --Intracranial internal carotid arteries: Normal. --Anterior cerebral arteries (ACA): Normal. Both A1 segments are present. Patent anterior communicating artery (a-comm). --Middle cerebral arteries (MCA): Normal. VENOUS SINUSES: As permitted by contrast timing, patent. ANATOMIC VARIANTS: None Review of the MIP images confirms the above findings. IMPRESSION: 1. No emergent large vessel occlusion. 2. Moderate stenosis of the distal left PCA P2 segment. 3. Mild stenosis of the right vertebral artery V3 segment and distal left V2 segment. 4. Bilateral carotid bifurcation atherosclerosis without hemodynamically significant stenosis by NASCET criteria. Aortic Atherosclerosis (ICD10-I70.0). Electronically Signed   By: Ulyses Jarred M.D.   On: 11/05/2020 20:43   DG Chest 2 View  Result Date: 11/06/2020 CLINICAL DATA:  Vomiting EXAM: CHEST - 2 VIEW COMPARISON:  08/09/2018 FINDINGS: No focal opacity or pleural effusion. Stable cardiomediastinal silhouette with aortic atherosclerosis. No pneumothorax. Old left-sided rib fractures IMPRESSION: No active cardiopulmonary disease. Electronically  Signed   By: Donavan Foil M.D.   On: 11/06/2020 01:46   CT ANGIO NECK W OR WO CONTRAST  Result Date: 11/05/2020 CLINICAL DATA:  Code stroke. Encephalopathy. Left-sided facial droop. EXAM: CT ANGIOGRAPHY HEAD AND NECK TECHNIQUE: Multidetector CT imaging of the head and neck was performed using the standard protocol during bolus administration of intravenous contrast. Multiplanar CT image reconstructions and MIPs were obtained to evaluate the vascular anatomy. Carotid stenosis measurements (when applicable) are obtained utilizing NASCET criteria, using the distal internal carotid diameter as the denominator. CONTRAST:  98mL OMNIPAQUE IOHEXOL 350 MG/ML SOLN COMPARISON:  Head CT 11/05/2020 FINDINGS: CTA NECK FINDINGS SKELETON: There is no bony spinal canal stenosis. No lytic or blastic lesion.  OTHER NECK: Normal pharynx, larynx and major salivary glands. No cervical lymphadenopathy. UPPER CHEST: No pneumothorax or pleural effusion. No nodules or masses. AORTIC ARCH: There is calcific atherosclerosis of the aortic arch. There is no aneurysm, dissection or hemodynamically significant stenosis of the visualized portion of the aorta. Conventional 3 vessel aortic branching pattern. The visualized proximal subclavian arteries are widely patent. RIGHT CAROTID SYSTEM: No dissection, occlusion or aneurysm. There is mixed density atherosclerosis extending into the proximal ICA, resulting in less than 50% stenosis. LEFT CAROTID SYSTEM: No dissection, occlusion or aneurysm. There is mixed density atherosclerosis extending into the proximal ICA, resulting in less than 50% stenosis. VERTEBRAL ARTERIES: Left dominant configuration. Both origins are clearly patent. Mild stenosis of the right V3 segments and distal left V2 segment. CTA HEAD FINDINGS POSTERIOR CIRCULATION: --Vertebral arteries: Normal V4 segments. --Inferior cerebellar arteries: Normal. --Basilar artery: Normal. --Superior cerebellar arteries: Normal. --Posterior  cerebral arteries (PCA): Moderate stenosis of the distal left P2 segment. Normal right. There are small bilateral P comms. ANTERIOR CIRCULATION: --Intracranial internal carotid arteries: Normal. --Anterior cerebral arteries (ACA): Normal. Both A1 segments are present. Patent anterior communicating artery (a-comm). --Middle cerebral arteries (MCA): Normal. VENOUS SINUSES: As permitted by contrast timing, patent. ANATOMIC VARIANTS: None Review of the MIP images confirms the above findings. IMPRESSION: 1. No emergent large vessel occlusion. 2. Moderate stenosis of the distal left PCA P2 segment. 3. Mild stenosis of the right vertebral artery V3 segment and distal left V2 segment. 4. Bilateral carotid bifurcation atherosclerosis without hemodynamically significant stenosis by NASCET criteria. Aortic Atherosclerosis (ICD10-I70.0). Electronically Signed   By: Ulyses Jarred M.D.   On: 11/05/2020 20:43   MR BRAIN WO CONTRAST  Result Date: 11/05/2020 CLINICAL DATA:  Initial evaluation for neuro deficit, stroke suspected. EXAM: MRI HEAD WITHOUT CONTRAST TECHNIQUE: Multiplanar, multiecho pulse sequences of the brain and surrounding structures were obtained without intravenous contrast. COMPARISON:  Prior CTs from earlier the same day. FINDINGS: Brain: Examination moderately degraded by motion artifact. Diffuse prominence of the CSF containing spaces compatible with generalized cerebral atrophy. Patchy and confluent T2/FLAIR hyperintensity within the periventricular and deep white matter both cerebral hemispheres most consistent with chronic small vessel ischemic disease, moderate in nature. Remote lacunar infarct present at the right basal ganglia/external capsule. Few additional scattered remote lacunar infarcts noted about the bilateral basal ganglia and thalami. Chronic posterior left MCA distribution infarct noted at the left parietal lobe. Multiple small chronic bilateral cerebellar infarcts. No abnormal foci of  restricted suggest acute or subacute ischemia. Gray-white matter differentiation otherwise maintained. No visible foci of susceptibility artifact to suggest acute or chronic intracranial hemorrhage on this motion degraded exam. No mass lesion, midline shift or mass effect. No hydrocephalus or extra-axial fluid collection. Pituitary gland suprasellar region within normal limits. Midline structures intact. Vascular: Major intracranial vascular flow voids are maintained. Skull and upper cervical spine: Craniocervical junction within normal limits. Bone marrow signal intensity normal. No scalp soft tissue abnormality. Sinuses/Orbits: Patient status post bilateral ocular lens replacement. Globes and orbital soft tissues demonstrate no acute finding. Paranasal sinuses are clear. Small bilateral mastoid effusions, of doubtful significance. Inner ear structures grossly normal. Other: None. IMPRESSION: 1. No acute intracranial abnormality. 2. Chronic left MCA distribution infarct, with additional remote lacunar infarcts involving the bilateral basal ganglia and thalami, and cerebellum. 3. Underlying moderately advanced cerebral atrophy with chronic small vessel ischemic disease. Electronically Signed   By: Jeannine Boga M.D.   On: 11/05/2020 23:21   CT HEAD CODE STROKE  WO CONTRAST  Result Date: 11/05/2020 CLINICAL DATA:  Code stroke.  Left-sided facial droop EXAM: CT HEAD WITHOUT CONTRAST TECHNIQUE: Contiguous axial images were obtained from the base of the skull through the vertex without intravenous contrast. COMPARISON:  10/19/2014 FINDINGS: Brain: There is no mass, hemorrhage or extra-axial collection. Diffuse severe atrophy. There is hypoattenuation of the periventricular white matter, most commonly indicating chronic ischemic microangiopathy. There are multiple old infarcts, including the left parietal lobe and right basal ganglia. Vascular: No abnormal hyperdensity of the major intracranial arteries or dural  venous sinuses. No intracranial atherosclerosis. Skull: The visualized skull base, calvarium and extracranial soft tissues are normal. Sinuses/Orbits: No fluid levels or advanced mucosal thickening of the visualized paranasal sinuses. No mastoid or middle ear effusion. The orbits are normal. ASPECTS [facility] Stroke Program Early CT Score) - Ganglionic level infarction (caudate, lentiform nuclei, internal capsule, insula, M1-M3 cortex): 7 - Supraganglionic infarction (M4-M6 cortex): 3 Total score (0-10 with 10 being normal): 10 IMPRESSION: 1. No hemorrhage. 2. ASPECTS is 10. 3. Diffuse severe atrophy and chronic ischemic microangiopathy. 4. Multiple old infarcts. These results were communicated to Dr. Lesleigh Noe at 8:25 pm on 11/05/2020 by text page via the [facility] messaging system. Electronically Signed   By: Ulyses Jarred M.D.   On: 11/05/2020 20:26        Scheduled Meds: . amLODipine  10 mg Oral Daily  . apixaban  2.5 mg Oral BID  . hydrALAZINE  50 mg Oral Q8H  . insulin aspart  0-15 Units Subcutaneous TID WC  . [START ON 11/08/2020] insulin glargine  20 Units Subcutaneous QHS  . sertraline  100 mg Oral QHS   Continuous Infusions: . sodium chloride 75 mL/hr at 11/07/20 0933  . cefTRIAXone (ROCEPHIN)  IV 1 g (11/07/20 0509)     LOS: 0 days    Time spent: 32 minutes    Hosie Poisson, MD [facility]   To contact the attending provider between 7A-7P or the covering provider during after hours 7P-7A, please log into the web site [email or web address] and access using universal [place] password for that web site. If you do not have the password, please call the hospital operator.  11/07/2020, 3:09 PM

## 2020-11-07 NOTE — Progress Notes (Signed)
CBG 42, patient awake and able to take small sips juice.  D50 25 ml given IV.  CBG 161 after intervention.  Dr Karleen Hampshire notified.

## 2020-11-08 DIAGNOSIS — R4182 Altered mental status, unspecified: Secondary | ICD-10-CM | POA: Diagnosis not present

## 2020-11-08 DIAGNOSIS — N179 Acute kidney failure, unspecified: Secondary | ICD-10-CM | POA: Diagnosis not present

## 2020-11-08 LAB — BASIC METABOLIC PANEL
Anion gap: 6 (ref 5–15)
BUN: 24 mg/dL — ABNORMAL HIGH (ref 8–23)
CO2: 23 mmol/L (ref 22–32)
Calcium: 8.5 mg/dL — ABNORMAL LOW (ref 8.9–10.3)
Chloride: 107 mmol/L (ref 98–111)
Creatinine, Ser: 1.17 mg/dL — ABNORMAL HIGH (ref 0.44–1.00)
GFR, Estimated: 46 mL/min — ABNORMAL LOW (ref >60)
Glucose, Bld: 136 mg/dL — ABNORMAL HIGH (ref 70–99)
Potassium: 3.6 mmol/L (ref 3.5–5.1)
Sodium: 136 mmol/L (ref 135–145)

## 2020-11-08 LAB — GLUCOSE, CAPILLARY
Glucose-Capillary: 157 mg/dL — ABNORMAL HIGH (ref 70–99)
Glucose-Capillary: 260 mg/dL — ABNORMAL HIGH (ref 70–99)
Glucose-Capillary: 260 mg/dL — ABNORMAL HIGH (ref 70–99)
Glucose-Capillary: 148 mg/dL — ABNORMAL HIGH (ref 70–99)

## 2020-11-08 LAB — URINE CULTURE: Culture: >=100000 — AB

## 2020-11-08 MED ORDER — CEPHALEXIN 500 MG PO CAPS
500.0000 mg | ORAL_CAPSULE | Freq: Two times a day (BID) | ORAL | Status: DC
  Administered 2020-11-09 – 2020-11-10 (×3): 500 mg via ORAL
  Filled 2020-11-08 (×3): qty 1

## 2020-11-08 MED ORDER — VANCOMYCIN 50 MG/ML ORAL SOLUTION
125.0000 mg | Freq: Four times a day (QID) | ORAL | Status: DC
  Administered 2020-11-08 – 2020-11-10 (×10): 125 mg via ORAL
  Filled 2020-11-08 (×12): qty 2.5

## 2020-11-08 NOTE — Progress Notes (Signed)
Inpatient Diabetes Program Recommendations  AACE/ADA: New Consensus Statement on Inpatient Glycemic Control (2015)  Target Ranges:  Prepandial:   less than 140 mg/dL      Peak postprandial:   less than 180 mg/dL (1-2 hours)      Critically ill patients:  140 - 180 mg/dL   Lab Results  Component Value Date   GLUCAP 260 (H) 11/08/2020   HGBA1C 11.2 (H) 11/06/2020    Review of Glycemic Control Results for Christy Simmons, Christy Simmons (MRN [id number]) as of 11/08/2020 14:30  Ref. Range 11/07/2020 06:56 11/07/2020 12:07 11/07/2020 16:33 11/07/2020 21:51 11/08/2020 06:29 11/08/2020 11:24  Glucose-Capillary Latest Ref Range: 70 - 99 mg/dL 161 (H) 280 (H) 164 (H) 156 (H) 157 (H) 260 (H)   Diabetes history:  DM2 Outpatient Diabetes medications:  Novolog 0-10 units TID Lantus 25 units daily Current orders for Inpatient glycemic control:  Lantus 20 units daily Novolog 0-15 units TID  Inpatient Diabetes Program Recommendations:    If post prandials remain elevated might consider:  -Novolog 2 units TID with meals if eats at least 50%   Will continue to follow while inpatient.  Thank you, Reche Dixon, RN, BSN Diabetes Coordinator Inpatient Diabetes Program [phone] (team pager from 8a-5p)
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
## 2020-11-08 NOTE — Progress Notes (Signed)
PROGRESS NOTE    Christy Simmons  FGH:[id number] DOB: [birth year] DOA: 11/05/2020 PCP: Cyndi Bender, PA-C    Chief Complaint  Patient presents with  . Code Stroke    Brief Narrative:  86 year old lady with prior history of right MCA stroke status post intra-arterial intervention, dementia, DVT, atrial septal aneurysm, hypertension hyperlipidemia diabetes mellitus presents to the hospital with altered mental status and confusion.  Patient at baseline is wheelchair-bound and dependent on it for activities of daily living.  On arrival to ED she was found to be in hypertensive crisis.  Neurology consulted, recommended palliative care consult. MRI of the brain without contrast is negative for stroke.  Her urine analysis showed trace leukocytes and many bacteria, pyuria.  She was started on IV Rocephin and urine culture sent.  CT angiogram of the head and neck showed Moderate stenosis of the distal left PCA P2 segment.  Alcohol level is negative.  UDS is negative.  TSH within normal limits. Palliative care consulted for goals of care. No new complaints. She appears comfortable. Opens eyes on verbal cues and answering simple questions. She appears very deconditioned.     Assessment & Plan:   Principal Problem:   AMS (altered mental status) Active Problems:   Diabetes ([place])   DVT of lower extremity, bilateral (HCC)   Hypertensive urgency   AKI (acute kidney injury) ([place])   Pressure injury of skin   Altered mental status, unspecified   Acute metabolic encephalopathy probably secondary to UTI vs polypharmacy.  UDS is negative TSH is within normal limits COVID-19 is negative Urne Analysis shows pyuria, trace leukocytes. She was started on IV rocephin for UTI. Urine cultures are pending. Avoid sedating medications.   MRI is negative for MRI.  Urine culture show  E COLI sensitive to rocephin. She has completed 3 days of IV rocephin , transition to oral keflex for another 2 days to complete the  course.     DVT: -continue with Eliquis.    Hypertensive Crisis: BP parameters appears to be optimal.  continue with norvasc and hydralazine.    Insulin dependent DM; Uncontrolled with hyperglycemia and hypoglycemia. CBG (last 3)  Recent Labs    11/07/20 2151 11/08/20 0629 11/08/20 1124  GLUCAP 156* 157* 260*    Hemoglobin A1c is 11.2. restart lantus tonight.  Continue with SSI.    H/o prior stroke , with MCA involvement.  Continue with eliquis.    Hypokalemia:  Replaced. repea levels wnl.  Nausea and vomiting Appears to have resolved.    Mild AKI Probably prerenal azotemia from dehydration continue with IV fluids.   Hyponatremia Resolved. Possibly from dehydration.     Deconditioning Therapy evaluations ordered.   QT prolongation probably secondary to electrolyte abnormalities. Replace potassium keep it greater than 4 and magnesium greater than 2. Repeat EKG.  Pressure Injury 11/06/20 Arm Distal;Left;Posterior;Upper Stage 1 -  Intact skin with non-blanchable redness of a localized area usually over a bony prominence. (Active)  11/06/20 0244  Location: Arm  Location Orientation: Distal;Left;Posterior;Upper  Staging: Stage 1 -  Intact skin with non-blanchable redness of a localized area usually over a bony prominence.  Wound Description (Comments):   Present on Admission: Yes     Pressure Injury 11/06/20 Tibial Left;Posterior Deep Tissue Pressure Injury - Purple or maroon localized area of discolored intact skin or blood-filled blister due to damage of underlying soft tissue from pressure and/or shear. scattered down calf (Active)  11/06/20 0246  Location: Tibial  Location Orientation: Left;Posterior  Staging: Deep Tissue Pressure Injury - Purple or maroon localized area of discolored intact skin or blood-filled blister due to damage of underlying soft tissue from pressure and/or shear.  Wound Description (Comments): scattered down calf  Present on  Admission: Yes     Pressure Injury 11/06/20 Tibial Posterior;Proximal;Right Deep Tissue Pressure Injury - Purple or maroon localized area of discolored intact skin or blood-filled blister due to damage of underlying soft tissue from pressure and/or shear. scattered on calf (Active)  11/06/20 0247  Location: Tibial  Location Orientation: Posterior;Proximal;Right  Staging: Deep Tissue Pressure Injury - Purple or maroon localized area of discolored intact skin or blood-filled blister due to damage of underlying soft tissue from pressure and/or shear.  Wound Description (Comments): scattered on calf area  Present on Admission: Yes   Wound care will be consulted.   Diarrhea / loose stools: c diff pcr is positive.  Started on oral vancomycin .     DVT prophylaxis: eliquis. Code Status: DNR.  Family Communication: discussed with daughter at bedside.  Disposition:   Status is: inpatient.   The patient will require care spanning > 2 midnights and should be moved to inpatient because: Ongoing diagnostic testing needed not appropriate for outpatient work up and IV treatments appropriate due to intensity of illness or inability to take PO  Dispo: The patient is from: Home              Anticipated d/c is to: pending              Patient currently is not medically stable to d/c.   Difficult to place patient No       Consultants:   Neurology Palliative care   Procedures: MRI brain is negative.    Antimicrobials: rocephin for UTI   Subjective: She is deconditioned, able to answer simple questions, reports some soreness in the belly.   Objective: Vitals:   11/08/20 0317 11/08/20 0835 11/08/20 1025 11/08/20 1125  BP: (!) 156/73 106/66 (!) 146/60 (!) 154/55  Pulse: (!) 57 78  60  Resp: 17 16  18   Temp: (!) 97.5 F (36.4 C) 98.9 F (37.2 C)  97.9 F (36.6 C)  TempSrc: Oral Oral  Oral  SpO2: 98% 97%  98%  Weight:      Height:        Intake/Output Summary (Last 24 hours)  at 11/08/2020 1438 Last data filed at 11/08/2020 1300 Gross per 24 hour  Intake 480 ml  Output 400 ml  Net 80 ml   Filed Weights   11/05/20 2039  Weight: 55 kg    Examination:  General exam: elderly lady appears deconditioned, but comfortable.  Respiratory system: diminished air entry at bases.  Cardiovascular system: S1S2, RRR, no JVD,  Gastrointestinal system: Abdomen is soft, mild tenderness in the lower quadrant. Bowel sounds wnl.  Central nervous system: alert, able to answer simple questions.  Extremities:no pedal edema.  Skin: pressure injury over the tibia.  Psychiatry: cannot be assessed.     Data Reviewed: I have personally reviewed following labs and imaging studies  CBC: Recent Labs  Lab 11/05/20 2013 11/05/20 2017 11/06/20 0240 11/07/20 0246  WBC 14.0*  --  12.7* 11.0*  NEUTROABS 10.0*  --   --  7.2  HGB 14.4 14.3 12.7 10.8*  HCT 42.7 42.0 36.1 31.0*  MCV 86.6  --  82.8 84.5  PLT 266  --  275 314    Basic Metabolic Panel: Recent Labs  Lab 11/05/20  2017 11/05/20 2053 11/06/20 0240 11/06/20 1556 11/07/20 0246 11/08/20 0307  NA 130* 130* 133*  --  136 136  K 3.6 3.2* 2.9* 3.3* 3.2* 3.6  CL 97* 93* 97*  --  107 107  CO2  --  24 26  --  22 23  GLUCOSE 414* 348* 113*  --  50* 136*  BUN 33* 28* 25*  --  26* 24*  CREATININE 1.00 1.32* 1.06*  --  1.18* 1.17*  CALCIUM  --  9.8 9.7  --  9.0 8.5*  MG  --   --  1.7  --  1.7  --     GFR: Estimated Creatinine Clearance: 31.1 mL/min (A) (by C-G formula based on SCr of 1.17 mg/dL (H)).  Liver Function Tests: Recent Labs  Lab 11/05/20 2053  AST 23  ALT 17  ALKPHOS 62  BILITOT 0.7  PROT 5.9*  ALBUMIN 3.0*    CBG: Recent Labs  Lab 11/07/20 1207 11/07/20 1633 11/07/20 2151 11/08/20 0629 11/08/20 1124  GLUCAP 280* 164* 156* 157* 260*     Recent Results (from the past 240 hour(s))  Resp Panel by RT-PCR (Flu A&B, Covid) Nasopharyngeal Swab     Status: None   Collection Time: 11/05/20 11:40  PM   Specimen: Nasopharyngeal Swab; Nasopharyngeal(NP) swabs in vial transport medium  Result Value Ref Range Status   SARS Coronavirus 2 by RT PCR NEGATIVE NEGATIVE Final    Comment: (NOTE) SARS-CoV-2 target nucleic acids are NOT DETECTED.  The SARS-CoV-2 RNA is generally detectable in upper respiratory specimens during the acute phase of infection. The lowest concentration of SARS-CoV-2 viral copies this assay can detect is 138 copies/mL. A negative result does not preclude SARS-Cov-2 infection and should not be used as the sole basis for treatment or other patient management decisions. A negative result may occur with  improper specimen collection/handling, submission of specimen other than nasopharyngeal swab, presence of viral mutation(s) within the areas targeted by this assay, and inadequate number of viral copies(<138 copies/mL). A negative result must be combined with clinical observations, patient history, and epidemiological information. The expected result is Negative.  Fact Sheet for Patients:  EntrepreneurPulse.com.au  Fact Sheet for Healthcare Providers:  IncredibleEmployment.be  This test is no t yet approved or cleared by the [place] FDA and  has been authorized for detection and/or diagnosis of SARS-CoV-2 by FDA under an Emergency Use Authorization (EUA). This EUA will remain  in effect (meaning this test can be used) for the duration of the COVID-19 declaration under Section 564(b)(1) of the Act, 21 U.S.C.section 360bbb-3(b)(1), unless the authorization is terminated  or revoked sooner.       Influenza A by PCR NEGATIVE NEGATIVE Final   Influenza B by PCR NEGATIVE NEGATIVE Final    Comment: (NOTE) The Xpert Xpress SARS-CoV-2/FLU/RSV plus assay is intended as an aid in the diagnosis of influenza from Nasopharyngeal swab specimens and should not be used as a sole basis for treatment. Nasal washings and aspirates are  unacceptable for Xpert Xpress SARS-CoV-2/FLU/RSV testing.  Fact Sheet for Patients: EntrepreneurPulse.com.au  Fact Sheet for Healthcare Providers: IncredibleEmployment.be  This test is not yet approved or cleared by the [place] FDA and has been authorized for detection and/or diagnosis of SARS-CoV-2 by FDA under an Emergency Use Authorization (EUA). This EUA will remain in effect (meaning this test can be used) for the duration of the COVID-19 declaration under Section 564(b)(1) of the Act, 21 U.S.C. section 360bbb-3(b)(1), unless the authorization  is terminated or revoked.  Performed at [facility], [place] [address], [place], [place] [address]   Culture, Urine     Status: Abnormal   Collection Time: 11/06/20  4:04 AM   Specimen: Urine, Random  Result Value Ref Range Status   Specimen Description URINE, RANDOM  Final   Special Requests   Final    NONE Performed at [facility], [place] [address], [place], [place] [address]    Culture >=100,000 COLONIES/mL ESCHERICHIA COLI (A)  Final   Report Status 11/08/2020 FINAL  Final   Organism ID, Bacteria ESCHERICHIA COLI (A)  Final      Susceptibility   Escherichia coli - MIC*    AMPICILLIN 4 SENSITIVE Sensitive     CEFAZOLIN <=4 SENSITIVE Sensitive     CEFEPIME <=0.12 SENSITIVE Sensitive     CEFTRIAXONE <=0.25 SENSITIVE Sensitive     CIPROFLOXACIN >=4 RESISTANT Resistant     GENTAMICIN <=1 SENSITIVE Sensitive     IMIPENEM <=0.25 SENSITIVE Sensitive     NITROFURANTOIN <=16 SENSITIVE Sensitive     TRIMETH/SULFA <=20 SENSITIVE Sensitive     AMPICILLIN/SULBACTAM <=2 SENSITIVE Sensitive     PIP/TAZO <=4 SENSITIVE Sensitive     * >=100,000 COLONIES/mL ESCHERICHIA COLI  C Difficile Quick Screen w PCR reflex     Status: Abnormal   Collection Time: 11/07/20  4:31 PM   Specimen: STOOL  Result Value Ref Range Status   C Diff antigen POSITIVE (A) NEGATIVE Final   C Diff toxin NEGATIVE  NEGATIVE Final   C Diff interpretation Results are indeterminate. See PCR results.  Final    Comment: Performed at [facility], [place] [address], [place], [place] [address]  C. Diff by PCR, Reflexed     Status: Abnormal   Collection Time: 11/07/20  4:31 PM  Result Value Ref Range Status   Toxigenic C. Difficile by PCR POSITIVE (A) NEGATIVE Final    Comment: Positive for toxigenic C. difficile with little to no toxin production. Only treat if clinical presentation suggests symptomatic illness. Performed at [facility], [place] [address] [place], [place] [address]          Radiology Studies: No results found.      Scheduled Meds: . amLODipine  10 mg Oral Daily  . apixaban  2.5 mg Oral BID  . [START ON 11/09/2020] cephALEXin  500 mg Oral Q12H  . hydrALAZINE  50 mg Oral Q8H  . insulin aspart  0-15 Units Subcutaneous TID WC  . insulin glargine  20 Units Subcutaneous QHS  . sertraline  100 mg Oral QHS  . vancomycin  125 mg Oral QID   Continuous Infusions: . sodium chloride 75 mL/hr at 11/07/20 2344     LOS: 1 day    Time spent: 32 minutes    Hosie Poisson, MD [facility]   To contact the attending provider between 7A-7P or the covering provider during after hours 7P-7A, please log into the web site [email or web address] and access using universal [place] password for that web site. If you do not have the password, please call the hospital operator.  11/08/2020, 2:38 PM

## 2020-11-09 ENCOUNTER — Other Ambulatory Visit (HOSPITAL_COMMUNITY): Payer: Self-pay

## 2020-11-09 DIAGNOSIS — N390 Urinary tract infection, site not specified: Secondary | ICD-10-CM | POA: Diagnosis present

## 2020-11-09 DIAGNOSIS — R4182 Altered mental status, unspecified: Secondary | ICD-10-CM | POA: Diagnosis not present

## 2020-11-09 DIAGNOSIS — I824Z3 Acute embolism and thrombosis of unspecified deep veins of distal lower extremity, bilateral: Secondary | ICD-10-CM | POA: Diagnosis not present

## 2020-11-09 DIAGNOSIS — N179 Acute kidney failure, unspecified: Secondary | ICD-10-CM | POA: Diagnosis not present

## 2020-11-09 DIAGNOSIS — A0472 Enterocolitis due to Clostridium difficile, not specified as recurrent: Secondary | ICD-10-CM

## 2020-11-09 LAB — BASIC METABOLIC PANEL
Anion gap: 5 (ref 5–15)
BUN: 26 mg/dL — ABNORMAL HIGH (ref 8–23)
CO2: 23 mmol/L (ref 22–32)
Calcium: 8.4 mg/dL — ABNORMAL LOW (ref 8.9–10.3)
Chloride: 109 mmol/L (ref 98–111)
Creatinine, Ser: 1.32 mg/dL — ABNORMAL HIGH (ref 0.44–1.00)
GFR, Estimated: 40 mL/min — ABNORMAL LOW (ref >60)
Glucose, Bld: 294 mg/dL — ABNORMAL HIGH (ref 70–99)
Potassium: 3.3 mmol/L — ABNORMAL LOW (ref 3.5–5.1)
Sodium: 137 mmol/L (ref 135–145)

## 2020-11-09 LAB — CBC WITH DIFFERENTIAL/PLATELET
Abs Immature Granulocytes: 0.06 10*3/uL (ref 0.00–0.07)
Basophils Absolute: 0.1 10*3/uL (ref 0.0–0.1)
Basophils Relative: 1 %
Eosinophils Absolute: 0.1 10*3/uL (ref 0.0–0.5)
Eosinophils Relative: 2 %
HCT: 33 % — ABNORMAL LOW (ref 36.0–46.0)
Hemoglobin: 10.9 g/dL — ABNORMAL LOW (ref 12.0–15.0)
Immature Granulocytes: 1 %
Lymphocytes Relative: 25 %
Lymphs Abs: 2.2 10*3/uL (ref 0.7–4.0)
MCH: 29.1 pg (ref 26.0–34.0)
MCHC: 33 g/dL (ref 30.0–36.0)
MCV: 88.2 fL (ref 80.0–100.0)
Monocytes Absolute: 0.6 10*3/uL (ref 0.1–1.0)
Monocytes Relative: 7 %
Neutro Abs: 5.6 10*3/uL (ref 1.7–7.7)
Neutrophils Relative %: 64 %
Platelets: 239 10*3/uL (ref 150–400)
RBC: 3.74 MIL/uL — ABNORMAL LOW (ref 3.87–5.11)
RDW: 13.8 % (ref 11.5–15.5)
WBC: 8.6 10*3/uL (ref 4.0–10.5)
nRBC: 0 % (ref 0.0–0.2)

## 2020-11-09 LAB — GLUCOSE, CAPILLARY
Glucose-Capillary: 78 mg/dL (ref 70–99)
Glucose-Capillary: 243 mg/dL — ABNORMAL HIGH (ref 70–99)
Glucose-Capillary: 318 mg/dL — ABNORMAL HIGH (ref 70–99)
Glucose-Capillary: 183 mg/dL — ABNORMAL HIGH (ref 70–99)

## 2020-11-09 MED ORDER — INSULIN ASPART 100 UNIT/ML ~~LOC~~ SOLN
2.0000 [IU] | Freq: Three times a day (TID) | SUBCUTANEOUS | Status: DC
  Administered 2020-11-09: 2 [IU] via SUBCUTANEOUS

## 2020-11-09 NOTE — Progress Notes (Signed)
Physical Therapy Treatment Patient Details Name: Christy Simmons MRN: [id number] DOB: [birth year] Today's Date: 11/09/2020    History of Present Illness 86 y/o female presented to ED on 4/8 with AMS, sudden emesis, and weakness. MRI negative for stroke. Neurology believes toxic/metabolic encephalopathy in setting of elevated blood glucose. PMH: dementia, GERD, HTN, HLD, R MCA stroke 2016, type 2 DM    PT Comments    Pt supine in bed on arrival this session.  Pt able to progress to standing with short bout of gt training.  Pt with mild LOB, continue to recommend snf placement at this time.     Follow Up Recommendations  SNF;Supervision/Assistance - 24 hour     Equipment Recommendations  Other (comment) (TBD)    Recommendations for Other Services       Precautions / Restrictions Precautions Precautions: Fall Restrictions Weight Bearing Restrictions: No    Mobility  Bed Mobility Overal bed mobility: Needs Assistance Bed Mobility: Supine to Sit;Sit to Supine     Supine to sit: Mod assist Sit to supine: Min assist   General bed mobility comments: Pt able to move with decreased assistance this session.  Cues for technique and using HHA this session to pull into sitting.  Pt able to maintain sitting once feet touching the floor.    Transfers Overall transfer level: Needs assistance Equipment used: Rolling walker (2 wheeled) Transfers: Sit to/from [facility] Sit to Stand: Mod assist         General transfer comment: Cues for hand placement this session.  Pt with poor eccentric loading.  Ambulation/Gait Ambulation/Gait assistance: Mod assist Gait Distance (Feet): 12 Feet (3 ft forward and 71ft back to bed.) Assistive device: Rolling walker (2 wheeled) Gait Pattern/deviations: Step-to pattern;Trunk flexed;Decreased stride length;Shuffle     General Gait Details: Cues for sequencing and RW placement.  Pt with mild LOB posterior when backing.   Stairs              Wheelchair Mobility    Modified Rankin (Stroke Patients Only)       Balance Overall balance assessment: Needs assistance Sitting-balance support: Bilateral upper extremity supported;Feet supported Sitting balance-Leahy Scale: Fair       Standing balance-Leahy Scale: Zero                              Cognition Arousal/Alertness: Lethargic Behavior During Therapy: WFL for tasks assessed/performed Overall Cognitive Status:  (not formally assessed but within functional limits this session.)                                        Exercises      General Comments        Pertinent Vitals/Pain Pain Assessment: Faces Faces Pain Scale: No hurt    Home Living                      Prior Function            PT Goals (current goals can now be found in the care plan section) Acute Rehab PT Goals Potential to Achieve Goals: Good Progress towards PT goals: Progressing toward goals    Frequency    Min 2X/week      PT Plan Current plan remains appropriate    Co-evaluation  AM-PAC PT "6 Clicks" Mobility   Outcome Measure  Help needed turning from your back to your side while in a flat bed without using bedrails?: A Lot Help needed moving from lying on your back to sitting on the side of a flat bed without using bedrails?: A Lot Help needed moving to and from a bed to a chair (including a wheelchair)?: A Lot Help needed standing up from a chair using your arms (e.g., wheelchair or bedside chair)?: A Lot Help needed to walk in hospital room?: Total Help needed climbing 3-5 steps with a railing? : Total 6 Click Score: 10    End of Session Equipment Utilized During Treatment: Gait belt Activity Tolerance: Patient tolerated treatment well Patient left: in bed;with call bell/phone within reach;with bed alarm set Nurse Communication: Mobility status PT Visit Diagnosis: Unsteadiness on feet  (R26.81);Muscle weakness (generalized) (M62.81);Difficulty in walking, not elsewhere classified (R26.2)     Time: 3888-2800 PT Time Calculation (min) (ACUTE ONLY): 20 min  Charges:  $Gait Training: 8-22 mins                     Erasmo Leventhal , PTA Acute Rehabilitation Services Pager [phone] Office [phone]     Oluwatamilore Starnes Eli Hose 11/09/2020, 2:52 PM

## 2020-11-09 NOTE — Progress Notes (Signed)
PROGRESS NOTE    Christy Simmons  GDJ:[id number] DOB: [birth year] DOA: 11/05/2020 PCP: Cyndi Bender, PA-C    Chief Complaint  Patient presents with  . Code Stroke    Brief Narrative:  86 year old lady with prior history of right MCA stroke status post intra-arterial intervention, dementia, DVT, atrial septal aneurysm, hypertension hyperlipidemia diabetes mellitus presents to the hospital with altered mental status and confusion.  Patient at baseline is wheelchair-bound and dependent on it for activities of daily living.  On arrival to ED she was found to be in hypertensive crisis.  Neurology consulted, recommended palliative care consult. MRI of the brain without contrast is negative for stroke.  Her urine analysis showed trace leukocytes and many bacteria, pyuria.  She was started on IV Rocephin and urine culture sent.  CT angiogram of the head and neck showed Moderate stenosis of the distal left PCA P2 segment.  Alcohol level is negative.  UDS is negative.  TSH within normal limits. She was having diarrhea on admission, c diff pcr is positive. She was started on oral vancomycin.  Palliative care consulted for goals of care as she appears deconditioned, has poor oral intake.  Pt seen and examined at bedside.     Assessment & Plan:   Principal Problem:   AMS (altered mental status) Active Problems:   Diabetes ([facility])   DVT of lower extremity, bilateral ([facility])   Hypertensive urgency   AKI (acute kidney injury) ([place])   Pressure injury of skin   Altered mental status, unspecified   Acute metabolic encephalopathy probably secondary to UTI  And C diff infection vs polypharmacy.  UDS is negative TSH is within normal limits COVID-19 is negative Urne Analysis shows pyuria, trace leukocytes. She was started on IV rocephin for  E coli UTI.   MRI is negative for stroke. Urine culture show  E COLI sensitive to rocephin. She has completed 3 days of IV rocephin , transition to oral keflex for another  2 days to complete the course.    Cdiff diarrhea:  c diff pcr is positive. Started her on oral vancomycin .  She continues to have diarrhea,  Had 5 bowel movements in the last 24 hours, check daily BMP.  Afebrile and wbc normalized today.   DVT: -continue with Eliquis.    Hypertensive Crisis: BP parameters better controlled.  continue with norvasc and hydralazine.    Insulin dependent DM; Uncontrolled with hyperglycemia and hypoglycemia. CBG (last 3)  Recent Labs    11/08/20 2133 11/09/20 0621 11/09/20 1239  GLUCAP 148* 78 243*    Hemoglobin A1c is 11.2. restart lantus tonight.  Continue with SSI. Will add 2 units of Novolog TIDAC if able to consume > 50% of meals.    H/o prior stroke , with MCA involvement.  Continue with eliquis.    Hypokalemia:  Replaced. repeat levels wnl.    Nausea and vomiting Appears to have resolved.    Mild AKI Probably prerenal azotemia from dehydration continue with IV fluids. Recheck bmp in am.    Hyponatremia Resolved. Possibly from dehydration.     Deconditioning Therapy evaluations ordered recommending SNF, but family wants to take her home.    QT prolongation probably secondary to electrolyte abnormalities. Replace potassium keep it greater than 4 and magnesium greater than 2.   Pressure Injury 11/06/20 Arm Distal;Left;Posterior;Upper Stage 1 -  Intact skin with non-blanchable redness of a localized area usually over a bony prominence. (Active)  11/06/20 0244  Location: Arm  Location  Orientation: Distal;Left;Posterior;Upper  Staging: Stage 1 -  Intact skin with non-blanchable redness of a localized area usually over a bony prominence.  Wound Description (Comments):   Present on Admission: Yes     Pressure Injury 11/06/20 Tibial Left;Posterior Deep Tissue Pressure Injury - Purple or maroon localized area of discolored intact skin or blood-filled blister due to damage of underlying soft tissue from pressure and/or  shear. scattered down calf (Active)  11/06/20 0246  Location: Tibial  Location Orientation: Left;Posterior  Staging: Deep Tissue Pressure Injury - Purple or maroon localized area of discolored intact skin or blood-filled blister due to damage of underlying soft tissue from pressure and/or shear.  Wound Description (Comments): scattered down calf  Present on Admission: Yes     Pressure Injury 11/06/20 Tibial Posterior;Proximal;Right Deep Tissue Pressure Injury - Purple or maroon localized area of discolored intact skin or blood-filled blister due to damage of underlying soft tissue from pressure and/or shear. scattered on calf (Active)  11/06/20 0247  Location: Tibial  Location Orientation: Posterior;Proximal;Right  Staging: Deep Tissue Pressure Injury - Purple or maroon localized area of discolored intact skin or blood-filled blister due to damage of underlying soft tissue from pressure and/or shear.  Wound Description (Comments): scattered on calf area  Present on Admission: Yes   Wound care will be consulted.       DVT prophylaxis: eliquis. Code Status: DNR.  Family Communication: discussed with daughter over the phone on 11/09/20 Disposition:   Status is: inpatient.   The patient will require care spanning > 2 midnights and should be moved to inpatient because: Ongoing diagnostic testing needed not appropriate for outpatient work up and IV treatments appropriate due to intensity of illness or inability to take PO  Dispo: The patient is from: Home              Anticipated d/c is to: pending              Patient currently is not medically stable to d/c.   Difficult to place patient No       Consultants:   Neurology Palliative care   Procedures: MRI brain is negative.    Antimicrobials: rocephin for UTI   Subjective: Alert and comfortable, no chest pain, some nausea, no vomiting. Continues to have diarrhea.   Objective: Vitals:   11/09/20 0434 11/09/20 0855  11/09/20 0906 11/09/20 1240  BP: (!) 161/67 134/77 (!) 130/91 (!) 144/58  Pulse: 65 72 86   Resp: 18     Temp: 98.3 F (36.8 C) 97.8 F (36.6 C) 98.5 F (36.9 C) 97.9 F (36.6 C)  TempSrc: Axillary Oral Oral Oral  SpO2: 95% 96% 100%   Weight:      Height:        Intake/Output Summary (Last 24 hours) at 11/09/2020 1406 Last data filed at 11/09/2020 0300 Gross per 24 hour  Intake 596 ml  Output --  Net 596 ml   Filed Weights   11/05/20 2039  Weight: 55 kg    Examination:  General exam: Elderly lady, very deconditioned, not in distress.  Respiratory system: air entry air, no wheezing heard.  Cardiovascular system: s1s2 heard, RRR, no JVD.  Gastrointestinal system: Abdomen is soft, mild tenderness, bowel sounds wnl.  Central nervous system: Alert and able to answer to simple questions.   Extremities: no pedal edema.  Skin: pressure injury over the tibia.  Psychiatry: cannot be assessed.     Data Reviewed: I have personally reviewed following labs  and imaging studies  CBC: Recent Labs  Lab 11/05/20 2013 11/05/20 2017 11/06/20 0240 11/07/20 0246 11/09/20 1253  WBC 14.0*  --  12.7* 11.0* 8.6  NEUTROABS 10.0*  --   --  7.2 5.6  HGB 14.4 14.3 12.7 10.8* 10.9*  HCT 42.7 42.0 36.1 31.0* 33.0*  MCV 86.6  --  82.8 84.5 88.2  PLT 266  --  275 240 742    Basic Metabolic Panel: Recent Labs  Lab 11/05/20 2017 11/05/20 2053 11/06/20 0240 11/06/20 1556 11/07/20 0246 11/08/20 0307  NA 130* 130* 133*  --  136 136  K 3.6 3.2* 2.9* 3.3* 3.2* 3.6  CL 97* 93* 97*  --  107 107  CO2  --  24 26  --  22 23  GLUCOSE 414* 348* 113*  --  50* 136*  BUN 33* 28* 25*  --  26* 24*  CREATININE 1.00 1.32* 1.06*  --  1.18* 1.17*  CALCIUM  --  9.8 9.7  --  9.0 8.5*  MG  --   --  1.7  --  1.7  --     GFR: Estimated Creatinine Clearance: 31.1 mL/min (A) (by C-G formula based on SCr of 1.17 mg/dL (H)).  Liver Function Tests: Recent Labs  Lab 11/05/20 2053  AST 23  ALT 17   ALKPHOS 62  BILITOT 0.7  PROT 5.9*  ALBUMIN 3.0*    CBG: Recent Labs  Lab 11/08/20 1124 11/08/20 1621 11/08/20 2133 11/09/20 0621 11/09/20 1239  GLUCAP 260* 260* 148* 78 243*     Recent Results (from the past 240 hour(s))  Resp Panel by RT-PCR (Flu A&B, Covid) Nasopharyngeal Swab     Status: None   Collection Time: 11/05/20 11:40 PM   Specimen: Nasopharyngeal Swab; Nasopharyngeal(NP) swabs in vial transport medium  Result Value Ref Range Status   SARS Coronavirus 2 by RT PCR NEGATIVE NEGATIVE Final    Comment: (NOTE) SARS-CoV-2 target nucleic acids are NOT DETECTED.  The SARS-CoV-2 RNA is generally detectable in upper respiratory specimens during the acute phase of infection. The lowest concentration of SARS-CoV-2 viral copies this assay can detect is 138 copies/mL. A negative result does not preclude SARS-Cov-2 infection and should not be used as the sole basis for treatment or other patient management decisions. A negative result may occur with  improper specimen collection/handling, submission of specimen other than nasopharyngeal swab, presence of viral mutation(s) within the areas targeted by this assay, and inadequate number of viral copies(<138 copies/mL). A negative result must be combined with clinical observations, patient history, and epidemiological information. The expected result is Negative.  Fact Sheet for Patients:  EntrepreneurPulse.com.au  Fact Sheet for Healthcare Providers:  IncredibleEmployment.be  This test is no t yet approved or cleared by the [place] FDA and  has been authorized for detection and/or diagnosis of SARS-CoV-2 by FDA under an Emergency Use Authorization (EUA). This EUA will remain  in effect (meaning this test can be used) for the duration of the COVID-19 declaration under Section 564(b)(1) of the Act, 21 U.S.C.section 360bbb-3(b)(1), unless the authorization is terminated  or revoked  sooner.       Influenza A by PCR NEGATIVE NEGATIVE Final   Influenza B by PCR NEGATIVE NEGATIVE Final    Comment: (NOTE) The Xpert Xpress SARS-CoV-2/FLU/RSV plus assay is intended as an aid in the diagnosis of influenza from Nasopharyngeal swab specimens and should not be used as a sole basis for treatment. Nasal washings and aspirates are unacceptable  for Xpert Xpress SARS-CoV-2/FLU/RSV testing.  Fact Sheet for Patients: EntrepreneurPulse.com.au  Fact Sheet for Healthcare Providers: IncredibleEmployment.be  This test is not yet approved or cleared by the [place] FDA and has been authorized for detection and/or diagnosis of SARS-CoV-2 by FDA under an Emergency Use Authorization (EUA). This EUA will remain in effect (meaning this test can be used) for the duration of the COVID-19 declaration under Section 564(b)(1) of the Act, 21 U.S.C. section 360bbb-3(b)(1), unless the authorization is terminated or revoked.  Performed at [facility], [place] [address] [place], [place] [address]   Culture, Urine     Status: Abnormal   Collection Time: 11/06/20  4:04 AM   Specimen: Urine, Random  Result Value Ref Range Status   Specimen Description URINE, RANDOM  Final   Special Requests   Final    NONE Performed at [facility], [place] [address], [place], [place] [address]    Culture >=100,000 COLONIES/mL ESCHERICHIA COLI (A)  Final   Report Status 11/08/2020 FINAL  Final   Organism ID, Bacteria ESCHERICHIA COLI (A)  Final      Susceptibility   Escherichia coli - MIC*    AMPICILLIN 4 SENSITIVE Sensitive     CEFAZOLIN <=4 SENSITIVE Sensitive     CEFEPIME <=0.12 SENSITIVE Sensitive     CEFTRIAXONE <=0.25 SENSITIVE Sensitive     CIPROFLOXACIN >=4 RESISTANT Resistant     GENTAMICIN <=1 SENSITIVE Sensitive     IMIPENEM <=0.25 SENSITIVE Sensitive     NITROFURANTOIN <=16 SENSITIVE Sensitive     TRIMETH/SULFA <=20 SENSITIVE Sensitive      AMPICILLIN/SULBACTAM <=2 SENSITIVE Sensitive     PIP/TAZO <=4 SENSITIVE Sensitive     * >=100,000 COLONIES/mL ESCHERICHIA COLI  C Difficile Quick Screen w PCR reflex     Status: Abnormal   Collection Time: 11/07/20  4:31 PM   Specimen: STOOL  Result Value Ref Range Status   C Diff antigen POSITIVE (A) NEGATIVE Final   C Diff toxin NEGATIVE NEGATIVE Final   C Diff interpretation Results are indeterminate. See PCR results.  Final    Comment: Performed at [facility], [place] [address], [place], [place] [address]  C. Diff by PCR, Reflexed     Status: Abnormal   Collection Time: 11/07/20  4:31 PM  Result Value Ref Range Status   Toxigenic C. Difficile by PCR POSITIVE (A) NEGATIVE Final    Comment: Positive for toxigenic C. difficile with little to no toxin production. Only treat if clinical presentation suggests symptomatic illness. Performed at [facility], [place] [address], [place], [place] [address]          Radiology Studies: No results found.      Scheduled Meds: . amLODipine  10 mg Oral Daily  . apixaban  2.5 mg Oral BID  . cephALEXin  500 mg Oral Q12H  . hydrALAZINE  50 mg Oral Q8H  . insulin aspart  0-15 Units Subcutaneous TID WC  . insulin glargine  20 Units Subcutaneous QHS  . sertraline  100 mg Oral QHS  . vancomycin  125 mg Oral QID   Continuous Infusions: . sodium chloride 75 mL/hr at 11/09/20 0432     LOS: 2 days    Time spent: 32 minutes    Hosie Poisson, MD [facility]   To contact the attending provider between 7A-7P or the covering provider during after hours 7P-7A, please log into the web site [email or web address] and access using universal  [place] password for that web site. If you do not have the password, please call the hospital operator.  11/09/2020, 2:06 PM

## 2020-11-09 NOTE — Consult Note (Signed)
WOC Nurse Consult Note: Patient receiving care in [facility] [phone]. I spoke with her primary RN, Ireti, via telephone Reason for Consult: "pressure injury" Wound type: Ireti explains the patient does not have pressure injuries, or open wounds, but total body scattered bruising. Pressure Injury POA: Yes/No/NA Measurement: Wound bed: Drainage (amount, consistency, odor)  Periwound: Dressing procedure/placement/frequency: I have ordered bilateral Prevalon heel lift boots. Kellie Simmering [phone]. [facility] nurse will not follow at this time.  Please re-consult the [facility] team if needed.  Val Riles, RN, MSN, CWOCN, CNS-BC, pager [phone]

## 2020-11-09 NOTE — Progress Notes (Signed)
No PSVT noted or seen by this nurse. No PSVT on record for this patient as indicated by monitor tech.  Monitor tech did not report to this nurse about this finding even though it was charted that she reported it. Patient heart rates and heart rhythms were within normal limit all through my 12 hours shift.

## 2020-11-09 NOTE — Plan of Care (Signed)

## 2020-11-09 NOTE — Progress Notes (Signed)
Inpatient Diabetes Program Recommendations  AACE/ADA: New Consensus Statement on Inpatient Glycemic Control (2015)  Target Ranges:  Prepandial:   less than 140 mg/dL      Peak postprandial:   less than 180 mg/dL (1-2 hours)      Critically ill patients:  140 - 180 mg/dL   Lab Results  Component Value Date   GLUCAP 78 11/09/2020   HGBA1C 11.2 (H) 11/06/2020    Review of Glycemic Control Results for Christy Simmons, Christy Simmons (MRN [id number]) as of 11/09/2020 09:39  Ref. Range 11/08/2020 06:29 11/08/2020 11:24 11/08/2020 16:21 11/08/2020 21:33 11/09/2020 06:21  Glucose-Capillary Latest Ref Range: 70 - 99 mg/dL 157 (H) 260 (H) Novolog 8 units 260 (H) Novolog 8 units 148 (H) 78     Inpatient Diabetes Program Recommendations:     Please consider Novolog 2 units TID with meals IF eats at least 50%.  Likely 78 mg/dL this am due to elevated post prandial & correction.    Will continue to follow while inpatient.  Thank you, Reche Dixon, RN, BSN Diabetes Coordinator Inpatient Diabetes Program [phone] (team pager from 8a-5p)

## 2020-11-10 ENCOUNTER — Other Ambulatory Visit (HOSPITAL_COMMUNITY): Payer: Self-pay

## 2020-11-10 DIAGNOSIS — R531 Weakness: Secondary | ICD-10-CM | POA: Diagnosis not present

## 2020-11-10 DIAGNOSIS — E440 Moderate protein-calorie malnutrition: Secondary | ICD-10-CM

## 2020-11-10 DIAGNOSIS — Z515 Encounter for palliative care: Secondary | ICD-10-CM | POA: Diagnosis not present

## 2020-11-10 LAB — GLUCOSE, CAPILLARY
Glucose-Capillary: 45 mg/dL — ABNORMAL LOW (ref 70–99)
Glucose-Capillary: 112 mg/dL — ABNORMAL HIGH (ref 70–99)
Glucose-Capillary: 68 mg/dL — ABNORMAL LOW (ref 70–99)
Glucose-Capillary: 135 mg/dL — ABNORMAL HIGH (ref 70–99)
Glucose-Capillary: 189 mg/dL — ABNORMAL HIGH (ref 70–99)

## 2020-11-10 MED ORDER — AMLODIPINE BESYLATE 10 MG PO TABS: 10.0000 mg | ORAL_TABLET | Freq: Every day | ORAL | 3 refills | Status: DC

## 2020-11-10 MED ORDER — INSULIN GLARGINE 100 UNIT/ML ~~LOC~~ SOLN
15.0000 [IU] | Freq: Every evening | SUBCUTANEOUS | 11 refills | Status: DC
Start: 2020-11-10 — End: 2020-11-10

## 2020-11-10 MED ORDER — INSULIN GLARGINE 100 UNIT/ML ~~LOC~~ SOLN: 15.0000 [IU] | Freq: Every evening | SUBCUTANEOUS | 11 refills | Status: AC

## 2020-11-10 MED ORDER — DEXTROSE 50 % IV SOLN
INTRAVENOUS | Status: AC
  Administered 2020-11-10: 25 mL
  Filled 2020-11-10: qty 50

## 2020-11-10 MED ORDER — AMLODIPINE BESYLATE 10 MG PO TABS: 10.0000 mg | ORAL_TABLET | Freq: Every day | ORAL | 3 refills | Status: AC

## 2020-11-10 MED ORDER — VANCOMYCIN HCL 125 MG PO CAPS
125.0000 mg | ORAL_CAPSULE | Freq: Four times a day (QID) | ORAL | 0 refills | Status: AC
  Filled 2020-11-10: qty 32, 8d supply, fill #0

## 2020-11-10 MED ORDER — INSULIN GLARGINE 100 UNIT/ML ~~LOC~~ SOLN
14.0000 [IU] | Freq: Every day | SUBCUTANEOUS | Status: DC
  Filled 2020-11-10: qty 0.14

## 2020-11-10 NOTE — Progress Notes (Signed)
Occupational Therapy Treatment Patient Details Name: Christy Simmons MRN: [id number] DOB: 01-Mar-1935 Today's Date: 11/10/2020    History of present illness 86 y/o female presented to ED on 4/8 with AMS, sudden emesis, and weakness. MRI negative for stroke. Neurology believes toxic/metabolic encephalopathy in setting of elevated blood glucose. PMH: dementia, GERD, HTN, HLD, R MCA stroke 2016, type 2 DM   OT comments  Patient progressing towards goals. Today patient able to perform grooming and feeding with set up. Unsure of patient's PLOF - per chart patient has assistance for all ADLs - but unsure of patient's actually participation. Today patient able to follow all commands and exhibits the ability to assist with all UB ADLs and potentially with some LB ADLs. Will continue to follow acutely. Patient's family has refused SNF and wants to take patient home. Recommend [place] OT services to maximize patient's abilities.    Follow Up Recommendations  Home health OT    Equipment Recommendations  3 in 1 bedside commode    Recommendations for Other Services      Precautions / Restrictions Precautions Precautions: Fall Restrictions Weight Bearing Restrictions: No       Mobility Bed Mobility           Sit to supine: Mod assist   General bed mobility comments: assist to get legs into bed and to roll onto her back.    Transfers                      Balance Overall balance assessment: Needs assistance Sitting-balance support: No upper extremity supported;Feet supported Sitting balance-Leahy Scale: Fair Sitting balance - Comments: able to sit edge of bed without uE support                                   ADL either performed or assessed with clinical judgement   ADL Overall ADL's : Needs assistance/impaired Eating/Feeding: Set up Eating/Feeding Details (indicate cue type and reason): Patient demonstrates ability to feed herself apple sauce. Patient able to  assist with adding creamer to coffee and sip coffee from cup. Grooming: Set up Grooming Details (indicate cue type and reason): patient able to wash face and hands with setup                                     Vision Patient Visual Report: No change from baseline     Perception     Praxis      Cognition Arousal/Alertness: Awake/alert Behavior During Therapy: WFL for tasks assessed/performed Overall Cognitive Status: No family/caregiver present to determine baseline cognitive functioning                                 General Comments: Able to follow all commands. Hx of dementia        Exercises     Shoulder Instructions       General Comments      Pertinent Vitals/ Pain       Pain Assessment: Faces Faces Pain Scale: Hurts a little bit Pain Location: Rubbing right eye/head Pain Descriptors / Indicators: Grimacing Pain Intervention(s): Monitored during session  Home Living  Prior Functioning/Environment              Frequency  Min 2X/week        Progress Toward Goals  OT Goals(current goals can now be found in the care plan section)  Progress towards OT goals: Progressing toward goals  Acute Rehab OT Goals OT Goal Formulation: Patient unable to participate in goal setting Time For Goal Achievement: 11/20/20 Potential to Achieve Goals: Good  Plan Discharge plan needs to be updated    Co-evaluation          OT goals addressed during session: ADL's and self-care      AM-PAC OT "6 Clicks" Daily Activity     Outcome Measure   Help from another person eating meals?: A Little Help from another person taking care of personal grooming?: A Little Help from another person toileting, which includes using toliet, bedpan, or urinal?: Total Help from another person bathing (including washing, rinsing, drying)?: A Lot Help from another person to put on and taking  off regular upper body clothing?: A Little Help from another person to put on and taking off regular lower body clothing?: A Lot 6 Click Score: 14    End of Session    OT Visit Diagnosis: Unsteadiness on feet (R26.81);Muscle weakness (generalized) (M62.81);Other symptoms and signs involving cognitive function   Activity Tolerance Patient tolerated treatment well   Patient Left in bed;with call bell/phone within reach;with bed alarm set   Nurse Communication Mobility status        Time: 1115-1130 OT Time Calculation (min): 15 min  Charges: OT General Charges $OT Visit: 1 Visit OT Treatments $Self Care/Home Management : 8-22 mins  Derl Barrow, OTR/L [place]  Office [phone] Pager: [place] 11/10/2020, 12:32 PM

## 2020-11-10 NOTE — Progress Notes (Signed)
Physical Therapy Treatment Patient Details Name: Christy Simmons MRN: [id number] DOB: [birth year] Today's Date: 11/10/2020    History of Present Illness 86 y/o female presented to ED on 4/8 with AMS, sudden emesis, and weakness. MRI negative for stroke. Neurology believes toxic/metabolic encephalopathy in setting of elevated blood glucose. PMH: dementia, GERD, HTN, HLD, R MCA stroke 2016, type 2 DM    PT Comments    Per RN, MD requesting PT see pt given plan for d/c home as opposed to SNF. Per pt, at home family assists her in/out of bed, in/out of wheelchair, and with all ADLs. Pt is currently requiring mod assist for all bed mobility, transfer to standing, and lateral stepping. Per pt, she is mostly transfer and w/c level at this time. PT feels pt can d/c with 24/7 support from daughter, only if daughter can physically assist pt. PT to continue to follow acutely, recommending HHPT to address mobility deficits.    Follow Up Recommendations  Supervision/Assistance - 24 hour;Home health PT     Equipment Recommendations  Other (comment) (TBD)    Recommendations for Other Services       Precautions / Restrictions Precautions Precautions: Fall Restrictions Weight Bearing Restrictions: No    Mobility  Bed Mobility Overal bed mobility: Needs Assistance Bed Mobility: Supine to Sit     Supine to sit: Mod assist Sit to supine: Mod assist   General bed mobility comments: mod assist for LE translation to EOB, trunk elevation via HHA    Transfers Overall transfer level: Needs assistance Equipment used: 1 person hand held assist Transfers: Sit to/from [employer] Transfers Sit to Stand: Mod assist Stand pivot transfers: Mod assist       General transfer comment: mod assist for power up, rise, steadying. STS x4, from EOB x2 and BSC x2. Stand pivot to and from [facility] with mod assist to steady, guide pt trajectory.  Ambulation/Gait Ambulation/Gait assistance: Mod assist Gait  Distance (Feet): 3 Feet Assistive device: Rolling walker (2 wheeled) Gait Pattern/deviations: Step-to pattern;Trunk flexed;Decreased stride length;Shuffle Gait velocity: decr   General Gait Details: mod assist to steady, weight shift pt L/R for stepping. lateral steps towards [facility]   Stairs             Wheelchair Mobility    Modified Rankin (Stroke Patients Only)       Balance Overall balance assessment: Needs assistance Sitting-balance support: No upper extremity supported;Feet supported Sitting balance-Leahy Scale: Fair Sitting balance - Comments: able to sit edge of bed without UE support   Standing balance support: Bilateral upper extremity supported;During functional activity Standing balance-Leahy Scale: Poor                              Cognition Arousal/Alertness: Awake/alert Behavior During Therapy: WFL for tasks assessed/performed Overall Cognitive Status: No family/caregiver present to determine baseline cognitive functioning                                 General Comments: history of dementia, pt following one step commands with increased time and sometimes requires multimodal cuing. Pt limited in response time by fatigue      Exercises      General Comments        Pertinent Vitals/Pain Pain Assessment: Faces Faces Pain Scale: Hurts a little bit Pain Location: generalized Pain Descriptors / Indicators: Grimacing;Discomfort Pain Intervention(s): Limited activity  within patient's tolerance;Monitored during session;Repositioned    Home Living                      Prior Function            PT Goals (current goals can now be found in the care plan section) Acute Rehab PT Goals PT Goal Formulation: With patient Time For Goal Achievement: 11/20/20 Potential to Achieve Goals: Good Progress towards PT goals: Progressing toward goals    Frequency    Min 3X/week      PT Plan Discharge plan needs to be  updated    Co-evaluation       OT goals addressed during session: ADL's and self-care      AM-PAC PT "6 Clicks" Mobility   Outcome Measure  Help needed turning from your back to your side while in a flat bed without using bedrails?: A Lot Help needed moving from lying on your back to sitting on the side of a flat bed without using bedrails?: A Lot Help needed moving to and from a bed to a chair (including a wheelchair)?: A Lot Help needed standing up from a chair using your arms (e.g., wheelchair or bedside chair)?: A Lot Help needed to walk in hospital room?: A Lot Help needed climbing 3-5 steps with a railing? : Total 6 Click Score: 11    End of Session Equipment Utilized During Treatment: Gait belt Activity Tolerance: Patient tolerated treatment well Patient left: in bed;with call bell/phone within reach;Other (comment) (OT arrived to room) Nurse Communication: Mobility status PT Visit Diagnosis: Unsteadiness on feet (R26.81);Muscle weakness (generalized) (M62.81);Difficulty in walking, not elsewhere classified (R26.2)     Time: 7035-0093 PT Time Calculation (min) (ACUTE ONLY): 15 min  Charges:  $Therapeutic Activity: 8-22 mins                     Stacie Glaze, PT Acute Rehabilitation Services Pager [phone]  Office [phone]    Louis Matte 11/10/2020, 2:23 PM

## 2020-11-10 NOTE — Progress Notes (Signed)
Inpatient Diabetes Program Recommendations  AACE/ADA: New Consensus Statement on Inpatient Glycemic Control (2015)  Target Ranges:  Prepandial:   less than 140 mg/dL      Peak postprandial:   less than 180 mg/dL (1-2 hours)      Critically ill patients:  140 - 180 mg/dL   Lab Results  Component Value Date   GLUCAP 68 (L) 11/10/2020   HGBA1C 11.2 (H) 11/06/2020    Review of Glycemic Control Results for LOUANN, HOPSON (MRN [id number]) as of 11/10/2020 09:46  Ref. Range 11/10/2020 06:16 11/10/2020 06:48 11/10/2020 08:26  Glucose-Capillary Latest Ref Range: 70 - 99 mg/dL 45 (L) 112 (H) 68 (L)     Inpatient Diabetes Program Recommendations:     Lantus 15 units QHS  Will continue to follow while inpatient.  Thank you, Reche Dixon, RN, BSN Diabetes Coordinator Inpatient Diabetes Program [phone] (team pager from 8a-5p)
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
## 2020-11-10 NOTE — Discharge Summary (Signed)
Physician Discharge Summary  Christy Simmons ZOX:[id number] DOB: 1934-12-17 DOA: 11/05/2020  PCP: Cyndi Bender, PA-C  Admit date: 11/05/2020 Discharge date: 11/10/2020  Admitted From: Home  Disposition:  Home with [facility]   Recommendations for Outpatient Follow-up:  1. Follow up with PCP Cyndi Bender in 1-2 weeks 2. Ovid Curd: Please evaluate for improving Cdiff symptoms      Home Health: PT/OT due to imbalance with ambulation  Equipment/Devices: None new  Discharge Condition: Frail CODE STATUS: FULL Diet recommendation: Diabetic  Brief/Interim Summary: Christy Simmons is an 86 y.o. F with dementia, home dwelling, wheelchair dependent, hx MCA stroke s/p endovascular intervention, HTN, DM who presented with confusion and decreased mentation.  In the ER, she was severely hypertensive, had pyuria and had frequent stools.        PRINCIPAL HOSPITAL DIAGNOSIS: Acute metabolic encephalopathy due to UTI, C. difficile, and hypertensive encephalopathy    Discharge Diagnoses:  Acute metabolic encephalopathy Resolved to baseline at the time of discharge   UTI Treated with 5 days cephalosporin, treatment completed.  C. difficile diarrhea Started on 10-day vancomycin burst orally.  Discharged with 8 more days of vancomycin, stools improving and leukocytosis resolving by the time of discharge.  Hypertensive encephalopathy Hypertension Cerebrovascular disease Resolved with resuming home BP regimen   Hypokalemia Resolved with supplementation  Hyponatremia, due to dehydration   CKD IIIa AKI ruled out  DVT Continued on Eliquis  Diabetes, type II poorly controlled with chronic kidney disease A1c 11.2%            Discharge Instructions  Discharge Instructions    Discharge instructions   Complete by: As directed    From Dr. Loleta Books: You were admitted for confusion.  This was probably from a combination of high blood pressures, dehydration and bladder infection.  You  completed treatment for the bladder infection and dehydration here  For the blood pressure:  Start the new medicine amlodipine 10 mg daily Go see your primary care doctor as soon as you are able    For your diabetes: Reduce your lantus for the next few days and watch morning blood sugars Take only 15 units of Lantus (long acting insulin) instead of 25 If your blood sugars are HIGHER than 200 in the morning, increase the doses back to normal   Also:  While you were here, you developed C diff, a form of resistant diarrhea Take vancomycin 125 mg (2.5 mL) four times a day for the next 8 days   Increase activity slowly   Complete by: As directed    No wound care   Complete by: As directed      Allergies as of 11/10/2020      Reactions   Gabapentin Nausea And Vomiting   Codeine Other (See Comments)   Headache      Medication List    STOP taking these medications   oxyCODONE-acetaminophen 5-325 MG tablet Commonly known as: PERCOCET/ROXICET   traMADol 50 MG tablet Commonly known as: ULTRAM     TAKE these medications   amLODipine 10 MG tablet Commonly known as: NORVASC Take 1 tablet (10 mg total) by mouth daily. Start taking on: November 11, 2020   apixaban 2.5 MG Tabs tablet Commonly known as: Eliquis Take 1 tablet (2.5 mg total) by mouth 2 (two) times daily.   calcium carbonate 750 MG chewable tablet Commonly known as: TUMS EX Chew 750 mg by mouth daily as needed (indigestion).   ELDERBERRY PO Take 1 tablet by mouth daily.  GERITOL PO Take 1 tablet by mouth daily.   insulin aspart 100 UNIT/ML injection Commonly known as: novoLOG Inject 0-9 Units into the skin 3 (three) times daily with meals. What changed:   how much to take  additional instructions   insulin glargine 100 UNIT/ML injection Commonly known as: LANTUS Inject 0.15 mLs (15 Units total) into the skin every evening. What changed: how much to take   Melatonin 10 MG Caps Take 10 mg by mouth at  bedtime as needed for sleep.   sertraline 100 MG tablet Commonly known as: ZOLOFT Take 100 mg by mouth at bedtime.   vancomycin 50 mg/mL  oral solution Commonly known as: VANCOCIN Take 2.5 mLs (125 mg total) by mouth 4 (four) times daily for 8 days.   Vitamin B Complex Tabs Take 1 tablet by mouth daily.   vitamin C 100 MG tablet Take 100 mg by mouth daily.       Follow-up [name].   Specialty: [place] Why: The home health/ palliative care will contact you for the first home visit. Contact information: [place] [address] [phone]              Allergies  Allergen Reactions  . Gabapentin Nausea And Vomiting  . Codeine Other (See Comments)    Headache      Procedures/Studies: CT ANGIO HEAD W OR WO CONTRAST  Result Date: 11/05/2020 CLINICAL DATA:  Code stroke. Encephalopathy. Left-sided facial droop. EXAM: CT ANGIOGRAPHY HEAD AND NECK TECHNIQUE: Multidetector CT imaging of the head and neck was performed using the standard protocol during bolus administration of intravenous contrast. Multiplanar CT image reconstructions and MIPs were obtained to evaluate the vascular anatomy. Carotid stenosis measurements (when applicable) are obtained utilizing NASCET criteria, using the distal internal carotid diameter as the denominator. CONTRAST:  18mL OMNIPAQUE IOHEXOL 350 MG/ML SOLN COMPARISON:  Head CT 11/05/2020 FINDINGS: CTA NECK FINDINGS SKELETON: There is no bony spinal canal stenosis. No lytic or blastic lesion. OTHER NECK: Normal pharynx, larynx and major salivary glands. No cervical lymphadenopathy. UPPER CHEST: No pneumothorax or pleural effusion. No nodules or masses. AORTIC ARCH: There is calcific atherosclerosis of the aortic arch. There is no aneurysm, dissection or hemodynamically significant stenosis of the visualized portion of the aorta. Conventional 3 vessel aortic branching pattern. The  visualized proximal subclavian arteries are widely patent. RIGHT CAROTID SYSTEM: No dissection, occlusion or aneurysm. There is mixed density atherosclerosis extending into the proximal ICA, resulting in less than 50% stenosis. LEFT CAROTID SYSTEM: No dissection, occlusion or aneurysm. There is mixed density atherosclerosis extending into the proximal ICA, resulting in less than 50% stenosis. VERTEBRAL ARTERIES: Left dominant configuration. Both origins are clearly patent. Mild stenosis of the right V3 segments and distal left V2 segment. CTA HEAD FINDINGS POSTERIOR CIRCULATION: --Vertebral arteries: Normal V4 segments. --Inferior cerebellar arteries: Normal. --Basilar artery: Normal. --Superior cerebellar arteries: Normal. --Posterior cerebral arteries (PCA): Moderate stenosis of the distal left P2 segment. Normal right. There are small bilateral P comms. ANTERIOR CIRCULATION: --Intracranial internal carotid arteries: Normal. --Anterior cerebral arteries (ACA): Normal. Both A1 segments are present. Patent anterior communicating artery (a-comm). --Middle cerebral arteries (MCA): Normal. VENOUS SINUSES: As permitted by contrast timing, patent. ANATOMIC VARIANTS: None Review of the MIP images confirms the above findings. IMPRESSION: 1. No emergent large vessel occlusion. 2. Moderate stenosis of the distal left PCA P2 segment. 3. Mild stenosis of the right vertebral artery V3 segment  and distal left V2 segment. 4. Bilateral carotid bifurcation atherosclerosis without hemodynamically significant stenosis by NASCET criteria. Aortic Atherosclerosis (ICD10-I70.0). Electronically Signed   By: Ulyses Jarred M.D.   On: 11/05/2020 20:43   DG Chest 2 View  Result Date: 11/06/2020 CLINICAL DATA:  Vomiting EXAM: CHEST - 2 VIEW COMPARISON:  08/09/2018 FINDINGS: No focal opacity or pleural effusion. Stable cardiomediastinal silhouette with aortic atherosclerosis. No pneumothorax. Old left-sided rib fractures IMPRESSION: No  active cardiopulmonary disease. Electronically Signed   By: Donavan Foil M.D.   On: 11/06/2020 01:46   CT ANGIO NECK W OR WO CONTRAST  Result Date: 11/05/2020 CLINICAL DATA:  Code stroke. Encephalopathy. Left-sided facial droop. EXAM: CT ANGIOGRAPHY HEAD AND NECK TECHNIQUE: Multidetector CT imaging of the head and neck was performed using the standard protocol during bolus administration of intravenous contrast. Multiplanar CT image reconstructions and MIPs were obtained to evaluate the vascular anatomy. Carotid stenosis measurements (when applicable) are obtained utilizing NASCET criteria, using the distal internal carotid diameter as the denominator. CONTRAST:  16mL OMNIPAQUE IOHEXOL 350 MG/ML SOLN COMPARISON:  Head CT 11/05/2020 FINDINGS: CTA NECK FINDINGS SKELETON: There is no bony spinal canal stenosis. No lytic or blastic lesion. OTHER NECK: Normal pharynx, larynx and major salivary glands. No cervical lymphadenopathy. UPPER CHEST: No pneumothorax or pleural effusion. No nodules or masses. AORTIC ARCH: There is calcific atherosclerosis of the aortic arch. There is no aneurysm, dissection or hemodynamically significant stenosis of the visualized portion of the aorta. Conventional 3 vessel aortic branching pattern. The visualized proximal subclavian arteries are widely patent. RIGHT CAROTID SYSTEM: No dissection, occlusion or aneurysm. There is mixed density atherosclerosis extending into the proximal ICA, resulting in less than 50% stenosis. LEFT CAROTID SYSTEM: No dissection, occlusion or aneurysm. There is mixed density atherosclerosis extending into the proximal ICA, resulting in less than 50% stenosis. VERTEBRAL ARTERIES: Left dominant configuration. Both origins are clearly patent. Mild stenosis of the right V3 segments and distal left V2 segment. CTA HEAD FINDINGS POSTERIOR CIRCULATION: --Vertebral arteries: Normal V4 segments. --Inferior cerebellar arteries: Normal. --Basilar artery: Normal.  --Superior cerebellar arteries: Normal. --Posterior cerebral arteries (PCA): Moderate stenosis of the distal left P2 segment. Normal right. There are small bilateral P comms. ANTERIOR CIRCULATION: --Intracranial internal carotid arteries: Normal. --Anterior cerebral arteries (ACA): Normal. Both A1 segments are present. Patent anterior communicating artery (a-comm). --Middle cerebral arteries (MCA): Normal. VENOUS SINUSES: As permitted by contrast timing, patent. ANATOMIC VARIANTS: None Review of the MIP images confirms the above findings. IMPRESSION: 1. No emergent large vessel occlusion. 2. Moderate stenosis of the distal left PCA P2 segment. 3. Mild stenosis of the right vertebral artery V3 segment and distal left V2 segment. 4. Bilateral carotid bifurcation atherosclerosis without hemodynamically significant stenosis by NASCET criteria. Aortic Atherosclerosis (ICD10-I70.0). Electronically Signed   By: Ulyses Jarred M.D.   On: 11/05/2020 20:43   MR BRAIN WO CONTRAST  Result Date: 11/05/2020 CLINICAL DATA:  Initial evaluation for neuro deficit, stroke suspected. EXAM: MRI HEAD WITHOUT CONTRAST TECHNIQUE: Multiplanar, multiecho pulse sequences of the brain and surrounding structures were obtained without intravenous contrast. COMPARISON:  Prior CTs from earlier the same day. FINDINGS: Brain: Examination moderately degraded by motion artifact. Diffuse prominence of the CSF containing spaces compatible with generalized cerebral atrophy. Patchy and confluent T2/FLAIR hyperintensity within the periventricular and deep white matter both cerebral hemispheres most consistent with chronic small vessel ischemic disease, moderate in nature. Remote lacunar infarct present at the right basal ganglia/external capsule. Few additional scattered remote lacunar  infarcts noted about the bilateral basal ganglia and thalami. Chronic posterior left MCA distribution infarct noted at the left parietal lobe. Multiple small chronic  bilateral cerebellar infarcts. No abnormal foci of restricted suggest acute or subacute ischemia. Gray-white matter differentiation otherwise maintained. No visible foci of susceptibility artifact to suggest acute or chronic intracranial hemorrhage on this motion degraded exam. No mass lesion, midline shift or mass effect. No hydrocephalus or extra-axial fluid collection. Pituitary gland suprasellar region within normal limits. Midline structures intact. Vascular: Major intracranial vascular flow voids are maintained. Skull and upper cervical spine: Craniocervical junction within normal limits. Bone marrow signal intensity normal. No scalp soft tissue abnormality. Sinuses/Orbits: Patient status post bilateral ocular lens replacement. Globes and orbital soft tissues demonstrate no acute finding. Paranasal sinuses are clear. Small bilateral mastoid effusions, of doubtful significance. Inner ear structures grossly normal. Other: None. IMPRESSION: 1. No acute intracranial abnormality. 2. Chronic left MCA distribution infarct, with additional remote lacunar infarcts involving the bilateral basal ganglia and thalami, and cerebellum. 3. Underlying moderately advanced cerebral atrophy with chronic small vessel ischemic disease. Electronically Signed   By: Jeannine Boga M.D.   On: 11/05/2020 23:21   CT HEAD CODE STROKE WO CONTRAST  Result Date: 11/05/2020 CLINICAL DATA:  Code stroke.  Left-sided facial droop EXAM: CT HEAD WITHOUT CONTRAST TECHNIQUE: Contiguous axial images were obtained from the base of the skull through the vertex without intravenous contrast. COMPARISON:  10/19/2014 FINDINGS: Brain: There is no mass, hemorrhage or extra-axial collection. Diffuse severe atrophy. There is hypoattenuation of the periventricular white matter, most commonly indicating chronic ischemic microangiopathy. There are multiple old infarcts, including the left parietal lobe and right basal ganglia. Vascular: No abnormal  hyperdensity of the major intracranial arteries or dural venous sinuses. No intracranial atherosclerosis. Skull: The visualized skull base, calvarium and extracranial soft tissues are normal. Sinuses/Orbits: No fluid levels or advanced mucosal thickening of the visualized paranasal sinuses. No mastoid or middle ear effusion. The orbits are normal. ASPECTS [facility] Stroke Program Early CT Score) - Ganglionic level infarction (caudate, lentiform nuclei, internal capsule, insula, M1-M3 cortex): 7 - Supraganglionic infarction (M4-M6 cortex): 3 Total score (0-10 with 10 being normal): 10 IMPRESSION: 1. No hemorrhage. 2. ASPECTS is 10. 3. Diffuse severe atrophy and chronic ischemic microangiopathy. 4. Multiple old infarcts. These results were communicated to Dr. Lesleigh Noe at 8:25 pm on 11/05/2020 by text page via the [facility] messaging system. Electronically Signed   By: Ulyses Jarred M.D.   On: 11/05/2020 20:26       Subjective: Only 2 stools in the last 24 hours, no abdominal pain, no fever, no cough, no respiratory distress.  Discharge Exam: Vitals:   11/10/20 0600 11/10/20 0822  BP:  (!) 150/66  Pulse:  60  Resp:  16  Temp: (!) 97.4 F (36.3 C) (!) 97.5 F (36.4 C)  SpO2:  96%   Vitals:   11/10/20 0026 11/10/20 0444 11/10/20 0600 11/10/20 0822  BP: (!) 165/70 (!) 177/74  (!) 150/66  Pulse: 65 70  60  Resp: 16 16  16   Temp: (!) 97.4 F (36.3 C)  (!) 97.4 F (36.3 C) (!) 97.5 F (36.4 C)  TempSrc: Oral  Oral Axillary  SpO2: 96% 95%  96%  Weight:      Height:        General: Pt is alert, awake, not in acute distress, sitting up in bed, eating breakfast. Cardiovascular: RRR, nl S1-S2, no murmurs appreciated.   No LE edema.  Respiratory: Normal respiratory rate and rhythm.  CTAB without rales or wheezes. Abdominal: Abdomen soft and non-tender.  No distension or HSM.   Neuro/Psych: Strength symmetric in upper and lower extremities, but generalized weakness noted.  Judgment and insight  appear impaired.   The results of significant diagnostics from this hospitalization (including imaging, microbiology, ancillary and laboratory) are listed below for reference.     Microbiology: Recent Results (from the past 240 hour(s))  Resp Panel by RT-PCR (Flu A&B, Covid) Nasopharyngeal Swab     Status: None   Collection Time: 11/05/20 11:40 PM   Specimen: Nasopharyngeal Swab; Nasopharyngeal(NP) swabs in vial transport medium  Result Value Ref Range Status   SARS Coronavirus 2 by RT PCR NEGATIVE NEGATIVE Final    Comment: (NOTE) SARS-CoV-2 target nucleic acids are NOT DETECTED.  The SARS-CoV-2 RNA is generally detectable in upper respiratory specimens during the acute phase of infection. The lowest concentration of SARS-CoV-2 viral copies this assay can detect is 138 copies/mL. A negative result does not preclude SARS-Cov-2 infection and should not be used as the sole basis for treatment or other patient management decisions. A negative result may occur with  improper specimen collection/handling, submission of specimen other than nasopharyngeal swab, presence of viral mutation(s) within the areas targeted by this assay, and inadequate number of viral copies(<138 copies/mL). A negative result must be combined with clinical observations, patient history, and epidemiological information. The expected result is Negative.  Fact Sheet for Patients:  EntrepreneurPulse.com.au  Fact Sheet for Healthcare Providers:  IncredibleEmployment.be  This test is no t yet approved or cleared by the [place] FDA and  has been authorized for detection and/or diagnosis of SARS-CoV-2 by FDA under an Emergency Use Authorization (EUA). This EUA will remain  in effect (meaning this test can be used) for the duration of the COVID-19 declaration under Section 564(b)(1) of the Act, 21 U.S.C.section 360bbb-3(b)(1), unless the authorization is terminated  or  revoked sooner.       Influenza A by PCR NEGATIVE NEGATIVE Final   Influenza B by PCR NEGATIVE NEGATIVE Final    Comment: (NOTE) The Xpert Xpress SARS-CoV-2/FLU/RSV plus assay is intended as an aid in the diagnosis of influenza from Nasopharyngeal swab specimens and should not be used as a sole basis for treatment. Nasal washings and aspirates are unacceptable for Xpert Xpress SARS-CoV-2/FLU/RSV testing.  Fact Sheet for Patients: EntrepreneurPulse.com.au  Fact Sheet for Healthcare Providers: IncredibleEmployment.be  This test is not yet approved or cleared by the [place] FDA and has been authorized for detection and/or diagnosis of SARS-CoV-2 by FDA under an Emergency Use Authorization (EUA). This EUA will remain in effect (meaning this test can be used) for the duration of the COVID-19 declaration under Section 564(b)(1) of the Act, 21 U.S.C. section 360bbb-3(b)(1), unless the authorization is terminated or revoked.  Performed at [facility], [place] [address], [place], [place] [address]   Culture, Urine     Status: Abnormal   Collection Time: 11/06/20  4:04 AM   Specimen: Urine, Random  Result Value Ref Range Status   Specimen Description URINE, RANDOM  Final   Special Requests   Final    NONE Performed at [facility], [place] [address], [place], [place] [address]    Culture >=100,000 COLONIES/mL ESCHERICHIA COLI (A)  Final   Report Status 11/08/2020 FINAL  Final   Organism ID, Bacteria ESCHERICHIA COLI (A)  Final      Susceptibility   Escherichia coli -  MIC*    AMPICILLIN 4 SENSITIVE Sensitive     CEFAZOLIN <=4 SENSITIVE Sensitive     CEFEPIME <=0.12 SENSITIVE Sensitive     CEFTRIAXONE <=0.25 SENSITIVE Sensitive     CIPROFLOXACIN >=4 RESISTANT Resistant     GENTAMICIN <=1 SENSITIVE Sensitive     IMIPENEM <=0.25 SENSITIVE Sensitive     NITROFURANTOIN <=16 SENSITIVE Sensitive     TRIMETH/SULFA <=20 SENSITIVE  Sensitive     AMPICILLIN/SULBACTAM <=2 SENSITIVE Sensitive     PIP/TAZO <=4 SENSITIVE Sensitive     * >=100,000 COLONIES/mL ESCHERICHIA COLI  C Difficile Quick Screen w PCR reflex     Status: Abnormal   Collection Time: 11/07/20  4:31 PM   Specimen: STOOL  Result Value Ref Range Status   C Diff antigen POSITIVE (A) NEGATIVE Final   C Diff toxin NEGATIVE NEGATIVE Final   C Diff interpretation Results are indeterminate. See PCR results.  Final    Comment: Performed at [facility], [place] [address], [place], [place] [address]  C. Diff by PCR, Reflexed     Status: Abnormal   Collection Time: 11/07/20  4:31 PM  Result Value Ref Range Status   Toxigenic C. Difficile by PCR POSITIVE (A) NEGATIVE Final    Comment: Positive for toxigenic C. difficile with little to no toxin production. Only treat if clinical presentation suggests symptomatic illness. Performed at [facility], [place] [address], [place], [place] [address]      Labs: BNP (last 3 results) No results for input(s): BNP in the last 8760 hours. Basic Metabolic Panel: Recent Labs  Lab 11/05/20 2053 11/06/20 0240 11/06/20 1556 11/07/20 0246 11/08/20 0307 11/09/20 1253  NA 130* 133*  --  136 136 137  K 3.2* 2.9* 3.3* 3.2* 3.6 3.3*  CL 93* 97*  --  107 107 109  CO2 24 26  --  22 23 23   GLUCOSE 348* 113*  --  50* 136* 294*  BUN 28* 25*  --  26* 24* 26*  CREATININE 1.32* 1.06*  --  1.18* 1.17* 1.32*  CALCIUM 9.8 9.7  --  9.0 8.5* 8.4*  MG  --  1.7  --  1.7  --   --    Liver Function Tests: Recent Labs  Lab 11/05/20 2053  AST 23  ALT 17  ALKPHOS 62  BILITOT 0.7  PROT 5.9*  ALBUMIN 3.0*   Recent Labs  Lab 11/06/20 0240  LIPASE 31   Recent Labs  Lab 11/06/20 0242  AMMONIA 22   CBC: Recent Labs  Lab 11/05/20 2013 11/05/20 2017 11/06/20 0240 11/07/20 0246 11/09/20 1253  WBC 14.0*  --  12.7* 11.0* 8.6  NEUTROABS 10.0*  --   --  7.2 5.6  HGB 14.4 14.3 12.7 10.8* 10.9*  HCT 42.7 42.0 36.1 31.0*  33.0*  MCV 86.6  --  82.8 84.5 88.2  PLT 266  --  275 240 239   Cardiac Enzymes: No results for input(s): CKTOTAL, CKMB, CKMBINDEX, TROPONINI in the last 168 hours. BNP: Invalid input(s): POCBNP CBG: Recent Labs  Lab 11/09/20 2104 11/10/20 0616 11/10/20 0648 11/10/20 0826 11/10/20 1103  GLUCAP 183* 45* 112* 68* 135*   D-Dimer No results for input(s): DDIMER in the last 72 hours. Hgb A1c No results for input(s): HGBA1C in the last 72 hours. Lipid Profile No results for input(s): CHOL, HDL, LDLCALC, TRIG, CHOLHDL, LDLDIRECT in the last 72 hours. Thyroid function studies No results for input(s): TSH, T4TOTAL, T3FREE, THYROIDAB  in the last 72 hours.  Invalid input(s): FREET3 Anemia work up No results for input(s): VITAMINB12, FOLATE, FERRITIN, TIBC, IRON, RETICCTPCT in the last 72 hours. Urinalysis    Component Value Date/Time   COLORURINE YELLOW 11/05/2020 2330   APPEARANCEUR CLOUDY (A) 11/05/2020 2330   LABSPEC 1.043 (H) 11/05/2020 2330   PHURINE 5.0 11/05/2020 2330   GLUCOSEU >=500 (A) 11/05/2020 2330   HGBUR SMALL (A) 11/05/2020 2330   BILIRUBINUR NEGATIVE 11/05/2020 2330   KETONESUR 5 (A) 11/05/2020 2330   PROTEINUR >=300 (A) 11/05/2020 2330   UROBILINOGEN 1.0 10/20/2014 0031   NITRITE NEGATIVE 11/05/2020 2330   LEUKOCYTESUR TRACE (A) 11/05/2020 2330   Sepsis Labs Invalid input(s): PROCALCITONIN,  WBC,  LACTICIDVEN Microbiology Recent Results (from the past 240 hour(s))  Resp Panel by RT-PCR (Flu A&B, Covid) Nasopharyngeal Swab     Status: None   Collection Time: 11/05/20 11:40 PM   Specimen: Nasopharyngeal Swab; Nasopharyngeal(NP) swabs in vial transport medium  Result Value Ref Range Status   SARS Coronavirus 2 by RT PCR NEGATIVE NEGATIVE Final    Comment: (NOTE) SARS-CoV-2 target nucleic acids are NOT DETECTED.  The SARS-CoV-2 RNA is generally detectable in upper respiratory specimens during the acute phase of infection. The lowest concentration of  SARS-CoV-2 viral copies this assay can detect is 138 copies/mL. A negative result does not preclude SARS-Cov-2 infection and should not be used as the sole basis for treatment or other patient management decisions. A negative result may occur with  improper specimen collection/handling, submission of specimen other than nasopharyngeal swab, presence of viral mutation(s) within the areas targeted by this assay, and inadequate number of viral copies(<138 copies/mL). A negative result must be combined with clinical observations, patient history, and epidemiological information. The expected result is Negative.  Fact Sheet for Patients:  EntrepreneurPulse.com.au  Fact Sheet for Healthcare Providers:  IncredibleEmployment.be  This test is no t yet approved or cleared by the [place] FDA and  has been authorized for detection and/or diagnosis of SARS-CoV-2 by FDA under an Emergency Use Authorization (EUA). This EUA will remain  in effect (meaning this test can be used) for the duration of the COVID-19 declaration under Section 564(b)(1) of the Act, 21 U.S.C.section 360bbb-3(b)(1), unless the authorization is terminated  or revoked sooner.       Influenza A by PCR NEGATIVE NEGATIVE Final   Influenza B by PCR NEGATIVE NEGATIVE Final    Comment: (NOTE) The Xpert Xpress SARS-CoV-2/FLU/RSV plus assay is intended as an aid in the diagnosis of influenza from Nasopharyngeal swab specimens and should not be used as a sole basis for treatment. Nasal washings and aspirates are unacceptable for Xpert Xpress SARS-CoV-2/FLU/RSV testing.  Fact Sheet for Patients: EntrepreneurPulse.com.au  Fact Sheet for Healthcare Providers: IncredibleEmployment.be  This test is not yet approved or cleared by the [place] FDA and has been authorized for detection and/or diagnosis of SARS-CoV-2 by FDA under an Emergency Use  Authorization (EUA). This EUA will remain in effect (meaning this test can be used) for the duration of the COVID-19 declaration under Section 564(b)(1) of the Act, 21 U.S.C. section 360bbb-3(b)(1), unless the authorization is terminated or revoked.  Performed at [facility], [place] [address] [place], [place] [address]   Culture, Urine     Status: Abnormal   Collection Time: 11/06/20  4:04 AM   Specimen: Urine, Random  Result Value Ref Range Status   Specimen Description URINE, RANDOM  Final   Special Requests  Final    NONE Performed at [facility], [place] [address] [place], [place] [address]    Culture >=100,000 COLONIES/mL ESCHERICHIA COLI (A)  Final   Report Status 11/08/2020 FINAL  Final   Organism ID, Bacteria ESCHERICHIA COLI (A)  Final      Susceptibility   Escherichia coli - MIC*    AMPICILLIN 4 SENSITIVE Sensitive     CEFAZOLIN <=4 SENSITIVE Sensitive     CEFEPIME <=0.12 SENSITIVE Sensitive     CEFTRIAXONE <=0.25 SENSITIVE Sensitive     CIPROFLOXACIN >=4 RESISTANT Resistant     GENTAMICIN <=1 SENSITIVE Sensitive     IMIPENEM <=0.25 SENSITIVE Sensitive     NITROFURANTOIN <=16 SENSITIVE Sensitive     TRIMETH/SULFA <=20 SENSITIVE Sensitive     AMPICILLIN/SULBACTAM <=2 SENSITIVE Sensitive     PIP/TAZO <=4 SENSITIVE Sensitive     * >=100,000 COLONIES/mL ESCHERICHIA COLI  C Difficile Quick Screen w PCR reflex     Status: Abnormal   Collection Time: 11/07/20  4:31 PM   Specimen: STOOL  Result Value Ref Range Status   C Diff antigen POSITIVE (A) NEGATIVE Final   C Diff toxin NEGATIVE NEGATIVE Final   C Diff interpretation Results are indeterminate. See PCR results.  Final    Comment: Performed at [facility], [place] [address], [place], [place] [address]  C. Diff by PCR, Reflexed     Status: Abnormal   Collection Time: 11/07/20  4:31 PM  Result Value Ref Range Status   Toxigenic C. Difficile by PCR POSITIVE (A) NEGATIVE Final    Comment: Positive for  toxigenic C. difficile with little to no toxin production. Only treat if clinical presentation suggests symptomatic illness. Performed at [facility], [place] [address] [place], [place] [address]      Time coordinating discharge: 35 minutes     30 Day Unplanned Readmission Risk Score   Flowsheet Row ED to Hosp-Admission (Current) from 11/05/2020 in [facility]  30 Day Unplanned Readmission Risk Score (%) 18.03 Filed at 11/10/2020 0801     This score is the patient's risk of an unplanned readmission within 30 days of being discharged (0 -100%). The score is based on dignosis, age, lab data, medications, orders, and past utilization.   Low:  0-14.9   Medium: 15-21.9   High: 22-29.9   Extreme: 30 and above           SIGNED:   Edwin Dada, MD  [facility] 11/10/2020, 11:06 AM

## 2020-11-10 NOTE — TOC Transition Note (Signed)
Transition of Care [facility]) - CM/SW Discharge Note   Patient Details  Name: Christy Simmons MRN: [id number] Date of Birth: September 14, 1934  Transition of Care [facility]) CM/SW Contact:  Pollie Friar, RN Phone Number: 11/10/2020, 11:55 AM   Clinical Narrative:    Patient is discharging home with [facility] services and palliative services through [facility] and hospice. CM has updated [facility] on her d/c home today. CM has spoke with daughter and she is aware of d/c and will be here to provide transport home for her mother between 1:30-2 pm. Bedside RN updated.    Final next level of care: Home w Home Health Services Barriers to Discharge: No Barriers Identified   Patient Goals and CMS Choice   CMS Medicare.gov Compare Post Acute Care list provided to:: Patient Represenative (must comment) Choice offered to / list presented to : Adult Children  Discharge Placement                       Discharge Plan and Services   Discharge Planning Services: CM Consult Post Acute Care Choice: Home Health                    HH Arranged: PT,OT,Nurse's Aide [place]: [place] Date [facility] Agency Contacted: 11/08/20   Representative spoke with at [place]: Vicky--palliative form also faxed in  Social Determinants of Health ([place]) Interventions     Readmission Risk Interventions No flowsheet data found.

## 2021-10-29 DEATH — deceased
